# Patient Record
Sex: Female | Born: 1970 | Race: White | Hispanic: No | Marital: Single | Smoking: Former smoker
Health system: Southern US, Community
[De-identification: ages and names within clinical notes are randomized; demographics above are authoritative.]

## PROBLEM LIST (undated history)

## (undated) DIAGNOSIS — D649 Anemia, unspecified: Secondary | ICD-10-CM

## (undated) DIAGNOSIS — F329 Major depressive disorder, single episode, unspecified: Secondary | ICD-10-CM

## (undated) DIAGNOSIS — M1990 Unspecified osteoarthritis, unspecified site: Secondary | ICD-10-CM

## (undated) DIAGNOSIS — M549 Dorsalgia, unspecified: Secondary | ICD-10-CM

## (undated) DIAGNOSIS — R51 Headache: Secondary | ICD-10-CM

## (undated) DIAGNOSIS — K5090 Crohn's disease, unspecified, without complications: Secondary | ICD-10-CM

## (undated) DIAGNOSIS — G40909 Epilepsy, unspecified, not intractable, without status epilepticus: Secondary | ICD-10-CM

## (undated) DIAGNOSIS — J189 Pneumonia, unspecified organism: Secondary | ICD-10-CM

## (undated) DIAGNOSIS — F419 Anxiety disorder, unspecified: Secondary | ICD-10-CM

## (undated) DIAGNOSIS — K219 Gastro-esophageal reflux disease without esophagitis: Secondary | ICD-10-CM

## (undated) DIAGNOSIS — IMO0002 Reserved for concepts with insufficient information to code with codable children: Secondary | ICD-10-CM

## (undated) DIAGNOSIS — G8929 Other chronic pain: Secondary | ICD-10-CM

## (undated) DIAGNOSIS — E1165 Type 2 diabetes mellitus with hyperglycemia: Secondary | ICD-10-CM

## (undated) DIAGNOSIS — G43909 Migraine, unspecified, not intractable, without status migrainosus: Secondary | ICD-10-CM

## (undated) DIAGNOSIS — F32A Depression, unspecified: Secondary | ICD-10-CM

## (undated) DIAGNOSIS — G35 Multiple sclerosis: Secondary | ICD-10-CM

## (undated) DIAGNOSIS — R519 Headache, unspecified: Secondary | ICD-10-CM

## (undated) DIAGNOSIS — G35D Multiple sclerosis, unspecified: Secondary | ICD-10-CM

---

## 1991-03-03 HISTORY — PX: TUBAL LIGATION: SHX77

## 1992-03-02 HISTORY — PX: CHOLECYSTECTOMY: SHX55

## 1998-09-18 ENCOUNTER — Emergency Department (HOSPITAL_COMMUNITY): Admission: EM | Admit: 1998-09-18 | Discharge: 1998-09-18 | Payer: Self-pay | Admitting: *Deleted

## 1998-09-18 ENCOUNTER — Encounter: Payer: Self-pay | Admitting: Emergency Medicine

## 1999-09-24 ENCOUNTER — Encounter: Payer: Self-pay | Admitting: *Deleted

## 1999-09-24 ENCOUNTER — Inpatient Hospital Stay (HOSPITAL_COMMUNITY): Admission: EM | Admit: 1999-09-24 | Discharge: 1999-09-27 | Payer: Self-pay | Admitting: Emergency Medicine

## 1999-10-01 ENCOUNTER — Inpatient Hospital Stay (HOSPITAL_COMMUNITY): Admission: EM | Admit: 1999-10-01 | Discharge: 1999-10-03 | Payer: Self-pay | Admitting: Emergency Medicine

## 1999-10-02 ENCOUNTER — Encounter: Payer: Self-pay | Admitting: *Deleted

## 2001-06-10 ENCOUNTER — Inpatient Hospital Stay (HOSPITAL_COMMUNITY): Admission: AD | Admit: 2001-06-10 | Discharge: 2001-06-10 | Payer: Self-pay | Admitting: Obstetrics and Gynecology

## 2004-02-07 ENCOUNTER — Emergency Department (HOSPITAL_COMMUNITY): Admission: EM | Admit: 2004-02-07 | Discharge: 2004-02-07 | Payer: Self-pay | Admitting: *Deleted

## 2012-09-21 ENCOUNTER — Encounter (HOSPITAL_COMMUNITY): Payer: Self-pay | Admitting: Emergency Medicine

## 2012-09-21 ENCOUNTER — Emergency Department (HOSPITAL_COMMUNITY): Payer: Medicaid Other

## 2012-09-21 ENCOUNTER — Observation Stay (HOSPITAL_COMMUNITY)
Admission: EM | Admit: 2012-09-21 | Discharge: 2012-09-26 | Disposition: A | Discharge status: Home / Self Care | Payer: Medicaid Other | Attending: Internal Medicine | Admitting: Internal Medicine

## 2012-09-21 DIAGNOSIS — Z6832 Body mass index (BMI) 32.0-32.9, adult: Secondary | ICD-10-CM | POA: Insufficient documentation

## 2012-09-21 DIAGNOSIS — R0602 Shortness of breath: Secondary | ICD-10-CM | POA: Insufficient documentation

## 2012-09-21 DIAGNOSIS — Z91199 Patient's noncompliance with other medical treatment and regimen due to unspecified reason: Secondary | ICD-10-CM

## 2012-09-21 DIAGNOSIS — F17200 Nicotine dependence, unspecified, uncomplicated: Secondary | ICD-10-CM | POA: Insufficient documentation

## 2012-09-21 DIAGNOSIS — Z720 Tobacco use: Secondary | ICD-10-CM

## 2012-09-21 DIAGNOSIS — G8929 Other chronic pain: Secondary | ICD-10-CM | POA: Insufficient documentation

## 2012-09-21 DIAGNOSIS — M4800 Spinal stenosis, site unspecified: Secondary | ICD-10-CM | POA: Insufficient documentation

## 2012-09-21 DIAGNOSIS — G35 Multiple sclerosis: Secondary | ICD-10-CM | POA: Diagnosis present

## 2012-09-21 DIAGNOSIS — R0789 Other chest pain: Principal | ICD-10-CM | POA: Diagnosis present

## 2012-09-21 DIAGNOSIS — Z79899 Other long term (current) drug therapy: Secondary | ICD-10-CM | POA: Insufficient documentation

## 2012-09-21 DIAGNOSIS — E43 Unspecified severe protein-calorie malnutrition: Secondary | ICD-10-CM | POA: Insufficient documentation

## 2012-09-21 DIAGNOSIS — Z9119 Patient's noncompliance with other medical treatment and regimen: Secondary | ICD-10-CM

## 2012-09-21 DIAGNOSIS — E119 Type 2 diabetes mellitus without complications: Secondary | ICD-10-CM

## 2012-09-21 HISTORY — DX: Type 2 diabetes mellitus with hyperglycemia: E11.65

## 2012-09-21 HISTORY — DX: Multiple sclerosis: G35

## 2012-09-21 HISTORY — DX: Unspecified osteoarthritis, unspecified site: M19.90

## 2012-09-21 HISTORY — DX: Reserved for concepts with insufficient information to code with codable children: IMO0002

## 2012-09-21 HISTORY — DX: Multiple sclerosis, unspecified: G35.D

## 2012-09-21 LAB — COMPREHENSIVE METABOLIC PANEL
Albumin: 2.9 g/dL — ABNORMAL LOW (ref 3.5–5.2)
Alkaline Phosphatase: 89 U/L (ref 39–117)
BUN: 5 mg/dL — ABNORMAL LOW (ref 6–23)
CO2: 22 mEq/L (ref 19–32)
Chloride: 97 mEq/L (ref 96–112)
Creatinine, Ser: 0.39 mg/dL — ABNORMAL LOW (ref 0.50–1.10)
GFR calc Af Amer: >90 mL/min (ref >90)
GFR calc non Af Amer: >90 mL/min (ref >90)
Glucose, Bld: 322 mg/dL — ABNORMAL HIGH (ref 70–99)
Potassium: 3.5 mEq/L (ref 3.5–5.1)
Total Bilirubin: 0.3 mg/dL (ref 0.3–1.2)

## 2012-09-21 LAB — CBC
HCT: 35.1 % — ABNORMAL LOW (ref 36.0–46.0)
Hemoglobin: 11.4 g/dL — ABNORMAL LOW (ref 12.0–15.0)
MCV: 71.5 fL — ABNORMAL LOW (ref 78.0–100.0)
RDW: 16.2 % — ABNORMAL HIGH (ref 11.5–15.5)
WBC: 8.3 10*3/uL (ref 4.0–10.5)

## 2012-09-21 LAB — PRO B NATRIURETIC PEPTIDE: Pro B Natriuretic peptide (BNP): 11.2 pg/mL (ref 0–125)

## 2012-09-21 LAB — POCT I-STAT 3, ART BLOOD GAS (G3+)
Acid-Base Excess: 2 mmol/L (ref 0.0–2.0)
Bicarbonate: 26 mEq/L — ABNORMAL HIGH (ref 20.0–24.0)
Patient temperature: 37.2
TCO2: 27 mmol/L (ref 0–100)
pO2, Arterial: 113 mmHg — ABNORMAL HIGH (ref 80.0–100.0)

## 2012-09-21 LAB — D-DIMER, QUANTITATIVE: D-Dimer, Quant: 1.76 ug/mL-FEU — ABNORMAL HIGH (ref 0.00–0.48)

## 2012-09-21 LAB — POCT I-STAT TROPONIN I

## 2012-09-21 MED ORDER — ONDANSETRON HCL 4 MG/2ML IJ SOLN
4.0000 mg | Freq: Once | INTRAMUSCULAR | Status: AC
  Administered 2012-09-21: 4 mg via INTRAVENOUS
  Filled 2012-09-21: qty 2

## 2012-09-21 MED ORDER — MORPHINE SULFATE 4 MG/ML IJ SOLN
4.0000 mg | Freq: Once | INTRAMUSCULAR | Status: AC
  Administered 2012-09-21: 4 mg via INTRAVENOUS
  Filled 2012-09-21: qty 1

## 2012-09-21 MED ORDER — LORAZEPAM 2 MG/ML IJ SOLN
1.0000 mg | Freq: Once | INTRAMUSCULAR | Status: AC
  Administered 2012-09-21: 1 mg via INTRAVENOUS
  Filled 2012-09-21: qty 1

## 2012-09-21 NOTE — ED Notes (Addendum)
Per EMS pt started experiencing chest pain today around 1300. Pt reports pain that feels like tightness specifically on the left side of her chest. Pt reports numbness and tingling down her right and left arm. Pt reports feeling very SOB.  Pt reports dizziness. EMS administered 0.8 mg of nitroglycerin in total and 324 mg of aspirin. Pt states she has pain in the back of her neck and in her back left shoulder. Pt states she has a hx of seizures and that she had one last night. Pt reports having a cold the past week and that she has been coughing up green sputum. Pt reports she is not on any medications for seizures. Pt reports she has been out of Metformin for three weeks. Pt reports being newly diagnosed with MS but is not currently taking any medications for MS. Pt states she is suppose to have a spinal tap for this upcoming Monday. Pt is very weak upon assessment. Pt states she has bulging disc in her neck. Pt reports needing assistant with ADLs at home, which is new for her but has been getting worse this past month.

## 2012-09-21 NOTE — ED Provider Notes (Signed)
History    CSN: [id number] Arrival date & time 09/21/12  2015  First MD Initiated Contact with Patient 09/21/12 2059     Chief Complaint  Patient presents with  . Chest Pain  . Shortness of Breath   (Consider location/radiation/quality/duration/timing/severity/associated sxs/prior Treatment) HPI Comments: 42 y.o. Female with PMHx of recently diagnosed MS, uncontrolled seizures (pt does not take her prescribed Keppra), uncontrolled DM2 (pt does not take her prescribed glipizide), presents today complaining of left-sided chest pain and shortness of breath. This is a new problem for the patient. Acute onset today at 1pm. Pt describes pain as pressure with shooting pains at times. Pain is reproducible. 10/10. Constant. Not better or worse. Radiating down bilateral arms with associated numbness. No aggravating or alleviating factors. Pt has taken no interventions. Pt has a 10 pack year hx. Admits severe shortness of breath, productive cough, nausea, blurred vision, headache. Pain in the back of her neck also underlies a chronic condition. Denies vomiting, hx clots, hx of cancer, no estrogen use. Pt states she is getting weaker with the MS and is not as mobile as she once was. She used to ambulate well with a cane, but is finding it more difficult and often asks her fiance (at bedside) for help ambulating.   Pt called EMS who did give her 0.8 mg of nitro and 324 ASA with little relief.   Pt states she had a seizure last night, but sought no medical attention. Pt also states that she is supposed to have an MRI on Monday.   Patient is a 42 y.o. female presenting with chest pain.  Chest Pain Associated symptoms: cough, nausea, numbness, shortness of breath and weakness   Associated symptoms: no abdominal pain, no diaphoresis, no dizziness, no fever, no headache, no palpitations and not vomiting    Past Medical History  Diagnosis Date  . MS (multiple sclerosis)   . Diabetes type 2, uncontrolled    . Seizures   . Arthritis    Past Surgical History  Procedure Laterality Date  . Cholecystectomy    . Tubal ligation     No family history on file. History  Substance Use Topics  . Smoking status: Current Every Day Smoker -- 0.50 packs/day    Types: Cigarettes  . Smokeless tobacco: Not on file  . Alcohol Use: Not on file   OB History   Grav Para Term Preterm Abortions TAB SAB Ect Mult Living                 Review of Systems  Constitutional: Negative for fever and diaphoresis.  HENT: Positive for neck pain. Negative for congestion, rhinorrhea and neck stiffness.        Chronic cervical spine pain  Eyes: Positive for visual disturbance.       Blurred vision  Respiratory: Positive for cough and shortness of breath. Negative for apnea, chest tightness, wheezing and stridor.        Productive cough with green sputum for the last week  Cardiovascular: Positive for chest pain. Negative for palpitations and leg swelling.       Left sided, reproducible   Gastrointestinal: Positive for nausea. Negative for vomiting, abdominal pain, diarrhea and constipation.  Genitourinary: Negative for dysuria and pelvic pain.  Musculoskeletal: Positive for myalgias, arthralgias and gait problem.       Recently diagnosed with MS  Skin: Negative for rash.  Neurological: Positive for seizures, weakness and numbness. Negative for dizziness, syncope, light-headedness and headaches.  Seizure last night, increasing generalized weakness, numbness down bilateral arms  Psychiatric/Behavioral: The patient is nervous/anxious.     Allergies  Codeine; Peanut-containing drug products; and Penicillins  Home Medications   Current Outpatient Rx  Name  Route  Sig  Dispense  Refill  . acetaminophen (TYLENOL) 500 MG tablet   Oral   Take 1,000 mg by mouth every 6 (six) hours.         . Aspirin-Acetaminophen-Caffeine (GOODY HEADACHE PO)   Oral   Take 1-2 packets by mouth every 6 (six) hours.           Marland Kitchen glipiZIDE (GLUCOTROL) 5 MG tablet   Oral   Take 5 mg by mouth 2 (two) times daily before a meal.          BP 126/83  Temp(Src) 99 F (37.2 C) (Oral)  Resp 20  SpO2 98%  LMP 09/21/2012 Physical Exam  Nursing note and vitals reviewed. Constitutional: She is oriented to person, place, and time. No distress.  Ill appearing  HENT:  Head: Normocephalic and atraumatic.  Eyes: Conjunctivae and EOM are normal.  Neck: Normal range of motion. Neck supple.  No meningeal signs  Cardiovascular: Normal rate, regular rhythm and normal heart sounds.  Exam reveals no gallop and no friction rub.   No murmur heard. Pulmonary/Chest: Effort normal and breath sounds normal. No respiratory distress. She has no wheezes. She has no rales. She exhibits no tenderness.  Abdominal: Soft. Bowel sounds are normal. She exhibits no distension. There is no tenderness. There is no rebound and no guarding.  Musculoskeletal: Normal range of motion. She exhibits no edema and no tenderness.  FROM to upper and lower extremities No step-offs noted on C-spine No tenderness to palpation of the spinous processes of the C-spine, T-spine or L-spine Full range of motion of C-spine, T-spine or L-spine Mild tenderness to palpation of the C-spine paraspinous muscles   Neurological: She is alert and oriented to person, place, and time. No cranial nerve deficit.  Speech is clear and goal oriented, follows commands Sensation normal to light touch and two point discrimination Increasingly unsteady on her feet, difficulty ambulating Decreasing strength in upper and lower extremities bilaterally including dorsiflexion and plantar flexion, decreased grip strength and tremoring   Skin: Skin is warm and dry. She is not diaphoretic. No erythema.  Psychiatric:  anxious    ED Course  Procedures (including critical care time) Date: 09/21/2012  Rate: 84  Rhythm: normal sinus rhythm  QRS Axis: left  Intervals: normal  QRS:  Poor R wave progression in lateral precordial lead.  ST/T Wave abnormalities: nonspecific T wave changes  Conduction Disutrbances:none  Narrative Interpretation: Abnormal EKG  Old EKG Reviewed: unchanged  Labs Reviewed  CBC - Abnormal; Notable for the following:    Hemoglobin 11.4 (*)    HCT 35.1 (*)    MCV 71.5 (*)    MCH 23.2 (*)    RDW 16.2 (*)    All other components within normal limits  COMPREHENSIVE METABOLIC PANEL - Abnormal; Notable for the following:    Sodium 132 (*)    Glucose, Bld 322 (*)    BUN 5 (*)    Creatinine, Ser 0.39 (*)    Albumin 2.9 (*)    All other components within normal limits  D-DIMER, QUANTITATIVE - Abnormal; Notable for the following:    D-Dimer, Quant 1.76 (*)    All other components within normal limits  POCT I-STAT 3, BLOOD GAS (G3+) - Abnormal;  Notable for the following:    pO2, Arterial 113.0 (*)    Bicarbonate 26.0 (*)    All other components within normal limits  PRO B NATRIURETIC PEPTIDE  BLOOD GAS, ARTERIAL  URINALYSIS, ROUTINE W REFLEX MICROSCOPIC  POCT I-STAT TROPONIN I   Dg Chest 2 View  09/21/2012   *RADIOLOGY REPORT*  Clinical Data: Left-sided chest pain and shortness of breath.  CHEST - 2 VIEW  Comparison: Two-view chest 08/24/2012.  Findings: The heart size is normal.  The lungs are clear.  The visualized soft tissues and bony thorax are unremarkable.  IMPRESSION: Negative two-view chest.   Original Report Authenticated By: San Morelle, M.D.   Ct Angio Chest Pe W/cm &/or Wo Cm  09/22/2012   *RADIOLOGY REPORT*  Clinical Data: Chest pain.  Shortness of breath.  Elevated D-dimer. Numbness and tingling in the arms.  CT ANGIOGRAPHY CHEST  Technique:  Multidetector CT imaging of the chest using the standard protocol during bolus administration of intravenous contrast. Multiplanar reconstructed images including MIPs were obtained and reviewed to evaluate the vascular anatomy.  Contrast: 153m OMNIPAQUE IOHEXOL 350 MG/ML SOLN   Comparison: 07/11/2003  Findings: Technically adequate study with good opacification of the central and segmental pulmonary arteries.  No focal filling defects are demonstrated.  No evidence of significant pulmonary embolus.  Normal heart size.  Normal caliber thoracic aorta.  No aortic dissection.  No significant lymphadenopathy in the chest. Esophagus is decompressed.  Thyroid gland appears homogeneous.  No pleural effusion.  Visualized portions of the upper abdominal organs are grossly unremarkable.  No focal airspace disease or consolidation in the lungs.  Minimal dependent atelectasis.  No significant interstitial changes.  No pneumothorax.  Airways appear patent.  Mild degenerative changes in the thoracic spine.  No destructive bone lesions are appreciated. Vertebral hemangioma at T8.  IMPRESSION: No evidence of significant pulmonary embolus.  No evidence of active pulmonary disease.   Original Report Authenticated By: WLucienne Capers M.D.   No diagnosis found.  MDM  Suspicion for PE vs. ACS. Lower suspicion for pneumonia based on pt presentation. Lungs CTA. Discussed pt case with and seen by Dr. DMonia Pouchas well. Will order ABG, D dimer, troponin, CBC, CMP. Will manage pain and provide O2 therapy. Will dose with Keppra for seizure prophylaxsis.   Labs resulted for glucose of 336 (will dose glipizide), H/H 11.4/35.1, d dimer of 1.76, PO2 ART 113, Bicarb 26. Discussed results with Dr. DMonia Pouchwho agrees to CT angio.   Chest pain not resolving. SOB resolving with O2 therapy. CT angio negative for PE. Discussed with Dr. DMonia Pouchwho agrees to admit for cardiac rule out.   Dr. LTruman Haywardsaw the pt and will admit the pt.     DX: chest pain, dyspnea, hyperglycemia.  BCoralee North PA-C 09/22/12 [place] PA-C 09/22/12 [phone]

## 2012-09-21 NOTE — ED Notes (Signed)
CBC and BMP discontinued as they are duplicate orders.

## 2012-09-21 NOTE — ED Notes (Signed)
Phlebotomy at bedside.

## 2012-09-21 NOTE — ED Notes (Signed)
Pt on bedpan attempting to urinate.

## 2012-09-21 NOTE — ED Notes (Signed)
Phlebotomy contacted inquiring about blood that needs to be drawn for a d-dimer. Phlebotomy en route.

## 2012-09-21 NOTE — ED Provider Notes (Signed)
8:40 PM  Date: 09/21/2012  Rate: 84  Rhythm: normal sinus rhythm  QRS Axis: left  Intervals: normal QRS:  Poor R wave progression in lateral precordial lead.  ST/T Wave abnormalities: nonspecific T wave changes  Conduction Disutrbances:none  Narrative Interpretation: Abnormal EKG  Old EKG Reviewed: unchanged    Mylinda Latina III, MD 09/21/12 2043

## 2012-09-21 NOTE — ED Notes (Signed)
Pt unable to void at this time. 

## 2012-09-21 NOTE — ED Notes (Signed)
Pt reports she is very nauseous

## 2012-09-21 NOTE — ED Notes (Signed)
PA at bedside.

## 2012-09-21 NOTE — ED Notes (Signed)
Respiratory consulted, aware of blood gas to be drawn.

## 2012-09-22 ENCOUNTER — Emergency Department (HOSPITAL_COMMUNITY): Payer: Medicaid Other

## 2012-09-22 ENCOUNTER — Encounter (HOSPITAL_COMMUNITY): Payer: Self-pay | Admitting: Radiology

## 2012-09-22 DIAGNOSIS — E43 Unspecified severe protein-calorie malnutrition: Secondary | ICD-10-CM | POA: Insufficient documentation

## 2012-09-22 DIAGNOSIS — E119 Type 2 diabetes mellitus without complications: Secondary | ICD-10-CM

## 2012-09-22 DIAGNOSIS — G35 Multiple sclerosis: Secondary | ICD-10-CM | POA: Diagnosis present

## 2012-09-22 DIAGNOSIS — F17200 Nicotine dependence, unspecified, uncomplicated: Secondary | ICD-10-CM

## 2012-09-22 DIAGNOSIS — G35D Multiple sclerosis, unspecified: Secondary | ICD-10-CM | POA: Diagnosis present

## 2012-09-22 DIAGNOSIS — R0789 Other chest pain: Secondary | ICD-10-CM | POA: Diagnosis present

## 2012-09-22 DIAGNOSIS — Z9119 Patient's noncompliance with other medical treatment and regimen: Secondary | ICD-10-CM

## 2012-09-22 DIAGNOSIS — Z720 Tobacco use: Secondary | ICD-10-CM | POA: Diagnosis present

## 2012-09-22 LAB — GLUCOSE, CAPILLARY: Glucose-Capillary: 202 mg/dL — ABNORMAL HIGH (ref 70–99)

## 2012-09-22 LAB — URINALYSIS, ROUTINE W REFLEX MICROSCOPIC
Bilirubin Urine: NEGATIVE
Hgb urine dipstick: NEGATIVE
Ketones, ur: NEGATIVE mg/dL
Nitrite: NEGATIVE
Protein, ur: NEGATIVE mg/dL
Urobilinogen, UA: 0.2 mg/dL (ref 0.0–1.0)

## 2012-09-22 LAB — PROTIME-INR
INR: 1.03 (ref 0.00–1.49)
Prothrombin Time: 13.3 seconds (ref 11.6–15.2)

## 2012-09-22 LAB — IRON AND TIBC
Saturation Ratios: 8 % — ABNORMAL LOW (ref 20–55)
TIBC: 300 ug/dL (ref 250–470)
UIBC: 275 ug/dL (ref 125–400)

## 2012-09-22 LAB — ACETAMINOPHEN LEVEL: Acetaminophen (Tylenol), Serum: <15 ug/mL (ref 10–30)

## 2012-09-22 LAB — FOLATE: Folate: 13.2 ng/mL

## 2012-09-22 LAB — PREGNANCY, URINE: Preg Test, Ur: NEGATIVE

## 2012-09-22 LAB — RETICULOCYTES
RBC.: 4.88 MIL/uL (ref 3.87–5.11)
Retic Ct Pct: 1.1 % (ref 0.4–3.1)

## 2012-09-22 LAB — TROPONIN I
Troponin I: <0.3 ng/mL (ref <0.30)
Troponin I: <0.3 ng/mL (ref <0.30)
Troponin I: <0.3 ng/mL (ref <0.30)

## 2012-09-22 MED ORDER — IOHEXOL 350 MG/ML SOLN
100.0000 mL | Freq: Once | INTRAVENOUS | Status: AC | PRN
  Administered 2012-09-22: 100 mL via INTRAVENOUS

## 2012-09-22 MED ORDER — DOCUSATE SODIUM 100 MG PO CAPS
100.0000 mg | ORAL_CAPSULE | Freq: Two times a day (BID) | ORAL | Status: DC
  Administered 2012-09-25: 100 mg via ORAL
  Filled 2012-09-22 (×9): qty 1

## 2012-09-22 MED ORDER — LEVETIRACETAM 500 MG PO TABS
500.0000 mg | ORAL_TABLET | Freq: Once | ORAL | Status: AC
  Administered 2012-09-22: 500 mg via ORAL
  Filled 2012-09-22: qty 1

## 2012-09-22 MED ORDER — SODIUM CHLORIDE 0.9 % IV BOLUS (SEPSIS)
1000.0000 mL | Freq: Once | INTRAVENOUS | Status: AC
  Administered 2012-09-22: 1000 mL via INTRAVENOUS

## 2012-09-22 MED ORDER — ASPIRIN EC 325 MG PO TBEC
325.0000 mg | DELAYED_RELEASE_TABLET | Freq: Every day | ORAL | Status: DC
  Administered 2012-09-22: 325 mg via ORAL
  Filled 2012-09-22 (×2): qty 1

## 2012-09-22 MED ORDER — GLIPIZIDE 5 MG PO TABS
5.0000 mg | ORAL_TABLET | ORAL | Status: AC
  Administered 2012-09-22: 5 mg via ORAL
  Filled 2012-09-22: qty 1

## 2012-09-22 MED ORDER — INSULIN ASPART 100 UNIT/ML ~~LOC~~ SOLN
0.0000 [IU] | Freq: Three times a day (TID) | SUBCUTANEOUS | Status: DC
  Administered 2012-09-22: 3 [IU] via SUBCUTANEOUS
  Administered 2012-09-22: 5 [IU] via SUBCUTANEOUS
  Administered 2012-09-22 – 2012-09-23 (×3): 3 [IU] via SUBCUTANEOUS
  Administered 2012-09-23: 2 [IU] via SUBCUTANEOUS
  Administered 2012-09-24 (×2): 3 [IU] via SUBCUTANEOUS
  Administered 2012-09-24 – 2012-09-26 (×3): 2 [IU] via SUBCUTANEOUS

## 2012-09-22 MED ORDER — PROMETHAZINE HCL 25 MG/ML IJ SOLN
25.0000 mg | Freq: Once | INTRAMUSCULAR | Status: AC
  Administered 2012-09-22: 25 mg via INTRAVENOUS
  Filled 2012-09-22: qty 1

## 2012-09-22 MED ORDER — GLIPIZIDE 5 MG PO TABS
5.0000 mg | ORAL_TABLET | Freq: Two times a day (BID) | ORAL | Status: DC
  Administered 2012-09-22 – 2012-09-26 (×9): 5 mg via ORAL
  Filled 2012-09-22 (×12): qty 1

## 2012-09-22 MED ORDER — ENOXAPARIN SODIUM 40 MG/0.4ML ~~LOC~~ SOLN
40.0000 mg | SUBCUTANEOUS | Status: DC
  Administered 2012-09-22: 40 mg via SUBCUTANEOUS
  Filled 2012-09-22: qty 0.4

## 2012-09-22 MED ORDER — GLUCERNA SHAKE PO LIQD
237.0000 mL | Freq: Two times a day (BID) | ORAL | Status: DC
  Administered 2012-09-22 – 2012-09-23 (×2): 237 mL via ORAL

## 2012-09-22 MED ORDER — KETOROLAC TROMETHAMINE 30 MG/ML IJ SOLN
15.0000 mg | Freq: Four times a day (QID) | INTRAMUSCULAR | Status: DC | PRN
  Administered 2012-09-22 – 2012-09-24 (×5): 15 mg via INTRAVENOUS
  Filled 2012-09-22 (×7): qty 1

## 2012-09-22 MED ORDER — LEVETIRACETAM 500 MG PO TABS: 500.0000 mg | ORAL_TABLET | Freq: Every day | ORAL | Status: DC

## 2012-09-22 MED ORDER — ONDANSETRON HCL 4 MG/2ML IJ SOLN
4.0000 mg | Freq: Four times a day (QID) | INTRAMUSCULAR | Status: DC | PRN
  Administered 2012-09-22: 4 mg via INTRAVENOUS

## 2012-09-22 MED ORDER — PROMETHAZINE HCL 25 MG/ML IJ SOLN
12.5000 mg | INTRAMUSCULAR | Status: DC | PRN
  Administered 2012-09-22 – 2012-09-24 (×6): 12.5 mg via INTRAVENOUS
  Filled 2012-09-22 (×5): qty 1

## 2012-09-22 MED ORDER — ONDANSETRON HCL 4 MG PO TABS: 4.0000 mg | ORAL_TABLET | Freq: Four times a day (QID) | ORAL | Status: DC | PRN

## 2012-09-22 MED ORDER — LEVETIRACETAM 500 MG PO TABS
500.0000 mg | ORAL_TABLET | Freq: Every day | ORAL | Status: DC
  Administered 2012-09-23 – 2012-09-26 (×4): 500 mg via ORAL
  Filled 2012-09-22 (×4): qty 1

## 2012-09-22 MED ORDER — INSULIN ASPART 100 UNIT/ML ~~LOC~~ SOLN: 0.0000 [IU] | Freq: Every day | SUBCUTANEOUS | Status: DC

## 2012-09-22 MED ORDER — MORPHINE SULFATE 2 MG/ML IJ SOLN
2.0000 mg | INTRAMUSCULAR | Status: DC | PRN
  Administered 2012-09-22 – 2012-09-23 (×7): 2 mg via INTRAVENOUS
  Filled 2012-09-22 (×7): qty 1

## 2012-09-22 MED ORDER — MORPHINE SULFATE 4 MG/ML IJ SOLN
4.0000 mg | Freq: Once | INTRAMUSCULAR | Status: AC
  Administered 2012-09-22: 4 mg via INTRAVENOUS
  Filled 2012-09-22: qty 1

## 2012-09-22 MED ORDER — SODIUM CHLORIDE 0.9 % IJ SOLN
3.0000 mL | Freq: Two times a day (BID) | INTRAMUSCULAR | Status: DC
  Administered 2012-09-22 – 2012-09-25 (×3): 3 mL via INTRAVENOUS

## 2012-09-22 NOTE — Progress Notes (Signed)
[facility] PROGRESS NOTE  Yvonda Fouty OMV:[id number] DOB: [birth year] DOA: 09/21/2012 PCP: Imagene Riches, NP  Assessment/Plan: 1. Atypical chest pain: tenderness on palpation over the sternum, shoulders and legs.  - cardiac enzymes negative,  - ekg does not show any st t wave changes.   2. Multiple sclerosis: as per the patient, she was recently diagnosed with MS based on MRI brain. LP in am for diagnostic purposes.   3. DM: on SSI.   4. dvt prophylaxis.   Code Status: full code Family Communication: none at bedside.  Disposition Plan: pending    Consultants:  none  Procedures:  none  Antibiotics:  none  HPI/Subjective: Reports generalized pain all over.   Objective: Filed Vitals:   09/22/12 0215 09/22/12 0348 09/22/12 0349 09/22/12 1449  BP: 137/76  140/77 140/79  Pulse: 73  70 106  Temp:   98.5 F (36.9 C) 98.2 F (36.8 C)  TempSrc:   Oral Oral  Resp: 20  20 20   Height:   5' 8"  (1.727 m)   Weight:   97 kg (213 lb 13.5 oz)   SpO2: 96% 99% 99% 95%    Intake/Output Summary (Last 24 hours) at 09/22/12 1938 Last data filed at 09/22/12 1300  Gross per 24 hour  Intake    480 ml  Output      1 ml  Net    479 ml   Filed Weights   09/22/12 0349  Weight: 97 kg (213 lb 13.5 oz)    Exam:   General:  Alert afebrile comfortable  Cardiovascular: s1s2   Respiratory: ctab  Abdomen: soft NT ND BS+  Musculoskeletal: no pedal edema.   Data Reviewed: Basic Metabolic Panel:  Recent Labs Lab 09/21/12 2041  NA 132*  K 3.5  CL 97  CO2 22  GLUCOSE 322*  BUN 5*  CREATININE 0.39*  CALCIUM 9.1   Liver Function Tests:  Recent Labs Lab 09/21/12 2041  AST 21  ALT 10  ALKPHOS 89  BILITOT 0.3  PROT 8.0  ALBUMIN 2.9*   No results found for this basename: LIPASE, AMYLASE,  in the last 168 hours No results found for this basename: AMMONIA,  in the last 168 hours CBC:  Recent Labs Lab 09/21/12 2041  WBC 8.3  HGB 11.4*  HCT 35.1*  MCV  71.5*  PLT 399   Cardiac Enzymes:  Recent Labs Lab 09/22/12 0220 09/22/12 1005 09/22/12 1519  TROPONINI <0.30 <0.30 <0.30   BNP (last 3 results)  Recent Labs  09/21/12 2042  PROBNP 11.2   CBG:  Recent Labs Lab 09/22/12 0605 09/22/12 1121 09/22/12 1632  GLUCAP 173* 161* 202*    No results found for this or any previous visit (from the past 240 hour(s)).   Studies: Dg Chest 2 View  09/21/2012   *RADIOLOGY REPORT*  Clinical Data: Left-sided chest pain and shortness of breath.  CHEST - 2 VIEW  Comparison: Two-view chest 08/24/2012.  Findings: The heart size is normal.  The lungs are clear.  The visualized soft tissues and bony thorax are unremarkable.  IMPRESSION: Negative two-view chest.   Original Report Authenticated By: San Morelle, M.D.   Ct Angio Chest Pe W/cm &/or Wo Cm  09/22/2012   *RADIOLOGY REPORT*  Clinical Data: Chest pain.  Shortness of breath.  Elevated D-dimer. Numbness and tingling in the arms.  CT ANGIOGRAPHY CHEST  Technique:  Multidetector CT imaging of the chest using the standard protocol during bolus administration of intravenous contrast. Multiplanar  reconstructed images including MIPs were obtained and reviewed to evaluate the vascular anatomy.  Contrast: 159m OMNIPAQUE IOHEXOL 350 MG/ML SOLN  Comparison: 07/11/2003  Findings: Technically adequate study with good opacification of the central and segmental pulmonary arteries.  No focal filling defects are demonstrated.  No evidence of significant pulmonary embolus.  Normal heart size.  Normal caliber thoracic aorta.  No aortic dissection.  No significant lymphadenopathy in the chest. Esophagus is decompressed.  Thyroid gland appears homogeneous.  No pleural effusion.  Visualized portions of the upper abdominal organs are grossly unremarkable.  No focal airspace disease or consolidation in the lungs.  Minimal dependent atelectasis.  No significant interstitial changes.  No pneumothorax.  Airways appear  patent.  Mild degenerative changes in the thoracic spine.  No destructive bone lesions are appreciated. Vertebral hemangioma at T8.  IMPRESSION: No evidence of significant pulmonary embolus.  No evidence of active pulmonary disease.   Original Report Authenticated By: WLucienne Capers M.D.    Scheduled Meds: . aspirin EC  325 mg Oral Daily  . docusate sodium  100 mg Oral BID  . feeding supplement  237 mL Oral BID BM  . glipiZIDE  5 mg Oral BID AC  . insulin aspart  0-15 Units Subcutaneous TID WC  . insulin aspart  0-5 Units Subcutaneous QHS  . [START ON 09/23/2012] levETIRAcetam  500 mg Oral Daily  . sodium chloride  3 mL Intravenous Q12H   Continuous Infusions:   Principal Problem:   Atypical chest pain Active Problems:   Multiple sclerosis   Noncompliance   Tobacco abuse   DM2 (diabetes mellitus, type 2)   Protein-calorie malnutrition, severe    Time spent: 20 min    Jakeim Sedore  [facility] Pager [phone] If 7PM-7AM, please contact night-coverage at [email or web address], password [email or web address]7/24/2014, 7:38 PM  LOS: 1 day

## 2012-09-22 NOTE — H&P (Signed)
[facility] History and Physical  Andrea Wade YOV:[id number] DOB: November 03, 1970    PCP:   Imagene Riches, NP   Chief Complaint: chest pain.  HPI: Andrea Wade is an 42 y.o. female with hx of DM, poor compliance with meds due to lack of insurance, hx of seizure not on ACD due to cost contraint, recently Dx with MS though she still need LP to confirm, not on any meds for her presumped MS, presents to the ER with an episode of substernal CP.  She has no SOB, fever or chills, nausea or vomiting.  Evaluation in the ER included an EKG which shows no ischemic nor injurious ST segments, clear CXR, and negative troponin.  She is having slight chest discomfort which was palpable in the ER.  She also had an elevated D-dimer, with subsequent negative CTPA.  Hospitalist was asked to admit her for atypical chest pain.   Rewiew of Systems:  Constitutional: Negative for malaise, fever and chills. No significant weight loss or weight gain Eyes: Negative for eye pain, redness and discharge, diplopia, visual changes, or flashes of light. ENMT: Negative for ear pain, hoarseness, nasal congestion, sinus pressure and sore throat. No headaches; tinnitus, drooling, or problem swallowing. Cardiovascular: Negative for palpitations, diaphoresis, dyspnea and peripheral edema. ; No orthopnea, PND Respiratory: Negative for cough, hemoptysis, wheezing and stridor. No pleuritic chestpain. Gastrointestinal: Negative for nausea, vomiting, diarrhea, constipation, abdominal pain, melena, blood in stool, hematemesis, jaundice and rectal bleeding.    Genitourinary: Negative for frequency, dysuria, incontinence,flank pain and hematuria; Musculoskeletal: Negative for back pain and neck pain. Negative for swelling and trauma.;  Skin: . Negative for pruritus, rash, abrasions, bruising and skin lesion.; ulcerations Neuro: Negative for headache, lightheadedness and neck stiffness. Negative for weakness, altered level of  consciousness , altered mental status,  burning feet, involuntary movement, seizure and syncope.  Psych: negative for anxiety, depression, insomnia, tearfulness, panic attacks, hallucinations, paranoia, suicidal or homicidal ideation    Past Medical History  Diagnosis Date  . MS (multiple sclerosis)   . Diabetes type 2, uncontrolled   . Seizures   . Arthritis     Past Surgical History  Procedure Laterality Date  . Cholecystectomy    . Tubal ligation      Medications:  HOME MEDS: Prior to Admission medications   Medication Sig Start Date End Date Taking? Authorizing Provider  acetaminophen (TYLENOL) 500 MG tablet Take 1,000 mg by mouth every 6 (six) hours.   Yes Historical Provider, MD  Aspirin-Acetaminophen-Caffeine (GOODY HEADACHE PO) Take 1-2 packets by mouth every 6 (six) hours.    Yes Historical Provider, MD  glipiZIDE (GLUCOTROL) 5 MG tablet Take 5 mg by mouth 2 (two) times daily before a meal.   Yes Historical Provider, MD     Allergies:  Allergies  Allergen Reactions  . Codeine Hives, Itching and Palpitations  . Peanut-Containing Drug Products Anaphylaxis  . Penicillins Hives, Itching and Nausea And Vomiting    Social History:   reports that she has been smoking Cigarettes.  She has been smoking about 0.50 packs per day. She does not have any smokeless tobacco history on file. Her alcohol and drug histories are not on file.  Family History: No family history on file.   Physical Exam: Filed Vitals:   09/22/12 0015 09/22/12 0104 09/22/12 0145 09/22/12 0215  BP: 142/84 137/79 140/84 137/76  Pulse: 75 77 80 73  Temp:      TempSrc:      Resp: 16 20 18  20  SpO2: 99% 98% 99% 96%   Blood pressure 137/76, pulse 73, temperature 99 F (37.2 C), temperature source Oral, resp. rate 20, last menstrual period 09/21/2012, SpO2 96.00%.  GEN:  Pleasant  patient lying in the stretcher in no acute distress; cooperative with exam. PSYCH:  alert and oriented x4; does not  appear anxious or depressed; affect is appropriate. HEENT: Mucous membranes pink and anicteric; PERRLA; EOM intact; no cervical lymphadenopathy nor thyromegaly or carotid bruit; no JVD; There were no stridor. Neck is very supple. Breasts:: Not examined CHEST WALL: She has bilateral chest wall tenderness at the costal sternal Jx bilaterally. CHEST: Normal respiration, clear to auscultation bilaterally.  HEART: Regular rate and rhythm.  There are no murmur, rub, or gallops.   BACK: No kyphosis or scoliosis; no CVA tenderness ABDOMEN: soft and non-tender; no masses, no organomegaly, normal abdominal bowel sounds; no pannus; no intertriginous candida. There is no rebound and no distention. Rectal Exam: Not done EXTREMITIES: No bone or joint deformity; age-appropriate arthropathy of the hands and knees; no edema; no ulcerations.  There is no calf tenderness. Genitalia: not examined PULSES: 2+ and symmetric SKIN: Normal hydration no rash or ulceration CNS: Cranial nerves 2-12 grossly intact no focal lateralizing neurologic deficit.  Speech is fluent; uvula elevated with phonation, facial symmetry and tongue midline. DTR are normal bilaterally, cerebella exam is intact, barbinski is negative and severe lower extremity paresis.  No sensory loss.   Labs on Admission:  Basic Metabolic Panel:  Recent Labs Lab 09/21/12 2041  NA 132*  K 3.5  CL 97  CO2 22  GLUCOSE 322*  BUN 5*  CREATININE 0.39*  CALCIUM 9.1   Liver Function Tests:  Recent Labs Lab 09/21/12 2041  AST 21  ALT 10  ALKPHOS 89  BILITOT 0.3  PROT 8.0  ALBUMIN 2.9*   No results found for this basename: LIPASE, AMYLASE,  in the last 168 hours No results found for this basename: AMMONIA,  in the last 168 hours CBC:  Recent Labs Lab 09/21/12 2041  WBC 8.3  HGB 11.4*  HCT 35.1*  MCV 71.5*  PLT 399   Cardiac Enzymes: No results found for this basename: CKTOTAL, CKMB, CKMBINDEX, TROPONINI,  in the last 168  hours  CBG: No results found for this basename: GLUCAP,  in the last 168 hours   Radiological Exams on Admission: Dg Chest 2 View  09/21/2012   *RADIOLOGY REPORT*  Clinical Data: Left-sided chest pain and shortness of breath.  CHEST - 2 VIEW  Comparison: Two-view chest 08/24/2012.  Findings: The heart size is normal.  The lungs are clear.  The visualized soft tissues and bony thorax are unremarkable.  IMPRESSION: Negative two-view chest.   Original Report Authenticated By: San Morelle, M.D.   Ct Angio Chest Pe W/cm &/or Wo Cm  09/22/2012   *RADIOLOGY REPORT*  Clinical Data: Chest pain.  Shortness of breath.  Elevated D-dimer. Numbness and tingling in the arms.  CT ANGIOGRAPHY CHEST  Technique:  Multidetector CT imaging of the chest using the standard protocol during bolus administration of intravenous contrast. Multiplanar reconstructed images including MIPs were obtained and reviewed to evaluate the vascular anatomy.  Contrast: 148m OMNIPAQUE IOHEXOL 350 MG/ML SOLN  Comparison: 07/11/2003  Findings: Technically adequate study with good opacification of the central and segmental pulmonary arteries.  No focal filling defects are demonstrated.  No evidence of significant pulmonary embolus.  Normal heart size.  Normal caliber thoracic aorta.  No aortic dissection.  No significant lymphadenopathy in the chest. Esophagus is decompressed.  Thyroid gland appears homogeneous.  No pleural effusion.  Visualized portions of the upper abdominal organs are grossly unremarkable.  No focal airspace disease or consolidation in the lungs.  Minimal dependent atelectasis.  No significant interstitial changes.  No pneumothorax.  Airways appear patent.  Mild degenerative changes in the thoracic spine.  No destructive bone lesions are appreciated. Vertebral hemangioma at T8.  IMPRESSION: No evidence of significant pulmonary embolus.  No evidence of active pulmonary disease.   Original Report Authenticated By: Lucienne Capers, M.D.    EKG: Independently reviewed. Axis is about -40 degree, SR, no acute ST-T changes.   Assessment/Plan Present on Admission:  . Atypical chest pain . Multiple sclerosis . Tobacco abuse  PLAN:  Atypical chest pain, admitted for r/out.  I suspect she has chest wall pain.  Will cycle her troponin and obtain cardiac echo.   I will start her on ASA.  It was reported that has been taking over 4 grams of tylenol per day, so will get tylenol level, but her LFTs were all normal, so I suspect she doesn't have liver toxicity.   For her MS, consider getting an LP to finish her MS work up so she can start medication.  Will leave this up to the day team.  She is stable, full code, and will be admitted to [facility] service.   Other plans as per orders.  Code Status: FULL Haskel Khan, MD. [facility] Pager [phone] 7pm to 7am.  09/22/2012, 3:44 AM

## 2012-09-22 NOTE — Progress Notes (Signed)
Pt given 67m IV Zofran for complaints of nausea at this time; will cont. To monitor.

## 2012-09-22 NOTE — Progress Notes (Signed)
INITIAL NUTRITION ASSESSMENT  DOCUMENTATION CODES Per approved criteria  -Severe malnutrition in the context of acute illness or injury   INTERVENTION:  Glucerna Shake PO BID, each supplement provides 220 kcal and 10 grams of protein.  NUTRITION DIAGNOSIS: Malnutrition related to altered GI function with inadequate intake as evidenced by 3% weight loss in the past week and intake </= 50% of estimated energy requirement for >/= 5 days.   Goal: Intake to meet >90% of estimated nutrition needs.  Monitor:  PO intake, diet tolerance, labs, weight trend.  Reason for Assessment: MST=5  42 y.o. female  Admitting Dx: Atypical chest pain  ASSESSMENT: Patient admitted to the hospital for substernal chest pain. Recently diagnosed with MS, needs LP to confirm diagnosis. Patient c/o abdominal pain. Unable to eat more than a few bites at breakfast today. Patient reports that she has Crohn's Disease and has trouble tolerating a lot of foods. She also states that her DM pill causes her Crohn's to "act up." She c/o poor appetite and minimal intake for the past week. Has lost 3% of usual weight in the past week. Agreeable to try Glucerna Shake to help improve intake.   Nutrition Focused Physical Exam:  Subcutaneous Fat:  Orbital Region: WNL Upper Arm Region: NA Thoracic and Lumbar Region: NA  Muscle:  Temple Region: mild-moderate depletion Clavicle Bone Region: mild-moderate depletion Clavicle and Acromion Bone Region: WNL Scapular Bone Region: NA Dorsal Hand: WNL Patellar Region: WNL Anterior Thigh Region: WNL Posterior Calf Region: WNL  Edema: none  Pt meets criteria for severe MALNUTRITION in the context of acute illness as evidenced by 3% weight loss in the past week and intake </= 50% of estimated energy requirement for >/= 5 days.   Height: Ht Readings from Last 1 Encounters:  09/22/12 5' 8"  (1.727 m)    Weight: Wt Readings from Last 1 Encounters:  09/22/12 213 lb 13.5 oz  (97 kg)    Ideal Body Weight: 63.6 kg  % Ideal Body Weight: 153%  Wt Readings from Last 10 Encounters:  09/22/12 213 lb 13.5 oz (97 kg)    Usual Body Weight: 219 lb (1 week ago)  % Usual Body Weight: 97%  BMI:  Body mass index is 32.52 kg/(m^2). class 1 obesity  Estimated Nutritional Needs: Kcal: 1800-1950 Protein: 95-110 gm Fluid: 1.8-2 L  Skin: no problems  Diet Order: Carb Control  EDUCATION NEEDS: -Education not appropriate at this time   Intake/Output Summary (Last 24 hours) at 09/22/12 0950 Last data filed at 09/22/12 0900  Gross per 24 hour  Intake    240 ml  Output      0 ml  Net    240 ml    Last BM: 7/23   Labs:   Recent Labs Lab 09/21/12 2041  NA 132*  K 3.5  CL 97  CO2 22  BUN 5*  CREATININE 0.39*  CALCIUM 9.1  GLUCOSE 322*    CBG (last 3)   Recent Labs  09/22/12 0605  GLUCAP 173*    Scheduled Meds: . aspirin EC  325 mg Oral Daily  . docusate sodium  100 mg Oral BID  . enoxaparin (LOVENOX) injection  40 mg Subcutaneous Q24H  . glipiZIDE  5 mg Oral BID AC  . insulin aspart  0-15 Units Subcutaneous TID WC  . insulin aspart  0-5 Units Subcutaneous QHS  . sodium chloride  3 mL Intravenous Q12H    Continuous Infusions:   Past Medical History  Diagnosis  Date  . MS (multiple sclerosis)   . Diabetes type 2, uncontrolled   . Seizures   . Arthritis     Past Surgical History  Procedure Laterality Date  . Cholecystectomy    . Tubal ligation      Molli Barrows, RD, LDN, [place] Pager [phone] After Hours Pager [phone]

## 2012-09-22 NOTE — Progress Notes (Signed)
Inpatient Diabetes Program Recommendations  AACE/ADA: New Consensus Statement on Inpatient Glycemic Control (2013)  Target Ranges:  Prepandial:   less than 140 mg/dL      Peak postprandial:   less than 180 mg/dL (1-2 hours)      Critically ill patients:  140 - 180 mg/dL   Reason for Visit: Results for Andrea Wade, Andrea Wade (MRN [id number]) as of 09/22/2012 14:12  Ref. Range 09/22/2012 06:05 09/22/2012 11:21  Glucose-Capillary Latest Range: 70-99 mg/dL 173 (H) 161 (H)   Note history of diabetes.  It appears that patient was taking Glucotrol 5 mg bid prior to admission for diabetes (which is generic and should be 4$).  Consider checking A1C to determine pre-hospitalization glycemic control.

## 2012-09-22 NOTE — ED Provider Notes (Signed)
Medical screening examination/treatment/procedure(s) were conducted as a shared visit with non-physician practitioner(s) and myself.  I personally evaluated the patient during the encounter 43 yo woman with chest pain and shortness of breath.  She has diabetes and seizure disorder, not on any medicine for this.  She is in the process of workup for multiple sclerosis in [place]; an MRI showed changes suggestive of multiple sclerosis.  Exam shows her in moderate distress complaining of chest pain.  Heart sounds normal, lungs clear, no calf tenderness, has a resting tremor in left arm.  EKG, chest x-ray, CTA of chest negative.  Recommend obs admission for chest pain observation.  Mylinda Latina III, MD 09/22/12 [phone]

## 2012-09-23 DIAGNOSIS — I517 Cardiomegaly: Secondary | ICD-10-CM

## 2012-09-23 LAB — CK: Total CK: 44 U/L (ref 7–177)

## 2012-09-23 LAB — CSF CELL COUNT WITH DIFFERENTIAL
RBC Count, CSF: 10 /mm3 — ABNORMAL HIGH
Tube #: 1
WBC, CSF: 1 /mm3 (ref 0–5)

## 2012-09-23 LAB — GRAM STAIN

## 2012-09-23 LAB — GLUCOSE, CAPILLARY
Glucose-Capillary: 141 mg/dL — ABNORMAL HIGH (ref 70–99)
Glucose-Capillary: 135 mg/dL — ABNORMAL HIGH (ref 70–99)

## 2012-09-23 LAB — PROTEIN AND GLUCOSE, CSF: Glucose, CSF: 99 mg/dL — ABNORMAL HIGH (ref 43–76)

## 2012-09-23 LAB — HEMOGLOBIN A1C: Hgb A1c MFr Bld: 8.8 % — ABNORMAL HIGH (ref <5.7)

## 2012-09-23 MED ORDER — PROMETHAZINE HCL 25 MG PO TABS
12.5000 mg | ORAL_TABLET | Freq: Four times a day (QID) | ORAL | Status: DC | PRN
  Administered 2012-09-25 – 2012-09-26 (×3): 12.5 mg via ORAL
  Filled 2012-09-23 (×3): qty 1

## 2012-09-23 MED ORDER — HYDROMORPHONE HCL PF 1 MG/ML IJ SOLN
0.5000 mg | INTRAMUSCULAR | Status: DC | PRN
  Administered 2012-09-23: 0.5 mg via INTRAVENOUS
  Filled 2012-09-23: qty 1

## 2012-09-23 MED ORDER — ALPRAZOLAM 0.25 MG PO TABS
0.2500 mg | ORAL_TABLET | Freq: Every day | ORAL | Status: DC | PRN
  Administered 2012-09-23 – 2012-09-25 (×3): 0.25 mg via ORAL
  Filled 2012-09-23 (×3): qty 1

## 2012-09-23 NOTE — Progress Notes (Signed)
Rana from Xray called unit 2000 to inform the nurse that the doctor who placed the order for the Lumbar Puncture must attempt first before they would perform the procedure. Also, she informed the nurse that a MRI or the head and CT of the head must be done prior to the LP attempt. Fara Boros

## 2012-09-23 NOTE — Progress Notes (Signed)
09/23/12 Nursing note  LP kit, 7.5 size steril gloves, 4x4s, and betatdine at bedside as directed by Dr Nicole Kindred. Shayn Madole, Bettina Gavia rN

## 2012-09-23 NOTE — Care Management Note (Signed)
    Page 1 of 2   09/27/2012     4:30:08 PM   CARE MANAGEMENT NOTE 09/27/2012  Patient:  Andrea Wade, Andrea Wade   Account Number:  [id number]  Date Initiated:  09/23/2012  Documentation initiated by:  Montrell Cessna  Subjective/Objective Assessment:   PT ADM ON 09/23/12 WITH CHEST PAIN, MULTIPLE SCLEROSIS. PTA, PT INDEPENDENT, LIVES WITH FIANCE.     Action/Plan:   CM REFERRAL FOR MEDICATION ASSISTANCE.  PT ELIGIBLE FOR MATCH PROGRAM.   P.T. EVALUATION PENDING FOR RECOMMENDATIONS FOR HOME NEEDS.   Anticipated DC Date:  09/24/2012   Anticipated DC Plan:  [place]  CM consult  Medication Assistance  [place] Program      [facility] Choice  HOME HEALTH   Choice offered to / List presented to:  C-1 Patient   DME arranged  [place]      DME agency  [place] arranged  HH-1 RN  HH-6 SOCIAL WORKER      Status of service:  Completed, signed off Medicare Important Message given?   (If response is "NO", the following Medicare IM given date fields will be blank) Date Medicare IM given:   Date Additional Medicare IM given:    Discharge Disposition:  [place]  Per UR Regulation:  Reviewed for med. necessity/level of care/duration of stay  If discussed at [place] of Stay Meetings, dates discussed:    Comments:  09/26/12 Antoine Primas [phone] PT [place].  PT GIVEN MATCH LETTER WITH EXPLANATION OF PROGRAM BENEFITS.  REFERRAL TO [facility] FOR HH FOLLOW UP.  START OF CARE 24-48H POST DC DATE.  REFERRAL TO [facility] FOR DME NEEDS.  PT TO DC HOME WITH FIANCE AS CAREGIVER. CHECKED WITH [facility]:  THEY DO REQUIRE $200 UP FRONT PAYMENT FOR APPTS, BUT THEY WILL SET UP PAYMENT PLANS.  PT INTERESTED IN APPT.  LEFT PT INFO WITH DEBBIE FROM [facility].  SHE WILL CALL TO SET UP APPT.  09/23/12 Damondre Pfeifle,RN,BSN [phone] MET WITH PT TO DISCUSS ISSUES WITH OBTAINING  MEDICATIONS. PT HAS NO INSURANCE. SHE APPLIED FOR MEDICAID ABOUT A MONTH AGO, AND IS AWAITING A MEDICAID HEARING.  HER FIANCE WORKS, AND HELPS HER WITH SOME MEDS.  SHE HAS TROUBLE AFFORDING INSULIN AND KEPPRA.  WILL PROVIDE PT WITH MATCH LETTER, AND RESEARCH POSSIBLE PT ASSISTANCE PROGRAMS FOR HOME MEDICATIONS.  SHE HAS A PRIMARY HEALTHCARE PROVIDER, Heide Scales, NP.

## 2012-09-23 NOTE — Procedures (Signed)
Lumbar Puncture  Clinical note: 42 year old lady is being evaluated to rule out multiple sclerosis; MRI study abnormal and indicative of demyelinating disorder.  Routine lumbar puncture was performed via the L3-4 interspace following sterile preparation and instillation of 1% lidocaine for local anesthesia. Tap was atraumatic. Fluid was clear in colorless. Opening pressure was 150 mm of CSF. A total of 10 cc of fluid were collected and submitted for routine laboratory studies as well as oligoclonal bands. Patient tolerated procedure well. There were no complications.  Rush Farmer M.D. [facility] [phone]

## 2012-09-23 NOTE — Progress Notes (Signed)
Pt's pain unrelieved by prn dose of toradol given at 18:30. Called Md and received orders for prn IV dilaudid. Dose administered per orders.

## 2012-09-23 NOTE — Progress Notes (Signed)
[facility] PROGRESS NOTE  Andrea Wade ENI:[id number] DOB: 10/25/1970 DOA: 09/21/2012 PCP: Imagene Riches, NP  Assessment/Plan: 1. Atypical chest pain: tenderness on palpation over the sternum, shoulders and legs.  - cardiac enzymes negative,  - ekg does not show any st t wave changes.   2. Multiple sclerosis: as per the patient, she was recently diagnosed with MS based on MRI brain. LP in am for diagnostic purposes.   3. DM: on SSI.   4. dvt prophylaxis.   Code Status: full code Family Communication: none at bedside.  Disposition Plan: pending    Consultants:  none  Procedures:  none  Antibiotics:  none  HPI/Subjective: Reports generalized pain all over.   Objective: Filed Vitals:   09/23/12 1723 09/23/12 1726 09/23/12 1730 09/23/12 1733  BP: 110/71 120/64 119/68 113/60  Pulse: 99 97 90 90  Temp:      TempSrc:      Resp:      Height:      Weight:      SpO2:       No intake or output data in the 24 hours ending 09/23/12 1743 Filed Weights   09/22/12 0349  Weight: 97 kg (213 lb 13.5 oz)    Exam:   General:  Alert afebrile comfortable  Cardiovascular: s1s2   Respiratory: ctab  Abdomen: soft NT ND BS+  Musculoskeletal: no pedal edema.   Data Reviewed: Basic Metabolic Panel:  Recent Labs Lab 09/21/12 2041  NA 132*  K 3.5  CL 97  CO2 22  GLUCOSE 322*  BUN 5*  CREATININE 0.39*  CALCIUM 9.1   Liver Function Tests:  Recent Labs Lab 09/21/12 2041  AST 21  ALT 10  ALKPHOS 89  BILITOT 0.3  PROT 8.0  ALBUMIN 2.9*   No results found for this basename: LIPASE, AMYLASE,  in the last 168 hours No results found for this basename: AMMONIA,  in the last 168 hours CBC:  Recent Labs Lab 09/21/12 2041  WBC 8.3  HGB 11.4*  HCT 35.1*  MCV 71.5*  PLT 399   Cardiac Enzymes:  Recent Labs Lab 09/22/12 0220 09/22/12 1005 09/22/12 1519 09/23/12 1049  CKTOTAL  --   --   --  44  TROPONINI <0.30 <0.30 <0.30  --    BNP (last 3  results)  Recent Labs  09/21/12 2042  PROBNP 11.2   CBG:  Recent Labs Lab 09/22/12 1632 09/22/12 2119 09/23/12 0602 09/23/12 1054 09/23/12 1614  GLUCAP 202* 190* 196* 197* 141*    No results found for this or any previous visit (from the past 240 hour(s)).   Studies: Dg Chest 2 View  09/21/2012   *RADIOLOGY REPORT*  Clinical Data: Left-sided chest pain and shortness of breath.  CHEST - 2 VIEW  Comparison: Two-view chest 08/24/2012.  Findings: The heart size is normal.  The lungs are clear.  The visualized soft tissues and bony thorax are unremarkable.  IMPRESSION: Negative two-view chest.   Original Report Authenticated By: San Morelle, M.D.   Ct Angio Chest Pe W/cm &/or Wo Cm  09/22/2012   *RADIOLOGY REPORT*  Clinical Data: Chest pain.  Shortness of breath.  Elevated D-dimer. Numbness and tingling in the arms.  CT ANGIOGRAPHY CHEST  Technique:  Multidetector CT imaging of the chest using the standard protocol during bolus administration of intravenous contrast. Multiplanar reconstructed images including MIPs were obtained and reviewed to evaluate the vascular anatomy.  Contrast: 168m OMNIPAQUE IOHEXOL 350 MG/ML SOLN  Comparison: 07/11/2003  Findings: Technically adequate study with good opacification of the central and segmental pulmonary arteries.  No focal filling defects are demonstrated.  No evidence of significant pulmonary embolus.  Normal heart size.  Normal caliber thoracic aorta.  No aortic dissection.  No significant lymphadenopathy in the chest. Esophagus is decompressed.  Thyroid gland appears homogeneous.  No pleural effusion.  Visualized portions of the upper abdominal organs are grossly unremarkable.  No focal airspace disease or consolidation in the lungs.  Minimal dependent atelectasis.  No significant interstitial changes.  No pneumothorax.  Airways appear patent.  Mild degenerative changes in the thoracic spine.  No destructive bone lesions are appreciated.  Vertebral hemangioma at T8.  IMPRESSION: No evidence of significant pulmonary embolus.  No evidence of active pulmonary disease.   Original Report Authenticated By: Lucienne Capers, M.D.    Scheduled Meds: . docusate sodium  100 mg Oral BID  . feeding supplement  237 mL Oral BID BM  . glipiZIDE  5 mg Oral BID AC  . insulin aspart  0-15 Units Subcutaneous TID WC  . insulin aspart  0-5 Units Subcutaneous QHS  . levETIRAcetam  500 mg Oral Daily  . sodium chloride  3 mL Intravenous Q12H   Continuous Infusions:   Principal Problem:   Atypical chest pain Active Problems:   Multiple sclerosis   Noncompliance   Tobacco abuse   DM2 (diabetes mellitus, type 2)   Protein-calorie malnutrition, severe    Time spent: 20 min    Charmine Bockrath  [facility] Pager [phone]. If 7PM-7AM, please contact night-coverage at [email or web address], password [facility] 09/23/2012, 5:43 PM  LOS: 2 days

## 2012-09-23 NOTE — Progress Notes (Signed)
Pt given 12.39m IV Phenergan at this time for nausea; will cont. To monitor.

## 2012-09-23 NOTE — Progress Notes (Signed)
Dr. Karleen Hampshire paged at this time; per radiology LP must be attempted at bedside first prior to pt going to radiology; if it cannot be done at bedside pt with need either CT or MRI brain prior to radiology doing LP; will await callback.

## 2012-09-23 NOTE — Progress Notes (Signed)
Consent obtained for LP to be done at bedside by Dr. Nicole Kindred.

## 2012-09-23 NOTE — Progress Notes (Signed)
Dr. Nicole Kindred at bedside for LP

## 2012-09-23 NOTE — Progress Notes (Signed)
  Echocardiogram 2D Echocardiogram has been performed.  Andrea Wade 09/23/2012, 8:13 AM

## 2012-09-23 NOTE — Progress Notes (Signed)
Pt reports relief of nausea; pt reports having a small panic attack at this time; VSS; will cont. To monitor.

## 2012-09-23 NOTE — Progress Notes (Signed)
Pt given PO Xanax per pt request at this time; will cont. To monitor.

## 2012-09-24 LAB — GLUCOSE, CAPILLARY
Glucose-Capillary: 133 mg/dL — ABNORMAL HIGH (ref 70–99)
Glucose-Capillary: 141 mg/dL — ABNORMAL HIGH (ref 70–99)
Glucose-Capillary: 169 mg/dL — ABNORMAL HIGH (ref 70–99)

## 2012-09-24 MED ORDER — KETOROLAC TROMETHAMINE 15 MG/ML IJ SOLN
15.0000 mg | Freq: Four times a day (QID) | INTRAMUSCULAR | Status: DC | PRN
  Administered 2012-09-24 – 2012-09-26 (×5): 15 mg via INTRAVENOUS
  Filled 2012-09-24 (×5): qty 1

## 2012-09-24 NOTE — Progress Notes (Signed)
[facility] PROGRESS NOTE  Andrea Wade ZOX:[id number] DOB: 05-Feb-1971 DOA: 09/21/2012 PCP: Imagene Riches, NP  Assessment/Plan: 1. Atypical chest pain: tenderness on palpation over the sternum, shoulders and legs.  - cardiac enzymes negative,  - ekg does not show any st t wave changes.  - echocardiogram  Shows LVEF of 555 with grade 1 diastolic dysfunction. No valvular abnormalities.   2. Multiple sclerosis: as per the patient, she was recently diagnosed with MS based on MRI brain. LP was done yesterday by Dr Nicole Kindred and labs sent for analysis. Continue with keppra for seizures.   3. DM: on SSI. Her hgba1c is 8.8, she is currently on glipizide and SSI.    4. dvt prophylaxis.   Code Status: full code Family Communication: none at bedside.  Disposition Plan: Possibly home in am.    Consultants:  neurology  Procedures:  LP on 7/25  Antibiotics:  none  HPI/Subjective: Reports generalized pain all over but better than yesterday.  Objective: Filed Vitals:   09/23/12 1733 09/23/12 2154 09/24/12 0437 09/24/12 1345  BP: 113/60 123/66 126/72 124/66  Pulse: 90 104 126 94  Temp:  98.6 F (37 C) 99.8 F (37.7 C) 97.9 F (36.6 C)  TempSrc:  Oral Oral Oral  Resp:  18 18 16   Height:      Weight:      SpO2:  99% 95% 97%    Intake/Output Summary (Last 24 hours) at 09/24/12 1638 Last data filed at 09/24/12 1300  Gross per 24 hour  Intake      0 ml  Output    300 ml  Net   -300 ml   Filed Weights   09/22/12 0349  Weight: 97 kg (213 lb 13.5 oz)    Exam:   General:  Alert afebrile comfortable  Cardiovascular: s1s2   Respiratory: ctab  Abdomen: soft NT ND BS+  Musculoskeletal: no pedal edema.   Data Reviewed: Basic Metabolic Panel:  Recent Labs Lab 09/21/12 2041  NA 132*  K 3.5  CL 97  CO2 22  GLUCOSE 322*  BUN 5*  CREATININE 0.39*  CALCIUM 9.1   Liver Function Tests:  Recent Labs Lab 09/21/12 2041  AST 21  ALT 10  ALKPHOS 89   BILITOT 0.3  PROT 8.0  ALBUMIN 2.9*   No results found for this basename: LIPASE, AMYLASE,  in the last 168 hours No results found for this basename: AMMONIA,  in the last 168 hours CBC:  Recent Labs Lab 09/21/12 2041  WBC 8.3  HGB 11.4*  HCT 35.1*  MCV 71.5*  PLT 399   Cardiac Enzymes:  Recent Labs Lab 09/22/12 0220 09/22/12 1005 09/22/12 1519 09/23/12 1049  CKTOTAL  --   --   --  21  TROPONINI <0.30 <0.30 <0.30  --    BNP (last 3 results)  Recent Labs  09/21/12 2042  PROBNP 11.2   CBG:  Recent Labs Lab 09/23/12 1614 09/23/12 2150 09/24/12 0616 09/24/12 0758 09/24/12 1114  GLUCAP 141* 135* 133* 141* 192*    Recent Results (from the past 240 hour(s))  GRAM STAIN     Status: None   Collection Time    09/23/12  5:59 PM      Result Value Range Status   Specimen Description CSF   Final   Special Requests NONE   Final   Gram Stain     Final   Value: WBC PRESENT, PREDOMINANTLY MONONUCLEAR     NO ORGANISMS SEEN  CYTO SPIN SLIDE   Report Status 09/23/2012 FINAL   Final  CSF CULTURE     Status: None   Collection Time    09/23/12  5:59 PM      Result Value Range Status   Specimen Description CSF   Final   Special Requests NONE   Final   Gram Stain     Final   Value: WBC PRESENT, PREDOMINANTLY MONONUCLEAR     NO ORGANISMS SEEN     CYTOSPIN Performed at [facility]   Culture PENDING   Incomplete   Report Status PENDING   Incomplete     Studies: No results found.  Scheduled Meds: . docusate sodium  100 mg Oral BID  . feeding supplement  237 mL Oral BID BM  . glipiZIDE  5 mg Oral BID AC  . insulin aspart  0-15 Units Subcutaneous TID WC  . insulin aspart  0-5 Units Subcutaneous QHS  . levETIRAcetam  500 mg Oral Daily  . sodium chloride  3 mL Intravenous Q12H   Continuous Infusions:   Principal Problem:   Atypical chest pain Active Problems:   Multiple sclerosis   Noncompliance   Tobacco abuse   DM2 (diabetes mellitus, type  2)   Protein-calorie malnutrition, severe    Time spent: 20 min    Andrea Wade  [facility] Pager [phone]. If 7PM-7AM, please contact night-coverage at [email or web address], password [facility] 09/24/2012, 4:38 PM  LOS: 3 days

## 2012-09-25 DIAGNOSIS — E43 Unspecified severe protein-calorie malnutrition: Secondary | ICD-10-CM

## 2012-09-25 LAB — CBC
HCT: 32.3 % — ABNORMAL LOW (ref 36.0–46.0)
Hemoglobin: 10.6 g/dL — ABNORMAL LOW (ref 12.0–15.0)
MCH: 23.8 pg — ABNORMAL LOW (ref 26.0–34.0)
MCHC: 32.8 g/dL (ref 30.0–36.0)
MCV: 72.6 fL — ABNORMAL LOW (ref 78.0–100.0)
Platelets: 380 10*3/uL (ref 150–400)
RDW: 16.7 % — ABNORMAL HIGH (ref 11.5–15.5)

## 2012-09-25 LAB — GLUCOSE, CAPILLARY
Glucose-Capillary: 183 mg/dL — ABNORMAL HIGH (ref 70–99)
Glucose-Capillary: 120 mg/dL — ABNORMAL HIGH (ref 70–99)

## 2012-09-25 LAB — BASIC METABOLIC PANEL
CO2: 26 mEq/L (ref 19–32)
Calcium: 9 mg/dL (ref 8.4–10.5)
Chloride: 100 mEq/L (ref 96–112)
Sodium: 135 mEq/L (ref 135–145)

## 2012-09-25 NOTE — Progress Notes (Addendum)
[facility] PROGRESS NOTE  Andrea Wade EPP:[id number] DOB: 09/11/1970 DOA: 09/21/2012 PCP: Imagene Riches, NP  Assessment/Plan: 1. Atypical chest pain: tenderness on palpation over the sternum, shoulders and legs.  - cardiac enzymes negative,  - ekg ON 7/25 show t wave inversions on lead d III, avf and v2 to v4.  Repeat EKG today.  - echocardiogram  Shows LVEF of 55% with grade 1 diastolic dysfunction. No valvular abnormalities.   2. Multiple sclerosis: as per the patient, she was recently diagnosed with MS based on MRI brain. LP was done yesterday by Dr Nicole Kindred and labs sent for analysis. Continue with keppra for seizures. LP for oligoclonal bands still pending.   3. DM: on SSI. Her hgba1c is 8.8, she is currently on glipizide and SSI.    4. dvt prophylaxis.   Code Status: full code Family Communication: none at bedside.  Disposition Plan: Possibly home in am . PT EVAL .    Consultants:  neurology  Procedures:  LP on 7/25  Antibiotics:  none  HPI/Subjective: Still reports chest wall pain.   Objective: Filed Vitals:   09/24/12 1345 09/24/12 2014 09/25/12 0418 09/25/12 1500  BP: 124/66 121/77 127/75 119/77  Pulse: 94 87 82 83  Temp: 97.9 F (36.6 C) 98.7 F (37.1 C) 99.1 F (37.3 C) 98.5 F (36.9 C)  TempSrc: Oral Oral Oral Oral  Resp: 16 18 18 16   Height:      Weight:      SpO2: 97% 96% 94% 97%    Intake/Output Summary (Last 24 hours) at 09/25/12 1616 Last data filed at 09/25/12 1100  Gross per 24 hour  Intake    360 ml  Output      0 ml  Net    360 ml   Filed Weights   09/22/12 0349  Weight: 97 kg (213 lb 13.5 oz)    Exam:   General:  Alert afebrile comfortable  Cardiovascular: s1s2   Respiratory: ctab  Abdomen: soft NT ND BS+  Musculoskeletal: no pedal edema.   Data Reviewed: Basic Metabolic Panel:  Recent Labs Lab 09/21/12 2041 09/25/12 0512  NA 132* 135  K 3.5 3.5  CL 97 100  CO2 22 26  GLUCOSE 322* 143*  BUN 5* 9   CREATININE 0.39* 0.48*  CALCIUM 9.1 9.0   Liver Function Tests:  Recent Labs Lab 09/21/12 2041  AST 21  ALT 10  ALKPHOS 89  BILITOT 0.3  PROT 8.0  ALBUMIN 2.9*   No results found for this basename: LIPASE, AMYLASE,  in the last 168 hours No results found for this basename: AMMONIA,  in the last 168 hours CBC:  Recent Labs Lab 09/21/12 2041 09/25/12 0512  WBC 8.3 7.6  HGB 11.4* 10.6*  HCT 35.1* 32.3*  MCV 71.5* 72.6*  PLT 399 380   Cardiac Enzymes:  Recent Labs Lab 09/22/12 0220 09/22/12 1005 09/22/12 1519 09/23/12 1049  CKTOTAL  --   --   --  82  TROPONINI <0.30 <0.30 <0.30  --    BNP (last 3 results)  Recent Labs  09/21/12 2042  PROBNP 11.2   CBG:  Recent Labs Lab 09/24/12 1114 09/24/12 1626 09/24/12 2119 09/25/12 0620 09/25/12 1138  GLUCAP 192* 169* 109* 132* 183*    Recent Results (from the past 240 hour(s))  GRAM STAIN     Status: None   Collection Time    09/23/12  5:59 PM      Result Value Range Status  Specimen Description CSF   Final   Special Requests NONE   Final   Gram Stain     Final   Value: WBC PRESENT, PREDOMINANTLY MONONUCLEAR     NO ORGANISMS SEEN     CYTO SPIN SLIDE   Report Status 09/23/2012 FINAL   Final  CSF CULTURE     Status: None   Collection Time    09/23/12  5:59 PM      Result Value Range Status   Specimen Description CSF   Final   Special Requests NONE   Final   Gram Stain     Final   Value: WBC PRESENT, PREDOMINANTLY MONONUCLEAR     NO ORGANISMS SEEN     CYTOSPIN Performed at [facility]   Culture NO GROWTH 1 DAY   Final   Report Status PENDING   Incomplete     Studies: No results found.  Scheduled Meds: . docusate sodium  100 mg Oral BID  . feeding supplement  237 mL Oral BID BM  . glipiZIDE  5 mg Oral BID AC  . insulin aspart  0-15 Units Subcutaneous TID WC  . insulin aspart  0-5 Units Subcutaneous QHS  . levETIRAcetam  500 mg Oral Daily  . sodium chloride  3 mL Intravenous Q12H    Continuous Infusions:   Principal Problem:   Atypical chest pain Active Problems:   Multiple sclerosis   Noncompliance   Tobacco abuse   DM2 (diabetes mellitus, type 2)   Protein-calorie malnutrition, severe    Time spent: 20 min    Franklin Baumbach  [facility] Pager [phone]. If 7PM-7AM, please contact night-coverage at [email or web address], password [facility] 09/25/2012, 4:16 PM  LOS: 4 days

## 2012-09-26 DIAGNOSIS — R079 Chest pain, unspecified: Secondary | ICD-10-CM

## 2012-09-26 MED ORDER — ALPRAZOLAM 0.25 MG PO TABS: 0.2500 mg | ORAL_TABLET | Freq: Every day | ORAL | Status: DC | PRN

## 2012-09-26 MED ORDER — OXYCODONE-ACETAMINOPHEN 5-325 MG PO TABS
2.0000 | ORAL_TABLET | ORAL | Status: DC | PRN
  Administered 2012-09-26: 2 via ORAL
  Filled 2012-09-26: qty 2

## 2012-09-26 MED ORDER — GLIPIZIDE 5 MG PO TABS: 5.0000 mg | ORAL_TABLET | Freq: Two times a day (BID) | ORAL | Status: DC

## 2012-09-26 MED ORDER — OXYCODONE-ACETAMINOPHEN 5-325 MG PO TABS: 2.0000 | ORAL_TABLET | ORAL | Status: DC | PRN

## 2012-09-26 MED ORDER — METFORMIN HCL 500 MG PO TABS: 500.0000 mg | ORAL_TABLET | Freq: Two times a day (BID) | ORAL | Status: DC

## 2012-09-26 MED ORDER — DSS 100 MG PO CAPS: 100.0000 mg | ORAL_CAPSULE | Freq: Two times a day (BID) | ORAL | Status: DC

## 2012-09-26 MED ORDER — GLUCERNA SHAKE PO LIQD: 237.0000 mL | Freq: Two times a day (BID) | ORAL | Status: DC

## 2012-09-26 MED ORDER — LEVETIRACETAM 500 MG PO TABS: 500.0000 mg | ORAL_TABLET | Freq: Every day | ORAL | Status: DC

## 2012-09-26 MED ORDER — ONETOUCH ULTRASOFT LANCETS MISC: Status: DC

## 2012-09-26 MED ORDER — PROMETHAZINE HCL 12.5 MG PO TABS: 12.5000 mg | ORAL_TABLET | Freq: Four times a day (QID) | ORAL | Status: DC | PRN

## 2012-09-26 MED ORDER — ASPIRIN EC 81 MG PO TBEC: 81.0000 mg | DELAYED_RELEASE_TABLET | Freq: Every day | ORAL | Status: DC

## 2012-09-26 NOTE — Discharge Summary (Signed)
Physician Discharge Summary  Andrea Wade [id number]:[id number] DOB: [birth year] DOA: 09/21/2012  PCP: Imagene Riches, NP  Admit date: 09/21/2012 Discharge date: 09/26/2012  Time spent: 40 minutes  Recommendations for Outpatient Follow-up:  1. Follow up with PCP in one week 2. Follow up with [facility] in 1 to 2 weeks. She is newly diagnosed with MS. Awaiting LP results.  3. Follow up with cardiology as recommended.   Discharge Diagnoses:  Principal Problem:   Atypical chest pain Active Problems:   Multiple sclerosis   Noncompliance   Tobacco abuse   DM2 (diabetes mellitus, type 2)   Protein-calorie malnutrition, severe   Discharge Condition: improved  Diet recommendation: carb modified diet  Filed Weights   09/22/12 0349 09/26/12 0526  Weight: 97 kg (213 lb 13.5 oz) 97.1 kg (214 lb 1.1 oz)    History of present illness:   Andrea Wade is an 42 y.o. female with hx of DM, poor compliance with meds due to lack of insurance, hx of seizure not on ACD due to cost contraint, recently Dx with MS though she still need LP to confirm, not on any meds for her presumped MS, presents to the ER with an episode of substernal CP. She has no SOB, fever or chills, nausea or vomiting. Evaluation in the ER included an EKG which shows no ischemic nor injurious ST segments, clear CXR, and negative troponin. She is having slight chest discomfort which was palpable in the ER. She also had an elevated D-dimer, with subsequent negative CTPA. Hospitalist was asked to admit her for atypical chest pain. Her cardiac enzymes have been negative. Her echo showed grade 1 diastolic dysfunction. EKG shows  She has a family h/o heart disease and she has risk factors of diabetes mellitus. Offered inpatient cardiology work up with stress test. She reports its too much and declined at this time and wanted outpatient work up. Spoke to [employer] and will make new patient appointment for her for possible outpatient  stress. She underwent an LP on 7/25 and the results will be back on 7/31 or 8/1 as per the conversation with the lab. Recommend following up with [facility] as outpatient in 1 to 2 weeks.   Hospital Course:   1. Atypical chest pain: tenderness on palpation over the sternum, shoulders and legs. Improved with pain medications.  - cardiac enzymes negative,  - ekg ON 7/25 show t wave inversions on lead d III, avf and v2 to v4. Repeat EKG showed slight improvement but persistent changes.  - echocardiogram Shows LVEF of 55% with grade 1 diastolic dysfunction. No valvular abnormalities.  Recommended and offered inpatient cardiology consultation, she deferred for outpatient work up. Spoke to [employer] with [facility], they will set up outpatient appointment.   2. Multiple sclerosis: as per the patient, she was recently diagnosed with MS based on MRI brain. LP was done on 7/25 by Dr Nicole Kindred and labs sent for analysis. Continue with keppra for seizures. LP for oligoclonal bands still pending. And will be back by thrusday 7/31 or Friday 8/1. Recommend outpatient follow up with neurology.   3. DM: on SSI. Her hgba1c is 8.8, she is currently on glipizide and SSI.   4. Chronic pain and spinal stenosis; pain control.   Procedures:  LP on 7/25  Consultations:  Neurology  Offered inpatient cardiology work up like stress test for abnormal EKG, and echo. She deferred it for outpatient work up. She currently doesn't have insurance and waiting for medicaid and would  like further work up after she gets the [employer].   Discharge Exam: Filed Vitals:   09/25/12 0418 09/25/12 1500 09/25/12 1956 09/26/12 0526  BP: 127/75 119/77 112/74 113/69  Pulse: 82 83 82 66  Temp: 99.1 F (37.3 C) 98.5 F (36.9 C) 98.6 F (37 C) 98.2 F (36.8 C)  TempSrc: Oral Oral Oral Oral  Resp: 18 16 18 18   Height:      Weight:    97.1 kg (214 lb 1.1 oz)  SpO2: 94% 97% 99% 95%    General: Alert afebrile comfortable   Cardiovascular: s1s2 RRR, no MRG.  Respiratory: ctab chest wall tenderness present.  Abdomen: soft NT ND BS+  Musculoskeletal: no pedal edema.    Discharge Instructions      Discharge Orders   Future Orders Complete By Expires     Discharge instructions  As directed     Comments:      Follow up with PCP, [facility] and [facility] in 1 to 2 weeks.        Medication List    STOP taking these medications       acetaminophen 500 MG tablet  Commonly known as:  TYLENOL     GOODY HEADACHE PO      TAKE these medications       ALPRAZolam 0.25 MG tablet  Commonly known as:  XANAX  Take 1 tablet (0.25 mg total) by mouth daily as needed for anxiety.     aspirin EC 81 MG tablet  Take 1 tablet (81 mg total) by mouth daily.     DSS 100 MG Caps  Take 100 mg by mouth 2 (two) times daily.     feeding supplement Liqd  Take 237 mLs by mouth 2 (two) times daily between meals.     glipiZIDE 5 MG tablet  Commonly known as:  GLUCOTROL  Take 1 tablet (5 mg total) by mouth 2 (two) times daily before a meal.     levETIRAcetam 500 MG tablet  Commonly known as:  KEPPRA  Take 1 tablet (500 mg total) by mouth daily.     metFORMIN 500 MG tablet  Commonly known as:  GLUCOPHAGE  Take 1 tablet (500 mg total) by mouth 2 (two) times daily with a meal.     oxyCODONE-acetaminophen 5-325 MG per tablet  Commonly known as:  PERCOCET/ROXICET  Take 2 tablets by mouth every 4 (four) hours as needed.     promethazine 12.5 MG tablet  Commonly known as:  PHENERGAN  Take 1 tablet (12.5 mg total) by mouth every 6 (six) hours as needed.       Allergies  Allergen Reactions  . Codeine Hives, Itching and Palpitations  . Peanut-Containing Drug Products Anaphylaxis  . Penicillins Hives, Itching and Nausea And Vomiting   Follow-up Information   Follow up with Imagene Riches, NP. Schedule an appointment as soon as possible for a visit in 1 week.   Contact information:    [place] [address] [phone]       Follow up with [facility]. Schedule an appointment as soon as possible for a visit in 2 weeks.   Contact information:   [address] [place] [address] [phone]       The results of significant diagnostics from this hospitalization (including imaging, microbiology, ancillary and laboratory) are listed below for reference.    Significant Diagnostic Studies: Dg Chest 2 View  09/21/2012   *RADIOLOGY  REPORT*  Clinical Data: Left-sided chest pain and shortness of breath.  CHEST - 2 VIEW  Comparison: Two-view chest 08/24/2012.  Findings: The heart size is normal.  The lungs are clear.  The visualized soft tissues and bony thorax are unremarkable.  IMPRESSION: Negative two-view chest.   Original Report Authenticated By: [place] Morelle, M.D.   Ct Angio Chest Pe W/cm &/or Wo Cm  09/22/2012   *RADIOLOGY REPORT*  Clinical Data: Chest pain.  Shortness of breath.  Elevated D-dimer. Numbness and tingling in the arms.  CT ANGIOGRAPHY CHEST  Technique:  Multidetector CT imaging of the chest using the standard protocol during bolus administration of intravenous contrast. Multiplanar reconstructed images including MIPs were obtained and reviewed to evaluate the vascular anatomy.  Contrast: 122m OMNIPAQUE IOHEXOL 350 MG/ML SOLN  Comparison: 07/11/2003  Findings: Technically adequate study with good opacification of the central and segmental pulmonary arteries.  No focal filling defects are demonstrated.  No evidence of significant pulmonary embolus.  Normal heart size.  Normal caliber thoracic aorta.  No aortic dissection.  No significant lymphadenopathy in the chest. Esophagus is decompressed.  Thyroid gland appears homogeneous.  No pleural effusion.  Visualized portions of the upper abdominal organs are grossly unremarkable.  No focal airspace disease or consolidation in the lungs.  Minimal dependent  atelectasis.  No significant interstitial changes.  No pneumothorax.  Airways appear patent.  Mild degenerative changes in the thoracic spine.  No destructive bone lesions are appreciated. Vertebral hemangioma at T8.  IMPRESSION: No evidence of significant pulmonary embolus.  No evidence of active pulmonary disease.   Original Report Authenticated By: WLucienne Capers M.D.    Microbiology: Recent Results (from the past 240 hour(s))  GRAM STAIN     Status: None   Collection Time    09/23/12  5:59 PM      Result Value Range Status   Specimen Description CSF   Final   Special Requests NONE   Final   Gram Stain     Final   Value: WBC PRESENT, PREDOMINANTLY MONONUCLEAR     NO ORGANISMS SEEN     CYTO SPIN SLIDE   Report Status 09/23/2012 FINAL   Final  CSF CULTURE     Status: None   Collection Time    09/23/12  5:59 PM      Result Value Range Status   Specimen Description CSF   Final   Special Requests NONE   Final   Gram Stain     Final   Value: WBC PRESENT, PREDOMINANTLY MONONUCLEAR     NO ORGANISMS SEEN     [facility]  Culture NO GROWTH 2 DAYS   Final   Report Status PENDING   Incomplete     Labs: Basic Metabolic Panel:  Recent Labs Lab 09/21/12 2041 09/25/12 0512  NA 132* 135  K 3.5 3.5  CL 97 100  CO2 22 26  GLUCOSE 322* 143*  BUN 5* 9  CREATININE 0.39* 0.48*  CALCIUM 9.1 9.0   Liver Function Tests:  Recent Labs Lab 09/21/12 2041  AST 21  ALT 10  ALKPHOS 89  BILITOT 0.3  PROT 8.0  ALBUMIN 2.9*   No results found for this basename: LIPASE, AMYLASE,  in the last 168 hours No results found for this basename: AMMONIA,  in the last 168 hours CBC:  Recent Labs Lab 09/21/12 2041 09/25/12 0512  WBC 8.3 7.6  HGB 11.4* 10.6*  HCT 35.1* 32.3*  MCV 71.5* 72.6*  PLT 399 380   Cardiac Enzymes:  Recent Labs Lab 09/22/12 0220 09/22/12 1005 09/22/12 1519 09/23/12 1049  CKTOTAL  --   --   --  72  TROPONINI <0.30 <0.30 <0.30   --    BNP: BNP (last 3 results)  Recent Labs  09/21/12 2042  PROBNP 11.2   CBG:  Recent Labs Lab 09/25/12 1138 09/25/12 1634 09/25/12 2125 09/26/12 0616 09/26/12 1139  GLUCAP 183* 136* 120* 144* 104*       Signed:  Kyleah Pensabene  [facility] 09/26/2012, 2:09 PM

## 2012-09-26 NOTE — Progress Notes (Addendum)
Physical Therapy Evaluation Note - late entry for 09/24/12 at 1535.  Past Medical History  Diagnosis Date  . MS (multiple sclerosis)   . Diabetes type 2, uncontrolled   . Seizures   . Arthritis    Past Surgical History  Procedure Laterality Date  . Cholecystectomy    . Tubal ligation     Clinical impression: Pt greatly limited by pain requiring assist for all ADLs, transfers, and mobility. Pt to greatly benefit from ST-SNF placement to achieve mod I function for safe transition home. Fiance very reluctant to send pt to SNF reports "I"m getting the house ready." If patient d/c's home she will need a w/c as primary mode of mobility due to inability to walk at this time and report of falls 2x/wk. Pt will also need a ramp for safe home entry as she is unable to complete stair negotiation safely. Fiance works during the day and family can not provide 24/7 assist/supervision. Pt unsafe for d/c home and strongly recommend ST-SNF placement however family adamant taking pt home.   09/24/12 1535  PT Visit Information  Last PT Received On 09/24/12  Assistance Needed +2  History of Present Illness 42 y.o. female with hx of DM, poor compliance with meds due to lack of insurance, hx of seizure not on ACD due to cost contraint, recently Dx with MS though she still need LP to confirm, not on any meds for her presumped MS, presents to the ER with an episode of substernal CP.  Precautions  Precautions Sternal  Restrictions  Weight Bearing Restrictions No  Home Living  Family/patient expects to be discharged to: Private residence  Living Arrangements Spouse/significant other  Available Help at Discharge Available PRN/intermittently;Family  Type of [place] to enter  Entrance Stairs-Number of Steps 3-7  Entrance Stairs-Rails Left  Home Layout One level  [place] - quad  Prior Function  Level of [place] Needs assistance  Gait / Transfers Assistance Needed used  cane, minimal ambulation due to pain, reports falls 2x/week  ADL's / Homemaking Assistance Needed spouse assist with bathing, dressing, cooking, cleaning driving  Communication / Swallowing Assistance Needed no  Comments limited R UE use due to pain   Communication  Communication No difficulties  Cognition  Arousal/Alertness Awake/alert  Behavior During Therapy WFL for tasks assessed/performed  Overall Cognitive Status Within Functional Limits for tasks assessed  Upper Extremity Assessment  Upper Extremity Assessment Generalized weakness;RUE deficits/detail  RUE Deficits / Details unable to grip due to pain, R shld flx 45 deg actively due to pain  Lower Extremity Assessment  Lower Extremity Assessment Generalized weakness  Cervical / Trunk Assessment  Cervical / Trunk Assessment Normal  Bed Mobility  Bed Mobility Rolling Left;Left Sidelying to Sit  Rolling Left 4: Min assist  Left Sidelying to Sit 4: Min assist  Details for Bed Mobility Assistance limited R UE functional use, assist for trunk elevation  Transfers  Transfers Sit to Stand;Stand to Sit  Sit to Stand 3: Mod assist;With upper extremity assist;From bed  Stand to Sit 4: Min assist;With upper extremity assist;To chair/3-in-1  Details for Transfer Assistance increased time,v/c's for hand placement, increased pain  Ambulation/Gait  Ambulation/Gait Assistance 3: Mod assist  Ambulation Distance (Feet) 2 Feet  Assistive device Rolling walker  Ambulation/Gait Assistance Details pt unable to use R UE. pt c/o "i'm so winded." Pt unable to clear bilat LEs, using shuffled gait pattern. Pt unsafe to ambulate without assist due to incresaed  falls risk. Pt with increased trunk flexion t/o amb due to pain and fatigue. pt also c/o bilat ankle pain with ambulation.  Gait Pattern Step-to pattern;Shuffle  Gait velocity extremely slow  Stairs No  Balance  Balance Assessed Yes  Static Sitting Balance  Static Sitting - Balance Support No  upper extremity supported  Static Sitting - Level of Assistance 6: Modified independent (Device/Increase time)  Static Sitting - Comment/# of Minutes 5 min- able to don R sock but not left  PT - End of Session  Equipment Utilized During Treatment Gait belt  Activity Tolerance Patient limited by pain;Patient limited by fatigue  Patient left in chair;with call bell/phone within reach;with family/visitor present  Nurse Communication Mobility status (need for DME)  PT Assessment  PT Recommendation/Assessment Patient needs continued PT services  PT Problem List Decreased activity tolerance;Decreased strength;Decreased range of motion;Decreased balance;Decreased mobility  Barriers to Discharge Decreased caregiver support (home alone during the day)  PT Therapy Diagnosis  Difficulty walking;Acute pain  PT Plan  PT Frequency Min 3X/week  PT Treatment/Interventions DME instruction;Gait training;Stair training;Functional mobility training;Therapeutic exercise;Therapeutic activities  PT Recommendation  Follow Up Recommendations Home health PT;Supervision/Assistance - 24 hour  PT equipment Wheelchair (measurements PT);3in1 (PT) (tub bench/shower chair)  Individuals Consulted  Consulted and Agree with Results and Recommendations Patient  Acute Rehab PT Goals  Patient Stated Goal home  PT Goal Formulation With patient/family  Time For Goal Achievement 10/01/12  Potential to Achieve Goals Good  PT Time Calculation  PT Start Time 1535  PT Stop Time 1607  PT Time Calculation (min) 32 min  PT G-Codes **NOT FOR INPATIENT CLASS**  Functional Assessment Tool Used clinical judgment  Functional Limitation Mobility: Walking and moving around  Mobility: Walking and Moving Around Current Status (Q0086) CL  Mobility: Walking and Moving Around Goal Status (P6195) CI  PT General Charges  $$ ACUTE PT VISIT 1 Procedure  PT Evaluation  $Initial PT Evaluation Tier I 1 Procedure  PT Treatments  $Therapeutic  Activity 8-22 mins   Pain: 10/10  Kittie Plater, PT, DPT Pager #: [phone] Office #: [phone]

## 2012-09-26 NOTE — Progress Notes (Signed)
Physical Therapy Treatment Patient Details Name: Andrea Wade MRN: [id number] DOB: [birth year] Today's Date: 09/26/2012 Time: 3149-7026 PT Time Calculation (min): 24 min  PT Assessment / Plan / Recommendation  History of Present Illness 42 y.o. female with hx of DM, poor compliance with meds due to lack of insurance, hx of seizure not on ACD due to cost contraint, recently Dx with MS though she still need LP to confirm, not on any meds for her presumped MS, presents to the ER with an episode of substernal CP.   Clinical Impression Worked with patient for education on use of home devices, home exercises program, management of reported MS (per pt) and mobility. Patient with some improvements in function today as indicated. Still requires increased assist for some activities. Pt demonstrates ability to transfer to and from wheel chair with minimal assist. Discussed mobility tips and instructions for home assistance as needed. Pt very appreciative. Will continue to see as indicated.      Follow Up Recommendations  Home health PT;Supervision/Assistance - 24 hour           Equipment Recommendations  Wheelchair (measurements PT);3in1 (PT) (tub bench/shower chair)       Frequency Min 3X/week   Progress towards PT Goals  Progressing towards goals  Plan Current plan remains appropriate    Precautions / Restrictions Precautions Precautions: Sternal Restrictions Weight Bearing Restrictions: No   Pertinent Vitals/Pain 4/10 pain    Mobility  Bed Mobility Bed Mobility: Rolling Left;Left Sidelying to Sit Rolling Left: 4: Min assist Left Sidelying to Sit: 4: Min assist Details for Bed Mobility Assistance: limited R UE functional use, assist for trunk elevation Transfers Transfers: Sit to Stand;Stand to Sit;Stand Pivot Transfers Sit to Stand: 4: Min assist Stand to Sit: 4: Min assist;With upper extremity assist;To chair/3-in-1 Stand Pivot Transfers: 4: Min guard Details for Transfer  Assistance: VCs for rocker technique and hands on knees for support and push off Ambulation/Gait Ambulation/Gait Assistance: 4: Min assist Ambulation Distance (Feet): 4 Feet Gait Pattern: Step-to pattern;Shuffle Gait velocity: extremely slow Stairs: No      PT Goals (current goals can now be found in the care plan section) Acute Rehab PT Goals Patient Stated Goal: home PT Goal Formulation: With patient/family Time For Goal Achievement: 10/01/12 Potential to Achieve Goals: Good  Visit Information  Last PT Received On: 09/26/12 Assistance Needed: +2 History of Present Illness: 42 y.o. female with hx of DM, poor compliance with meds due to lack of insurance, hx of seizure not on ACD due to cost contraint, recently Dx with MS though she still need LP to confirm, not on any meds for her presumped MS, presents to the ER with an episode of substernal CP.    Subjective Data  Subjective: i am ready to go home Patient Stated Goal: home   Cognition  Cognition Arousal/Alertness: Awake/alert Behavior During Therapy: WFL for tasks assessed/performed Overall Cognitive Status: Within Functional Limits for tasks assessed    Balance  Balance Balance Assessed: Yes Static Sitting Balance Static Sitting - Balance Support: No upper extremity supported Static Sitting - Level of Assistance: 6: Modified independent (Device/Increase time)  End of Session PT - End of Session Equipment Utilized During Treatment: Gait belt Activity Tolerance: Patient limited by pain;Patient limited by fatigue Patient left: in bed;with call bell/phone within reach Nurse Communication: Mobility status (need for DME)   GP     Duncan Dull 09/26/2012, 4:31 PM Alben Deeds, [place] DPT  [phone]

## 2012-09-27 LAB — CSF CULTURE W GRAM STAIN: Culture: NO GROWTH

## 2012-11-08 ENCOUNTER — Encounter: Payer: Self-pay | Admitting: Diagnostic Neuroimaging

## 2012-11-08 ENCOUNTER — Ambulatory Visit (INDEPENDENT_AMBULATORY_CARE_PROVIDER_SITE_OTHER): Payer: Medicaid Other | Admitting: Diagnostic Neuroimaging

## 2012-11-08 VITALS — BP 129/83 | HR 91 | Temp 98.0°F | Ht 68.25 in | Wt 210.0 lb

## 2012-11-08 DIAGNOSIS — R569 Unspecified convulsions: Secondary | ICD-10-CM

## 2012-11-08 DIAGNOSIS — R55 Syncope and collapse: Secondary | ICD-10-CM

## 2012-11-08 DIAGNOSIS — R531 Weakness: Secondary | ICD-10-CM

## 2012-11-08 DIAGNOSIS — R5381 Other malaise: Secondary | ICD-10-CM

## 2012-11-08 DIAGNOSIS — M79609 Pain in unspecified limb: Secondary | ICD-10-CM

## 2012-11-08 NOTE — Progress Notes (Signed)
[facility]  PATIENT: Andrea Wade DOB: 1970-09-18  REFERRING CLINICIAN: Karleen Hampshire HISTORY FROM: patient, fiance, daughter REASON FOR VISIT: new consult   HISTORICAL  CHIEF COMPLAINT:  Chief Complaint  Patient presents with  . MS, atypical chest pain    HISTORY OF PRESENT ILLNESS:   42 year old female with diabetes, migraine, depression, anxiety, Crohn's disease, lumbar spine mass, or for evaluation of multiple sclerosis.  In June 2014 patient passed out at home patient went to [facility] and was diagnosed with possible seizure. Also patient was diagnosed with possible multiple sclerosis based on MRI. However she did not followup neurology.  July patient was admitted to the hospital for chest pain the event. During this time she had lumbar puncture to further evaluate for multiple sclerosis. Patient presents to this office visit for further evaluation and followup of test results.  Also since July 2014 patient has been having constellation of other symptoms including diffuse joint pain, joint swelling, incontinence, weakness, memory loss, malaise.  REVIEW OF SYSTEMS: Full 14 system review of systems performed and notable only for fevers chills weight loss fatigue ringing in ear spinning sensation itching diarrhea cough eye pain feeling hot uncal joint pain joint swelling tried taking muscle running nose consisted of a racing thoughts this interest in activities change in appetite decreased energy not asleep anxiety depression tremor passing out seizure dizziness numbness weakness headache memory loss insomnia sleepiness.  ALLERGIES: Allergies  Allergen Reactions  . Codeine Hives, Itching and Palpitations  . Peanut-Containing Drug Products Anaphylaxis  . Penicillins Hives, Itching and Nausea And Vomiting    HOME MEDICATIONS: Prior to Admission medications   Medication Sig Start Date End Date Taking? Authorizing Provider  ALPRAZolam (XANAX) 0.25 MG  tablet Take 1 tablet (0.25 mg total) by mouth daily as needed for anxiety. 09/26/12  Yes Hosie Poisson, MD  aspirin EC 81 MG tablet Take 1 tablet (81 mg total) by mouth daily. 09/26/12  Yes Hosie Poisson, MD  glipiZIDE (GLUCOTROL) 5 MG tablet Take 1 tablet (5 mg total) by mouth 2 (two) times daily before a meal. 09/26/12  Yes Hosie Poisson, MD  Lancets (ONETOUCH ULTRASOFT) lancets Use as instructed 09/26/12  Yes Hosie Poisson, MD  levETIRAcetam (KEPPRA) 500 MG tablet Take 1 tablet (500 mg total) by mouth daily. 09/26/12  Yes Hosie Poisson, MD  metFORMIN (GLUCOPHAGE) 500 MG tablet Take 1 tablet (500 mg total) by mouth 2 (two) times daily with a meal. 09/26/12  Yes Hosie Poisson, MD  oxyCODONE-acetaminophen (PERCOCET/ROXICET) 5-325 MG per tablet Take 2 tablets by mouth every 4 (four) hours as needed. 09/26/12  Yes Hosie Poisson, MD  promethazine (PHENERGAN) 12.5 MG tablet Take 1 tablet (12.5 mg total) by mouth every 6 (six) hours as needed. 09/26/12  Yes Hosie Poisson, MD   Outpatient Prescriptions Prior to Visit  Medication Sig Dispense Refill  . ALPRAZolam (XANAX) 0.25 MG tablet Take 1 tablet (0.25 mg total) by mouth daily as needed for anxiety.  15 tablet  0  . aspirin EC 81 MG tablet Take 1 tablet (81 mg total) by mouth daily.  30 tablet  1  . glipiZIDE (GLUCOTROL) 5 MG tablet Take 1 tablet (5 mg total) by mouth 2 (two) times daily before a meal.  60 tablet  0  . Lancets (ONETOUCH ULTRASOFT) lancets Use as instructed  100 each  1  . levETIRAcetam (KEPPRA) 500 MG tablet Take 1 tablet (500 mg total) by mouth daily.  30 tablet  1  . metFORMIN (  GLUCOPHAGE) 500 MG tablet Take 1 tablet (500 mg total) by mouth 2 (two) times daily with a meal.  60 tablet  1  . oxyCODONE-acetaminophen (PERCOCET/ROXICET) 5-325 MG per tablet Take 2 tablets by mouth every 4 (four) hours as needed.  30 tablet  0  . promethazine (PHENERGAN) 12.5 MG tablet Take 1 tablet (12.5 mg total) by mouth every 6 (six) hours as needed.  15 tablet  0  .  feeding supplement (GLUCERNA SHAKE) LIQD Take 237 mLs by mouth 2 (two) times daily between meals.    0  . docusate sodium 100 MG CAPS Take 100 mg by mouth 2 (two) times daily.  10 capsule  0   No facility-administered medications prior to visit.    PAST MEDICAL HISTORY: Past Medical History  Diagnosis Date  . Diabetes type 2, uncontrolled   . Seizures   . Arthritis     PAST SURGICAL HISTORY: Past Surgical History  Procedure Laterality Date  . Cholecystectomy    . Tubal ligation      FAMILY HISTORY: No family history on file.  SOCIAL HISTORY:  History   Social History  . Marital Status: Single    Spouse Name: N/A    Number of Children: 2  . Years of Education: 11   Occupational History  . N/A    Social History Main Topics  . Smoking status: Current Every Day Smoker -- 0.50 packs/day    Types: Cigarettes  . Smokeless tobacco: Not on file  . Alcohol Use: No  . Drug Use: No  . Sexual Activity: Yes    Partners: Male   Other Topics Concern  . Not on file   Social History Narrative  . No narrative on file     PHYSICAL EXAM  Filed Vitals:   11/08/12 1115  BP: 129/83  Pulse: 91  Temp: 98 F (36.7 C)  TempSrc: Oral  Height: 5' 8.25" (1.734 m)  Weight: 210 lb (95.255 kg)    Not recorded    Body mass index is 31.68 kg/(m^2).  GENERAL EXAM: IN WHEELCHAIR. SLOW RESPONSES.  CARDIOVASCULAR: Regular rate and rhythm, no murmurs, no carotid bruits  NEUROLOGIC: MENTAL STATUS: awake, alert, language fluent, comprehension intact, naming intact CRANIAL NERVE: no papilledema on fundoscopic exam, pupils equal and reactive to light, visual fields full to confrontation, extraocular muscles intact, no nystagmus, facial sensation and strength symmetric, uvula midline, shoulder shrug symmetric, tongue midline. MOTOR: DIFFUSE WEAKNESS (4/5). LIMITED BY PAIN. SENSORY: ABSENT VIB AT TOES. COORDINATION: finger-nose-finger, fine finger movements normal REFLEXES: BUE 2,  KNEES 1, ANKLES 0. GAIT/STATION: IN WHEELCHAIR.   DIAGNOSTIC DATA (LABS, IMAGING, TESTING) - I reviewed patient records, labs, notes, testing and imaging myself where available.  Lab Results  Component Value Date   WBC 7.6 09/25/2012   HGB 10.6* 09/25/2012   HCT 32.3* 09/25/2012   MCV 72.6* 09/25/2012   PLT 380 09/25/2012      Component Value Date/Time   NA 135 09/25/2012 0512   K 3.5 09/25/2012 0512   CL 100 09/25/2012 0512   CO2 26 09/25/2012 0512   GLUCOSE 143* 09/25/2012 0512   BUN 9 09/25/2012 0512   CREATININE 0.48* 09/25/2012 0512   CALCIUM 9.0 09/25/2012 0512   PROT 8.0 09/21/2012 2041   ALBUMIN 2.9* 09/21/2012 2041   AST 21 09/21/2012 2041   ALT 10 09/21/2012 2041   ALKPHOS 89 09/21/2012 2041   BILITOT 0.3 09/21/2012 2041   GFRNONAA >90 09/25/2012 0512   GFRAA >90  09/25/2012 0512   No results found for this basename: CHOL,  HDL,  LDLCALC,  LDLDIRECT,  TRIG,  CHOLHDL   Lab Results  Component Value Date   HGBA1C 8.8* 09/22/2012   Lab Results  Component Value Date   VITAMINB12 418 09/22/2012   Lab Results  Component Value Date   TSH 1.406 09/22/2012    CSF (WBC 1, RBC 10, PROTEIN 28, GLUCOSE 99, OCB NEGATIVE)   ASSESSMENT AND PLAN  42 y.o. year old female here with diffuse pain, seizure, syncope. Not likely multiple sclerosis based on LP results. Would like to review MRI results and outside records.  PLAN: - check EEG - check EMG/NCS   Orders Placed This Encounter  Procedures  . NCV with EMG(electromyography)  . EEG adult     Penni Bombard, MD 0/15/6153, 7:94 PM Certified in Neurology, Neurophysiology and [facility] [address], [place], [place] [address] [phone]

## 2012-11-11 ENCOUNTER — Telehealth: Payer: Self-pay | Admitting: Diagnostic Neuroimaging

## 2012-11-11 ENCOUNTER — Encounter: Payer: Self-pay | Admitting: Diagnostic Neuroimaging

## 2012-11-14 NOTE — Telephone Encounter (Signed)
Patient requesting to know if Dr. Leta Baptist received EEG results from [facility]. Would like to setup NCV/EMG also. Having pain and stiffness in neck. Requesting med to help with sx(s).

## 2013-01-02 ENCOUNTER — Emergency Department (HOSPITAL_COMMUNITY): Payer: Medicaid Other

## 2013-01-02 ENCOUNTER — Encounter (HOSPITAL_COMMUNITY): Payer: Self-pay | Admitting: Emergency Medicine

## 2013-01-02 ENCOUNTER — Emergency Department (HOSPITAL_COMMUNITY)
Admission: EM | Admit: 2013-01-02 | Discharge: 2013-01-02 | Disposition: A | Discharge status: Home / Self Care | Payer: Medicaid Other | Attending: Emergency Medicine | Admitting: Emergency Medicine

## 2013-01-02 DIAGNOSIS — R000 Tachycardia, unspecified: Secondary | ICD-10-CM | POA: Insufficient documentation

## 2013-01-02 DIAGNOSIS — Z885 Allergy status to narcotic agent status: Secondary | ICD-10-CM | POA: Insufficient documentation

## 2013-01-02 DIAGNOSIS — Z880 Allergy status to penicillin: Secondary | ICD-10-CM | POA: Insufficient documentation

## 2013-01-02 DIAGNOSIS — M129 Arthropathy, unspecified: Secondary | ICD-10-CM | POA: Insufficient documentation

## 2013-01-02 DIAGNOSIS — R569 Unspecified convulsions: Secondary | ICD-10-CM | POA: Insufficient documentation

## 2013-01-02 DIAGNOSIS — Z7982 Long term (current) use of aspirin: Secondary | ICD-10-CM | POA: Insufficient documentation

## 2013-01-02 DIAGNOSIS — R0602 Shortness of breath: Secondary | ICD-10-CM | POA: Insufficient documentation

## 2013-01-02 DIAGNOSIS — K5090 Crohn's disease, unspecified, without complications: Secondary | ICD-10-CM | POA: Insufficient documentation

## 2013-01-02 DIAGNOSIS — F17200 Nicotine dependence, unspecified, uncomplicated: Secondary | ICD-10-CM | POA: Insufficient documentation

## 2013-01-02 DIAGNOSIS — E860 Dehydration: Secondary | ICD-10-CM | POA: Insufficient documentation

## 2013-01-02 DIAGNOSIS — E119 Type 2 diabetes mellitus without complications: Secondary | ICD-10-CM | POA: Insufficient documentation

## 2013-01-02 DIAGNOSIS — R112 Nausea with vomiting, unspecified: Secondary | ICD-10-CM | POA: Insufficient documentation

## 2013-01-02 DIAGNOSIS — R072 Precordial pain: Secondary | ICD-10-CM | POA: Insufficient documentation

## 2013-01-02 DIAGNOSIS — R0789 Other chest pain: Secondary | ICD-10-CM

## 2013-01-02 DIAGNOSIS — Z79899 Other long term (current) drug therapy: Secondary | ICD-10-CM | POA: Insufficient documentation

## 2013-01-02 DIAGNOSIS — R42 Dizziness and giddiness: Secondary | ICD-10-CM | POA: Insufficient documentation

## 2013-01-02 HISTORY — DX: Crohn's disease, unspecified, without complications: K50.90

## 2013-01-02 LAB — CBC
HCT: 39.4 % (ref 36.0–46.0)
Hemoglobin: 13.4 g/dL (ref 12.0–15.0)
MCH: 24.4 pg — ABNORMAL LOW (ref 26.0–34.0)
MCV: 71.6 fL — ABNORMAL LOW (ref 78.0–100.0)
RBC: 5.5 MIL/uL — ABNORMAL HIGH (ref 3.87–5.11)

## 2013-01-02 LAB — POCT I-STAT TROPONIN I: Troponin i, poc: 0 ng/mL (ref 0.00–0.08)

## 2013-01-02 LAB — GLUCOSE, CAPILLARY: Glucose-Capillary: 262 mg/dL — ABNORMAL HIGH (ref 70–99)

## 2013-01-02 LAB — HEPATIC FUNCTION PANEL
Albumin: 3.5 g/dL (ref 3.5–5.2)
Alkaline Phosphatase: 101 U/L (ref 39–117)
Total Bilirubin: 0.5 mg/dL (ref 0.3–1.2)

## 2013-01-02 LAB — BASIC METABOLIC PANEL
BUN: 8 mg/dL (ref 6–23)
CO2: 19 mEq/L (ref 19–32)
Calcium: 9.5 mg/dL (ref 8.4–10.5)
Chloride: 95 mEq/L — ABNORMAL LOW (ref 96–112)
Creatinine, Ser: 0.39 mg/dL — ABNORMAL LOW (ref 0.50–1.10)
GFR calc Af Amer: >90 mL/min (ref >90)
Glucose, Bld: 252 mg/dL — ABNORMAL HIGH (ref 70–99)
Sodium: 130 mEq/L — ABNORMAL LOW (ref 135–145)

## 2013-01-02 MED ORDER — SODIUM CHLORIDE 0.9 % IV BOLUS (SEPSIS)
1000.0000 mL | Freq: Once | INTRAVENOUS | Status: AC
  Administered 2013-01-02: 1000 mL via INTRAVENOUS

## 2013-01-02 MED ORDER — OXYCODONE-ACETAMINOPHEN 5-325 MG PO TABS: 2.0000 | ORAL_TABLET | Freq: Four times a day (QID) | ORAL | Status: DC | PRN

## 2013-01-02 MED ORDER — HYDROMORPHONE HCL PF 1 MG/ML IJ SOLN
1.0000 mg | Freq: Once | INTRAMUSCULAR | Status: AC
  Administered 2013-01-02: 1 mg via INTRAVENOUS
  Filled 2013-01-02: qty 1

## 2013-01-02 MED ORDER — ONDANSETRON HCL 4 MG/2ML IJ SOLN
4.0000 mg | Freq: Once | INTRAMUSCULAR | Status: AC
  Administered 2013-01-02: 4 mg via INTRAVENOUS
  Filled 2013-01-02: qty 2

## 2013-01-02 MED ORDER — GI COCKTAIL ~~LOC~~
30.0000 mL | Freq: Once | ORAL | Status: AC
  Administered 2013-01-02: 30 mL via ORAL
  Filled 2013-01-02: qty 30

## 2013-01-02 MED ORDER — PROMETHAZINE HCL 25 MG PO TABS: 25.0000 mg | ORAL_TABLET | Freq: Four times a day (QID) | ORAL | Status: DC | PRN

## 2013-01-02 MED ORDER — DIPHENHYDRAMINE HCL 50 MG/ML IJ SOLN
25.0000 mg | Freq: Once | INTRAMUSCULAR | Status: AC
  Administered 2013-01-02: 25 mg via INTRAVENOUS
  Filled 2013-01-02: qty 1

## 2013-01-02 NOTE — ED Notes (Signed)
Pt in room shaking and nonverbal but making eye contact with this RN.  Pt currently tachypnic on the monitor.

## 2013-01-02 NOTE — ED Notes (Signed)
Patient with chest pain that started this afternoon.  Patient states that it is a burning sensation in the center of her chest.  The pain does radiate to her back.  Patient states she feels like she is going to pass out.  She has shortness of breath, lightheadedness and nausea associated with the pain.

## 2013-01-02 NOTE — ED Provider Notes (Signed)
CSN: [id number]     Arrival date & time 01/02/13  1930 History   First MD Initiated Contact with Patient 01/02/13 1939     Chief Complaint  Patient presents with  . Chest Pain  . Shortness of Breath   (Consider location/radiation/quality/duration/timing/severity/associated sxs/prior Treatment) The history is provided by the patient.  Andrea Wade is a 42 y.o. female history of diabetes, seizure, Crohn's disease, possible MS (even though recent LP showed no oligoclonal bands), chronic back pain, presenting with chest pain. Substernal chest pain is burning sensation since this afternoon. Sometimes it radiates to her back. She felt nauseous and vomited twice today. Denies any fevers or chills. She feels short of breath and feels lightheaded as if she is going to pass out. She was seen and admitted in the ER 3 months ago for atypical chest pain that similar to today. She had echo that showed grade 1 diastolic dysfunction and had elevated dimer but CT angio chest showed no PE.    Past Medical History  Diagnosis Date  . Diabetes type 2, uncontrolled   . Seizures   . Arthritis   . Crohn disease    Past Surgical History  Procedure Laterality Date  . Cholecystectomy    . Tubal ligation     Family History  Problem Relation Age of Onset  . Diabetes Mother   . Heart disease Father    History  Substance Use Topics  . Smoking status: Current Every Day Smoker -- 0.50 packs/day    Types: Cigarettes  . Smokeless tobacco: Not on file  . Alcohol Use: No   OB History   Grav Para Term Preterm Abortions TAB SAB Ect Mult Living                 Review of Systems  Respiratory: Positive for shortness of breath.   Cardiovascular: Positive for chest pain.  All other systems reviewed and are negative.    Allergies  Codeine; Peanut-containing drug products; and Penicillins  Home Medications   Current Outpatient Rx  Name  Route  Sig  Dispense  Refill  . ALPRAZolam (XANAX) 0.25 MG tablet   Oral   Take 0.25 mg by mouth at bedtime as needed for sleep or anxiety.         Marland Kitchen aspirin EC 81 MG tablet   Oral   Take 81 mg by mouth daily.         Marland Kitchen glipiZIDE (GLUCOTROL) 5 MG tablet   Oral   Take 5 mg by mouth 2 (two) times daily before a meal.         . levETIRAcetam (KEPPRA) 500 MG tablet   Oral   Take 500 mg by mouth every 12 (twelve) hours.         . metFORMIN (GLUCOPHAGE) 500 MG tablet   Oral   Take 500 mg by mouth daily with breakfast.          BP 149/90  Pulse 96  Temp(Src) 98 F (36.7 C) (Oral)  Resp 16  Ht 5' 8"  (1.727 m)  Wt 209 lb 12.8 oz (95.165 kg)  BMI 31.91 kg/m2  SpO2 99%  LMP 12/09/2012 Physical Exam  Nursing note and vitals reviewed. Constitutional: She is oriented to person, place, and time.  Tired, chronically ill, uncomfortable   HENT:  Head: Normocephalic.  MM dry   Eyes: Conjunctivae are normal. Pupils are equal, round, and reactive to light.  Neck: Normal range of motion. Neck supple.  Cardiovascular: Normal  rate, regular rhythm and normal heart sounds.   Pulmonary/Chest: Effort normal and breath sounds normal. No respiratory distress. She has no wheezes. She has no rales.  Pain not reproducible   Abdominal: Soft. Bowel sounds are normal. She exhibits no distension. There is no tenderness. There is no rebound and no guarding.  Musculoskeletal: Normal range of motion. She exhibits no edema and no tenderness.  Neurological: She is alert and oriented to person, place, and time.  Skin: Skin is warm and dry.  Psychiatric: She has a normal mood and affect. Her behavior is normal. Judgment and thought content normal.    ED Course  Procedures (including critical care time) Labs Review Labs Reviewed  CBC - Abnormal; Notable for the following:    WBC 13.4 (*)    RBC 5.50 (*)    MCV 71.6 (*)    MCH 24.4 (*)    RDW 16.1 (*)    Platelets 455 (*)    All other components within normal limits  BASIC METABOLIC PANEL - Abnormal;  Notable for the following:    Sodium 130 (*)    Chloride 95 (*)    Glucose, Bld 252 (*)    Creatinine, Ser 0.39 (*)    All other components within normal limits  HEPATIC FUNCTION PANEL - Abnormal; Notable for the following:    Total Protein 9.2 (*)    All other components within normal limits  GLUCOSE, CAPILLARY - Abnormal; Notable for the following:    Glucose-Capillary 262 (*)    All other components within normal limits  GLUCOSE, CAPILLARY - Abnormal; Notable for the following:    Glucose-Capillary 145 (*)    All other components within normal limits  PRO B NATRIURETIC PEPTIDE  LIPASE, BLOOD  POCT I-STAT TROPONIN I  POCT I-STAT TROPONIN I   Imaging Review Dg Chest 2 View  01/02/2013   CLINICAL DATA:  42 year old female with chest pain shortness of breath dizziness weakness. Initial encounter.  EXAM: CHEST  2 VIEW  COMPARISON:  Chest CTA 09/22/2012 and earlier.  FINDINGS: Seated AP and lateral views of the chest. Lung volumes within normal limits. Normal cardiac size and mediastinal contours. Visualized tracheal air column is within normal limits. The lungs remain clear. No pneumothorax or effusion. No acute osseous abnormality identified.  IMPRESSION: Negative, no acute cardiopulmonary abnormality.   Electronically Signed   By: Lars Pinks M.D.   On: 01/02/2013 20:25    EKG Interpretation     Ventricular Rate:  119 PR Interval:  150 QRS Duration: 78 QT Interval:  316 QTC Calculation: 444 R Axis:   -87 Text Interpretation:  Sinus tachycardia Left axis deviation Pulmonary disease pattern Nonspecific ST abnormality Abnormal ECG Rate faster since previous tracing             MDM  No diagnosis found. Andrea Wade is a 42 y.o. female here with dehydration, chest pain. Chest pain atypical for ACS. Likely reflux vs GERD vs DKA. I don't think she has PE and she had elevated d-dimer previously so will not use d-dimer for screening. Will check labs, CBG, trop x 2. Will hydrate  patient and reassess.    11PM Patient's pain improved. Trop neg x 2. Labs at baseline. CBG dec from 252 to 145 with IVF. I think she has worsening chronic pain and dehydration. Will give short course of percocet and phenergan. Stable for d/c.    Wandra Arthurs, MD 01/02/13 [phone]

## 2013-01-06 ENCOUNTER — Telehealth: Payer: Self-pay | Admitting: Diagnostic Neuroimaging

## 2013-01-06 NOTE — Telephone Encounter (Signed)
Tests ordered. I don't have [place] [facility]. -VRP

## 2013-02-24 ENCOUNTER — Emergency Department (HOSPITAL_COMMUNITY): Payer: Medicaid Other

## 2013-02-24 ENCOUNTER — Encounter (HOSPITAL_COMMUNITY): Payer: Self-pay | Admitting: Emergency Medicine

## 2013-02-24 ENCOUNTER — Emergency Department (HOSPITAL_COMMUNITY)
Admission: EM | Admit: 2013-02-24 | Discharge: 2013-02-25 | Disposition: A | Discharge status: Home / Self Care | Payer: Medicaid Other | Attending: Emergency Medicine | Admitting: Emergency Medicine

## 2013-02-24 DIAGNOSIS — F29 Unspecified psychosis not due to a substance or known physiological condition: Secondary | ICD-10-CM | POA: Insufficient documentation

## 2013-02-24 DIAGNOSIS — Z7982 Long term (current) use of aspirin: Secondary | ICD-10-CM | POA: Insufficient documentation

## 2013-02-24 DIAGNOSIS — IMO0001 Reserved for inherently not codable concepts without codable children: Secondary | ICD-10-CM | POA: Insufficient documentation

## 2013-02-24 DIAGNOSIS — M129 Arthropathy, unspecified: Secondary | ICD-10-CM | POA: Insufficient documentation

## 2013-02-24 DIAGNOSIS — F17200 Nicotine dependence, unspecified, uncomplicated: Secondary | ICD-10-CM | POA: Insufficient documentation

## 2013-02-24 DIAGNOSIS — Z3202 Encounter for pregnancy test, result negative: Secondary | ICD-10-CM | POA: Insufficient documentation

## 2013-02-24 DIAGNOSIS — G40909 Epilepsy, unspecified, not intractable, without status epilepticus: Secondary | ICD-10-CM | POA: Insufficient documentation

## 2013-02-24 DIAGNOSIS — Z880 Allergy status to penicillin: Secondary | ICD-10-CM | POA: Insufficient documentation

## 2013-02-24 DIAGNOSIS — R5381 Other malaise: Secondary | ICD-10-CM | POA: Insufficient documentation

## 2013-02-24 DIAGNOSIS — R0789 Other chest pain: Secondary | ICD-10-CM

## 2013-02-24 DIAGNOSIS — Z8719 Personal history of other diseases of the digestive system: Secondary | ICD-10-CM | POA: Insufficient documentation

## 2013-02-24 DIAGNOSIS — R569 Unspecified convulsions: Secondary | ICD-10-CM | POA: Diagnosis present

## 2013-02-24 DIAGNOSIS — Z79899 Other long term (current) drug therapy: Secondary | ICD-10-CM | POA: Insufficient documentation

## 2013-02-24 LAB — URINALYSIS, ROUTINE W REFLEX MICROSCOPIC
Bilirubin Urine: NEGATIVE
Hgb urine dipstick: NEGATIVE
Ketones, ur: NEGATIVE mg/dL
Nitrite: NEGATIVE
Specific Gravity, Urine: 1.011 (ref 1.005–1.030)
pH: 7.5 (ref 5.0–8.0)

## 2013-02-24 LAB — CBC
HCT: 33.4 % — ABNORMAL LOW (ref 36.0–46.0)
Hemoglobin: 11 g/dL — ABNORMAL LOW (ref 12.0–15.0)
MCH: 24.2 pg — ABNORMAL LOW (ref 26.0–34.0)
MCHC: 32.9 g/dL (ref 30.0–36.0)
MCV: 73.4 fL — ABNORMAL LOW (ref 78.0–100.0)
RDW: 15.7 % — ABNORMAL HIGH (ref 11.5–15.5)
WBC: 8.7 10*3/uL (ref 4.0–10.5)

## 2013-02-24 LAB — POCT I-STAT TROPONIN I: Troponin i, poc: 0 ng/mL (ref 0.00–0.08)

## 2013-02-24 LAB — PREGNANCY, URINE: Preg Test, Ur: NEGATIVE

## 2013-02-24 LAB — BASIC METABOLIC PANEL
BUN: 5 mg/dL — ABNORMAL LOW (ref 6–23)
CO2: 27 mEq/L (ref 19–32)
Creatinine, Ser: 0.4 mg/dL — ABNORMAL LOW (ref 0.50–1.10)
GFR calc Af Amer: >90 mL/min (ref >90)
GFR calc non Af Amer: >90 mL/min (ref >90)
Glucose, Bld: 216 mg/dL — ABNORMAL HIGH (ref 70–99)
Sodium: 133 mEq/L — ABNORMAL LOW (ref 135–145)

## 2013-02-24 LAB — POCT PREGNANCY, URINE: Preg Test, Ur: NEGATIVE

## 2013-02-24 MED ORDER — OXYCODONE-ACETAMINOPHEN 5-325 MG PO TABS: 1.0000 | ORAL_TABLET | Freq: Four times a day (QID) | ORAL | Status: DC | PRN

## 2013-02-24 MED ORDER — OXYCODONE-ACETAMINOPHEN 5-325 MG PO TABS
1.0000 | ORAL_TABLET | Freq: Once | ORAL | Status: AC
  Administered 2013-02-24: 1 via ORAL
  Filled 2013-02-24: qty 1

## 2013-02-24 MED ORDER — DIPHENHYDRAMINE HCL 50 MG/ML IJ SOLN: 12.5000 mg | Freq: Once | INTRAMUSCULAR | Status: DC

## 2013-02-24 MED ORDER — LEVETIRACETAM 500 MG PO TABS: 500.0000 mg | ORAL_TABLET | Freq: Two times a day (BID) | ORAL | Status: DC

## 2013-02-24 MED ORDER — SODIUM CHLORIDE 0.9 % IV BOLUS (SEPSIS): 1000.0000 mL | INTRAVENOUS | Status: DC

## 2013-02-24 MED ORDER — METOCLOPRAMIDE HCL 5 MG/ML IJ SOLN: 5.0000 mg | Freq: Once | INTRAMUSCULAR | Status: DC

## 2013-02-24 MED ORDER — LEVETIRACETAM 500 MG PO TABS
500.0000 mg | ORAL_TABLET | Freq: Once | ORAL | Status: AC
  Administered 2013-02-24: 500 mg via ORAL
  Filled 2013-02-24: qty 1

## 2013-02-24 NOTE — ED Notes (Signed)
Per EMS, pt had a seizure yesterday. Husband called EMS because he was concerned she would have another seizure. EMS states that pt had a seizure 5 minutes from hospital while en route, which was relieved by an ammonia capsule. Pt states she has not taken keppra in several months, due to her neurologist not prescribing it for her. Pt also complains of chest pain that radiates to left arm. Pt also has hx of MS and DM.

## 2013-02-24 NOTE — ED Notes (Signed)
Bed: [facility] Expected date:  Expected time:  Means of arrival:  Comments: EMS

## 2013-02-24 NOTE — ED Notes (Signed)
Blood sugar 198

## 2013-02-24 NOTE — ED Provider Notes (Signed)
CSN: [id number]     Arrival date & time 02/24/13  1753 History   First MD Initiated Contact with Patient 02/24/13 2045     Chief Complaint  Patient presents with  . Seizures   (Consider location/radiation/quality/duration/timing/severity/associated sxs/prior Treatment) Patient is a 42 y.o. female presenting with seizures. The history is provided by the patient and the spouse.  Seizures Seizure type: tonic/clonic. Initial focality:  None Episode characteristics: generalized shaking   Postictal symptoms: confusion   Return to baseline: no   Severity:  Mild Duration: seizure last night and tonight both lasted less than 3 minutes. Timing:  Once Number of seizures this episode:  1 Progression:  Resolved   Past Medical History  Diagnosis Date  . Diabetes type 2, uncontrolled   . Seizures   . Arthritis   . Crohn disease    Past Surgical History  Procedure Laterality Date  . Cholecystectomy    . Tubal ligation     Family History  Problem Relation Age of Onset  . Diabetes Mother   . Heart disease Father    History  Substance Use Topics  . Smoking status: Current Every Day Smoker -- 0.50 packs/day    Types: Cigarettes  . Smokeless tobacco: Not on file  . Alcohol Use: No   OB History   Grav Para Term Preterm Abortions TAB SAB Ect Mult Living                 Review of Systems  Constitutional: Negative for fever and fatigue.  HENT: Negative for congestion and drooling.   Eyes: Negative for pain.  Respiratory: Negative for cough and shortness of breath.   Cardiovascular: Positive for chest pain (left sided).  Gastrointestinal: Negative for nausea, vomiting, abdominal pain and diarrhea.  Genitourinary: Negative for dysuria and hematuria.  Musculoskeletal: Negative for back pain, gait problem and neck pain.  Skin: Negative for color change.  Neurological: Positive for seizures and weakness (generalized). Negative for dizziness and headaches.  Hematological: Negative for  adenopathy.  Psychiatric/Behavioral: Negative for behavioral problems.  All other systems reviewed and are negative.    Allergies  Codeine; Peanut-containing drug products; and Penicillins  Home Medications   Current Outpatient Rx  Name  Route  Sig  Dispense  Refill  . aspirin EC 81 MG tablet   Oral   Take 81 mg by mouth daily.         Marland Kitchen glipiZIDE (GLUCOTROL) 5 MG tablet   Oral   Take 5 mg by mouth 2 (two) times daily before a meal.         . ibuprofen (ADVIL,MOTRIN) 200 MG tablet   Oral   Take 400 mg by mouth at bedtime as needed for moderate pain.         . metFORMIN (GLUCOPHAGE) 500 MG tablet   Oral   Take 500 mg by mouth 2 (two) times daily with a meal.          . naproxen sodium (ANAPROX) 220 MG tablet   Oral   Take 220-440 mg by mouth 2 (two) times daily. Two in the morning and 1 at night         . ALPRAZolam (XANAX) 0.25 MG tablet   Oral   Take 0.25 mg by mouth at bedtime as needed for sleep or anxiety.         . levETIRAcetam (KEPPRA) 500 MG tablet   Oral   Take 500 mg by mouth every 12 (twelve) hours.  LMP 01/30/2013 Physical Exam  Nursing note and vitals reviewed. Constitutional: She is oriented to person, place, and time. She appears well-developed and well-nourished.  HENT:  Head: Normocephalic.  Mouth/Throat: Oropharynx is clear and moist. No oropharyngeal exudate.  Eyes: Conjunctivae and EOM are normal. Pupils are equal, round, and reactive to light.  Neck: Normal range of motion. Neck supple.  Cardiovascular: Normal rate, regular rhythm, normal heart sounds and intact distal pulses.  Exam reveals no gallop and no friction rub.   No murmur heard. Pulmonary/Chest: Effort normal and breath sounds normal. No respiratory distress. She has no wheezes.  Abdominal: Soft. Bowel sounds are normal. There is no tenderness. There is no rebound and no guarding.  Musculoskeletal: Normal range of motion. She exhibits no edema and no  tenderness.  Neurological: She is alert and oriented to person, place, and time. She has normal strength. No cranial nerve deficit or sensory deficit. Coordination normal.  Patient to weak to ambulate. No focal motor deficits noted on exam.  Normal understanding and speech.  Normal finger to nose bilaterally.  Skin: Skin is warm and dry.  Psychiatric: She has a normal mood and affect. Her behavior is normal.    ED Course  Procedures (including critical care time) Labs Review Labs Reviewed  GLUCOSE, CAPILLARY - Abnormal; Notable for the following:    Glucose-Capillary 198 (*)    All other components within normal limits  BASIC METABOLIC PANEL - Abnormal; Notable for the following:    Sodium 133 (*)    Potassium 3.4 (*)    Glucose, Bld 216 (*)    BUN 5 (*)    Creatinine, Ser 0.40 (*)    All other components within normal limits  CBC - Abnormal; Notable for the following:    Hemoglobin 11.0 (*)    HCT 33.4 (*)    MCV 73.4 (*)    MCH 24.2 (*)    RDW 15.7 (*)    Platelets 457 (*)    All other components within normal limits  URINALYSIS, ROUTINE W REFLEX MICROSCOPIC - Abnormal; Notable for the following:    Glucose, UA 500 (*)    All other components within normal limits  PREGNANCY, URINE  POCT PREGNANCY, URINE  POCT I-STAT TROPONIN I   Imaging Review Dg Chest 2 View  02/24/2013   CLINICAL DATA:  Seizure; shortness of breath, cough and weakness. History of smoking.  EXAM: CHEST  2 VIEW  COMPARISON:  Chest radiograph performed 01/02/2013  FINDINGS: The lungs are well-aerated and clear. There is no evidence of focal opacification, pleural effusion or pneumothorax.  The heart is normal in size; the mediastinal contour is within normal limits. No acute osseous abnormalities are seen.  IMPRESSION: No acute cardiopulmonary process seen.   Electronically Signed   By: Garald Balding M.D.   On: 02/24/2013 21:10    EKG Interpretation    Date/Time:  Friday February 24 2013 21:56:35  EST Ventricular Rate:  83 PR Interval:  170 QRS Duration: 96 QT Interval:  396 QTC Calculation: 465 R Axis:   -11 Text Interpretation:  Normal sinus rhythm Prolonged QT Abnormal ECG Confirmed by DOCHERTY  MD, MEGAN ([id number]) on 02/24/2013 10:02:03 PM            MDM   1. Seizure   2. Atypical chest pain    9:53 PM 42 y.o. female with a history of seizures who presents with a seizure episode yesterday evening and this evening as well. These episodes were consistent  with her typical seizures and she usually has a few seizures per week. Her husband notes that she had some confusion after the episode this evening. She has had generalized weakness since then as well. She is afebrile and vital signs are unremarkable here. She is alert and oriented x3. She is unable to ambulate for me on exam. Her husband states that sometimes when she is postictal she is unable to ambulate for long periods of time. He states that she's been on Keppra in the past but never followed up with her neurologist after initial eval in Sept 2014. She has been evaluated for MS w/ MRI and LP in the past. The neuro notes at that time doubt MS based on LP results. Will get screening labs, imaging, and CT of head.  The patient also notes that she began having some mild left-sided chest pain in route to the hospital. She notes this is consistent with chest pain she has had many times in the past. Low risk for MACE per HEART score.   11:49 PM: Labs/imaging non-contrib. Husband states it takes pt days sometimes before she can walk again after a seizure. Will provide pain medicine for atypical chest pain/neck pain which is likely msk in nature. Will refill keppra as she has been on this in the past.  I have discussed the diagnosis/risks/treatment options with the family and believe the pt to be eligible for discharge home to follow-up with a new neurologist (at families request). I provided referral. Husband feels comfortable taking pt  home as her sx are c/w sx she has had in the past. We also discussed returning to the ED immediately if new or worsening sx occur. We discussed the sx which are most concerning (e.g., fever, worsening seizures) that necessitate immediate return. Any new prescriptions provided to the patient are listed below.  New Prescriptions   LEVETIRACETAM (KEPPRA) 500 MG TABLET    Take 1 tablet (500 mg total) by mouth 2 (two) times daily.   OXYCODONE-ACETAMINOPHEN (PERCOCET) 5-325 MG PER TABLET    Take 1 tablet by mouth every 6 (six) hours as needed for moderate pain.     Blanchard Kelch, MD 02/25/13 [phone]

## 2013-04-17 ENCOUNTER — Emergency Department (HOSPITAL_COMMUNITY): Payer: Medicaid Other

## 2013-04-17 ENCOUNTER — Observation Stay (HOSPITAL_COMMUNITY)
Admission: EM | Admit: 2013-04-17 | Discharge: 2013-04-20 | Disposition: A | Discharge status: Home / Self Care | Payer: Medicaid Other | Attending: Internal Medicine | Admitting: Internal Medicine

## 2013-04-17 ENCOUNTER — Encounter (HOSPITAL_COMMUNITY): Payer: Self-pay | Admitting: Emergency Medicine

## 2013-04-17 DIAGNOSIS — E1165 Type 2 diabetes mellitus with hyperglycemia: Secondary | ICD-10-CM

## 2013-04-17 DIAGNOSIS — E119 Type 2 diabetes mellitus without complications: Secondary | ICD-10-CM | POA: Diagnosis present

## 2013-04-17 DIAGNOSIS — Z79899 Other long term (current) drug therapy: Secondary | ICD-10-CM | POA: Insufficient documentation

## 2013-04-17 DIAGNOSIS — Z885 Allergy status to narcotic agent status: Secondary | ICD-10-CM | POA: Insufficient documentation

## 2013-04-17 DIAGNOSIS — R1084 Generalized abdominal pain: Secondary | ICD-10-CM | POA: Insufficient documentation

## 2013-04-17 DIAGNOSIS — Z9089 Acquired absence of other organs: Secondary | ICD-10-CM | POA: Insufficient documentation

## 2013-04-17 DIAGNOSIS — J3489 Other specified disorders of nose and nasal sinuses: Secondary | ICD-10-CM | POA: Insufficient documentation

## 2013-04-17 DIAGNOSIS — IMO0001 Reserved for inherently not codable concepts without codable children: Secondary | ICD-10-CM | POA: Insufficient documentation

## 2013-04-17 DIAGNOSIS — R197 Diarrhea, unspecified: Secondary | ICD-10-CM | POA: Insufficient documentation

## 2013-04-17 DIAGNOSIS — R569 Unspecified convulsions: Secondary | ICD-10-CM | POA: Diagnosis present

## 2013-04-17 DIAGNOSIS — F17200 Nicotine dependence, unspecified, uncomplicated: Secondary | ICD-10-CM | POA: Insufficient documentation

## 2013-04-17 DIAGNOSIS — R0789 Other chest pain: Principal | ICD-10-CM | POA: Diagnosis present

## 2013-04-17 DIAGNOSIS — Z880 Allergy status to penicillin: Secondary | ICD-10-CM | POA: Insufficient documentation

## 2013-04-17 DIAGNOSIS — F1110 Opioid abuse, uncomplicated: Secondary | ICD-10-CM | POA: Insufficient documentation

## 2013-04-17 DIAGNOSIS — R05 Cough: Secondary | ICD-10-CM | POA: Insufficient documentation

## 2013-04-17 DIAGNOSIS — Z888 Allergy status to other drugs, medicaments and biological substances status: Secondary | ICD-10-CM | POA: Insufficient documentation

## 2013-04-17 DIAGNOSIS — Z720 Tobacco use: Secondary | ICD-10-CM | POA: Diagnosis present

## 2013-04-17 DIAGNOSIS — R1310 Dysphagia, unspecified: Secondary | ICD-10-CM | POA: Insufficient documentation

## 2013-04-17 DIAGNOSIS — R079 Chest pain, unspecified: Secondary | ICD-10-CM | POA: Diagnosis present

## 2013-04-17 DIAGNOSIS — R112 Nausea with vomiting, unspecified: Secondary | ICD-10-CM | POA: Insufficient documentation

## 2013-04-17 DIAGNOSIS — G35D Multiple sclerosis, unspecified: Secondary | ICD-10-CM | POA: Diagnosis present

## 2013-04-17 DIAGNOSIS — Z9119 Patient's noncompliance with other medical treatment and regimen: Secondary | ICD-10-CM

## 2013-04-17 DIAGNOSIS — R059 Cough, unspecified: Secondary | ICD-10-CM | POA: Insufficient documentation

## 2013-04-17 DIAGNOSIS — G35 Multiple sclerosis: Secondary | ICD-10-CM | POA: Insufficient documentation

## 2013-04-17 DIAGNOSIS — Z7982 Long term (current) use of aspirin: Secondary | ICD-10-CM | POA: Insufficient documentation

## 2013-04-17 DIAGNOSIS — Z8719 Personal history of other diseases of the digestive system: Secondary | ICD-10-CM | POA: Insufficient documentation

## 2013-04-17 DIAGNOSIS — Z91199 Patient's noncompliance with other medical treatment and regimen due to unspecified reason: Secondary | ICD-10-CM

## 2013-04-17 DIAGNOSIS — R0602 Shortness of breath: Secondary | ICD-10-CM | POA: Insufficient documentation

## 2013-04-17 DIAGNOSIS — R42 Dizziness and giddiness: Secondary | ICD-10-CM | POA: Insufficient documentation

## 2013-04-17 DIAGNOSIS — M129 Arthropathy, unspecified: Secondary | ICD-10-CM | POA: Insufficient documentation

## 2013-04-17 DIAGNOSIS — R9431 Abnormal electrocardiogram [ECG] [EKG]: Secondary | ICD-10-CM | POA: Insufficient documentation

## 2013-04-17 DIAGNOSIS — Z9851 Tubal ligation status: Secondary | ICD-10-CM | POA: Insufficient documentation

## 2013-04-17 DIAGNOSIS — G40909 Epilepsy, unspecified, not intractable, without status epilepticus: Secondary | ICD-10-CM | POA: Insufficient documentation

## 2013-04-17 HISTORY — DX: Anemia, unspecified: D64.9

## 2013-04-17 HISTORY — DX: Dorsalgia, unspecified: M54.9

## 2013-04-17 HISTORY — DX: Depression, unspecified: F32.A

## 2013-04-17 HISTORY — DX: Other chronic pain: G89.29

## 2013-04-17 HISTORY — DX: Headache, unspecified: R51.9

## 2013-04-17 HISTORY — DX: Pneumonia, unspecified organism: J18.9

## 2013-04-17 HISTORY — DX: Gastro-esophageal reflux disease without esophagitis: K21.9

## 2013-04-17 HISTORY — DX: Migraine, unspecified, not intractable, without status migrainosus: G43.909

## 2013-04-17 HISTORY — DX: Major depressive disorder, single episode, unspecified: F32.9

## 2013-04-17 HISTORY — DX: Anxiety disorder, unspecified: F41.9

## 2013-04-17 HISTORY — DX: Headache: R51

## 2013-04-17 HISTORY — DX: Epilepsy, unspecified, not intractable, without status epilepticus: G40.909

## 2013-04-17 LAB — POCT I-STAT TROPONIN I: Troponin i, poc: 0.01 ng/mL (ref 0.00–0.08)

## 2013-04-17 LAB — URINALYSIS, ROUTINE W REFLEX MICROSCOPIC
Bilirubin Urine: NEGATIVE
Glucose, UA: 250 mg/dL — AB
KETONES UR: NEGATIVE mg/dL
LEUKOCYTES UA: NEGATIVE
Nitrite: NEGATIVE
Protein, ur: NEGATIVE mg/dL
Specific Gravity, Urine: 1.014 (ref 1.005–1.030)
UROBILINOGEN UA: 0.2 mg/dL (ref 0.0–1.0)
pH: 6.5 (ref 5.0–8.0)

## 2013-04-17 LAB — COMPREHENSIVE METABOLIC PANEL
ALBUMIN: 2.9 g/dL — AB (ref 3.5–5.2)
ALT: 12 U/L (ref 0–35)
AST: 15 U/L (ref 0–37)
Alkaline Phosphatase: 95 U/L (ref 39–117)
BUN: 5 mg/dL — ABNORMAL LOW (ref 6–23)
CO2: 23 mEq/L (ref 19–32)
Calcium: 9.2 mg/dL (ref 8.4–10.5)
Chloride: 96 mEq/L (ref 96–112)
Creatinine, Ser: 0.43 mg/dL — ABNORMAL LOW (ref 0.50–1.10)
GFR calc Af Amer: >90 mL/min (ref >90)
GFR calc non Af Amer: >90 mL/min (ref >90)
Glucose, Bld: 316 mg/dL — ABNORMAL HIGH (ref 70–99)
Potassium: 3.7 mEq/L (ref 3.7–5.3)
SODIUM: 132 meq/L — AB (ref 137–147)
TOTAL PROTEIN: 8.4 g/dL — AB (ref 6.0–8.3)
Total Bilirubin: <0.2 mg/dL — ABNORMAL LOW (ref 0.3–1.2)

## 2013-04-17 LAB — CBC WITH DIFFERENTIAL/PLATELET
BASOS ABS: 0 10*3/uL (ref 0.0–0.1)
BASOS PCT: 0 % (ref 0–1)
EOS ABS: 0.2 10*3/uL (ref 0.0–0.7)
Eosinophils Relative: 2 % (ref 0–5)
HCT: 34.5 % — ABNORMAL LOW (ref 36.0–46.0)
Hemoglobin: 11.4 g/dL — ABNORMAL LOW (ref 12.0–15.0)
Lymphocytes Relative: 28 % (ref 12–46)
Lymphs Abs: 2.6 10*3/uL (ref 0.7–4.0)
MCH: 24.1 pg — AB (ref 26.0–34.0)
MCHC: 33 g/dL (ref 30.0–36.0)
MCV: 72.9 fL — ABNORMAL LOW (ref 78.0–100.0)
Monocytes Absolute: 0.6 10*3/uL (ref 0.1–1.0)
Monocytes Relative: 6 % (ref 3–12)
NEUTROS PCT: 64 % (ref 43–77)
Neutro Abs: 6.1 10*3/uL (ref 1.7–7.7)
PLATELETS: 482 10*3/uL — AB (ref 150–400)
RBC: 4.73 MIL/uL (ref 3.87–5.11)
RDW: 15.6 % — AB (ref 11.5–15.5)
WBC: 9.5 10*3/uL (ref 4.0–10.5)

## 2013-04-17 LAB — RAPID URINE DRUG SCREEN, HOSP PERFORMED
Amphetamines: NOT DETECTED
BENZODIAZEPINES: NOT DETECTED
Barbiturates: NOT DETECTED
Cocaine: NOT DETECTED
Opiates: POSITIVE — AB
TETRAHYDROCANNABINOL: NOT DETECTED

## 2013-04-17 LAB — TROPONIN I
Troponin I: <0.3 ng/mL (ref <0.30)
Troponin I: <0.3 ng/mL (ref <0.30)

## 2013-04-17 LAB — HEMOGLOBIN A1C
Hgb A1c MFr Bld: 8.6 % — ABNORMAL HIGH (ref <5.7)
MEAN PLASMA GLUCOSE: 200 mg/dL — AB (ref <117)

## 2013-04-17 LAB — GLUCOSE, CAPILLARY
Glucose-Capillary: 197 mg/dL — ABNORMAL HIGH (ref 70–99)
Glucose-Capillary: 173 mg/dL — ABNORMAL HIGH (ref 70–99)

## 2013-04-17 LAB — LACTIC ACID, PLASMA: Lactic Acid, Venous: 1.2 mmol/L (ref 0.5–2.2)

## 2013-04-17 LAB — URINE MICROSCOPIC-ADD ON

## 2013-04-17 LAB — MAGNESIUM: Magnesium: 1.8 mg/dL (ref 1.5–2.5)

## 2013-04-17 LAB — LIPASE, BLOOD: Lipase: 44 U/L (ref 11–59)

## 2013-04-17 LAB — D-DIMER, QUANTITATIVE (NOT AT ARMC): D DIMER QUANT: 5.48 ug{FEU}/mL — AB (ref 0.00–0.48)

## 2013-04-17 LAB — PHOSPHORUS: Phosphorus: 3.8 mg/dL (ref 2.3–4.6)

## 2013-04-17 MED ORDER — ACETAMINOPHEN 325 MG PO TABS
650.0000 mg | ORAL_TABLET | Freq: Four times a day (QID) | ORAL | Status: DC | PRN
  Administered 2013-04-17 – 2013-04-20 (×6): 650 mg via ORAL
  Filled 2013-04-17 (×6): qty 2

## 2013-04-17 MED ORDER — NITROGLYCERIN 0.4 MG SL SUBL: 0.4000 mg | SUBLINGUAL_TABLET | SUBLINGUAL | Status: DC | PRN

## 2013-04-17 MED ORDER — SODIUM CHLORIDE 0.9 % IJ SOLN: 3.0000 mL | INTRAMUSCULAR | Status: DC | PRN

## 2013-04-17 MED ORDER — ONDANSETRON HCL 4 MG/2ML IJ SOLN
4.0000 mg | Freq: Once | INTRAMUSCULAR | Status: AC
  Administered 2013-04-17: 4 mg via INTRAVENOUS
  Filled 2013-04-17: qty 2

## 2013-04-17 MED ORDER — MORPHINE SULFATE 4 MG/ML IJ SOLN
4.0000 mg | Freq: Once | INTRAMUSCULAR | Status: AC
  Administered 2013-04-17: 4 mg via INTRAVENOUS
  Filled 2013-04-17: qty 1

## 2013-04-17 MED ORDER — PANTOPRAZOLE SODIUM 40 MG IV SOLR
40.0000 mg | Freq: Once | INTRAVENOUS | Status: AC
  Administered 2013-04-17: 40 mg via INTRAVENOUS
  Filled 2013-04-17: qty 40

## 2013-04-17 MED ORDER — LEVETIRACETAM 500 MG PO TABS
500.0000 mg | ORAL_TABLET | Freq: Two times a day (BID) | ORAL | Status: DC
  Administered 2013-04-17 – 2013-04-20 (×6): 500 mg via ORAL
  Filled 2013-04-17 (×7): qty 1

## 2013-04-17 MED ORDER — ENOXAPARIN SODIUM 40 MG/0.4ML ~~LOC~~ SOLN
40.0000 mg | SUBCUTANEOUS | Status: DC
  Administered 2013-04-17 – 2013-04-20 (×4): 40 mg via SUBCUTANEOUS
  Filled 2013-04-17 (×4): qty 0.4

## 2013-04-17 MED ORDER — INSULIN ASPART 100 UNIT/ML ~~LOC~~ SOLN
0.0000 [IU] | Freq: Three times a day (TID) | SUBCUTANEOUS | Status: DC
  Administered 2013-04-17 – 2013-04-20 (×10): 3 [IU] via SUBCUTANEOUS

## 2013-04-17 MED ORDER — HYDROMORPHONE HCL PF 1 MG/ML IJ SOLN
1.0000 mg | INTRAMUSCULAR | Status: AC
Start: 2013-04-17 — End: 2013-04-17
  Administered 2013-04-17: 1 mg via INTRAVENOUS
  Filled 2013-04-17: qty 1

## 2013-04-17 MED ORDER — ASPIRIN EC 81 MG PO TBEC
81.0000 mg | DELAYED_RELEASE_TABLET | Freq: Every day | ORAL | Status: DC
  Administered 2013-04-18 – 2013-04-20 (×3): 81 mg via ORAL
  Filled 2013-04-17 (×3): qty 1

## 2013-04-17 MED ORDER — GI COCKTAIL ~~LOC~~
30.0000 mL | Freq: Once | ORAL | Status: AC
  Administered 2013-04-17: 30 mL via ORAL
  Filled 2013-04-17: qty 30

## 2013-04-17 MED ORDER — LORAZEPAM 0.5 MG PO TABS
0.5000 mg | ORAL_TABLET | Freq: Three times a day (TID) | ORAL | Status: DC | PRN
Start: 2013-04-17 — End: 2013-04-18
  Administered 2013-04-17 – 2013-04-18 (×3): 0.5 mg via ORAL
  Filled 2013-04-17 (×3): qty 1

## 2013-04-17 MED ORDER — SODIUM CHLORIDE 0.9 % IV BOLUS (SEPSIS)
1000.0000 mL | Freq: Once | INTRAVENOUS | Status: AC
  Administered 2013-04-17: 1000 mL via INTRAVENOUS

## 2013-04-17 MED ORDER — ALBUTEROL SULFATE (2.5 MG/3ML) 0.083% IN NEBU
5.0000 mg | INHALATION_SOLUTION | Freq: Once | RESPIRATORY_TRACT | Status: AC
  Administered 2013-04-17: 5 mg via RESPIRATORY_TRACT
  Filled 2013-04-17: qty 6

## 2013-04-17 MED ORDER — NICOTINE 14 MG/24HR TD PT24
14.0000 mg | MEDICATED_PATCH | Freq: Every day | TRANSDERMAL | Status: DC
  Filled 2013-04-17 (×4): qty 1

## 2013-04-17 MED ORDER — ADULT MULTIVITAMIN W/MINERALS CH
1.0000 | ORAL_TABLET | Freq: Every day | ORAL | Status: DC
  Filled 2013-04-17 (×4): qty 1

## 2013-04-17 MED ORDER — LORAZEPAM 2 MG/ML IJ SOLN
0.2500 mg | Freq: Once | INTRAMUSCULAR | Status: AC
  Administered 2013-04-17: 0.25 mg via INTRAVENOUS
  Filled 2013-04-17: qty 1

## 2013-04-17 MED ORDER — SODIUM CHLORIDE 0.9 % IV SOLN: 250.0000 mL | INTRAVENOUS | Status: DC | PRN

## 2013-04-17 MED ORDER — OXYCODONE-ACETAMINOPHEN 5-325 MG PO TABS
1.0000 | ORAL_TABLET | Freq: Four times a day (QID) | ORAL | Status: DC | PRN
  Administered 2013-04-17 – 2013-04-18 (×3): 1 via ORAL
  Filled 2013-04-17 (×3): qty 1

## 2013-04-17 MED ORDER — SODIUM CHLORIDE 0.9 % IJ SOLN
3.0000 mL | Freq: Two times a day (BID) | INTRAMUSCULAR | Status: DC
  Administered 2013-04-17 – 2013-04-20 (×5): 3 mL via INTRAVENOUS

## 2013-04-17 MED ORDER — NITROGLYCERIN 0.4 MG SL SUBL
0.4000 mg | SUBLINGUAL_TABLET | SUBLINGUAL | Status: AC | PRN
  Administered 2013-04-17 (×3): 0.4 mg via SUBLINGUAL

## 2013-04-17 MED ORDER — HYDROMORPHONE HCL PF 1 MG/ML IJ SOLN
1.0000 mg | INTRAMUSCULAR | Status: AC
  Administered 2013-04-17: 1 mg via INTRAVENOUS
  Filled 2013-04-17: qty 1

## 2013-04-17 MED ORDER — IOHEXOL 350 MG/ML SOLN
100.0000 mL | Freq: Once | INTRAVENOUS | Status: AC | PRN
  Administered 2013-04-17: 100 mL via INTRAVENOUS

## 2013-04-17 NOTE — ED Notes (Signed)
Phlebotomy paged for Lactic acid draw.

## 2013-04-17 NOTE — ED Notes (Signed)
Call placed to internal medicine.  Advised that pt pain is significantly decreased after receiving Ativan 2/10 pain.

## 2013-04-17 NOTE — H&P (Signed)
Date: 04/17/2013               Patient Name:  Andrea Wade MRN: [id number]  DOB: Jan 25, 1971 Age / Sex: 43 y.o., female   PCP: Imagene Riches, NP           Medical Service: Internal Medicine Teaching Service         Attending Physician: Dr. Thayer Headings, MD    First Contact: Dr. Michail Jewels, MD Pager: [phone] (7AM-5PM Mon-Fri)  Second Contact: Dr. Jessee Avers, MD Pager: [phone]       After Hours (After 5p/  First Contact Pager: [phone]  weekends / holidays): Second Contact Pager: [phone]    Most Recent Discharge Date:  02/25/13  Chief Complaint:  Chief Complaint  Patient presents with  . Chest Pain       History of present illness: Pt is a 43 y.o. female with a PMH of uncontrolled DMII,  ?seizures, anxiety, arthritis, crohn's disease (not on treatment), and ?MS (not on treatment) who presents to the ED with left-sided non-exertional chest pain that woke her up from sleep at [place].  She describes the pain as 10/10 initially, sharp (like someone stuck a knife in her), and radiating to the back.  She endorses associated N/V (x3 episodes which were NBNB), diaphoresis, dizziness (without syncope), SOB, chills.  She states the pain is continuous but has changed in character to a dull pain now.  She states the pain decreased to 1/10 with dilaudid.  She denies any heavy lifting or other strenuous activity.  She has a h/o anxiety for which she used to take xanax but according to her the PCP will not prescribe this for her anymore.  There is a FH of MI in her mother at age 87 and her father in his early 32's.  She smokes 1 pack every 2 days but denies any current alcohol or other recreational drug use.  She denies any GERD symptoms.  However, she does report having chronic pains similar to this which she describes as "chest wall pain" but reports this is worse than previous episodes.  She also has anxiety attacks every night which improve with her sitting up in bed.  She is applying for  disability due to [place] and being unable to walk without a walker.  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  According to her, she had an MRI at [facility] which showed she had MS after her boyfriend found her on the bathroom floor.    ED course: In the ED, pt was given ativan, GI cocktail, zofran, dilaudid 40m x2, morphine 423m protonix, NTG x3, 1L bolus NS, albuterol nebs. I-stat troponin and 1st troponin negative.  CXR showed no active cardiopulmonary disease.  CTA revealed no acute findings and no PE. EKG showed no acute changes from prior.  Cardiology was consulted.    Meds: Current Facility-Administered Medications  Medication Dose Route Frequency Provider Last Rate Last Dose  . 0.9 %  sodium chloride infusion  250 mL Intravenous PRN NeOthella BoyerMD      . acetaminophen (TYLENOL) tablet 650 mg  650 mg Oral Q6H PRN JaMichail JewelsMD   650 mg at 04/17/13 1514  . [START ON 04/18/2013] aspirin EC tablet 81 mg  81 mg Oral Daily Neema K Sharda, MD      . enoxaparin (LOVENOX) injection 40 mg  40 mg Subcutaneous  Q24H Othella Boyer, MD   40 mg at 04/17/13 1722  . insulin aspart (novoLOG) injection 0-15 Units  0-15 Units Subcutaneous TID WC Othella Boyer, MD   3 Units at 04/17/13 1722  . levETIRAcetam (KEPPRA) tablet 500 mg  500 mg Oral BID Othella Boyer, MD      . LORazepam (ATIVAN) tablet 0.5 mg  0.5 mg Oral TID PRN Jessee Avers, MD   0.5 mg at 04/17/13 1621  . multivitamin with minerals tablet 1 tablet  1 tablet Oral Daily Michail Jewels, MD      . nicotine (NICODERM CQ - dosed in mg/24 hours) patch 14 mg  14 mg Transdermal Daily Michail Jewels, MD      . nitroGLYCERIN (NITROSTAT) SL tablet 0.4 mg  0.4 mg Sublingual Q5 Min x 3 PRN Neema K Sharda, MD      . sodium chloride 0.9 % injection 3 mL  3 mL Intravenous Q12H Neema K Sharda, MD      . sodium chloride 0.9 % injection 3 mL  3 mL Intravenous PRN Othella Boyer, MD         Prescriptions prior to admission  Medication Sig Dispense Refill  . Acetaminophen (TYLENOL ARTHRITIS PAIN PO) Take 1 tablet by mouth every 8 (eight) hours as needed (for pain).      Marland Kitchen aspirin EC 81 MG tablet Take 81 mg by mouth daily.      Marland Kitchen glipiZIDE (GLUCOTROL) 5 MG tablet Take 5 mg by mouth 2 (two) times daily before a meal.      . levETIRAcetam (KEPPRA) 500 MG tablet Take 1 tablet (500 mg total) by mouth 2 (two) times daily.  60 tablet  0  . [DISCONTINUED] oxyCODONE-acetaminophen (PERCOCET/ROXICET) 5-325 MG per tablet Take 1 tablet by mouth every 6 (six) hours as needed for moderate pain or severe pain.      . metFORMIN (GLUCOPHAGE) 500 MG tablet Take 500 mg by mouth 2 (two) times daily with a meal.         Allergies: Allergies as of 04/17/2013 - Review Complete 04/17/2013  Allergen Reaction Noted  . Codeine Hives, Itching, and Palpitations 09/21/2012  . Peanut-containing drug products Anaphylaxis 09/21/2012  . Penicillins Hives, Itching, and Nausea And Vomiting 09/21/2012    PMH: Past Medical History  Diagnosis Date  . Diabetes type 2, uncontrolled   . Crohn disease   . Multiple sclerosis 07/2012  . Pneumonia     "about 10 times in my lifetime" (04/17/2013)  . Anemia   . GERD (gastroesophageal reflux disease)   . Daily headache   . Migraine     "q day" (04/17/2013)  . Epileptic     "had 1-2/night; started RX; last one was 01/2014"  . Chronic back pain     "all over" (04/17/2013)  . Anxiety   . Depression   . Arthritis     "joints" (04/17/2013)    PSH: Past Surgical History  Procedure Laterality Date  . Cholecystectomy  1994  . Tubal ligation  1993    FH: Family History  Problem Relation Age of Onset  . Diabetes Mother   . Heart disease Father     SH: History  Substance Use Topics  . Smoking status: Current Every Day Smoker -- 0.50 packs/day for 21 years    Types: Cigarettes  . Smokeless tobacco: Never Used  . Alcohol Use: No    Review of  Systems: Pertinent items are noted in HPI.  Physical  Exam: Blood pressure 110/68, pulse 68, temperature 97.9 F (36.6 C), temperature source Oral, resp. rate 16, height 5' 8"  (1.727 m), weight 209 lb (94.802 kg), last menstrual period 04/04/2013, SpO2 100.00%.  Physical Exam Constitutional: Vital signs reviewed.  Patient is a pale, unkempt, middle-age female who appears older than her stated age in NAD and cooperative with exam.  Head: Normocephalic and atraumatic Eyes: PERRL, EOMI, pale conjunctivae, no scleral icterus Throat: endentulous; moist mucous membranes Neck: Supple, Trachea midline Cardiovascular: RRR, no MRG, pulses symmetric and intact bilaterally Pulmonary/Chest: tenderness to palpation of left upper chest/midchest (location of CP), normal respiratory effort, CTAB, no wheezes, rales, or rhonchi Abdominal: Soft. +BS, diffuse tenderness without rebound, guarding, or organomegaly.  Musculoskeletal: No joint deformities, erythema, or stiffness Neurological: A&O x3, cranial nerve II-XII are grossly intact, no focal motor deficit, pt able to lift LE slightly b/l (likely d/t poor effort), pt grip strength poor (likely d/t poor effort) but sensation is intact b/l.   Skin: Warm, dry and intact. No rash, cyanosis, or clubbing.  Psychiatric: Normal mood and affect.   Lab results:  Basic Metabolic Panel:  Recent Labs  04/17/13 0628 04/17/13 1731  NA 132*  --   K 3.7  --   CL 96  --   CO2 23  --   GLUCOSE 316*  --   BUN 5*  --   CREATININE 0.43*  --   CALCIUM 9.2  --   MG  --  1.8  PHOS  --  3.8   Anion Gap: 13  Liver Function Tests:  Recent Labs  04/17/13 0628  AST 15  ALT 12  ALKPHOS 95  BILITOT <0.2*  PROT 8.4*  ALBUMIN 2.9*    Recent Labs  04/17/13 0647  LIPASE 44   No results found for this basename: AMMONIA,  in the last 72 hours  CBC:    Component Value Date/Time   WBC 9.5 04/17/2013 0628   RBC 4.73 04/17/2013 0628   RBC 4.88 09/22/2012 1005    HGB 11.4* 04/17/2013 0628   HCT 34.5* 04/17/2013 0628   PLT 482* 04/17/2013 0628   MCV 72.9* 04/17/2013 0628   MCH 24.1* 04/17/2013 0628   MCHC 33.0 04/17/2013 0628   RDW 15.6* 04/17/2013 0628   LYMPHSABS 2.6 04/17/2013 0628   MONOABS 0.6 04/17/2013 0628   EOSABS 0.2 04/17/2013 0628   BASOSABS 0.0 04/17/2013 0628    Cardiac Enzymes:  Recent Labs  04/17/13 0634  TROPIPOC 0.01   Lab Results  Component Value Date   CKTOTAL 44 09/23/2012   TROPONINI <0.30 04/17/2013   09/23/12 Transthoracic Echocardiogram:  Study Conclusions - Left ventricle: The cavity size was normal. Wall thickness was normal. The estimated ejection fraction was 55%. Although no diagnostic regional wall motion abnormality was identified, this possibility cannot be completely excluded on the basis of this study. Doppler parameters are consistent with abnormal left ventricular relaxation (grade 1 diastolic dysfunction). - Aortic valve: There was no stenosis. - Mitral valve: No significant regurgitation. - Left atrium: The atrium was mildly dilated. - Right ventricle: The cavity size was normal. Systolic function was normal. - Inferior vena cava: The vessel was normal in size; the respirophasic diameter changes were in the normal range (= 50%); findings are consistent with normal central venous pressure. Impressions: - Technically difficult study with poor acoustic windows. Normal LV size and systolic function, EF 69%. Normal RV size and systolic function. No significant valvular abnormalities.  BNP: No results found for  this basename: PROBNP,  in the last 72 hours  D-Dimer:  Recent Labs  04/17/13 0647  DDIMER 5.48*    CBG:  Recent Labs  04/17/13 1713  GLUCAP 197*    Hemoglobin A1C: No results found for this basename: HGBA1C,  in the last 72 hours  Lipid Panel: No results found for this basename: CHOL, HDL, LDLCALC, TRIG, CHOLHDL, LDLDIRECT,  in the last 72 hours  Thyroid Function Tests: No  results found for this basename: TSH, T4TOTAL, FREET4, T3FREE, THYROIDAB,  in the last 72 hours  Anemia Panel: No results found for this basename: VITAMINB12, FOLATE, FERRITIN, TIBC, IRON, RETICCTPCT,  in the last 72 hours  Coagulation: No results found for this basename: LABPROT, INR,  in the last 72 hours  Urine Drug Screen: Drugs of Abuse:     Component Value Date/Time   LABOPIA POSITIVE* 04/17/2013 0946    Alcohol Level: No results found for this basename: ETH,  in the last 72 hours  Urinalysis:   Recent Labs  04/17/13 0946  COLORURINE YELLOW  LABSPEC 1.014  PHURINE 6.5  GLUCOSEU 250*  HGBUR TRACE*  BILIRUBINUR NEGATIVE  KETONESUR NEGATIVE  PROTEINUR NEGATIVE  UROBILINOGEN 0.2  NITRITE NEGATIVE  LEUKOCYTESUR NEGATIVE    Imaging results:  Dg Chest 2 View  04/17/2013   CLINICAL DATA:  Chest pain.  EXAM: CHEST  2 VIEW  COMPARISON:  02/24/2013  FINDINGS: Normal heart size and mediastinal contours. No acute infiltrate or edema. No effusion or pneumothorax. No acute osseous findings. Cholecystectomy changes.  IMPRESSION: No active cardiopulmonary disease.   Electronically Signed   By: Jorje Guild M.D.   On: 04/17/2013 07:30   Ct Angio Chest Pe W/cm &/or Wo Cm  04/17/2013   CLINICAL DATA:  Chest pain for 3 hr sharp pain central chest  EXAM: CT ANGIOGRAPHY CHEST WITH CONTRAST  TECHNIQUE: Multidetector CT imaging of the chest was performed using the standard protocol during bolus administration of intravenous contrast. Multiplanar CT image reconstructions and MIPs were obtained to evaluate the vascular anatomy.  CONTRAST:  169m OMNIPAQUE IOHEXOL 350 MG/ML SOLN  COMPARISON:  DG CHEST 2 VIEW dated 04/17/2013; DG CHEST 2 VIEW dated 02/24/2013  FINDINGS: Lungs are clear. No filling defects in the pulmonary arterial system. Thoracic aorta is not dilated. There are small nonpathologic mediastinal lymph nodes. No pleural effusions. No pneumothorax. Thoracic inlet is normal.  Posterior  laterally in the left T9 vertebral body there is a 2 cm circumscribed partially lytic lesion with thick trabeculation. The features are not specific but suggests that this may represent a benign hemangioma. Scans through the upper abdomen are negative.  Review of the MIP images confirms the above findings.  IMPRESSION: No acute findings.  No pulmonary embolism.   Electronically Signed   By: RSkipper ClicheM.D.   On: 04/17/2013 09:20    EKG:  Date/Time:    Ventricular Rate:    PR Interval:    QRS Duration:   QT Interval:    QTC Calculation:   R Axis:     Text Interpretation:      Antibiotics: Antibiotics Given (last 72 hours)   None      Anti-infectives   None      SIRS/Sepsis/Septic Shock criteria met: No   Consults: Treatment Team:  Rounding Lbcardiology, MD   Assessment & Plan by Problem: Active Problems:   Chest pain  Pt is a 43y.o. female with a PMH of uncontrolled DMII,  ?seizures, anxiety, arthritis, crohn's  disease (not on treatment), and ?MS (not on treatment) who presents to the ED with left-sided non-exertional chest pain that woke her up from sleep at [place].  #Atypical Chest Pain-  Pt has risk factors for ACS including DMII, family hx, smoking.  ACS should be ruled out.  TIMI score: 0. Other diagnoses considered include: anxiety-most likely (pt reports having anxiety attacks nightly), PE (CTA negative for PE; Wells score: 1.5 , pneumothorax (no pleuritic CP), pneumonia (no  rigors, fever, cough, pleuritic CP, or leukocytosis), aortic dissection (pain not described as acute, tearing/ripping, no widening of mediastinum on CXR), pericarditis (no pleuritic CP, diffuse ST elevation on EKG, or friction rub), GI etiology (denies symptoms of dyspepsia, regurgitation).  Cardiology consulted and will do [facility] tomorrow.  Troponin x 1 negative, i-Stat trop negative.  -admit to observation (telemetry) -troponins X2 pending -O2 PRN -morphine, NTG, acetaminophen PRN -ASA  65m daily -lipid panel, HA1c -EKG prn chest pain -CMP, Mg, Phos -smoking/substance abuse cessation counseling  #Uncontrolled/Controlled Diabetes Mellitus II -   Lab Results  Component Value Date   HGBA1C 8.8* 09/22/2012  -check HA1C -d/c home meds of glucotrol 564mbid, metformin 50080mid -SSI-M -ac and hs cbg -consider consult to diabetes education   #Anxiety - Pt reports previously being on xanax for anxiety; chest pain is most likely related to this.  -ativan 0.5mg61md PRN  -[facility]   #?Multiple sclerosis -  Per pt report; [facility]. [facility] and he did not suspect MS based on LP results but was going to review MRI/outside records. His plan was to get EEG and EMG/NCS. -[facility]  #Seizures - Pt denies any episodes of seizure activity since on keppra.  -continue keppra 500mg72m  #?h/o Crohns -  Per pt report; she reports a h/o Crohns 20 years ago and reports her last colonoscopy was 20 yrs. ago.  She has never been on treatment for this and reports diarrhea at times for which she takes imodium.  -f/u with PCP  #Microcytic anemia -  H/H stable; baseline hgb ~11.Pt has no symptoms/signs of bleeding.  Most likely d/t iron-deficiency anemia as pt reports having heavy periods.  Last LMP was 04/03/13-04/07/13.   -monitor for bleeding -check ferritin  #Protein-calorie malnutrition, severe -  -consult nutrition   #Substance abuse -  Pt has a h/o tobacco use. -MVI -nicotine patch 14mg 79my -CSW consult  #Deconditioning - -PT/OT  #FEN-  NS-None Electrolytes-  Hyponatremia -pt appears euvolemic on exam; corrected sodium: 137; check TSH. Diet-Carb modified; NPO after midnight for Myoview  #VTE prophylaxis- lovenox 40mg S78m   #Dispo- Disposition is deferred pending  Myoview tomorrow.     Emergency Contact: Contact Information   Name Relation Home Work Mobile Livingstonicant other   [phone] Forester   [phone] patient does have a current PCP (ReginaImagene Richesnd does need [facility] after discharge.  I would recommend [facility] with [facility] and they may be able to provide her with more services.   Signed: JacquelMichail JewelsY-1, Internal Medicine  [phone] Mon-Fri) 04/17/2013, 6:47 PM

## 2013-04-17 NOTE — Discharge Planning (Addendum)
Andrea Wade, Community Liaison  Spoke to patient about primary care resources in [employer], resource guide was given. Patient did state that she has medicaid in pending status. My contact information given for any future questions or concerns.

## 2013-04-17 NOTE — ED Notes (Signed)
Respiratory paged for breathing treatment 

## 2013-04-17 NOTE — ED Notes (Signed)
Report called to [facility], not available to call back shortly.

## 2013-04-17 NOTE — ED Notes (Signed)
Pt arrives via EMS from home. Pt c/o chest pain x 3 hours which woke her up from sleep. Pt describes at sharp pain in center of chest raidaiting to her back. 9/10.  States SOB and N/V. Pt took 7 - 63m ASA prior to EMS arrival.  Also took 2 NTG with EMS with no change in pain level. Pt c/o trouble swallowing. BP 158/104 HR 100. 100% RA CBG 363. 18 RAC

## 2013-04-17 NOTE — ED Provider Notes (Signed)
CSN: [id number]     Arrival date & time 04/17/13  0615 History   First MD Initiated Contact with Patient 04/17/13 [phone]     Chief Complaint  Patient presents with  . Chest Pain     (Consider location/radiation/quality/duration/timing/severity/associated sxs/prior Treatment) The history is provided by the patient.   Patient with DM, Multiple sclerosis, p/w acute onset sharp central chest pain that woke her from sleep at 3am.  Associated lightheadedness, SOB, nausea, palpitations (heart racing).  Has had cough productive of green sputum for the past several days.  Also develop N/V/D and epigastric pain around 12am tonight. Emesis was yellow, nonbloody. Vomited three times prior to chest pain starting. Has had headache and nasal congestion as well.  Denies leg swelling.  Pt is in bed a lot due to MS, walks with walker.  She does smoke.  Denies exogenous estrogen.  Has had chronic difficulty swallowing x months that is intermittent, has put herself on a soft diet.  This morning took 4 aspirin at 3am, and then 3 aspirin at 5am per instructions from 911 operator.  Was given nitro by EMS which she states did not help.  Family hx CAD - mother had MI at 49 and brother MI at 33.  No personal or family hx blood clots.   Past Medical History  Diagnosis Date  . Diabetes type 2, uncontrolled   . Seizures   . Arthritis   . Crohn disease    Past Surgical History  Procedure Laterality Date  . Cholecystectomy    . Tubal ligation     Family History  Problem Relation Age of Onset  . Diabetes Mother   . Heart disease Father    History  Substance Use Topics  . Smoking status: Current Every Day Smoker -- 0.50 packs/day    Types: Cigarettes  . Smokeless tobacco: Not on file  . Alcohol Use: No   OB History   Grav Para Term Preterm Abortions TAB SAB Ect Mult Living                 Review of Systems  Constitutional: Negative for fever and chills.  HENT: Positive for congestion and trouble  swallowing. Negative for sore throat.   Respiratory: Positive for cough and shortness of breath.   Cardiovascular: Positive for chest pain.  Gastrointestinal: Positive for nausea, vomiting, abdominal pain and diarrhea.  Genitourinary: Negative for dysuria, urgency, frequency, vaginal bleeding and vaginal discharge.  Neurological: Positive for light-headedness.  All other systems reviewed and are negative.      Allergies  Codeine; Peanut-containing drug products; and Penicillins  Home Medications   Current Outpatient Rx  Name  Route  Sig  Dispense  Refill  . ALPRAZolam (XANAX) 0.25 MG tablet   Oral   Take 0.25 mg by mouth at bedtime as needed for sleep or anxiety.         Marland Kitchen aspirin EC 81 MG tablet   Oral   Take 81 mg by mouth daily.         Marland Kitchen glipiZIDE (GLUCOTROL) 5 MG tablet   Oral   Take 5 mg by mouth 2 (two) times daily before a meal.         . ibuprofen (ADVIL,MOTRIN) 200 MG tablet   Oral   Take 400 mg by mouth at bedtime as needed for moderate pain.         Marland Kitchen levETIRAcetam (KEPPRA) 500 MG tablet   Oral   Take 500 mg by mouth  every 12 (twelve) hours.         . levETIRAcetam (KEPPRA) 500 MG tablet   Oral   Take 1 tablet (500 mg total) by mouth 2 (two) times daily.   60 tablet   0   . metFORMIN (GLUCOPHAGE) 500 MG tablet   Oral   Take 500 mg by mouth 2 (two) times daily with a meal.          . naproxen sodium (ANAPROX) 220 MG tablet   Oral   Take 220-440 mg by mouth 2 (two) times daily. Two in the morning and 1 at night         . oxyCODONE-acetaminophen (PERCOCET) 5-325 MG per tablet   Oral   Take 1 tablet by mouth every 6 (six) hours as needed for moderate pain.   20 tablet   0    BP 131/78  Pulse 94  Temp(Src) 98.1 F (36.7 C) (Oral)  Resp 16  Ht 5' 8"  (1.727 m)  Wt 209 lb (94.802 kg)  BMI 31.79 kg/m2  SpO2 99%  LMP 04/04/2013 Physical Exam  Nursing note and vitals reviewed. Constitutional: She appears well-developed and  well-nourished. No distress.  HENT:  Head: Normocephalic and atraumatic.  Neck: Neck supple.  Cardiovascular: Normal rate and regular rhythm.   Pulmonary/Chest: Effort normal and breath sounds normal. No respiratory distress. She has no wheezes. She has no rales. She exhibits tenderness.  Chest tender, does not reproduce pain  Abdominal: Soft. She exhibits no distension. There is generalized tenderness. There is no rebound and no guarding.  Musculoskeletal: She exhibits no edema.  Neurological: She is alert.  Skin: She is not diaphoretic.    ED Course  Procedures (including critical care time) Labs Review Labs Reviewed  CBC WITH DIFFERENTIAL - Abnormal; Notable for the following:    Hemoglobin 11.4 (*)    HCT 34.5 (*)    MCV 72.9 (*)    MCH 24.1 (*)    RDW 15.6 (*)    Platelets 482 (*)    All other components within normal limits  COMPREHENSIVE METABOLIC PANEL - Abnormal; Notable for the following:    Sodium 132 (*)    Glucose, Bld 316 (*)    BUN 5 (*)    Creatinine, Ser 0.43 (*)    Total Protein 8.4 (*)    Albumin 2.9 (*)    Total Bilirubin <0.2 (*)    All other components within normal limits  URINALYSIS, ROUTINE W REFLEX MICROSCOPIC - Abnormal; Notable for the following:    Glucose, UA 250 (*)    Hgb urine dipstick TRACE (*)    All other components within normal limits  D-DIMER, QUANTITATIVE - Abnormal; Notable for the following:    D-Dimer, Quant 5.48 (*)    All other components within normal limits  URINE MICROSCOPIC-ADD ON - Abnormal; Notable for the following:    Squamous Epithelial / LPF FEW (*)    All other components within normal limits  URINE RAPID DRUG SCREEN (HOSP PERFORMED) - Abnormal; Notable for the following:    Opiates POSITIVE (*)    All other components within normal limits  LIPASE, BLOOD  TROPONIN I  LACTIC ACID, PLASMA  TROPONIN I  TROPONIN I  HEMOGLOBIN A1C  HIV ANTIBODY (ROUTINE TESTING)  MAGNESIUM  PHOSPHORUS  VITAMIN D 25 HYDROXY   POCT I-STAT TROPONIN I   Imaging Review Dg Chest 2 View  04/17/2013   CLINICAL DATA:  Chest pain.  EXAM: CHEST  2 VIEW  COMPARISON:  02/24/2013  FINDINGS: Normal heart size and mediastinal contours. No acute infiltrate or edema. No effusion or pneumothorax. No acute osseous findings. Cholecystectomy changes.  IMPRESSION: No active cardiopulmonary disease.   Electronically Signed   By: Jorje Guild M.D.   On: 04/17/2013 07:30   Ct Angio Chest Pe W/cm &/or Wo Cm  04/17/2013   CLINICAL DATA:  Chest pain for 3 hr sharp pain central chest  EXAM: CT ANGIOGRAPHY CHEST WITH CONTRAST  TECHNIQUE: Multidetector CT imaging of the chest was performed using the standard protocol during bolus administration of intravenous contrast. Multiplanar CT image reconstructions and MIPs were obtained to evaluate the vascular anatomy.  CONTRAST:  139m OMNIPAQUE IOHEXOL 350 MG/ML SOLN  COMPARISON:  DG CHEST 2 VIEW dated 04/17/2013; DG CHEST 2 VIEW dated 02/24/2013  FINDINGS: Lungs are clear. No filling defects in the pulmonary arterial system. Thoracic aorta is not dilated. There are small nonpathologic mediastinal lymph nodes. No pleural effusions. No pneumothorax. Thoracic inlet is normal.  Posterior laterally in the left T9 vertebral body there is a 2 cm circumscribed partially lytic lesion with thick trabeculation. The features are not specific but suggests that this may represent a benign hemangioma. Scans through the upper abdomen are negative.  Review of the MIP images confirms the above findings.  IMPRESSION: No acute findings.  No pulmonary embolism.   Electronically Signed   By: RSkipper ClicheM.D.   On: 04/17/2013 09:20    Date: 04/17/2013  Rate: 94  Rhythm: normal sinus rhythm  QRS Axis: normal  Intervals: normal  ST/T Wave abnormalities: flipped T waves inferiorly, T wave flattening laterally  Conduction Disutrbances:left anterior fascicular block  Narrative Interpretation:   Old EKG Reviewed: changes  noted    7:13 AM Discussed patient with Dr NKathrynn Humble  Pt with new EKG changes (t wave inversion).  Plan is for d-dimer r/o then admission for chest pain rule out for ACS. ]  11:18 AM [facility]   MDM   Final diagnoses:  Chest pain  Abnormal EKG    Pt with hx DM, Multple Sclerosis,p/w sharp chest pain and assoc SOB, lightheadedness, nausea that woke her from sleep.   CT angio chest negative for PE.  Pt has bad family hx CAD.   Has EKG changes.  [facility]      [place] PA-C 04/17/13 [phone]

## 2013-04-17 NOTE — ED Notes (Signed)
Internal medicine MD at bedside

## 2013-04-17 NOTE — ED Notes (Signed)
CT paged, pt available for transport.

## 2013-04-17 NOTE — ED Notes (Signed)
Pt returned from CT °

## 2013-04-17 NOTE — ED Notes (Signed)
Cardiology at bedside.

## 2013-04-17 NOTE — Consult Note (Signed)
CARDIOLOGY CONSULT NOTE     Patient ID: Andrea Wade MRN: [id number] DOB/AGE: 06-May-1970 43 y.o.  Admit date: 04/17/2013 Referring Physician:  Linus Salmons Primary Physician: Imagene Riches, NP Primary Cardiologist:  none Reason for Consultation:  Chest Pain  Active Problems:   Chest pain   HPI:   43 yo with no previous history of CAD.  3:00 am awoke with sharp SSCP.  Pain non pleuritic no positional.  Has history of lap choly with retained stone No nausea, vomiting.  Pain intermit ant and now gone in ER. ECG with non specific ST/T wave changes   POC enxymes negative She has a history of MS relating to muscle spasms  Has not had f/u or Rx yet due to pending medicaid card.  When she went to bed last night she felt fine CRF Diabetes and smoking.  CT done in ER negative for PE with normal aortic root size.  Review of CT shows normal coronary origins and no coronary artery calcium.    ROS All other systems reviewed and negative except as noted above  Past Medical History  Diagnosis Date  . Diabetes type 2, uncontrolled   . Seizures   . Arthritis   . Crohn disease     Family History  Problem Relation Age of Onset  . Diabetes Mother   . Heart disease Father     History   Social History  . Marital Status: Single    Spouse Name: N/A    Number of Children: 2  . Years of Education: 11   Occupational History  . N/A    Social History Main Topics  . Smoking status: Current Every Day Smoker -- 0.50 packs/day    Types: Cigarettes  . Smokeless tobacco: Not on file  . Alcohol Use: No  . Drug Use: No  . Sexual Activity: Yes    Partners: Male   Other Topics Concern  . Not on file   Social History Narrative  . No narrative on file    Past Surgical History  Procedure Laterality Date  . Cholecystectomy    . Tubal ligation          Physical Exam: Blood pressure 108/57, pulse 72, temperature 98 F (36.7 C), temperature source Oral, resp. rate 14, height 5' 8"  (1.727 m),  weight 209 lb (94.802 kg), last menstrual period 04/04/2013, SpO2 96.00%.   Affect appropriate Chronically ill white female  Neck supple with no adenopathy JVP normal no bruits no thyromegaly Lungs clear with no wheezing and good diaphragmatic motion Heart:  S1/S2 no murmur, no rub, gallop or click PMI normal Abdomen: benighn, BS positve, no tenderness, no AAA S/P lap choly hyst. no bruit.  No HSM or HJR Distal pulses intact with no bruits No edema Neuro non-focal Skin warm and dry No muscular weakness   Labs:   Lab Results  Component Value Date   WBC 9.5 04/17/2013   HGB 11.4* 04/17/2013   HCT 34.5* 04/17/2013   MCV 72.9* 04/17/2013   PLT 482* 04/17/2013    Recent Labs Lab 04/17/13 0628  NA 132*  K 3.7  CL 96  CO2 23  BUN 5*  CREATININE 0.43*  CALCIUM 9.2  PROT 8.4*  BILITOT <0.2*  ALKPHOS 95  ALT 12  AST 15  GLUCOSE 316*   Lab Results  Component Value Date   CKTOTAL 44 09/23/2012   TROPONINI <0.30 04/17/2013       Radiology: Dg Chest 2 View  04/17/2013   CLINICAL  DATA:  Chest pain.  EXAM: CHEST  2 VIEW  COMPARISON:  02/24/2013  FINDINGS: Normal heart size and mediastinal contours. No acute infiltrate or edema. No effusion or pneumothorax. No acute osseous findings. Cholecystectomy changes.  IMPRESSION: No active cardiopulmonary disease.   Electronically Signed   By: Jorje Guild M.D.   On: 04/17/2013 07:30   Ct Angio Chest Pe W/cm &/or Wo Cm  04/17/2013   CLINICAL DATA:  Chest pain for 3 hr sharp pain central chest  EXAM: CT ANGIOGRAPHY CHEST WITH CONTRAST  TECHNIQUE: Multidetector CT imaging of the chest was performed using the standard protocol during bolus administration of intravenous contrast. Multiplanar CT image reconstructions and MIPs were obtained to evaluate the vascular anatomy.  CONTRAST:  189m OMNIPAQUE IOHEXOL 350 MG/ML SOLN  COMPARISON:  DG CHEST 2 VIEW dated 04/17/2013; DG CHEST 2 VIEW dated 02/24/2013  FINDINGS: Lungs are clear. No filling  defects in the pulmonary arterial system. Thoracic aorta is not dilated. There are small nonpathologic mediastinal lymph nodes. No pleural effusions. No pneumothorax. Thoracic inlet is normal.  Posterior laterally in the left T9 vertebral body there is a 2 cm circumscribed partially lytic lesion with thick trabeculation. The features are not specific but suggests that this may represent a benign hemangioma. Scans through the upper abdomen are negative.  Review of the MIP images confirms the above findings.  IMPRESSION: No acute findings.  No pulmonary embolism.   Electronically Signed   By: RSkipper ClicheM.D.   On: 04/17/2013 09:20    EKG:  SR rate 96 monospecific ST/T wave changes   ASSESSMENT AND PLAN:  Chest Pain:  Diabetic smoker with atypical pain and nonspecific ST/T wave changes.  Enzymes negative.  CT with no coronary calcium  Being admitted by IM  Will order lexiscan myovue for am Unable to walk due to MS  CT negative for PE and normal aortic root MS:  No MRI in Epic CT poor for white matter evaluation.  Would be nice for neuro to see her in hospital to see if further w/u or initiation of Rx can be done DM:  A1c pending discussed low carb diet   Myovue ordered    Signed: PJenkins Rouge2/16/2015, 1:37 PM

## 2013-04-17 NOTE — ED Notes (Signed)
Pt reports having constant spasms in her arms and legs concurrent with her diagnosis of Multiple Sclerosis.  Pt also reports having upper, left and lower back pains that stays constant.  According to pt she has a mass on her lower spine.

## 2013-04-18 ENCOUNTER — Observation Stay (HOSPITAL_COMMUNITY): Payer: Medicaid Other

## 2013-04-18 DIAGNOSIS — E119 Type 2 diabetes mellitus without complications: Secondary | ICD-10-CM

## 2013-04-18 DIAGNOSIS — G35 Multiple sclerosis: Secondary | ICD-10-CM

## 2013-04-18 DIAGNOSIS — R9431 Abnormal electrocardiogram [ECG] [EKG]: Secondary | ICD-10-CM

## 2013-04-18 DIAGNOSIS — R569 Unspecified convulsions: Secondary | ICD-10-CM

## 2013-04-18 DIAGNOSIS — R0789 Other chest pain: Secondary | ICD-10-CM

## 2013-04-18 DIAGNOSIS — R079 Chest pain, unspecified: Secondary | ICD-10-CM

## 2013-04-18 LAB — LIPID PANEL
Cholesterol: 117 mg/dL (ref 0–200)
HDL: 22 mg/dL — AB (ref >39)
LDL CALC: 56 mg/dL (ref 0–99)
Total CHOL/HDL Ratio: 5.3 RATIO
Triglycerides: 193 mg/dL — ABNORMAL HIGH (ref <150)
VLDL: 39 mg/dL (ref 0–40)

## 2013-04-18 LAB — GLUCOSE, CAPILLARY
GLUCOSE-CAPILLARY: 195 mg/dL — AB (ref 70–99)
GLUCOSE-CAPILLARY: 176 mg/dL — AB (ref 70–99)
Glucose-Capillary: 179 mg/dL — ABNORMAL HIGH (ref 70–99)
Glucose-Capillary: 175 mg/dL — ABNORMAL HIGH (ref 70–99)

## 2013-04-18 LAB — BASIC METABOLIC PANEL
BUN: 4 mg/dL — AB (ref 6–23)
CHLORIDE: 96 meq/L (ref 96–112)
CO2: 25 meq/L (ref 19–32)
Calcium: 9.1 mg/dL (ref 8.4–10.5)
Creatinine, Ser: 0.44 mg/dL — ABNORMAL LOW (ref 0.50–1.10)
GFR calc Af Amer: >90 mL/min (ref >90)
GFR calc non Af Amer: >90 mL/min (ref >90)
Glucose, Bld: 199 mg/dL — ABNORMAL HIGH (ref 70–99)
POTASSIUM: 4.1 meq/L (ref 3.7–5.3)
Sodium: 134 mEq/L — ABNORMAL LOW (ref 137–147)

## 2013-04-18 LAB — TSH: TSH: 1.669 u[IU]/mL (ref 0.350–4.500)

## 2013-04-18 LAB — CBC
HEMATOCRIT: 32.4 % — AB (ref 36.0–46.0)
HEMOGLOBIN: 10.6 g/dL — AB (ref 12.0–15.0)
MCH: 23.9 pg — ABNORMAL LOW (ref 26.0–34.0)
MCHC: 32.7 g/dL (ref 30.0–36.0)
MCV: 73.1 fL — AB (ref 78.0–100.0)
Platelets: 397 10*3/uL (ref 150–400)
RBC: 4.43 MIL/uL (ref 3.87–5.11)
RDW: 15.9 % — AB (ref 11.5–15.5)
WBC: 9.5 10*3/uL (ref 4.0–10.5)

## 2013-04-18 LAB — HIV ANTIBODY (ROUTINE TESTING W REFLEX): HIV: REACTIVE — AB

## 2013-04-18 LAB — VITAMIN D 25 HYDROXY (VIT D DEFICIENCY, FRACTURES): Vit D, 25-Hydroxy: 25 ng/mL — ABNORMAL LOW (ref 30–89)

## 2013-04-18 LAB — FERRITIN: Ferritin: 16 ng/mL (ref 10–291)

## 2013-04-18 MED ORDER — REGADENOSON 0.4 MG/5ML IV SOLN
0.4000 mg | Freq: Once | INTRAVENOUS | Status: AC
  Administered 2013-04-18: 0.4 mg via INTRAVENOUS

## 2013-04-18 MED ORDER — TECHNETIUM TC 99M SESTAMIBI GENERIC - CARDIOLITE
30.0000 | Freq: Once | INTRAVENOUS | Status: AC | PRN
  Administered 2013-04-18: 30 via INTRAVENOUS

## 2013-04-18 MED ORDER — LORAZEPAM 0.5 MG PO TABS
0.5000 mg | ORAL_TABLET | Freq: Every evening | ORAL | Status: DC | PRN
  Administered 2013-04-18 – 2013-04-19 (×2): 0.5 mg via ORAL
  Filled 2013-04-18 (×2): qty 1

## 2013-04-18 MED ORDER — VENLAFAXINE HCL ER 37.5 MG PO CP24
37.5000 mg | ORAL_CAPSULE | Freq: Every day | ORAL | Status: DC
  Administered 2013-04-19 – 2013-04-20 (×2): 37.5 mg via ORAL
  Filled 2013-04-18 (×3): qty 1

## 2013-04-18 MED ORDER — ADULT MULTIVITAMIN W/MINERALS CH: 1.0000 | ORAL_TABLET | Freq: Every day | ORAL | Status: DC

## 2013-04-18 MED ORDER — LORAZEPAM 0.5 MG PO TABS: 0.5000 mg | ORAL_TABLET | Freq: Every day | ORAL | Status: DC | PRN

## 2013-04-18 MED ORDER — REGADENOSON 0.4 MG/5ML IV SOLN
INTRAVENOUS | Status: AC
  Administered 2013-04-18: 0.4 mg via INTRAVENOUS
  Filled 2013-04-18: qty 5

## 2013-04-18 MED ORDER — TECHNETIUM TC 99M SESTAMIBI GENERIC - CARDIOLITE
10.0000 | Freq: Once | INTRAVENOUS | Status: AC | PRN
  Administered 2013-04-18: 10 via INTRAVENOUS

## 2013-04-18 MED ORDER — VENLAFAXINE HCL 37.5 MG PO TABS: 37.5000 mg | ORAL_TABLET | Freq: Every day | ORAL | Status: DC

## 2013-04-18 MED ORDER — VENLAFAXINE HCL ER 37.5 MG PO CP24: 37.5000 mg | ORAL_CAPSULE | Freq: Every day | ORAL | Status: DC

## 2013-04-18 NOTE — Progress Notes (Signed)
Inpatient Diabetes Program Recommendations  AACE/ADA: New Consensus Statement on Inpatient Glycemic Control (2013)  Target Ranges:  Prepandial:   less than 140 mg/dL      Peak postprandial:   less than 180 mg/dL (1-2 hours)      Critically ill patients:  140 - 180 mg/dL     Results for Andrea Wade, Andrea Wade (MRN [id number]) as of 04/18/2013 14:43  Ref. Range 04/18/2013 07:14 04/18/2013 12:14  Glucose-Capillary Latest Range: 70-99 mg/dL 179 (H) 175 (H)    **Patient admitted with CP.  Has +history of DM2, MS, and currently uses tobacco at home.  **Per records, patient is supposed to be taking Glipizide 5 mg bid + Metformin 500 mg daily at home.   **Spoke with patient today regarding her DM regimen at home.  Patient told me she hasn't seen her PCP Heide Scales) in over a year.  Patient told me she called her PCP back in September to try to get refills for her Glipizide, Metformin, Xanax, and pain medication, however, her PCP told her she needed to be seen before any refills would be given to her.  As a result, patient ran out of Metformin in September.  Has been taking Glipizide 5 mg tid instead (is only supposed to be taking Glipizide 5 mg bid).  A1c of 8.6% (04/17/13) reflective of sub-optimal control at home.  Patient also told me she has an appeal scheduled for 02/26 at the [place] office to appeal her denial for Medicaid.  Has been on Medicaid in the past but was recently denied renewal coverage of her Medicaid.  **Explained to patient that it is dangerous to titrate her oral DM medications by herself at home.  Explained how both Metformin and Glipizide work and that she could have low blood sugars if she takes too much Glipizide at home.  Stressed the need to consistently see a PCP for medical management and also stressed the importance of good CBG control to prevent both acute and chronic complications.   **MD- Patient will need Rxs for Metformin and Glipizide at d/c.  Since she has  not been taking her Metformin since September, this is likely the reason her A1c is up to 8.6%.   Will follow. Wyn Quaker RN, MSN, CDE Diabetes Coordinator Inpatient Diabetes Program Team Pager: [phone] (8a-10p)

## 2013-04-18 NOTE — ED Provider Notes (Signed)
Medical screening examination/treatment/procedure(s) were conducted as a shared visit with non-physician practitioner(s) and myself.  I personally evaluated the patient during the encounter.   Pt comes in w/ atypical chest pain. Hx of DM, family hx of CAD. Will admit for ACS rule out. HEART score is 2 Pt is chest pain free during my quick eval, with no gross deficits.  Varney Biles, MD 04/18/13 [phone]

## 2013-04-18 NOTE — Progress Notes (Signed)
Lexiscan myoview completed without complications.  Cecilie Kicks, FNP-C [facility] [place]  [phone] or after 5pm and on weekends call [phone] 04/18/2013.now

## 2013-04-18 NOTE — Progress Notes (Signed)
PT Cancellation Note  Patient Details Name: Lexany Belknap MRN: [id number] DOB: [birth year]   Cancelled Treatment:    Reason Eval/Treat Not Completed: Patient at procedure or test/unavailable.  Pt not in room.  PT to check back later.   Thanks,  Barbarann Ehlers. Rochelle Nephew, [place], DPT [phone]   04/18/2013, 10:59 AM

## 2013-04-18 NOTE — Progress Notes (Signed)
Nutrition Brief Note  Patient identified on the Malnutrition Screening Tool (MST) Report. Pt reported that she was unsure if she has lost weight PTA. Per weight hx, weights have been relatively stable with no significant wt loss. Currently with good appetite.  Wt Readings from Last 15 Encounters:  04/18/13 208 lb 12.4 oz (94.7 kg)  01/02/13 209 lb 12.8 oz (95.165 kg)  11/08/12 210 lb (95.255 kg)  09/26/12 214 lb 1.1 oz (97.1 kg)    Body mass index is 31.75 kg/(m^2). Patient meets criteria for Obese ClassI based on current BMI.   Current diet order is Carbohydrate Modified Medium, patient is consuming approximately 85% of meals at this time. Labs and medications reviewed.   No nutrition interventions warranted at this time. If nutrition issues arise, please consult RD.   Inda Coke MS, RD, LDN Inpatient Registered Dietitian Pager: [phone] After-hours pager: [phone]

## 2013-04-18 NOTE — Evaluation (Signed)
Physical Therapy Evaluation Patient Details Name: Andrea Wade MRN: [id number] DOB: April 22, 1970 Today's Date: 04/18/2013 Time: 8250-5397 PT Time Calculation (min): 25 min  PT Assessment / Plan / Recommendation History of Present Illness  43 y.o. female admitted to [facility] on 04/17/13 with PMH of uncontrolled DMII,  ?seizures, anxiety, arthritis, crohn's disease (not on treatment), and ?MS (not on treatment) who presents to the ED with left-sided non-exertional chest pain.  Dx with atypical chest pain and uncontrolled DM.    Clinical Impression  Pt with major inconsistencies between MMT preformed supine in bed and functional assessment.  Functional observations and abilities were much better than when I tested her strength.  Pt will follow acutely for deficits present during our exam.  Pt politely declined HHPT as she has had it in the past and "there was not much they did for me".   PT to follow acutely for deficits listed below.       PT Assessment  Patient needs continued PT services    Follow Up Recommendations  Home health PT;Supervision for mobility/OOB (pt is politely declining. )    Does the patient have the potential to tolerate intense rehabilitation     NA  Barriers to Discharge   None      Equipment Recommendations  None recommended by PT    Recommendations for Other Services   None  Frequency Min 3X/week    Precautions / Restrictions Precautions Precautions: Fall Precaution Comments: due to generalized weakness   Pertinent Vitals/Pain See vitals flow sheet.       Mobility  Bed Mobility Overal bed mobility: Needs Assistance Bed Mobility: Supine to Sit Supine to sit: Min assist General bed mobility comments: Min assist to support trunk to get to sitting.  Assist also needed to help progress bil legs over the side of the bed.  Pt not opulling with her arms due to pain, but was able with good trunk strength do almost a full sit up to get to the side of the bed.    Transfers Overall transfer level: Needs assistance Equipment used: Rolling walker (2 wheeled) Transfers: Sit to/from Stand Sit to Stand: Min assist General transfer comment: min assist to support trunk during transitions.         PT Diagnosis: Difficulty walking;Abnormality of gait;Generalized weakness;Acute pain  PT Problem List: Decreased strength;Decreased activity tolerance;Decreased balance;Decreased mobility;Decreased knowledge of use of DME;Impaired sensation;Pain PT Treatment Interventions: DME instruction;Gait training;Stair training;Functional mobility training;Therapeutic activities;Therapeutic exercise;Balance training;Cognitive remediation;Patient/family education;Modalities     PT Goals(Current goals can be found in the care plan section) Acute Rehab PT Goals Patient Stated Goal: to go home with her boyfriend PT Goal Formulation: With patient Time For Goal Achievement: 05/02/13 Potential to Achieve Goals: Good  Visit Information  Last PT Received On: 04/18/13 Assistance Needed: +1 History of Present Illness: 43 y.o. female admitted to [facility] on 04/17/13 with PMH of uncontrolled DMII,  ?seizures, anxiety, arthritis, crohn's disease (not on treatment), and ?MS (not on treatment) who presents to the ED with left-sided non-exertional chest pain.  Dx with atypical chest pain and uncontrolled DM.         Prior [place] expects to be discharged to:: Private residence Living Arrangements: Spouse/significant other (boyfriend) Available Help at Discharge: Family;Available PRN/intermittently (boyfriend drives trucks for a living) Type of Home: House Home Access: Ramped entrance Home Layout: One level Home Equipment: [place] - 2 wheels;Wheelchair - manual;Shower seat;Bedside commode Prior Function Level of Independence: Needs assistance  Gait / Transfers Assistance Needed: assist needed for bed mobility, transfers, gait ADL's /  Homemaking Assistance Needed: needs help getting dressed, getting bathed Communication / Swallowing Assistance Needed: NA Comments: Pt has toilet chair next to the bed to use while boyfriend is away at work.   Communication Communication: No difficulties Dominant Hand: Right    Cognition  Cognition Arousal/Alertness: Awake/alert Behavior During Therapy: WFL for tasks assessed/performed Overall Cognitive Status: Within Functional Limits for tasks assessed    Extremity/Trunk Assessment Upper Extremity Assessment Upper Extremity Assessment: Defer to OT evaluation Lower Extremity Assessment Lower Extremity Assessment: RLE deficits/detail;LLE deficits/detail RLE Deficits / Details: both right and left leg 2 to 2+/5 strength throughout per MMT supine in the bed, however, her functional ability was much better than her demonstrated strength testing,  When asked to lift her legs during strength testing pt did so with a significant amount of effort and pain, but when putting her socks on to get to EOB, pt was able with less effort and no signs of pain to lift both legs.  Pt should have had signficiant difficulty in standing with either bil legs buckling or knees hyperextended and relying on bed for support.  Instead pt was able to stand on knees with the support of the walker with no signs of buckling or hyperextension, instead she presented like her legs hurt.   LLE Deficits / Details: both right and left leg 2 to 2+/5 strength throughout per MMT supine in the bed, however, her functional ability was much better than her demonstrated strength testing,  When asked to lift her legs during strength testing pt did so with a significant amount of effort and pain, but when putting her socks on to get to EOB, pt was able with less effort and no signs of pain to lift both legs.  Pt should have had signficiant difficulty in standing with either bil legs buckling or knees hyperextended and relying on bed for  support.  Instead pt was able to stand on knees with the support of the walker with no signs of buckling or hyperextension, instead she presented like her legs hurt.   Cervical / Trunk Assessment Cervical / Trunk Assessment: Other exceptions Cervical / Trunk Exceptions: pt reports that she has a bulging disc in her neck.     Balance Balance Overall balance assessment: Needs assistance Sitting-balance support: Feet supported;No upper extremity supported Sitting balance-Leahy Scale: Good Standing balance support: Bilateral upper extremity supported Standing balance-Leahy Scale: Poor  End of Session PT - End of Session Equipment Utilized During Treatment: Gait belt Activity Tolerance: Patient limited by fatigue;Patient limited by pain Patient left: in bed;with call bell/phone within reach  GP Functional Assessment Tool Used: assist level Functional Limitation: Mobility: Walking and moving around Mobility: Walking and Moving Around Current Status ([id number]): At least 20 percent but less than 40 percent impaired, limited or restricted Mobility: Walking and Moving Around Goal Status [phone]): At least 1 percent but less than 20 percent impaired, limited or restricted   Nevia Henkin B. [facility] [place], [place], DPT [phone]   04/18/2013, 4:37 PM

## 2013-04-18 NOTE — H&P (Signed)
I agree with resident findings on the history and physical and plan as described.

## 2013-04-18 NOTE — Progress Notes (Signed)
Subjective: Neck pain with radiation to arms.  Dull ache in chest.  Objective: Vital signs in last 24 hours: Temp:  [97.7 F (36.5 C)-99.1 F (37.3 C)] 99.1 F (37.3 C) (02/17 0512) Pulse Rate:  [61-88] 85 (02/17 0512) Resp:  [13-22] 16 (02/17 0512) BP: (103-123)/(57-78) 116/68 mmHg (02/17 0512) SpO2:  [93 %-100 %] 94 % (02/17 0512) Weight:  [208 lb 12.4 oz (94.7 kg)] 208 lb 12.4 oz (94.7 kg) (02/17 0512) Last BM Date: 04/17/13  Intake/Output from previous day: 02/16 0701 - 02/17 0700 In: 263 [P.O.:260; I.V.:3] Out: 1700 [Urine:1700] Intake/Output this shift:    Medications Current Facility-Administered Medications  Medication Dose Route Frequency Provider Last Rate Last Dose  . 0.9 %  sodium chloride infusion  250 mL Intravenous PRN Othella Boyer, MD      . acetaminophen (TYLENOL) tablet 650 mg  650 mg Oral Q6H PRN Michail Jewels, MD   650 mg at 04/17/13 1514  . aspirin EC tablet 81 mg  81 mg Oral Daily Neema K Sharda, MD      . enoxaparin (LOVENOX) injection 40 mg  40 mg Subcutaneous Q24H Neema Bobbie Stack, MD   40 mg at 04/17/13 1722  . insulin aspart (novoLOG) injection 0-15 Units  0-15 Units Subcutaneous TID WC Othella Boyer, MD   3 Units at 04/18/13 0727  . levETIRAcetam (KEPPRA) tablet 500 mg  500 mg Oral BID Othella Boyer, MD   500 mg at 04/17/13 2111  . LORazepam (ATIVAN) tablet 0.5 mg  0.5 mg Oral TID PRN Jessee Avers, MD   0.5 mg at 04/18/13 0704  . multivitamin with minerals tablet 1 tablet  1 tablet Oral Daily Michail Jewels, MD      . nicotine (NICODERM CQ - dosed in mg/24 hours) patch 14 mg  14 mg Transdermal Daily Michail Jewels, MD      . nitroGLYCERIN (NITROSTAT) SL tablet 0.4 mg  0.4 mg Sublingual Q5 Min x 3 PRN Neema Bobbie Stack, MD      . oxyCODONE-acetaminophen (PERCOCET/ROXICET) 5-325 MG per tablet 1 tablet  1 tablet Oral Q6H PRN Juluis Mire, MD   1 tablet at 04/18/13 0524  . sodium chloride 0.9 % injection 3 mL  3 mL Intravenous Q12H Neema Bobbie Stack, MD   3 mL at 04/17/13 2200  . sodium chloride 0.9 % injection 3 mL  3 mL Intravenous PRN Othella Boyer, MD        PE: General appearance: alert, cooperative and appears uncomfortable. Lungs: clear to auscultation bilaterally Heart: regular rate and rhythm, S1, S2 normal, no murmur, click, rub or gallop Extremities: No LEE Pulses: 2+ and symmetric Skin: Warm and dry Neurologic: Grossly normal  Lab Results:   Recent Labs  04/17/13 0628 04/18/13 0750  WBC 9.5 9.5  HGB 11.4* 10.6*  HCT 34.5* 32.4*  PLT 482* 397   BMET  Recent Labs  04/17/13 0628 04/18/13 0750  NA 132* 134*  K 3.7 4.1  CL 96 96  CO2 23 25  GLUCOSE 316* 199*  BUN 5* 4*  CREATININE 0.43* 0.44*  CALCIUM 9.2 9.1   PT/INR No results found for this basename: LABPROT, INR,  in the last 72 hours Cholesterol  Recent Labs  04/18/13 0750  CHOL 117   Lipid Panel     Component Value Date/Time   CHOL 117 04/18/2013 0750   TRIG 193* 04/18/2013 0750   HDL 22* 04/18/2013 0750   CHOLHDL 5.3 04/18/2013  0750   VLDL 39 04/18/2013 0750   LDLCALC 56 04/18/2013 0750    Cardiac Panel (last 3 results)  Recent Labs  04/17/13 0647 04/17/13 1731 04/17/13 2157  TROPONINI <0.30 <0.30 <0.30     Assessment/Plan  Principal Problem:   Chest pain Active Problems:   Atypical chest pain   Multiple sclerosis   Tobacco abuse   DM2 (diabetes mellitus, type 2)   Seizure   Dyslipidemia  Plan: 43 yo with no previous history of CAD. CRF, Diabetes and smoking.  3:00 am awoke with sharp SSCP. Pain non pleuritic no positional. Has history of lap choly with retained stone   Ruled out for MI.  For lexiscan today.  ASA   Bp and HR controlled.    Uncontrolled DM.  A1C 8.6.  insilin   LOS: 1 day    HAGER, BRYAN 04/18/2013 8:49 AM  I have seen and examined the patient along with Tarri Fuller, PA.  I have reviewed the chart, notes and new data.  I agree with PA's note.  Chest discomfort is atypical and  probably related to her cervical/thoracic spine problems  PLAN: Low risk nuclear study with a small fixed defect that is likely artifact. The left coronary system is visualized reasonably well on the (non-dedicated) chest CT angio and appears widely patent and free of calcium. Nof further coronary evaluation is planned at this time.  Sanda Klein, MD, [place] [phone] 04/18/2013, 3:21 PM

## 2013-04-18 NOTE — Discharge Instructions (Signed)
Please keep your follow-up appointments.     Your current medical regimen is effective;  continue present plan and all medications. Please be sure to bring all of your medications with you to every follow-up appointment.  If you believe that you are suffering from a life threatening condition or one that may result in the loss of limb or function, then you should call 911 or proceed to the nearest Emergency Department.

## 2013-04-18 NOTE — Discharge Summary (Signed)
Name: Andrea Wade MRN: [id number] DOB: September 06, 1970 43 y.o. PCP: Imagene Riches, NP . Date of Admission: 04/17/2013  6:15 AM Date of Discharge: 04/23/2013 Attending Physician: Dr. Scharlene Gloss, MD  Discharge Diagnosis: Principal Problem:   Chest pain Active Problems:   Atypical chest pain   Multiple sclerosis   Tobacco abuse   DM2 (diabetes mellitus, type 2)   Seizure  Discharge Medications:   Medication List    STOP taking these medications       oxyCODONE-acetaminophen 5-325 MG per tablet  Commonly known as:  PERCOCET/ROXICET      TAKE these medications       aspirin EC 81 MG tablet  Take 81 mg by mouth daily.     glipiZIDE 5 MG tablet  Commonly known as:  GLUCOTROL  Take 5 mg by mouth 2 (two) times daily before a meal.     levETIRAcetam 500 MG tablet  Commonly known as:  KEPPRA  Take 1 tablet (500 mg total) by mouth 2 (two) times daily.     loratadine 10 MG tablet  Commonly known as:  CLARITIN  Take 1 tablet (10 mg total) by mouth daily.     metFORMIN 500 MG tablet  Commonly known as:  GLUCOPHAGE  Take 500 mg by mouth 2 (two) times daily with a meal.     multivitamin with minerals Tabs tablet  Take 1 tablet by mouth daily.     TYLENOL ARTHRITIS PAIN PO  Take 1 tablet by mouth every 8 (eight) hours as needed (for pain).     venlafaxine XR 37.5 MG 24 hr capsule  Commonly known as:  EFFEXOR-XR  Take 1 capsule (37.5 mg total) by mouth daily with breakfast.        Disposition and follow-up:   AndreaAndrea Wade was discharged from [facility] in Stable condition.  Please address the following problems:  1. Patient had HIV Ab reactive, but confirmatory test is pending which needs to be addressed;  we did not alert pt to her HIV reactive test but obviously will need to be told if her confirmatory test comes back positive; viral load undetectable, CD4 count normal.  2. Diabetes control and compliance with medications 3. Patient is  without insurance and will need medication assistance 4. ?Multiple sclerosis; ?fibromyalgia? Pt has significant disability and reportedly lies in bed all day.  Address further work-up on chronic pain/disability.  5. Chronic pain/disability 6. Anxiety/depression compliance with meds   Labs / imaging needed at time of follow-up: BMP   Pending labs/ test needing follow-up: HIV confirmatory test, HLA B*[id number]  Follow-up Appointments: Follow-up Information   Follow up with [facility] On 05/02/2013. (@ 2:30 pm- for PCP f/u and pt please call Asencion Partridge at [phone] for financial counseling for orange card assistance (medication assistance))    Contact information:   [place] [id number] [phone]      Discharge Instructions: Discharge Orders   Future Appointments Provider Department Dept Phone   05/02/2013 2:30 PM [facility] Covering Provider [place] [phone]   Future Orders Complete By Expires   Call MD for:  persistant dizziness or light-headedness  As directed    Call MD for:  temperature >100.4  As directed    Diet - low sodium heart healthy  As directed    Discharge instructions  As directed    Comments:     -Please establish yourself with  the [facility], you would benefit from further work up regarding your hand pain/weakness.  To note, we believe the chest pain that you were having was not related to your heart.   Increase activity slowly  As directed       Consultations:  None  Procedures Performed:  Dg Chest 2 View  04/17/2013   CLINICAL DATA:  Chest pain.  EXAM: CHEST  2 VIEW  COMPARISON:  02/24/2013  FINDINGS: Normal heart size and mediastinal contours. No acute infiltrate or edema. No effusion or pneumothorax. No acute osseous findings. Cholecystectomy changes.  IMPRESSION: No active cardiopulmonary disease.   Electronically Signed   By: Jorje Guild M.D.   On:  04/17/2013 07:30   Ct Angio Chest Pe W/cm &/or Wo Cm  04/17/2013   CLINICAL DATA:  Chest pain for 3 hr sharp pain central chest  EXAM: CT ANGIOGRAPHY CHEST WITH CONTRAST  TECHNIQUE: Multidetector CT imaging of the chest was performed using the standard protocol during bolus administration of intravenous contrast. Multiplanar CT image reconstructions and MIPs were obtained to evaluate the vascular anatomy.  CONTRAST:  112m OMNIPAQUE IOHEXOL 350 MG/ML SOLN  COMPARISON:  DG CHEST 2 VIEW dated 04/17/2013; DG CHEST 2 VIEW dated 02/24/2013  FINDINGS: Lungs are clear. No filling defects in the pulmonary arterial system. Thoracic aorta is not dilated. There are small nonpathologic mediastinal lymph nodes. No pleural effusions. No pneumothorax. Thoracic inlet is normal.  Posterior laterally in the left T9 vertebral body there is a 2 cm circumscribed partially lytic lesion with thick trabeculation. The features are not specific but suggests that this may represent a benign hemangioma. Scans through the upper abdomen are negative.  Review of the MIP images confirms the above findings.  IMPRESSION: No acute findings.  No pulmonary embolism.   Electronically Signed   By: RSkipper ClicheM.D.   On: 04/17/2013 09:20    2D Echo: N/A  Cardiac Cath: N/A  Admission HPI: Pt is a 43y.o. female with a PMH of uncontrolled DMII, ?seizures, anxiety, arthritis, crohn's disease (not on treatment), and ?MS (not on treatment) who presents to the ED with left-sided non-exertional chest pain that [place] She describes the pain as 10/10 initially, sharp (like someone stuck a knife in her), and radiating to the back. She endorses associated N/V (x3 episodes which were NBNB), diaphoresis, dizziness (without syncope), SOB, chills. She states the pain is continuous but has changed in character to a dull pain now. She states the pain decreased to 1/10 with dilaudid. She denies any heavy lifting or other strenuous  activity. She has a h/o anxiety for which she used to take xanax but according to her the PCP will not prescribe this for her anymore. There is a FH of MI in her mother at age 5758and her father in his early 553's She smokes 1 pack every 2 days but denies any current alcohol or other recreational drug use. She denies any GERD symptoms. However, she does report having chronic pains similar to this which she describes as "chest wall pain" but reports this is worse than previous episodes. She also has anxiety attacks every night which improve with her sitting up in bed. She [place] for [place] without a walker. 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 According to her, she had  an MRI at [facility] which showed she had MS after her boyfriend found her on the bathroom floor.  ED course: In the ED, pt was given ativan, GI cocktail, zofran, dilaudid 44m x2, morphine 414m protonix, NTG x3, 1L bolus NS, albuterol nebs. I-stat troponin and 1st troponin negative. CXR showed no active cardiopulmonary disease. CTA revealed no acute findings and no PE. EKG showed no acute changes from prior. Cardiology was consulted.   Hospital Course by problem list: Principal Problem:   Chest pain Active Problems:   Atypical chest pain   Multiple sclerosis   Tobacco abuse   DM2 (diabetes mellitus, type 2)   Seizure   #Atypical Chest Pain- Pt has risk factors for ACS including DMII, family hx, smoking. ACS should be ruled out. TIMI score: 0. Other diagnoses considered include: anxiety-most likely (pt reports having anxiety attacks nightly), PE (CTA negative for PE; Wells score: 1.5 , pneumothorax (no pleuritic CP), pneumonia (no rigors, fever, cough, pleuritic CP, or leukocytosis), aortic dissection (pain not described as acute, tearing/ripping, no widening of mediastinum on CXR), pericarditis (no pleuritic CP, diffuse ST  elevation on EKG, or friction rub), GI etiology (denies symptoms of dyspepsia, regurgitation). [facility]iology consulted and [facility]. Troponin x 3 negative, i-Stat trop negative.  Myoview was negative and pt cleared by cardiology.  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     #Uncontrolled/Controlled Diabetes Mellitus II -  Lab Results   Component  Value  Date    HGBA1C  8.8*  09/22/2012    -d/c home meds of glucotrol 33m31mid, metformin 500m4md  -ac and hs cbg  -diabetes educator states pt had been out of metformin/glucotrol since September due to cost   #Anxiety -  Pt reports previously being on xanax for anxiety; chest pain is most likely related to this.  She was given ativan 0.33mg 81mbedtime PRN. [facility].  She was discharged on effexor-XR 37.33mg d18my for chronic pain and anxiety.   #?Multiple sclerosis -  Per pt report; [facility]. [facility] and he did not suspect MS based on LP results but was going [facility] MRI[facility] and EM[facility]/NCS.   #Seizures - Pt denies any episodes of seizure activity since on keppra.  -continued keppra 500mg b56m #?h/o Crohns -  Per pt report; she reports a h/o Crohns 20 years ago and reports her last colonoscopy was 20 yrs. ago. She has never been on treatment for this and reports diarrhea at times for which she takes imodium.  -f/u with PCP   #Microcytic anemia -  H/H stable; baseline hgb ~11.Pt has no symptoms/signs of bleeding. Most likely d/t iron-deficiency anemia as pt reports having heavy periods. Last LMP was 04/03/13-04/07/13. Ferritin 16.  -monitored for bleeding   #Protein-calorie malnutrition, severe - Nutrition consulted and has no further recommendations.   #Substance abuse -  Pt has a h/o tobacco use.    -MVI  -nicotine patch 14mg da46m -CSW consult   #Deconditioning - PT/OT consulted and recommended inpatient rehab but rehab physician felt patient should be further worked up for further etiology of her chronic pain and disability.    #VTE prophylaxis- lovenox 40mg SQ 69mDischarge Vitals:   BP 138/81  Pulse 78  Temp(Src) 97.2  F (36.2 C) (Oral)  Resp 18  Ht 5' 8"  (1.727 m)  Wt 201 lb 7.6 oz (91.389 kg)  BMI 30.64 kg/m2  SpO2 96%  LMP 04/04/2013  Discharge Labs:  No results found for this or any previous visit (from the past 24 hour(s)).  Signed: Michail Jewels, MD [phone] 04/23/2013, 9:59 PM   Time Spent on Discharge: 49mnutes Services Ordered on Discharge: None Equipment Ordered on Discharge: None

## 2013-04-18 NOTE — Care Management Note (Addendum)
    Page 1 of 2   04/20/2013     4:44:38 PM   CARE MANAGEMENT NOTE 04/20/2013  Patient:  Andrea Wade, Andrea Wade   Account Number:  [id number]  Date Initiated:  04/18/2013  Documentation initiated by:  GRAVES-BIGELOW,Deira Shimer  Subjective/Objective Assessment:   Pt admitted for CP. Post myoview. Has family support.     Action/Plan:   CM did speak to pt in reference to medications. Pt owes her PCP 300.00. Until pt can pay- MD is not willing to see or write Rx's for pt. Medicaid pending- pt is in an appeal process as of now.  Pt is not f/u with any PCP as of now.   Anticipated DC Date:  04/19/2013   Anticipated DC Plan:  [place]  CM consult      Choice offered to / List presented to:             Status of service:  Completed, signed off Medicare Important Message given?   (If response is "NO", the following Medicare IM given date fields will be blank) Date Medicare IM given:   Date Additional Medicare IM given:    Discharge Disposition:  HOME/SELF CARE  Per UR Regulation:  Reviewed for med. necessity/level of care/duration of stay  If discussed at [place] of Stay Meetings, dates discussed:    Comments:  04-20-13 1643 Jacqlyn Krauss, RN,BSN [phone] CM will not be able to set pt up with HHPT/OT services due to pt does not have a qualifying diagnosis for services. Pt is a self pay and [place] agency will treat this pt as a medicaid pt. No services at this time.    04-20-13 [place], RN,BSN [phone] Follow up with [facility] On 05/02/2013. (@ 2:30 pm- for PCP f/u and pt please call Asencion Partridge at [phone] for financial counseling for orange card assistance (medication assistance). CIR to look at pt today to see if pt is a possible candidate for CIR. Will continue to f/u.   04-18-13 1453 Jacqlyn Krauss, RN,BSN [phone] CM did speak to pt in reference to medications and the cost of meds.  Pt can get diabetic meds from [facility] if she has [place] card. [facility] has $4.00 med list.

## 2013-04-18 NOTE — Progress Notes (Addendum)
Subjective:   Pt reports her CP is better this AM.  She c/o bilateral leg pain that she describes as "crampy."    Objective:   Vital signs in last 24 hours: Filed Vitals:   04/18/13 1029 04/18/13 1031 04/18/13 1033 04/18/13 1441  BP: 133/71 125/61 134/66 113/67  Pulse:    91  Temp:    99.1 F (37.3 C)  TempSrc:    Oral  Resp:    18  Height:      Weight:      SpO2:    96%   Weight change: -3.6 oz (-0.102 kg)  Intake/Output Summary (Last 24 hours) at 04/18/13 1505 Last data filed at 04/18/13 1300  Gross per 24 hour  Intake    503 ml  Output   2700 ml  Net  -2197 ml    Physical Exam: Constitutional: Vital signs reviewed. Patient is a pale, unkempt, middle-age female who appears older than her stated age in NAD and cooperative with exam.  Head: Normocephalic and atraumatic  Eyes: PERRL, EOMI, pale conjunctivae, no scleral icterus  Throat: endentulous; moist mucous membranes  Neck: Supple, Trachea midline  Cardiovascular: RRR, no MRG, pulses symmetric and intact bilaterally  Pulmonary/Chest: tenderness to palpation of left upper chest/midchest (location of CP), normal respiratory effort, CTAB, no wheezes, rales, or rhonchi  Abdominal: Soft. +BS, diffuse tenderness without rebound, guarding, or organomegaly.  Musculoskeletal: No joint deformities, erythema, or stiffness Neurological: A&O x3, cranial nerve II-XII are grossly intact, no focal motor deficit, pt able to lift LE slightly b/l (likely d/t poor effort), pt grip strength poor (likely d/t poor effort) but sensation is intact b/l.  Skin: Warm, dry and intact. No rash, cyanosis, or clubbing.  Psychiatric: Normal mood and affect.    Lab Results:  BMP:  Recent Labs Lab 04/17/13 0628 04/17/13 1731 04/18/13 0750  NA 132*  --  134*  K 3.7  --  4.1  CL 96  --  96  CO2 23  --  25  GLUCOSE 316*  --  199*  BUN 5*  --  4*  CREATININE 0.43*  --  0.44*  CALCIUM 9.2  --  9.1  MG  --  1.8  --   PHOS  --  3.8  --       CBC:  Recent Labs Lab 04/17/13 0628 04/18/13 0750  WBC 9.5 9.5  NEUTROABS 6.1  --   HGB 11.4* 10.6*  HCT 34.5* 32.4*  MCV 72.9* 73.1*  PLT 482* 397    Coagulation: No results found for this basename: LABPROT, INR,  in the last 168 hours  CBG:            Recent Labs Lab 04/17/13 1713 04/17/13 2213 04/18/13 0714 04/18/13 1214  GLUCAP 197* 173* 179* 175*           HA1C:       Recent Labs Lab 04/17/13 1731  HGBA1C 8.6*    Lipid Panel:  Recent Labs Lab 04/18/13 0750  CHOL 117  HDL 22*  LDLCALC 56  TRIG 193*  CHOLHDL 5.3    LFTs:  Recent Labs Lab 04/17/13 0628  AST 15  ALT 12  ALKPHOS 95  BILITOT <0.2*  PROT 8.4*  ALBUMIN 2.9*    Pancreatic Enzymes:  Recent Labs Lab 04/17/13 0647  LIPASE 44    Ammonia: No results found for this basename: AMMONIA,  in the last 168 hours  Cardiac Enzymes:  Recent Labs Lab 04/17/13  4818 04/17/13 1731 04/17/13 2157  TROPONINI <0.30 <0.30 <0.30   Lab Results  Component Value Date   CKTOTAL 44 09/23/2012   TROPONINI <0.30 04/17/2013    EKG:  Date/Time:    Ventricular Rate:    PR Interval:    QRS Duration:   QT Interval:    QTC Calculation:   R Axis:     Text Interpretation:    BNP: No results found for this basename: PROBNP,  in the last 168 hours  D-Dimer:  Recent Labs Lab 04/17/13 0647  DDIMER 5.48*    Urinalysis:  Recent Labs Lab 04/17/13 0946  COLORURINE YELLOW  LABSPEC 1.014  PHURINE 6.5  GLUCOSEU 250*  HGBUR TRACE*  BILIRUBINUR NEGATIVE  KETONESUR NEGATIVE  PROTEINUR NEGATIVE  UROBILINOGEN 0.2  NITRITE NEGATIVE  LEUKOCYTESUR NEGATIVE    Micro Results: No results found for this or any previous visit (from the past 240 hour(s)).  Blood Culture:    Component Value Date/Time   SDES CSF 09/23/2012 1759   SDES CSF 09/23/2012 1759   SPECREQUEST NONE 09/23/2012 1759   SPECREQUEST NONE 09/23/2012 1759   CULT NO GROWTH 3 DAYS 09/23/2012 1759   REPTSTATUS  09/23/2012 FINAL 09/23/2012 1759   REPTSTATUS 09/27/2012 FINAL 09/23/2012 1759    Studies/Results: Dg Chest 2 View  04/17/2013   CLINICAL DATA:  Chest pain.  EXAM: CHEST  2 VIEW  COMPARISON:  02/24/2013  FINDINGS: Normal heart size and mediastinal contours. No acute infiltrate or edema. No effusion or pneumothorax. No acute osseous findings. Cholecystectomy changes.  IMPRESSION: No active cardiopulmonary disease.   Electronically Signed   By: Jorje Guild M.D.   On: 04/17/2013 07:30   Ct Angio Chest Pe W/cm &/or Wo Cm  04/17/2013   CLINICAL DATA:  Chest pain for 3 hr sharp pain central chest  EXAM: CT ANGIOGRAPHY CHEST WITH CONTRAST  TECHNIQUE: Multidetector CT imaging of the chest was performed using the standard protocol during bolus administration of intravenous contrast. Multiplanar CT image reconstructions and MIPs were obtained to evaluate the vascular anatomy.  CONTRAST:  12m OMNIPAQUE IOHEXOL 350 MG/ML SOLN  COMPARISON:  DG CHEST 2 VIEW dated 04/17/2013; DG CHEST 2 VIEW dated 02/24/2013  FINDINGS: Lungs are clear. No filling defects in the pulmonary arterial system. Thoracic aorta is not dilated. There are small nonpathologic mediastinal lymph nodes. No pleural effusions. No pneumothorax. Thoracic inlet is normal.  Posterior laterally in the left T9 vertebral body there is a 2 cm circumscribed partially lytic lesion with thick trabeculation. The features are not specific but suggests that this may represent a benign hemangioma. Scans through the upper abdomen are negative.  Review of the MIP images confirms the above findings.  IMPRESSION: No acute findings.  No pulmonary embolism.   Electronically Signed   By: RSkipper ClicheM.D.   On: 04/17/2013 09:20   Nm Myocar Multi W/spect W/wall Motion / Ef  04/18/2013   CLINICAL DATA:  Chest pain  EXAM: Lexiscan Myovue  TECHNIQUE: The patient received IV Lexiscan .463mover 15 seconds. 33.0 mCi of Technetium 9948mstamibi injected at 30 seconds.  Quantitative SPECT images were obtained in the vertical, horizontal and short axis planes after a 45 minute delay. Rest images were obtained with similar planes and delay using 10.2 mCi of Technetium 76m39mtamibi.  FINDINGS: ECG:  NSR, no ischemic changes with Lexiscan infusion.  Symptoms:  No reported  Quantitiative Gated SPECT EF:  60%, normal wall motion.  Perfusion Images: There was a small  to medium-sized, moderate perfusion defect involving the apical anterior and apical septal wall segments that was noted both at rest and with Lexiscan stress.  IMPRESSION: 1.  Normal EF and wall motion.  2. Small to medium-sized fixed apical septal and apical anterior perfusion defect. Cannot rule out infarction, but given normal wall motion, suspect this represents soft tissue attenuation. No ischemia. Low risk study.  Dalton Mclean   Electronically Signed   By: Loralie Champagne M.D.   On: 04/18/2013 14:27    Medications:  Scheduled Meds: . aspirin EC  81 mg Oral Daily  . enoxaparin (LOVENOX) injection  40 mg Subcutaneous Q24H  . insulin aspart  0-15 Units Subcutaneous TID WC  . levETIRAcetam  500 mg Oral BID  . multivitamin with minerals  1 tablet Oral Daily  . nicotine  14 mg Transdermal Daily  . sodium chloride  3 mL Intravenous Q12H   Continuous Infusions:  PRN Meds:.sodium chloride, acetaminophen, LORazepam, nitroGLYCERIN, sodium chloride  Antibiotics: Anti-infectives   None     Antibiotics Given (last 72 hours)   None      Day of Hospitalization:  1 Consults: Treatment Team:  Rounding Lbcardiology, MD  Assessment/Plan:   Principal Problem:   Chest pain Active Problems:   Atypical chest pain   Multiple sclerosis   Tobacco abuse   DM2 (diabetes mellitus, type 2)   Seizure  #Atypical Chest Pain- Troponins X3 negative.  Most likely d/t anxiety as pt reports anxiety attacks which wake her up form sleep every night.  Cardiology consulted and Myoview today.  HA1C 04/18/13: 8.6. Lipid  panel triglycerides elevated at 193 and HDL low at 22 (most likely d/t smoking/sedentary lifestyle).  -morphine, NTG, acetaminophen PRN  -ASA 78m daily  -smoking/substance abuse cessation counseling   #Uncontrolled/Controlled Diabetes Mellitus II - According to pt, she has been out of her metformin since September, which may account for her elevated HA1C.  Lab Results  Component Value Date   HGBA1C 8.6* 04/17/2013  -SSI-M  -ac and hs cbg   #?Multiple sclerosis -  Per pt report; [facility]. [facility] and he did not suspect MS based on LP results but was going to review MRI/outside records. His plan was to get EEG and EMG/NCS.  -tylenol PRN for pain (pt allergic to codeine) -begin venlafaxine XR 37.529mdaily   #Seizures - Pt denies any episodes of seizure activity since on keppra.  -continue keppra 50039mid   #Anxiety - Pt reports anxiety attacks that wake her up from sleep at night.  Pt really needs to be on a long-term SSRI. -begin venlafaxine XR 37.5mg74mily -ativan 0.5mg 62m  #?h/o Crohns -  Per pt report; she reports a h/o Crohns 20 years ago and reports her last colonoscopy was 20 yrs. ago. She has never been on treatment for this and reports diarrhea at times for which she takes imodium.  -f/u with PCP   #Microcytic anemia -  H/H stable; baseline hgb ~11.Pt has no symptoms/signs of bleeding. Most likely d/t iron-deficiency anemia as pt reports having heavy periods. Last LMP was 04/03/13-04/07/13. Ferritin 16.  -monitor for bleeding  -follow-up with PCP for further workup   #Protein-calorie malnutrition, severe - Nutrition has no further recommendations.   #Substance abuse -  Pt has a h/o tobacco use.  -MVI  -nicotine patch 14mg 30my  -CSW consult   #Deconditioning -  -PT/OT   #  FEN-  NS-None  Electrolytes- Hyponatremia -Resolved; pt appears euvolemic on exam; corrected sodium:  137; TSH 1.669.   Diet-Carb modified after Myoview  #VTE prophylaxis- lovenox 59m SQ qd   #Dispo- Disposition is deferred at this time, awaiting improvement of current medical problems.  Anticipated discharge in approximately 1-2 day(s).    LOS: 1 day   JMichail Jewels MD PGY-1, Internal Medicine  [phone](7AM-5PM Mon-Fri) 04/18/2013, 3:05 PM

## 2013-04-18 NOTE — Progress Notes (Signed)
    [facility]   Patient's HIV EIA POSITIVE  I WILL ORDER HIV ULTRA REFLEX TO GENOTYPE IN AM ALONG WITH CD4, Hep panel,   [facility] CAN TAKE A WEEK TO COME BACK  PT IS ON DR. Henreitta Leber TEAM AND WE WOULD BE HAPPY TO TAKE CARE OF HER IF SHE HAS HIV INFECTION

## 2013-04-19 DIAGNOSIS — D509 Iron deficiency anemia, unspecified: Secondary | ICD-10-CM

## 2013-04-19 DIAGNOSIS — E1165 Type 2 diabetes mellitus with hyperglycemia: Secondary | ICD-10-CM

## 2013-04-19 DIAGNOSIS — F411 Generalized anxiety disorder: Secondary | ICD-10-CM

## 2013-04-19 DIAGNOSIS — F17200 Nicotine dependence, unspecified, uncomplicated: Secondary | ICD-10-CM

## 2013-04-19 DIAGNOSIS — Z21 Asymptomatic human immunodeficiency virus [HIV] infection status: Secondary | ICD-10-CM

## 2013-04-19 DIAGNOSIS — E43 Unspecified severe protein-calorie malnutrition: Secondary | ICD-10-CM

## 2013-04-19 DIAGNOSIS — IMO0001 Reserved for inherently not codable concepts without codable children: Secondary | ICD-10-CM

## 2013-04-19 LAB — HEPATITIS PANEL, ACUTE
HCV Ab: NEGATIVE
Hep A IgM: NONREACTIVE
Hep B C IgM: NONREACTIVE
Hepatitis B Surface Ag: NEGATIVE

## 2013-04-19 LAB — T-HELPER CELLS (CD4) COUNT (NOT AT ARMC)
CD4 % Helper T Cell: 64 % — ABNORMAL HIGH (ref 33–55)
CD4 T CELL ABS: 1840 /uL (ref 400–2700)

## 2013-04-19 LAB — GLUCOSE, CAPILLARY
GLUCOSE-CAPILLARY: 174 mg/dL — AB (ref 70–99)
Glucose-Capillary: 198 mg/dL — ABNORMAL HIGH (ref 70–99)
Glucose-Capillary: 161 mg/dL — ABNORMAL HIGH (ref 70–99)
Glucose-Capillary: 166 mg/dL — ABNORMAL HIGH (ref 70–99)

## 2013-04-19 MED ORDER — IBUPROFEN 200 MG PO TABS
200.0000 mg | ORAL_TABLET | Freq: Four times a day (QID) | ORAL | Status: DC | PRN
  Administered 2013-04-19 – 2013-04-20 (×3): 200 mg via ORAL
  Filled 2013-04-19 (×3): qty 1

## 2013-04-19 NOTE — Progress Notes (Signed)
Mother Vevelyn Francois) called RN this am very concerned about pt.  States her daughter is in a lot of pain and is crying on the phone and does not know what is going on.  Significant other Alvino Chapel) at bedside for about 2 hours, from 2-4pm.  He is also very concerned and would like to know what the "plan of attack" is and what is being done for pt.  Pt states that the only thing the MD's have communicated to her is that her heart is normal and Cardiology is signing off for now.  MD made aware.

## 2013-04-19 NOTE — Progress Notes (Signed)
Occupational Therapy Evaluation Patient Details Name: Andrea Wade MRN: [id number] DOB: Jul 07, 1970 Today's Date: 04/19/2013 Time: 1324-4010 OT Time Calculation (min): 28 min  OT Assessment / Plan / Recommendation History of present illness 43 y.o. female admitted to [facility] on 04/17/13 with PMH of uncontrolled DMII,  ?seizures, anxiety, arthritis, crohn's disease (not on treatment), and ?MS (not on treatment) who presents to the ED with left-sided non-exertional chest pain.  Dx with atypical chest pain and uncontrolled DM.     Clinical Impression   PTA, pt states that ADL and mobility have become more difficult. States she falls 3-4 times /day and has less function with B hands. Apparent B hand intrinsic wasting. Pt states she was diagnosed with MS but has not had any therapy. Feel pt is excellent CIR candidate to max function and strength for ADL and mobility to reduce falls, decrease burden of care and reduce likelihood of hospital readmission. Pt will benefit from skilled OT services to facilitate D/C to next venue due to below deficits.    OT Assessment  Patient needs continued OT Services    Follow Up Recommendations  CIR;Supervision - Intermittent    Barriers to Discharge Decreased caregiver support boyfriend works during the day  Equipment Recommendations  Other (comment);Tub/shower bench (power w/c)    Recommendations for Other Services Rehab consult  Frequency  Min 3X/week    Precautions / Restrictions Precautions Precautions: Fall Precaution Comments: due to generalized weakness Restrictions Weight Bearing Restrictions: No   Pertinent Vitals/Pain C/o chest pain    ADL  Eating/Feeding: Minimal assistance;Other (comment) (unable to cut up food. given built up tubing) Where Assessed - Eating/Feeding: Chair Grooming: Moderate assistance Where Assessed - Grooming: Unsupported sitting Upper Body Bathing: Moderate assistance Where Assessed - Upper Body Bathing: Unsupported  sitting Lower Body Bathing: Maximal assistance Where Assessed - Lower Body Bathing: Supported sit to stand Upper Body Dressing: Maximal assistance Where Assessed - Upper Body Dressing: Unsupported sitting Lower Body Dressing: Maximal assistance Where Assessed - Lower Body Dressing: Supported sit to Lobbyist: Moderate assistance Toilet Transfer Method: Sit to stand;Stand pivot Where Assessed - Toileting Clothing Manipulation and Hygiene: Sit to stand from 3-in-1 or toilet Equipment Used: Gait belt Transfers/Ambulation Related to ADLs: mod A ADL Comments: difficulty due to B hand weakness/decreased funciton    OT Diagnosis: Generalized weakness;Acute pain;Paresis  OT Problem List: Decreased strength;Decreased range of motion;Decreased activity tolerance;Impaired balance (sitting and/or standing);Decreased coordination;Decreased knowledge of use of DME or AE;Cardiopulmonary status limiting activity;Impaired sensation;Impaired tone;Impaired UE functional use;Pain OT Treatment Interventions: Self-care/ADL training;Therapeutic exercise;Neuromuscular education;Energy conservation;DME and/or AE instruction;Therapeutic activities;Splinting;Patient/family education;Balance training   OT Goals(Current goals can be found in the care plan section) Acute Rehab OT Goals Patient Stated Goal: to use my hands better OT Goal Formulation: With patient Time For Goal Achievement: 05/03/13 Potential to Achieve Goals: Good  Visit Information  Last OT Received On: 04/19/13 Assistance Needed: +1 History of Present Illness: 43 y.o. female admitted to [facility] on 04/17/13 with PMH of uncontrolled DMII,  ?seizures, anxiety, arthritis, crohn's disease (not on treatment), and ?MS (not on treatment) who presents to the ED with left-sided non-exertional chest pain.  Dx with atypical chest pain and uncontrolled DM.         Prior [place] expects to be discharged to::  Private residence Living Arrangements: Spouse/significant other (boyfriend) Available Help at Discharge: Family;Available PRN/intermittently (boyfriend drives trucks for a living) Type of Home: House Home Access: [place]  Layout: One level Home Equipment: Latina Craver - 2 wheels;Wheelchair - manual;Shower seat;Bedside commode Prior Function Level of Independence: Needs assistance Gait / Transfers Assistance Needed: assist needed for bed mobility, transfers, gait ADL's / Homemaking Assistance Needed: needs help getting dressed, getting bathed Communication / Swallowing Assistance Needed: NA Comments: Pt has toilet chair next to the bed to use while boyfriend is away at work.   (states she falls daily) Communication Communication: No difficulties Dominant Hand: Right         Vision/Perception Vision - History Baseline Vision: Wears glasses only for reading Vision - Assessment Eye Alignment: Within Functional Limits   Cognition  Cognition Arousal/Alertness: Awake/alert Behavior During Therapy: WFL for tasks assessed/performed Overall Cognitive Status: Within Functional Limits for tasks assessed    Extremity/Trunk Assessment Upper Extremity Assessment Upper Extremity Assessment: Generalized weakness Lower Extremity Assessment RLE Deficits / Details: both right and left leg 2 to 2+/5 strength throughout per MMT supine in the bed, however, her functional ability was much better than her demonstrated strength testing,  When asked to lift her legs during strength testing pt did so with a significant amount of effort and pain, but when putting her socks on to get to EOB, pt was able with less effort and no signs of pain to lift both legs.  Pt should have had signficiant difficulty in standing with either bil legs buckling or knees hyperextended and relying on bed for support.  Instead pt was able to stand on knees with the support of the walker with no signs of buckling  or hyperextension, instead she presented like her legs hurt.   LLE Deficits / Details: both right and left leg 2 to 2+/5 strength throughout per MMT supine in the bed, however, her functional ability was much better than her demonstrated strength testing,  When asked to lift her legs during strength testing pt did so with a significant amount of effort and pain, but when putting her socks on to get to EOB, pt was able with less effort and no signs of pain to lift both legs.  Pt should have had signficiant difficulty in standing with either bil legs buckling or knees hyperextended and relying on bed for support.  Instead pt was able to stand on knees with the support of the walker with no signs of buckling or hyperextension, instead she presented like her legs hurt.   Cervical / Trunk Assessment Cervical / Trunk Assessment: Other exceptions Cervical / Trunk Exceptions: pt reports that she has a bulging disc in her neck.       Mobility Bed Mobility Overal bed mobility: Needs Assistance Bed Mobility: Supine to Sit Supine to sit: Min assist General bed mobility comments: Min assist to support trunk to get to sitting.  Assist also needed to help progress bil legs over the side of the bed.  Pt not opulling with her arms due to pain, but was able with good trunk strength do almost a full sit up to get to the side of the bed.   Transfers Overall transfer level: Needs assistance Equipment used: Rolling walker (2 wheeled) Transfers: Sit to/from Stand Sit to Stand: Mod assist General transfer comment: more difficulty from lower surfaces     Exercise     Balance Balance Sitting balance-Leahy Scale: Good Standing balance-Leahy Scale: Poor General Comments General comments (skin integrity, edema, etc.): B intrinsic hand wasting   End of Session OT - End of Session Equipment Utilized During Treatment: Gait belt Activity Tolerance:  Patient tolerated treatment well Patient left: in chair;with call  bell/phone within reach Nurse Communication: Mobility status  GO Functional Assessment Tool Used: clinical judgement Functional Limitation: Self care Self Care Current Status (S1423): At least 60 percent but less than 80 percent impaired, limited or restricted Self Care Goal Status (T5320): At least 1 percent but less than 20 percent impaired, limited or restricted   [facility] 04/19/2013, 5:55 PM [facility], OTR/L  [phone] 04/19/2013

## 2013-04-19 NOTE — Progress Notes (Signed)
UR completed 

## 2013-04-19 NOTE — Progress Notes (Signed)
Physical Therapy Treatment Patient Details Name: Andrea Wade MRN: [id number] DOB: 1970-07-30 Today's Date: 04/19/2013 Time: 7619-5093 PT Time Calculation (min): 15 min  PT Assessment / Plan / Recommendation  History of Present Illness 43 y.o. female admitted to [facility] on 04/17/13 with PMH of uncontrolled DMII,  ?seizures, anxiety, arthritis, crohn's disease (not on treatment), and ?MS (not on treatment) who presents to the ED with left-sided non-exertional chest pain.  Dx with atypical chest pain and uncontrolled DM.     PT Comments   Pt continues to be limited by pain in UEs and LEs, able to gait short distance today with min A.  Follow Up Recommendations  Home health PT;Supervision for mobility/OOB     Does the patient have the potential to tolerate intense rehabilitation     Barriers to Discharge        Equipment Recommendations  None recommended by PT    Recommendations for Other Services    Frequency Min 3X/week   Progress towards PT Goals Progress towards PT goals: Progressing toward goals  Plan Current plan remains appropriate    Precautions / Restrictions Precautions Precautions: Fall Restrictions Weight Bearing Restrictions: No   Pertinent Vitals/Pain Pt c/o constant UE/LE pain, RN aware    Mobility  Bed Mobility Overal bed mobility: Modified Independent General bed mobility comments: took increased time, cues for technique to get hips to edge of bed, limited use of UEs due to pain Transfers Equipment used: Rolling walker (2 wheeled) Sit to Stand: Min guard General transfer comment: support at trunk for standing, cues for UE placement Ambulation/Gait Ambulation/Gait assistance: Min guard Ambulation Distance (Feet): 15 Feet Assistive device: Rolling walker (2 wheeled) Gait velocity interpretation: Below normal speed for age/gender General Gait Details: pt unable to grip RW but able to push with open palms, narrow BOS, decreased step and stride length     Exercises     PT Diagnosis:    PT Problem List:   PT Treatment Interventions:     PT Goals (current goals can now be found in the care plan section)    Visit Information  Last PT Received On: 04/19/13 Assistance Needed: +1 History of Present Illness: 43 y.o. female admitted to [facility] on 04/17/13 with PMH of uncontrolled DMII,  ?seizures, anxiety, arthritis, crohn's disease (not on treatment), and ?MS (not on treatment) who presents to the ED with left-sided non-exertional chest pain.  Dx with atypical chest pain and uncontrolled DM.      Subjective Data      Cognition  Cognition Arousal/Alertness: Awake/alert Behavior During Therapy: WFL for tasks assessed/performed Overall Cognitive Status: Within Functional Limits for tasks assessed    Balance     End of Session PT - End of Session Equipment Utilized During Treatment: Gait belt Activity Tolerance: Patient limited by fatigue;Patient limited by pain Patient left: in bed;with call bell/phone within reach;with family/visitor present   GP Functional Assessment Tool Used: assist level Functional Limitation: Mobility: Walking and moving around Mobility: Walking and Moving Around Current Status ([id number]): At least 20 percent but less than 40 percent impaired, limited or restricted Mobility: Walking and Moving Around Goal Status [phone]): At least 1 percent but less than 20 percent impaired, limited or restricted   [facility] 04/19/2013, 2:26 PM

## 2013-04-19 NOTE — Progress Notes (Signed)
Subjective:   Pt reports her CP is better this AM.  She c/o back pain with  bilateral UE and LE pain that she describes as "crampy."   She is lying in bed with a heating pad on.   Objective:   Vital signs in last 24 hours: Filed Vitals:   04/18/13 1441 04/18/13 2041 04/19/13 0611 04/19/13 0937  BP: 113/67 113/69 100/70 122/72  Pulse: 91 93  91  Temp: 99.1 F (37.3 C) 97.9 F (36.6 C) 98.1 F (36.7 C) 98.7 F (37.1 C)  TempSrc: Oral Oral Oral Oral  Resp: 18 18 18 14   Height:      Weight:   201 lb 14.4 oz (91.581 kg)   SpO2: 96% 95% 95% 96%   Weight change: -6 lb 14 oz (-3.119 kg)  Intake/Output Summary (Last 24 hours) at 04/19/13 1612 Last data filed at 04/19/13 0814  Gross per 24 hour  Intake    243 ml  Output   1150 ml  Net   -907 ml    Physical Exam: Constitutional: Vital signs reviewed. Patient is a pale, unkempt, middle-age female who appears older than her stated age in NAD and cooperative with exam.  Head: Normocephalic and atraumatic  Eyes: PERRL, EOMI, pale conjunctivae, no scleral icterus  Throat: endentulous; moist mucous membranes  Neck: Supple, Trachea midline  Cardiovascular: RRR, no MRG, pulses symmetric and intact bilaterally  Pulmonary/Chest: tenderness to palpation of left upper chest/midchest (location of CP), normal respiratory effort, CTAB, no wheezes, rales, or rhonchi  Abdominal: Soft. +BS, diffuse tenderness without rebound, guarding, or organomegaly.  Musculoskeletal: No joint deformities, erythema, or stiffness Neurological: A&O x3, cranial nerve II-XII are grossly intact, no focal motor deficit, pt able to lift LE slightly b/l (likely d/t poor effort), pt grip strength poor (likely d/t poor effort) but sensation is intact b/l.  Skin: Warm, dry and intact. No rash, cyanosis, or clubbing.  Psychiatric: Normal mood and affect.    Lab Results:  BMP:  Recent Labs Lab 04/17/13 0628 04/17/13 1731 04/18/13 0750  NA 132*  --  134*  K 3.7   --  4.1  CL 96  --  96  CO2 23  --  25  GLUCOSE 316*  --  199*  BUN 5*  --  4*  CREATININE 0.43*  --  0.44*  CALCIUM 9.2  --  9.1  MG  --  1.8  --   PHOS  --  3.8  --     CBC:  Recent Labs Lab 04/17/13 0628 04/18/13 0750  WBC 9.5 9.5  NEUTROABS 6.1  --   HGB 11.4* 10.6*  HCT 34.5* 32.4*  MCV 72.9* 73.1*  PLT 482* 397    Coagulation: No results found for this basename: LABPROT, INR,  in the last 168 hours  CBG:            Recent Labs Lab 04/18/13 0714 04/18/13 1214 04/18/13 1655 04/18/13 2044 04/19/13 0733 04/19/13 1126  GLUCAP 179* 175* 195* 176* 174* 198*           HA1C:       Recent Labs Lab 04/17/13 1731  HGBA1C 8.6*    Lipid Panel:  Recent Labs Lab 04/18/13 0750  CHOL 117  HDL 22*  LDLCALC 56  TRIG 193*  CHOLHDL 5.3    LFTs:  Recent Labs Lab 04/17/13 0628  AST 15  ALT 12  ALKPHOS 95  BILITOT <0.2*  PROT 8.4*  ALBUMIN 2.9*  Pancreatic Enzymes:  Recent Labs Lab 04/17/13 0647  LIPASE 44    Ammonia: No results found for this basename: AMMONIA,  in the last 168 hours  Cardiac Enzymes:  Recent Labs Lab 04/17/13 0647 04/17/13 1731 04/17/13 2157  TROPONINI <0.30 <0.30 <0.30   Lab Results  Component Value Date   CKTOTAL 44 09/23/2012   TROPONINI <0.30 04/17/2013    EKG:  Date/Time:  Monday April 17 2013 06:21:47 EST Ventricular Rate:  94 PR Interval:  175 QRS Duration: 99 QT Interval:  353 QTC Calculation: 441 R Axis:   -50 Text Interpretation:  Sinus rhythm Left anterior fascicular block Abnormal R-wave progression, late transition Nonspecific T abnormalities, anterior leads ED PHYSICIAN INTERPRETATION AVAILABLE IN [facility] Confirmed by TEST, RECORD ([id number]) on 04/19/2013 7:14:28 AM  BNP: No results found for this basename: PROBNP,  in the last 168 hours  D-Dimer:  Recent Labs Lab 04/17/13 0647  DDIMER 5.48*    Urinalysis:  Recent Labs Lab 04/17/13 0946  COLORURINE YELLOW  LABSPEC  1.014  PHURINE 6.5  GLUCOSEU 250*  HGBUR TRACE*  BILIRUBINUR NEGATIVE  KETONESUR NEGATIVE  PROTEINUR NEGATIVE  UROBILINOGEN 0.2  NITRITE NEGATIVE  LEUKOCYTESUR NEGATIVE    Micro Results: No results found for this or any previous visit (from the past 240 hour(s)).  Blood Culture:    Component Value Date/Time   SDES CSF 09/23/2012 1759   SDES CSF 09/23/2012 1759   SPECREQUEST NONE 09/23/2012 1759   SPECREQUEST NONE 09/23/2012 1759   CULT NO GROWTH 3 DAYS 09/23/2012 1759   REPTSTATUS 09/23/2012 FINAL 09/23/2012 1759   REPTSTATUS 09/27/2012 FINAL 09/23/2012 1759    Studies/Results: Nm Myocar Multi W/spect Tamela Oddi Motion / Ef  04/18/2013   CLINICAL DATA:  Chest pain  EXAM: Lexiscan Myovue  TECHNIQUE: The patient received IV Lexiscan .35m over 15 seconds. 33.0 mCi of Technetium 937mestamibi injected at 30 seconds. Quantitative SPECT images were obtained in the vertical, horizontal and short axis planes after a 45 minute delay. Rest images were obtained with similar planes and delay using 10.2 mCi of Technetium 9957mstamibi.  FINDINGS: ECG:  NSR, no ischemic changes with Lexiscan infusion.  Symptoms:  No reported  Quantitiative Gated SPECT EF:  60%, normal wall motion.  Perfusion Images: There was a small to medium-sized, moderate perfusion defect involving the apical anterior and apical septal wall segments that was noted both at rest and with Lexiscan stress.  IMPRESSION: 1.  Normal EF and wall motion.  2. Small to medium-sized fixed apical septal and apical anterior perfusion defect. Cannot rule out infarction, but given normal wall motion, suspect this represents soft tissue attenuation. No ischemia. Low risk study.  Dalton Mclean   Electronically Signed   By: DalLoralie ChampagneD.   On: 04/18/2013 14:27    Medications:  Scheduled Meds: . aspirin EC  81 mg Oral Daily  . enoxaparin (LOVENOX) injection  40 mg Subcutaneous Q24H  . insulin aspart  0-15 Units Subcutaneous TID WC  . levETIRAcetam   500 mg Oral BID  . multivitamin with minerals  1 tablet Oral Daily  . nicotine  14 mg Transdermal Daily  . sodium chloride  3 mL Intravenous Q12H  . venlafaxine XR  37.5 mg Oral Q breakfast   Continuous Infusions:  PRN Meds:.sodium chloride, acetaminophen, LORazepam, nitroGLYCERIN, sodium chloride  Antibiotics: Anti-infectives   None     Antibiotics Given (last 72 hours)   None      Day of Hospitalization:  2  Consults: Treatment Team:  Rounding Lbcardiology, MD  Assessment/Plan:   Principal Problem:   Chest pain Active Problems:   Atypical chest pain   Multiple sclerosis   Tobacco abuse   DM2 (diabetes mellitus, type 2)   Seizure  #Atypical Chest Pain- Improved.  Troponins X3 negative.  Most likely d/t anxiety as pt reports anxiety attacks which wake her up form sleep every night.  Cardiology consulted and Myoview was negative for obstruction.  HA1C 04/18/13: 8.6. Lipid panel triglycerides elevated at 193 and HDL low at 22 (most likely d/t smoking/sedentary lifestyle).  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  Viral load pending (phlebotomy did not get an adequate sample and must be repeated today).  CD4 count: 1840.  -d/c pending viral load  #Uncontrolled/Controlled Diabetes Mellitus II - Stable. According to pt, she has been out of her metformin since September, which may account for her elevated HA1C.  Lab Results  Component Value Date   HGBA1C 8.6* 04/17/2013   CBG (last 3)   Recent Labs  04/18/13 2044 04/19/13 0733 04/19/13 1126  GLUCAP 176* 174* 198*   -SSI-M  -ac and hs cbg   #?Multiple sclerosis -  Per pt report; [facility]. [facility] and he did not suspect MS based on LP results but was going to review MRI/outside records. His  plan was to get EEG and EMG/NCS.  -tylenol PRN for pain (pt allergic to codeine) -continue venlafaxine XR 37.545mdaily   #Seizures - Stable. Pt denies any episodes of seizure activity since on keppra.  -continue keppra 50039mid   #Anxiety - Pt reports anxiety attacks that wake her up from sleep at night.  Pt really needs to be on a long-term SSRI. -begin venlafaxine XR 37.5mg87mily -ativan 0.5mg 3m  #?h/o Crohns -  Per pt report; she reports a h/o Crohns 20 years ago and reports her last colonoscopy was 20 yrs. ago. She has never been on treatment for this and reports diarrhea at times for which she takes imodium.  [place] [facility] -  H/H stable; baseline hgb ~11.Pt has no symptoms/signs of bleeding. Most likely d/t iron-deficiency anemia as pt reports having heavy periods. Last LMP was 04/03/13-04/07/13. Ferritin 16.  -monitor for bleeding  [place]   #Protein-calorie malnutrition, severe - Nutrition has no further recommendations.   #Substance abuse -  Pt has a h/o tobacco use.  -MVI  -nicotine patch 14mg 33my  -CSW consult   #Deconditioning -  -PT/OT   #FEN-  NS-None  Electrolytes- Hyponatremia -Resolved; pt appears euvolemic on exam; corrected sodium: 137; TSH 1.669.   Diet-Carb modified after Myoview  #VTE prophylaxis- lovenox 40mg S98m   #Dispo- Disposition is deferred at this time, awaiting improvement of current medical problems.  Anticipated discharge in approximately 1-2 day(s).    LOS: 2 days   JacquelMichail JewelsY-1, Internal Medicine  [phone] Mon-Fri) 04/19/2013, 4:12 PM

## 2013-04-19 NOTE — Progress Notes (Signed)
Rehab Admissions Coordinator Note:  Patient was screened by Cleatrice Burke for appropriateness for an Inpatient Acute Rehab Consult per O.T. Recommendation today.  At this time, we are recommending Inpatient Rehab consult. Please place an order if you feel appropriate.  Cleatrice Burke 04/19/2013, 6:49 PM  I can be reached at [phone].

## 2013-04-19 NOTE — Progress Notes (Signed)
PT recommending HHPT, but pt will NOT be able to receive these services due to: 1-pt is self pay and 2-no appropriate diagnosis (per Case Management).  MD made aware.

## 2013-04-19 NOTE — Progress Notes (Signed)
Subjective: Her neck seems to be the main issue now.   Objective: Vital signs in last 24 hours: Temp:  [97.9 F (36.6 C)-99.1 F (37.3 C)] 98.1 F (36.7 C) (02/18 0611) Pulse Rate:  [91-93] 93 (02/17 2041) Resp:  [18] 18 (02/18 0611) BP: (100-134)/(61-78) 100/70 mmHg (02/18 0611) SpO2:  [95 %-96 %] 95 % (02/18 0611) Weight:  [201 lb 14.4 oz (91.581 kg)] 201 lb 14.4 oz (91.581 kg) (02/18 0611) Last BM Date: 04/17/13  Intake/Output from previous day: 02/17 0701 - 02/18 0700 In: 243 [P.O.:240; I.V.:3] Out: 1750 [Urine:1750] Intake/Output this shift:    Medications Current Facility-Administered Medications  Medication Dose Route Frequency Provider Last Rate Last Dose  . 0.9 %  sodium chloride infusion  250 mL Intravenous PRN Othella Boyer, MD      . acetaminophen (TYLENOL) tablet 650 mg  650 mg Oral Q6H PRN Michail Jewels, MD   650 mg at 04/18/13 1646  . aspirin EC tablet 81 mg  81 mg Oral Daily Othella Boyer, MD   81 mg at 04/18/13 1211  . enoxaparin (LOVENOX) injection 40 mg  40 mg Subcutaneous Q24H Neema Bobbie Stack, MD   40 mg at 04/18/13 1721  . insulin aspart (novoLOG) injection 0-15 Units  0-15 Units Subcutaneous TID WC Othella Boyer, MD   3 Units at 04/19/13 0818  . levETIRAcetam (KEPPRA) tablet 500 mg  500 mg Oral BID Othella Boyer, MD   500 mg at 04/18/13 2137  . LORazepam (ATIVAN) tablet 0.5 mg  0.5 mg Oral QHS PRN Michail Jewels, MD   0.5 mg at 04/18/13 2137  . multivitamin with minerals tablet 1 tablet  1 tablet Oral Daily Michail Jewels, MD      . nicotine (NICODERM CQ - dosed in mg/24 hours) patch 14 mg  14 mg Transdermal Daily Michail Jewels, MD      . nitroGLYCERIN (NITROSTAT) SL tablet 0.4 mg  0.4 mg Sublingual Q5 Min x 3 PRN Neema Bobbie Stack, MD      . sodium chloride 0.9 % injection 3 mL  3 mL Intravenous Q12H Othella Boyer, MD   3 mL at 04/18/13 2137  . sodium chloride 0.9 % injection 3 mL  3 mL Intravenous PRN Othella Boyer, MD      . venlafaxine XR  (EFFEXOR-XR) 24 hr capsule 37.5 mg  37.5 mg Oral Q breakfast Michail Jewels, MD   37.5 mg at 04/19/13 0818    PE: General appearance: alert, cooperative and appears uncomfortable.  Lungs: clear to auscultation bilaterally  Heart: regular rate and rhythm, S1, S2 normal, no murmur, click, rub or gallop  Extremities: No LEE  Pulses: 2+ and symmetric  Skin: Warm and dry  Neurologic: Grossly normal   Lab Results:   Recent Labs  04/17/13 0628 04/18/13 0750  WBC 9.5 9.5  HGB 11.4* 10.6*  HCT 34.5* 32.4*  PLT 482* 397   BMET  Recent Labs  04/17/13 0628 04/18/13 0750  NA 132* 134*  K 3.7 4.1  CL 96 96  CO2 23 25  GLUCOSE 316* 199*  BUN 5* 4*  CREATININE 0.43* 0.44*  CALCIUM 9.2 9.1   PT/INR No results found for this basename: LABPROT, INR,  in the last 72 hours Cholesterol  Recent Labs  04/18/13 0750  CHOL 117   Lipid Panel     Component Value Date/Time   CHOL 117 04/18/2013 0750   TRIG 193* 04/18/2013 0750  HDL 22* 04/18/2013 0750   CHOLHDL 5.3 04/18/2013 0750   VLDL 39 04/18/2013 0750   LDLCALC 56 04/18/2013 0750    Assessment/Plan  Principal Problem:   Chest pain Active Problems:   Atypical chest pain   Multiple sclerosis   Tobacco abuse   DM2 (diabetes mellitus, type 2)   Seizure  Plan:   43 yo with no previous history of CAD. CRF, Diabetes and smoking. 3:00 am awoke with sharp SSCP. Pain non pleuritic no positional. Has history of lap choly with retained stone   SP lexiscan myovue revealing normal EF and wall motion.  No ischemia. Low risk study.  Ruled out for MI. ASA   Bp and HR controlled.   Uncontrolled DM. A1C 8.6. insilin  Nicotine patch currently.   Will sign off.   LOS: 2 days    HAGER, BRYAN 04/19/2013 8:32 AM  I have seen and examined the patient along with Tarri Fuller, PA.  I have reviewed the chart, notes and new data.  I agree with PA's note.  PLAN: No further cardiac workup indicated - focus on risk factor modification,  especially smoking cessation and better glycemic control.  Sanda Klein, MD, [place] [phone] 04/19/2013, 9:56 AM

## 2013-04-20 DIAGNOSIS — E1142 Type 2 diabetes mellitus with diabetic polyneuropathy: Secondary | ICD-10-CM

## 2013-04-20 DIAGNOSIS — E1149 Type 2 diabetes mellitus with other diabetic neurological complication: Secondary | ICD-10-CM

## 2013-04-20 DIAGNOSIS — G35D Multiple sclerosis, unspecified: Secondary | ICD-10-CM

## 2013-04-20 DIAGNOSIS — G35 Multiple sclerosis: Secondary | ICD-10-CM

## 2013-04-20 LAB — GLUCOSE, CAPILLARY
GLUCOSE-CAPILLARY: 169 mg/dL — AB (ref 70–99)
GLUCOSE-CAPILLARY: 156 mg/dL — AB (ref 70–99)
Glucose-Capillary: 162 mg/dL — ABNORMAL HIGH (ref 70–99)

## 2013-04-20 LAB — HIV-1 RNA ULTRAQUANT REFLEX TO GENTYP+
HIV 1 RNA Quant: <20 copies/mL (ref <20)
HIV-1 RNA Quant, Log: <1.3 {Log} (ref <1.30)

## 2013-04-20 LAB — HIV 1/2 CONFIRMATION
HIV-1 antibody: NEGATIVE
HIV-2 Ab: NEGATIVE

## 2013-04-20 MED ORDER — LORATADINE 10 MG PO TABS: 10.0000 mg | ORAL_TABLET | Freq: Every day | ORAL | Status: DC

## 2013-04-20 MED ORDER — LORATADINE 10 MG PO TABS
10.0000 mg | ORAL_TABLET | Freq: Every day | ORAL | Status: DC
  Administered 2013-04-20: 10 mg via ORAL
  Filled 2013-04-20: qty 1

## 2013-04-20 NOTE — Progress Notes (Signed)
I discussed with Dr. Naaman Plummer his recommendations for rehab options. Patient is not a candidate for an inpatient rehabilitation admission at this time. Please call me for any questions. [phone]

## 2013-04-20 NOTE — Progress Notes (Signed)
HIV ultraquant noted and she is negative for HIV.

## 2013-04-20 NOTE — Progress Notes (Signed)
  Date: 04/20/2013  Patient name: Andrea Wade  Medical record number: [id number]  Date of birth: [birth year]   This patient has been seen and the plan of care was discussed with the house staff. Please see their note for complete details. I concur with their findings with the following additions/corrections:  Waiting for HIV RNA still, hopefully back today.  Also noted rehab concerns and will look into short term rehab.    Thayer Headings, MD 04/20/2013, 9:23 AM

## 2013-04-20 NOTE — Progress Notes (Signed)
Subjective: Doing well, reports a head cold with rhinorrhea this yesterday  Objective: Vital signs in last 24 hours: Filed Vitals:   04/19/13 0937 04/19/13 1415 04/19/13 2010 04/20/13 0521  BP: 122/72 129/78 131/85 111/65  Pulse: 91 86 88 79  Temp: 98.7 F (37.1 C) 97.3 F (36.3 C) 97.8 F (36.6 C) 98 F (36.7 C)  TempSrc: Oral Oral Oral Oral  Resp: 14 18 20 20   Height:      Weight:    201 lb 7.6 oz (91.389 kg)  SpO2: 96% 98% 99% 95%   Weight change: -6.8 oz (-0.192 kg)  Intake/Output Summary (Last 24 hours) at 04/20/13 1118 Last data filed at 04/20/13 0900  Gross per 24 hour  Intake    483 ml  Output      0 ml  Net    483 ml   General: resting in bed Cardiac: RRR, no rubs, murmurs or gallops Pulm: clear to auscultation bilaterally, moving normal volumes of air Abd: soft, nontender, nondistended, BS normoactive  Ext: warm and well perfused, no pedal edema Neuro: alert and oriented X3, cranial nerves II-XII grossly intact, continues to have a weak hand grip, limited by pain  Lab Results: Basic Metabolic Panel:  Recent Labs Lab 04/17/13 0628 04/17/13 1731 04/18/13 0750  NA 132*  --  134*  K 3.7  --  4.1  CL 96  --  96  CO2 23  --  25  GLUCOSE 316*  --  199*  BUN 5*  --  4*  CREATININE 0.43*  --  0.44*  CALCIUM 9.2  --  9.1  MG  --  1.8  --   PHOS  --  3.8  --    CBC:  Recent Labs Lab 04/17/13 0628 04/18/13 0750  WBC 9.5 9.5  NEUTROABS 6.1  --   HGB 11.4* 10.6*  HCT 34.5* 32.4*  MCV 72.9* 73.1*  PLT 482* 397   CBG:  Recent Labs Lab 04/18/13 2044 04/19/13 0733 04/19/13 1126 04/19/13 1642 04/19/13 2007 04/20/13 0756  GLUCAP 176* 174* 198* 161* 166* 162*    Studies/Results: Nm Myocar Multi W/spect W/wall Motion / Ef  04/18/2013   CLINICAL DATA:  Chest pain  EXAM: Lexiscan Myovue  TECHNIQUE: The patient received IV Lexiscan .11m over 15 seconds. 33.0 mCi of Technetium 965mestamibi injected at 30 seconds. Quantitative SPECT images were  obtained in the vertical, horizontal and short axis planes after a 45 minute delay. Rest images were obtained with similar planes and delay using 10.2 mCi of Technetium 9967mstamibi.  FINDINGS: ECG:  NSR, no ischemic changes with Lexiscan infusion.  Symptoms:  No reported  Quantitiative Gated SPECT EF:  60%, normal wall motion.  Perfusion Images: There was a small to medium-sized, moderate perfusion defect involving the apical anterior and apical septal wall segments that was noted both at rest and with Lexiscan stress.  IMPRESSION: 1.  Normal EF and wall motion.  2. Small to medium-sized fixed apical septal and apical anterior perfusion defect. Cannot rule out infarction, but given normal wall motion, suspect this represents soft tissue attenuation. No ischemia. Low risk study.  Dalton Mclean   Electronically Signed   By: DalLoralie ChampagneD.   On: 04/18/2013 14:27   Medications: I have reviewed the patient's current medications. Scheduled Meds: . aspirin EC  81 mg Oral Daily  . enoxaparin (LOVENOX) injection  40 mg Subcutaneous Q24H  . insulin aspart  0-15 Units Subcutaneous TID WC  .  levETIRAcetam  500 mg Oral BID  . multivitamin with minerals  1 tablet Oral Daily  . nicotine  14 mg Transdermal Daily  . sodium chloride  3 mL Intravenous Q12H  . venlafaxine XR  37.5 mg Oral Q breakfast   Continuous Infusions:  PRN Meds:.sodium chloride, acetaminophen, ibuprofen, LORazepam, nitroGLYCERIN, sodium chloride  Assessment/Plan: Ms. Fuerst is a 43 yo F with h/o DM and ?MS/seizures admitted on 04/17/13 with c/o chest pain.  #Atypical CP: Improved. Myoview negative. Not cardiac etiology.  Likely related to anxiety -Will need outpatient follow up -Cont venlafaxine daily (started for anxiety attacks on 04/18/13) -ativan 0.63m PO prn qHS, only during hospitalization  #HIV Positive AB: Viral load in process, concern that patient will fall out of the system. CD4 1840.  #Uncontrolled DM: A1c 8.6, CBGs  improved during hospitalization -restart metformin at d/c  #Seizures: Not active. -cont keppra bid  #Deconditioning: PT/OT have worked with patient. CIR also seeing patient.  TSH wnl -Rec checking RF/ANA to further investigate rheumatological etiology outpatient   #Tobacco abuse: nicotine patch  Dispo: Disposition is deferred at this time, awaiting improvement of current medical problems.  Hopeful to d/c today --> awaiting inpt rehab decision Follow up with [facility]. (@ 2:30 pm- for PCP f/u and pt please call C[facility]encion Partridgeat [phone] financial counseling for orange card assistance (medication assistance). CIR to look at pt today to see if pt is a possible candidate for CIR. Will continue to f/u.  The patient does have a current PCP (Imagene Riches NP) and [facility].  The patient does not have transportation limitations that hinder transportation to clinic appointments.  .Services Needed at time of discharge: Y = Yes, Blank = No PT:   OT:   RN:   Equipment:   Other:     LOS: 3 days   NOthella Boyer MD 04/20/2013, 11:18 AM

## 2013-04-20 NOTE — Consult Note (Signed)
Physical Medicine and Rehabilitation Consult Reason for Consult: Questionable multiple sclerosis Referring Physician: Dr. Linus Salmons   HPI: Andrea Wade is a 43 y.o. right-handed female with history of uncontrolled diabetes mellitus, questionable seizure, Crohn's disease for which Andrea Wade is not on treatment and questionable multiple sclerosis seen by Dr. Lonni Fix in the past September 2014 with workup at that time negative. By report patient used a walker prior to admission. Presented to the emergency department with left-sided nonexertional chest pain that woke her up from sleep. CT angiogram of the chest negative for pulmonary emboli. Cranial CT scan negative for acute abnormalities. Known underlying white matter changes not well characterized on CT. Cardiology services consulted for chest pain with cardiac enzymes negative.[employer] study unremarkable with full workup felt to be atypical chest pain. Infectious disease followup noted HIV antibody reactive and sent for confirmatory testing. Viral load pending/CD4 count 1840. Subcutaneous Lovenox added for DVT prophylaxis. Physical and occupational therapy evaluations completed. Occupational therapy has recommended physical medicine rehabilitation consult to consider inpatient rehabilitation services.   Review of Systems  Cardiovascular: Positive for chest pain.  Gastrointestinal: Positive for constipation.       GERD  Musculoskeletal: Positive for joint pain and myalgias.       Chronic back pain  Neurological: Positive for headaches.  Psychiatric/Behavioral: Positive for depression.       Anxiety  All other systems reviewed and are negative.   Past Medical History  Diagnosis Date  . Diabetes type 2, uncontrolled   . Crohn disease   . Multiple sclerosis 07/2012  . Pneumonia     "about 10 times in my lifetime" (04/17/2013)  . Anemia   . GERD (gastroesophageal reflux disease)   . Daily headache   . Migraine     "q day"  (04/17/2013)  . Epileptic     "had 1-2/night; started RX; last one was 01/2014"  . Chronic back pain     "all over" (04/17/2013)  . Anxiety   . Depression   . Arthritis     "joints" (04/17/2013)   Past Surgical History  Procedure Laterality Date  . Cholecystectomy  1994  . Tubal ligation  1993   Family History  Problem Relation Age of Onset  . Diabetes Mother   . Heart disease Father    Social History:  reports that Andrea Wade has been smoking Cigarettes.  Andrea Wade has a 10.5 pack-year smoking history. Andrea Wade has never used smokeless tobacco. Andrea Wade reports that Andrea Wade does not drink alcohol or use illicit drugs. Allergies:  Allergies  Allergen Reactions  . Codeine Hives, Itching and Palpitations  . Peanut-Containing Drug Products Anaphylaxis  . Penicillins Hives, Itching and Nausea And Vomiting   Medications Prior to Admission  Medication Sig Dispense Refill  . Acetaminophen (TYLENOL ARTHRITIS PAIN PO) Take 1 tablet by mouth every 8 (eight) hours as needed (for pain).      Marland Kitchen aspirin EC 81 MG tablet Take 81 mg by mouth daily.      Marland Kitchen glipiZIDE (GLUCOTROL) 5 MG tablet Take 5 mg by mouth 2 (two) times daily before a meal.      . levETIRAcetam (KEPPRA) 500 MG tablet Take 1 tablet (500 mg total) by mouth 2 (two) times daily.  60 tablet  0  . [DISCONTINUED] oxyCODONE-acetaminophen (PERCOCET/ROXICET) 5-325 MG per tablet Take 1 tablet by mouth every 6 (six) hours as needed for moderate pain or severe pain.      . metFORMIN (GLUCOPHAGE) 500 MG tablet Take  500 mg by mouth 2 (two) times daily with a meal.         Home: Home Living Family/patient expects to be discharged to:: Private residence Living Arrangements: Spouse/significant other (boyfriend) Available Help at Discharge: Family;Available PRN/intermittently (boyfriend drives trucks for a living) Type of Home: House Home Access: Ramped entrance Home Layout: One level Home Equipment: [place] - 2 wheels;Wheelchair - manual;Shower  seat;Bedside commode  Functional History: Prior Function Comments: Pt has toilet chair next to the bed to use while boyfriend is away at work.   (states Andrea Wade falls daily) Functional Status:  Mobility:     Ambulation/Gait Ambulation Distance (Feet): 15 Feet General Gait Details: pt unable to grip RW but able to push with open palms, narrow BOS, decreased step and stride length    ADL: ADL Eating/Feeding: Minimal assistance;Other (comment) (unable to cut up food. given built up tubing) Where Assessed - Eating/Feeding: Chair Grooming: Moderate assistance Where Assessed - Grooming: Unsupported sitting Upper Body Bathing: Moderate assistance Where Assessed - Upper Body Bathing: Unsupported sitting Lower Body Bathing: Maximal assistance Where Assessed - Lower Body Bathing: Supported sit to stand Upper Body Dressing: Maximal assistance Where Assessed - Upper Body Dressing: Unsupported sitting Lower Body Dressing: Maximal assistance Where Assessed - Lower Body Dressing: Supported sit to Lobbyist: Moderate assistance Toilet Transfer Method: Sit to stand;Stand pivot Equipment Used: Gait belt Transfers/Ambulation Related to ADLs: mod A ADL Comments: difficulty due to B hand weakness/decreased funciton  Cognition: Cognition Overall Cognitive Status: Within Functional Limits for tasks assessed Cognition Arousal/Alertness: Awake/alert Behavior During Therapy: WFL for tasks assessed/performed Overall Cognitive Status: Within Functional Limits for tasks assessed  Blood pressure 111/65, pulse 79, temperature 98 F (36.7 C), temperature source Oral, resp. rate 20, height 5' 8"  (1.727 m), weight 91.389 kg (201 lb 7.6 oz), last menstrual period 04/04/2013, SpO2 95.00%. Physical Exam  Vitals reviewed. Constitutional: Andrea Wade appears well-developed.  HENT:  Head: Normocephalic.  Eyes: EOM are normal.  No nystagmus  Neck: Normal range of motion. Neck supple. No thyromegaly present.   Cardiovascular: Normal rate and regular rhythm.   Respiratory: Effort normal and breath sounds normal. No respiratory distress.  GI: Soft. Bowel sounds are normal. Andrea Wade exhibits no distension.  Musculoskeletal:  Fingers with limited ROM. Had difficulty passively closing to make fist. Diffuse tenderness with Palpation of all limbs/girdles, etc. Crepitus with bending of either knee. Heel cords tight.  Neurological: Andrea Wade is alert.  Patient was very tearful and somewhat anxious. Mood was very flat. Andrea Wade was appropriate for conversation and followed basic commands. Limited ROM in all 4 limbs due to pain. Has antigravity strength in UE's. Cannot make a fist on either side. LE's 2/5 prox to 3- to 3/5 distally. Stocking glove sensory loss in wrist/hands and fee to mid calf. dtr's appear decreased. No resting tone  Skin: Skin is warm and dry.  Psychiatric:  anxious    Results for orders placed during the hospital encounter of 04/17/13 (from the past 24 hour(s))  HEPATITIS PANEL, ACUTE     Status: None   Collection Time    04/19/13  5:55 AM      Result Value Ref Range   Hepatitis B Surface Ag NEGATIVE  NEGATIVE   HCV Ab NEGATIVE  NEGATIVE   Hep A IgM NON REACTIVE  NON REACTIVE   Hep B C IgM NON REACTIVE  NON REACTIVE  GLUCOSE, CAPILLARY     Status: Abnormal   Collection Time    04/19/13  7:33 AM      Result Value Ref Range   Glucose-Capillary 174 (*) 70 - 99 mg/dL  GLUCOSE, CAPILLARY     Status: Abnormal   Collection Time    04/19/13 11:26 AM      Result Value Ref Range   Glucose-Capillary 198 (*) 70 - 99 mg/dL  GLUCOSE, CAPILLARY     Status: Abnormal   Collection Time    04/19/13  4:42 PM      Result Value Ref Range   Glucose-Capillary 161 (*) 70 - 99 mg/dL  GLUCOSE, CAPILLARY     Status: Abnormal   Collection Time    04/19/13  8:07 PM      Result Value Ref Range   Glucose-Capillary 166 (*) 70 - 99 mg/dL   Nm Myocar Multi W/spect W/wall Motion / Ef  04/18/2013   CLINICAL DATA:  Chest  pain  EXAM: Lexiscan Myovue  TECHNIQUE: The patient received IV Lexiscan .69m over 15 seconds. 33.0 mCi of Technetium 950mestamibi injected at 30 seconds. Quantitative SPECT images were obtained in the vertical, horizontal and short axis planes after a 45 minute delay. Rest images were obtained with similar planes and delay using 10.2 mCi of Technetium 9973mstamibi.  FINDINGS: ECG:  NSR, no ischemic changes with Lexiscan infusion.  Symptoms:  No reported  Quantitiative Gated SPECT EF:  60%, normal wall motion.  Perfusion Images: There was a small to medium-sized, moderate perfusion defect involving the apical anterior and apical septal wall segments that was noted both at rest and with Lexiscan stress.  IMPRESSION: 1.  Normal EF and wall motion.  2. Small to medium-sized fixed apical septal and apical anterior perfusion defect. Cannot rule out infarction, but given normal wall motion, suspect this represents soft tissue attenuation. No ischemia. Low risk study.  Dalton Mclean   Electronically Signed   By: DalLoralie ChampagneD.   On: 04/18/2013 14:27    Assessment/Plan: Diagnosis: gait disorder, diffuse pain, peripheral polyneuropathy 1. Does the need for close, 24 hr/day medical supervision in concert with the patient's rehab needs make it unreasonable for this patient to be served in a less intensive setting? Potentially 2. Co-Morbidities requiring supervision/potential complications: dm, sz, chest pain. ?ms 3. Due to bladder management, bowel management, safety, skin/wound care, disease management, medication administration, pain management and patient education, does the patient require 24 hr/day rehab nursing? Potentially 4. Does the patient require coordinated care of a physician, rehab nurse, PT (1-2 hrs/day, 5 days/week) and OT (1-2 hrs/day, 5 days/week) to address physical and functional deficits in the context of the above medical diagnosis(es)? Potentially Addressing deficits in the following  areas: balance, endurance, locomotion, strength, transferring, bowel/bladder control, bathing, dressing, feeding, grooming and toileting 5. Can the patient actively participate in an intensive therapy program of at least 3 hrs of therapy per day at least 5 days per week? Potentially 6. The potential for patient to make measurable gains while on inpatient rehab is good and fair 7. Anticipated functional outcomes upon discharge from inpatient rehab are ?mod I to supervision with PT, Mod I to min assist with OT, n/a with SLP. 8. Estimated rehab length of stay to reach the above functional goals is: TBD 9. Does the patient have adequate social supports to accommodate these discharge functional goals? Potentially 10. Anticipated D/C setting: Home 11. Anticipated post D/C treatments: HH [facility] 12. Overall Rehab/Functional Prognosis: TBD  RECOMMENDATIONS: This patient's condition is appropriate for continued rehabilitative care in the following setting:  TBD Patient has agreed to participate in recommended program. Potentially Note that insurance prior authorization may be required for reimbursement for recommended care.  Comment: Presentation not really consistent with MS. Has had some work up apparently in the past ([facility]?) and saw [facility] last summer. 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 Diffuse joint pain, loss of ROM is suspicious for rheumatological process. (Pt reports rheum work up in the past also. ?negative)--- Anxiety also an issue. ?Fibromyalgia.  Recommend further diagnostic work up for weakness, joint pain.  Will follow along.  Meredith Staggers, MD, [facility]     04/20/2013

## 2013-04-21 LAB — HIV-1 RNA, QUALITATIVE, TMA: HIV-1 RNA, Qualitative, TMA: NOT DETECTED

## 2013-04-24 NOTE — Discharge Summary (Signed)
Agree with plan as outlined in summary.

## 2013-04-26 LAB — HLA B*5701: HLA B 5701: NEGATIVE

## 2013-05-02 ENCOUNTER — Ambulatory Visit: Payer: Medicaid Other | Attending: Internal Medicine | Admitting: Internal Medicine

## 2013-05-02 VITALS — BP 134/88 | HR 103 | Temp 99.2°F | Resp 14 | Ht 68.0 in | Wt 201.0 lb

## 2013-05-02 DIAGNOSIS — K219 Gastro-esophageal reflux disease without esophagitis: Secondary | ICD-10-CM | POA: Insufficient documentation

## 2013-05-02 DIAGNOSIS — Z0000 Encounter for general adult medical examination without abnormal findings: Secondary | ICD-10-CM

## 2013-05-02 DIAGNOSIS — F411 Generalized anxiety disorder: Secondary | ICD-10-CM | POA: Insufficient documentation

## 2013-05-02 DIAGNOSIS — F329 Major depressive disorder, single episode, unspecified: Secondary | ICD-10-CM | POA: Insufficient documentation

## 2013-05-02 DIAGNOSIS — F17200 Nicotine dependence, unspecified, uncomplicated: Secondary | ICD-10-CM | POA: Insufficient documentation

## 2013-05-02 DIAGNOSIS — F3289 Other specified depressive episodes: Secondary | ICD-10-CM | POA: Insufficient documentation

## 2013-05-02 DIAGNOSIS — G40909 Epilepsy, unspecified, not intractable, without status epilepticus: Secondary | ICD-10-CM | POA: Insufficient documentation

## 2013-05-02 DIAGNOSIS — E119 Type 2 diabetes mellitus without complications: Secondary | ICD-10-CM | POA: Insufficient documentation

## 2013-05-02 DIAGNOSIS — K5090 Crohn's disease, unspecified, without complications: Secondary | ICD-10-CM | POA: Insufficient documentation

## 2013-05-02 DIAGNOSIS — G43909 Migraine, unspecified, not intractable, without status migrainosus: Secondary | ICD-10-CM | POA: Insufficient documentation

## 2013-05-02 LAB — POCT URINE PREGNANCY: Preg Test, Ur: NEGATIVE

## 2013-05-02 MED ORDER — GLIPIZIDE 5 MG PO TABS: 5.0000 mg | ORAL_TABLET | Freq: Two times a day (BID) | ORAL | Status: DC

## 2013-05-02 MED ORDER — PANTOPRAZOLE SODIUM 40 MG PO TBEC: 40.0000 mg | DELAYED_RELEASE_TABLET | Freq: Every day | ORAL | Status: DC

## 2013-05-02 MED ORDER — LORATADINE 10 MG PO TABS: 10.0000 mg | ORAL_TABLET | Freq: Every day | ORAL | Status: DC

## 2013-05-02 MED ORDER — METFORMIN HCL 500 MG PO TABS: 500.0000 mg | ORAL_TABLET | Freq: Two times a day (BID) | ORAL | Status: DC

## 2013-05-02 MED ORDER — LEVETIRACETAM 500 MG PO TABS: 500.0000 mg | ORAL_TABLET | Freq: Two times a day (BID) | ORAL | Status: DC

## 2013-05-02 MED ORDER — ASPIRIN EC 81 MG PO TBEC: 81.0000 mg | DELAYED_RELEASE_TABLET | Freq: Every day | ORAL | Status: DC

## 2013-05-02 MED ORDER — VENLAFAXINE HCL ER 37.5 MG PO CP24: 37.5000 mg | ORAL_CAPSULE | Freq: Every day | ORAL | Status: DC

## 2013-05-02 NOTE — Progress Notes (Signed)
Patient ID: Andrea Wade, female   DOB: Dec 22, 1970, 43 y.o.   MRN: [id number]   CC:  HPI: 43 year old female with diabetes, migraine, depression, anxiety, Crohn's disease, lumbar spine mass, comes in to establish care.. she stated she was hospitalized at [facility] with possible seizure. Was diagnosed with possible multiple sclerosis based on an MRI.  In July 2014 patient was admitted to the hospital for chest pain the event. During this time she had lumbar puncture to further evaluate for multiple sclerosis Also since July 2014 patient has been having constellation of other symptoms including diffuse joint pain, joint swelling, incontinence, weakness, memory loss, malaise. According to neurology the patient was suspected not to have multiple sclerosis, however they're yet to review outside records. [place] neurology did recommend to check,check EEG ,- check EMG/NCS. Today the patient complains of diffuse muscle spasms and holding all.   patient had a reactive HIV antibody test butconfirmatory tests were negative.  She states the pain decreased to 1/10 with dilaudid. She denies any heavy lifting or other strenuous activity. She has a h/o anxiety for which she used to take xanax but according to her the previous PCP will not prescribe this for her anymore. She is in the process of applying for disability and getting Medicaid approval.  She also claims that she has a history of Crohn's disease and complains of diarrhea almost every hour. She has not lost any weight though.  She was hospitalized between Date of Admission: 04/17/2013 , Date of Discharge: 04/23/2013 with atypical chest pain. The patient was seen by cardiology also had a Myoview, and serial troponins that were negative. Hemoglobin A1c was 8.8. She'll history of seizure disorder and is currently on Keppra. Patient has a history of tobacco abuse was provided and nicotine patch. She was found to be deconditioned and currently not  undergoing any physical therapy because of concerns about stability of her C-spine.  Social/family history There is a FH of MI in her mother at age 72 and her father in his early 6's. She smokes 1 pack every 2 days but denies any current alcohol or other recreational drug use    Allergies  Allergen Reactions  . Codeine Hives, Itching and Palpitations  . Peanut-Containing Drug Products Anaphylaxis  . Penicillins Hives, Itching and Nausea And Vomiting   Past Medical History  Diagnosis Date  . Diabetes type 2, uncontrolled   . Crohn disease   . Multiple sclerosis 07/2012  . Pneumonia     "about 10 times in my lifetime" (04/17/2013)  . Anemia   . GERD (gastroesophageal reflux disease)   . Daily headache   . Migraine     "q day" (04/17/2013)  . Epileptic     "had 1-2/night; started RX; last one was 01/2014"  . Chronic back pain     "all over" (04/17/2013)  . Anxiety   . Depression   . Arthritis     "joints" (04/17/2013)   Current Outpatient Prescriptions on File Prior to Visit  Medication Sig Dispense Refill  . Acetaminophen (TYLENOL ARTHRITIS PAIN PO) Take 1 tablet by mouth every 8 (eight) hours as needed (for pain).      . Multiple Vitamin (MULTIVITAMIN WITH MINERALS) TABS tablet Take 1 tablet by mouth daily.       No current facility-administered medications on file prior to visit.   Family History  Problem Relation Age of Onset  . Diabetes Mother   . Heart disease Father    History  Social History  . Marital Status: Single    Spouse Name: N/A    Number of Children: 2  . Years of Education: 11   Occupational History  . N/A    Social History Main Topics  . Smoking status: Current Every Day Smoker -- 0.50 packs/day for 21 years    Types: Cigarettes  . Smokeless tobacco: Never Used  . Alcohol Use: No  . Drug Use: No  . Sexual Activity: Not Currently    Partners: Male   Other Topics Concern  . Not on file   Social History Narrative  . No narrative on file     Review of Systems  Constitutional: Negative for fever, chills, diaphoresis, activity change, appetite change and fatigue.  HENT: Negative for ear pain, nosebleeds, congestion, facial swelling, rhinorrhea, neck pain, neck stiffness and ear discharge.   Eyes: Negative for pain, discharge, redness, itching and visual disturbance.  Respiratory: Negative for cough, choking, chest tightness, shortness of breath, wheezing and stridor.   Cardiovascular: Negative for chest pain, palpitations and leg swelling.  Gastrointestinal: Negative for abdominal distention.  Genitourinary: Negative for dysuria, urgency, frequency, hematuria, flank pain, decreased urine volume, difficulty urinating and dyspareunia.  Musculoskeletal: Negative for back pain, joint swelling, arthralgias and gait problem.  Neurological: Negative for dizziness, tremors, seizures, syncope, facial asymmetry, speech difficulty, weakness, light-headedness, numbness and headaches.  Hematological: Negative for adenopathy. Does not bruise/bleed easily.  Psychiatric/Behavioral: Negative for hallucinations, behavioral problems, confusion, dysphoric mood, decreased concentration and agitation.    Objective:   Filed Vitals:   05/02/13 1432  BP: 134/88  Pulse: 103  Temp: 99.2 F (37.3 C)  Resp: 14    Physical Exam  Constitutional: Appears well-developed and well-nourished. No distress.  HENT: Normocephalic. External right and left ear normal. Oropharynx is clear and moist.  Eyes: Conjunctivae and EOM are normal. PERRLA, no scleral icterus.  Neck: Normal ROM. Neck supple. No JVD. No tracheal deviation. No thyromegaly.  CVS: RRR, S1/S2 +, no murmurs, no gallops, no carotid bruit.  Pulmonary: Effort and breath sounds normal, no stridor, rhonchi, wheezes, rales.  Abdominal: Soft. BS +,  no distension, tenderness, rebound or guarding.  Musculoskeletal: Normal range of motion. No edema and no tenderness.  Lymphadenopathy: No  lymphadenopathy noted, cervical, inguinal. Neuro: Alert. Normal reflexes, muscle tone coordination. No cranial nerve deficit. Skin: Skin is warm and dry. No rash noted. Not diaphoretic. No erythema. No pallor.  Psychiatric: Normal mood and affect. Behavior, judgment, thought content normal.   Lab Results  Component Value Date   WBC 9.5 04/18/2013   HGB 10.6* 04/18/2013   HCT 32.4* 04/18/2013   MCV 73.1* 04/18/2013   PLT 397 04/18/2013   Lab Results  Component Value Date   CREATININE 0.44* 04/18/2013   BUN 4* 04/18/2013   NA 134* 04/18/2013   K 4.1 04/18/2013   CL 96 04/18/2013   CO2 25 04/18/2013    Lab Results  Component Value Date   HGBA1C 8.6* 04/17/2013   Lipid Panel     Component Value Date/Time   CHOL 117 04/18/2013 0750   TRIG 193* 04/18/2013 0750   HDL 22* 04/18/2013 0750   CHOLHDL 5.3 04/18/2013 0750   VLDL 39 04/18/2013 0750   LDLCALC 56 04/18/2013 0750       Assessment and plan:   Patient Active Problem List   Diagnosis Date Noted  . Chest pain 04/17/2013  . Seizure 02/24/2013  . Atypical chest pain 09/22/2012  . Multiple sclerosis 09/22/2012  .  Noncompliance 09/22/2012  . Tobacco abuse 09/22/2012  . DM2 (diabetes mellitus, type 2) 09/22/2012  . Protein-calorie malnutrition, severe 09/22/2012   Establish care GI referral for possibility of Crohn's disease-we'll check ESR  Gynecology referral for a Pap smear Schedule the patient for a mammogram  Refusing flu vaccination and tetanus vaccination   Suspected multiple sclerosis neurology referral provided  Seizures - Pt denies any episodes of seizure activity since on keppra.  -continued keppra 537m bid    vitamin D deficiency We'll recheck levels today   Patient to followup in 2 months   The patient was given clear instructions to go to ER or return to medical center if symptoms don't improve, worsen or new problems develop. The patient verbalized understanding. The patient was told to call to get any  lab results if not heard anything in the next week.

## 2013-05-03 ENCOUNTER — Telehealth: Payer: Self-pay | Admitting: Diagnostic Neuroimaging

## 2013-05-03 LAB — SEDIMENTATION RATE: Sed Rate: 49 mm/hr — ABNORMAL HIGH (ref 0–22)

## 2013-05-03 LAB — VITAMIN D 25 HYDROXY (VIT D DEFICIENCY, FRACTURES): Vit D, 25-Hydroxy: 26 ng/mL — ABNORMAL LOW (ref 30–89)

## 2013-05-04 ENCOUNTER — Telehealth: Payer: Self-pay | Admitting: *Deleted

## 2013-05-04 NOTE — Telephone Encounter (Signed)
Contacted patient to notify her of lab results. Instructed patient to pick up Vitamin D at any pharmacy and take a tablet BID. Patient understood and agreed. Call completed as followed.

## 2013-05-04 NOTE — Telephone Encounter (Signed)
Message copied by Deshayla Empson, [place] R on Thu May 04, 2013  3:28 PM ------      Message from: Allyson Sabal MD, [facility]      Created: Wed May 03, 2013 10:47 AM       Notify patient of the patient's vitamin D level was mildly low, patient advised to start taking vitamin D 2000 international units over-the-counter twice a day ------

## 2013-05-10 ENCOUNTER — Telehealth: Payer: Self-pay | Admitting: Diagnostic Neuroimaging

## 2013-05-10 NOTE — Telephone Encounter (Signed)
done

## 2013-05-10 NOTE — Telephone Encounter (Signed)
Called and spoke with patient,sched and confirmed appointment

## 2013-05-15 ENCOUNTER — Other Ambulatory Visit: Payer: Self-pay | Admitting: Internal Medicine

## 2013-05-15 DIAGNOSIS — Z1231 Encounter for screening mammogram for malignant neoplasm of breast: Secondary | ICD-10-CM

## 2013-05-19 ENCOUNTER — Ambulatory Visit: Payer: Self-pay

## 2013-06-01 ENCOUNTER — Ambulatory Visit: Payer: Self-pay | Admitting: Nurse Practitioner

## 2013-06-02 ENCOUNTER — Ambulatory Visit: Payer: Self-pay

## 2013-06-12 ENCOUNTER — Encounter: Payer: Self-pay | Admitting: Internal Medicine

## 2013-06-13 ENCOUNTER — Telehealth: Payer: Self-pay | Admitting: Nurse Practitioner

## 2013-06-13 NOTE — Telephone Encounter (Signed)
Dr p patient

## 2013-06-13 NOTE — Telephone Encounter (Signed)
Offer follow up appt with me or lynn in next few days. -VRP

## 2013-06-13 NOTE — Telephone Encounter (Signed)
Pt states the pain is shooting up into her head and ears she also feels like her jaw is being crushed together. I R/s her appt  Not that her Medicaid has come through,for the first avail in which was may but she wants a call back because she states the pain is unbearable.

## 2013-06-13 NOTE — Telephone Encounter (Signed)
Patient states the pain is shooting up in her head and ears, feels like pressure,  like her eyeballs are going to pop out for the past 3-4 days, not getting any better, not sleeping.  Patient also states her jaw bones are also pushing up and when she eats feels like they are slipping which also started in the last 3-4 days.  Please advise.

## 2013-06-13 NOTE — Telephone Encounter (Signed)
Message sent to Dr. Leta Baptist, please advise.

## 2013-06-14 NOTE — Telephone Encounter (Signed)
Pt called to see if any medications was called in I advised her of Dr. Gladstone Lighter note and scheduled her for tomorrow where there was a cancellation. Thanks

## 2013-06-15 ENCOUNTER — Ambulatory Visit (INDEPENDENT_AMBULATORY_CARE_PROVIDER_SITE_OTHER): Payer: Medicaid Other | Admitting: Diagnostic Neuroimaging

## 2013-06-15 ENCOUNTER — Encounter: Payer: Self-pay | Admitting: Diagnostic Neuroimaging

## 2013-06-15 ENCOUNTER — Telehealth: Payer: Self-pay | Admitting: Internal Medicine

## 2013-06-15 VITALS — BP 141/91 | HR 88 | Ht 68.0 in | Wt 204.0 lb

## 2013-06-15 DIAGNOSIS — R531 Weakness: Secondary | ICD-10-CM

## 2013-06-15 DIAGNOSIS — M79609 Pain in unspecified limb: Secondary | ICD-10-CM

## 2013-06-15 DIAGNOSIS — R569 Unspecified convulsions: Secondary | ICD-10-CM

## 2013-06-15 DIAGNOSIS — R5383 Other fatigue: Secondary | ICD-10-CM

## 2013-06-15 DIAGNOSIS — R930 Abnormal findings on diagnostic imaging of skull and head, not elsewhere classified: Secondary | ICD-10-CM

## 2013-06-15 DIAGNOSIS — R9089 Other abnormal findings on diagnostic imaging of central nervous system: Secondary | ICD-10-CM

## 2013-06-15 DIAGNOSIS — R5381 Other malaise: Secondary | ICD-10-CM

## 2013-06-15 DIAGNOSIS — M62838 Other muscle spasm: Secondary | ICD-10-CM

## 2013-06-15 NOTE — Telephone Encounter (Signed)
Pt calling and would like to speak to nurse. Please f/u with pt.

## 2013-06-15 NOTE — Progress Notes (Signed)
[facility]  PATIENT: Andrea Wade DOB: [birth year]  REFERRING CLINICIAN:  HISTORY FROM: patient, fiance, daughter, grandson REASON FOR VISIT: follow up   HISTORICAL  CHIEF COMPLAINT:  Chief Complaint  Patient presents with  . Follow-up    HISTORY OF PRESENT ILLNESS:   UPDATE 06/15/13: Since last visit patient continues to have constellation of symptoms including diffuse pain, neck pain, arm, leg, joint pain, muscle spasms in legs. Patient also had recent hospitalization for chest pain and malaise evaluation. Patient is taking Tylenol as needed for pain control. She states that her depression anxiety or not under good control. She is awaiting referral to psychiatry. She tells me that her Medicaid has just been approved. No seizure since last visit.  PRIOR HPI (11/08/12): 43 year old female with diabetes, migraine, depression, anxiety, Crohn's disease, lumbar spine mass, or for evaluation of multiple sclerosis.   In June 2014 patient passed out at home patient went to [facility] and was diagnosed with possible seizure. Also patient was diagnosed with possible multiple sclerosis based on MRI. However she did not followup neurology.  July patient was admitted to the hospital for chest pain the event. During this time she had lumbar puncture to further evaluate for multiple sclerosis. Patient presents to this office visit for further evaluation and followup of test results.  Also since July 2014 patient has been having constellation of other symptoms including diffuse joint pain, joint swelling, incontinence, weakness, memory loss, malaise.   REVIEW OF SYSTEMS: Full 14 system review of systems performed and notable only as per history of present illness otherwise negative.  ALLERGIES: Allergies  Allergen Reactions  . Codeine Hives, Itching and Palpitations  . Peanut-Containing Drug Products Anaphylaxis  . Penicillins Hives, Itching and Nausea And Vomiting     HOME MEDICATIONS: Outpatient Prescriptions Prior to Visit  Medication Sig Dispense Refill  . Acetaminophen (TYLENOL ARTHRITIS PAIN PO) Take 1 tablet by mouth every 8 (eight) hours as needed (for pain).      Marland Kitchen aspirin EC 81 MG tablet Take 1 tablet (81 mg total) by mouth daily.  60 tablet  2  . glipiZIDE (GLUCOTROL) 5 MG tablet Take 1 tablet (5 mg total) by mouth 2 (two) times daily before a meal.  60 tablet  3  . levETIRAcetam (KEPPRA) 500 MG tablet Take 1 tablet (500 mg total) by mouth 2 (two) times daily.  60 tablet  5  . metFORMIN (GLUCOPHAGE) 500 MG tablet Take 1 tablet (500 mg total) by mouth 2 (two) times daily with a meal.  60 tablet  11  . pantoprazole (PROTONIX) 40 MG tablet Take 1 tablet (40 mg total) by mouth daily.  30 tablet  3  . loratadine (CLARITIN) 10 MG tablet Take 1 tablet (10 mg total) by mouth daily.  30 tablet  6  . Multiple Vitamin (MULTIVITAMIN WITH MINERALS) TABS tablet Take 1 tablet by mouth daily.      Marland Kitchen venlafaxine XR (EFFEXOR-XR) 37.5 MG 24 hr capsule Take 1 capsule (37.5 mg total) by mouth daily with breakfast.  30 capsule  2   No facility-administered medications prior to visit.    PAST MEDICAL HISTORY: Past Medical History  Diagnosis Date  . Diabetes type 2, uncontrolled   . Crohn disease   . Multiple sclerosis 07/2012  . Pneumonia     "about 10 times in my lifetime" (04/17/2013)  . Anemia   . GERD (gastroesophageal reflux disease)   . Daily headache   . Migraine     "  q day" (04/17/2013)  . Epileptic     "had 1-2/night; started RX; last one was 01/2014"  . Chronic back pain     "all over" (04/17/2013)  . Anxiety   . Depression   . Arthritis     "joints" (04/17/2013)    PAST SURGICAL HISTORY: Past Surgical History  Procedure Laterality Date  . Cholecystectomy  1994  . Tubal ligation  1993    FAMILY HISTORY: Family History  Problem Relation Age of Onset  . Diabetes Mother   . Heart disease Father     SOCIAL HISTORY:  History    Social History  . Marital Status: Single    Spouse Name: N/A    Number of Children: 2  . Years of Education: 11   Occupational History  . N/A    Social History Main Topics  . Smoking status: Current Every Day Smoker -- 0.50 packs/day for 21 years    Types: Cigarettes  . Smokeless tobacco: Never Used  . Alcohol Use: No  . Drug Use: No  . Sexual Activity: Not Currently    Partners: Male   Other Topics Concern  . Not on file   Social History Narrative   Patient lives at home with boyfriend.   Caffeine Use: 1/2 gallon tea daily     PHYSICAL EXAM  Filed Vitals:   06/15/13 1349  BP: 141/91  Pulse: 88  Height: 5' 8"  (1.727 m)  Weight: 204 lb (92.534 kg)    Not recorded    Body mass index is 31.03 kg/(m^2).  GENERAL EXAM:  IN WHEELCHAIR. SLOW RESPONSES. ILL APPEARING. FLAT AFFECT. EDENTULOUS.  CARDIOVASCULAR:  Regular rate and rhythm, no murmurs, no carotid bruits   NEUROLOGIC:  MENTAL STATUS: awake, alert, language fluent, comprehension intact, naming intact  CRANIAL NERVE: no papilledema on fundoscopic exam, pupils equal and reactive to light, visual fields full to confrontation, extraocular muscles intact, no nystagmus, facial sensation and strength symmetric, uvula midline, shoulder shrug symmetric, tongue midline.  MOTOR: DIFFUSE WEAKNESS (3/5). LIMITED BY PAIN IN HANDS, FINGERS, LEGS.  SENSORY: ABSENT VIB AT TOES.  COORDINATION: finger-nose-finger, fine finger movements normal  REFLEXES: BUE 2, KNEES 1, ANKLES 0.  GAIT/STATION: IN WHEELCHAIR. CANNOT STAND UNASSISTED.     DIAGNOSTIC DATA (LABS, IMAGING, TESTING) - I reviewed patient records, labs, notes, testing and imaging myself where available.  Lab Results  Component Value Date   WBC 9.5 04/18/2013   HGB 10.6* 04/18/2013   HCT 32.4* 04/18/2013   MCV 73.1* 04/18/2013   PLT 397 04/18/2013      Component Value Date/Time   NA 134* 04/18/2013 0750   K 4.1 04/18/2013 0750   CL 96 04/18/2013 0750   CO2  25 04/18/2013 0750   GLUCOSE 199* 04/18/2013 0750   BUN 4* 04/18/2013 0750   CREATININE 0.44* 04/18/2013 0750   CALCIUM 9.1 04/18/2013 0750   PROT 8.4* 04/17/2013 0628   ALBUMIN 2.9* 04/17/2013 0628   AST 15 04/17/2013 0628   ALT 12 04/17/2013 0628   ALKPHOS 95 04/17/2013 0628   BILITOT <0.2* 04/17/2013 0628   GFRNONAA >90 04/18/2013 0750   GFRAA >90 04/18/2013 0750   Lab Results  Component Value Date   CHOL 117 04/18/2013   HDL 22* 04/18/2013   LDLCALC 56 04/18/2013   TRIG 193* 04/18/2013   CHOLHDL 5.3 04/18/2013   Lab Results  Component Value Date   HGBA1C 8.6* 04/17/2013   Lab Results  Component Value Date   UYQIHKVQ25 418 09/22/2012  Lab Results  Component Value Date   TSH 1.669 04/17/2013    I reviewed images myself. The brain findings are mild, and not clearly typical for demyelinating disease. Patient's significant tobacco and diabetes history would favor chronic small vessel ischemic disease as the better explanation for the white matter lesions. -VRP  08/24/12 MRI brain  No evidence of acute infarction. Foci of abnormal FLAIR and T2 signal within the cerebral hemispheric white matter. With this pattern and appearance, and consideration of the patient's age, the favored diagnosis is demyelinating disease/multiple sclerosis. Differential diagnosis does include small vessel disease.  08/24/12 MRI cervical 1. Mild multifactorial cervical spinal stenosis at C6-C7 with a broad-based component of disc eccentric to the left and may effect the exiting left C7 nerve. No definite spinal cord mass effect and no spinal cord signal abnormality. 2. Mild cervical disc degeneration elsewhere. There is multifactorial mild to moderate bilateral C5 foraminal stenosis.  3. Moderate left T2 foraminal stenosis related to uncovertebral and facet hypertrophy.  09/23/12 CSF - WBC 1, RBC 10, PROTEIN 28, GLUCOSE 99, OCB NEGATIVE   04/17/13 - 04/19/13 Labs: HIV ab reactive, HIV-1/2 ab negative, HIV RNA qual TMA  negative, HIV RNA ultraquant negative    ASSESSMENT AND PLAN  43 y.o. year old female here with diffuse constellation of symptoms including pain, joint swelling, muscle spasm, headaches, history of seizures, depression, anxiety, insomnia. No clear unifying diagnosis. Patient continues to have poor glycemic control and continues to smoke cigarettes. She appears significantly deconditioned. At this point I do not think patient has multiple sclerosis based on minimal MRI brain findings and negative lumbar puncture.  However given her severe symptoms, I will check further imaging of her neuraxis, with EEG and EMG studies to see if there is any underlying primary neurologic process.  PLAN: - additional studies (eval for vascular, autoimmune, infx, neoplastic processes) - defer to PCP re: rheumatology or pain mgmt eval  Orders Placed This Encounter  Procedures  . MR Brain W Wo Contrast  . MR Cervical Spine W Wo Contrast  . MR Thoracic Spine W Wo Contrast  . MR Lumbar Spine W Wo Contrast  . EEG adult  . NCV with EMG(electromyography)   Return in about 3 months (around 09/14/2013).    Penni Bombard, MD 1/50/4136, 4:38 PM Certified in Neurology, Neurophysiology and Neuroimaging  [facility] [address], [place], [place] [address] [phone]

## 2013-06-16 ENCOUNTER — Telehealth: Payer: Self-pay | Admitting: Emergency Medicine

## 2013-06-16 NOTE — Telephone Encounter (Signed)
Pt called in requesting pain management after Neurology referral. States she was told to call PCP for pain meds. Spoke with Dr. Allyson Sabal, pt informed to wait until imaging complete in which we will address pain needs

## 2013-06-17 ENCOUNTER — Emergency Department (HOSPITAL_COMMUNITY): Payer: Medicaid Other

## 2013-06-17 ENCOUNTER — Emergency Department (HOSPITAL_COMMUNITY)
Admission: EM | Admit: 2013-06-17 | Discharge: 2013-06-17 | Disposition: A | Discharge status: Home / Self Care | Payer: Medicaid Other | Attending: Emergency Medicine | Admitting: Emergency Medicine

## 2013-06-17 ENCOUNTER — Encounter (HOSPITAL_COMMUNITY): Payer: Self-pay | Admitting: Emergency Medicine

## 2013-06-17 DIAGNOSIS — K219 Gastro-esophageal reflux disease without esophagitis: Secondary | ICD-10-CM | POA: Insufficient documentation

## 2013-06-17 DIAGNOSIS — M62838 Other muscle spasm: Secondary | ICD-10-CM | POA: Insufficient documentation

## 2013-06-17 DIAGNOSIS — Z79899 Other long term (current) drug therapy: Secondary | ICD-10-CM | POA: Insufficient documentation

## 2013-06-17 DIAGNOSIS — Z862 Personal history of diseases of the blood and blood-forming organs and certain disorders involving the immune mechanism: Secondary | ICD-10-CM | POA: Insufficient documentation

## 2013-06-17 DIAGNOSIS — F17200 Nicotine dependence, unspecified, uncomplicated: Secondary | ICD-10-CM | POA: Insufficient documentation

## 2013-06-17 DIAGNOSIS — IMO0001 Reserved for inherently not codable concepts without codable children: Secondary | ICD-10-CM | POA: Insufficient documentation

## 2013-06-17 DIAGNOSIS — Z8701 Personal history of pneumonia (recurrent): Secondary | ICD-10-CM | POA: Insufficient documentation

## 2013-06-17 DIAGNOSIS — R112 Nausea with vomiting, unspecified: Secondary | ICD-10-CM | POA: Insufficient documentation

## 2013-06-17 DIAGNOSIS — E1165 Type 2 diabetes mellitus with hyperglycemia: Secondary | ICD-10-CM

## 2013-06-17 DIAGNOSIS — G8929 Other chronic pain: Secondary | ICD-10-CM | POA: Insufficient documentation

## 2013-06-17 DIAGNOSIS — M129 Arthropathy, unspecified: Secondary | ICD-10-CM | POA: Insufficient documentation

## 2013-06-17 DIAGNOSIS — Z3202 Encounter for pregnancy test, result negative: Secondary | ICD-10-CM | POA: Insufficient documentation

## 2013-06-17 DIAGNOSIS — G40909 Epilepsy, unspecified, not intractable, without status epilepticus: Secondary | ICD-10-CM | POA: Insufficient documentation

## 2013-06-17 DIAGNOSIS — Z880 Allergy status to penicillin: Secondary | ICD-10-CM | POA: Insufficient documentation

## 2013-06-17 DIAGNOSIS — Z8659 Personal history of other mental and behavioral disorders: Secondary | ICD-10-CM | POA: Insufficient documentation

## 2013-06-17 LAB — CBC
HEMATOCRIT: 34.3 % — AB (ref 36.0–46.0)
Hemoglobin: 11.1 g/dL — ABNORMAL LOW (ref 12.0–15.0)
MCH: 24 pg — ABNORMAL LOW (ref 26.0–34.0)
MCHC: 32.4 g/dL (ref 30.0–36.0)
MCV: 74.2 fL — AB (ref 78.0–100.0)
Platelets: 383 10*3/uL (ref 150–400)
RBC: 4.62 MIL/uL (ref 3.87–5.11)
RDW: 16.9 % — AB (ref 11.5–15.5)
WBC: 9.1 10*3/uL (ref 4.0–10.5)

## 2013-06-17 LAB — URINALYSIS, ROUTINE W REFLEX MICROSCOPIC
Bilirubin Urine: NEGATIVE
GLUCOSE, UA: NEGATIVE mg/dL
HGB URINE DIPSTICK: NEGATIVE
KETONES UR: NEGATIVE mg/dL
Nitrite: NEGATIVE
Protein, ur: NEGATIVE mg/dL
Specific Gravity, Urine: 1.009 (ref 1.005–1.030)
Urobilinogen, UA: 0.2 mg/dL (ref 0.0–1.0)
pH: 6 (ref 5.0–8.0)

## 2013-06-17 LAB — COMPREHENSIVE METABOLIC PANEL
ALBUMIN: 3.1 g/dL — AB (ref 3.5–5.2)
ALK PHOS: 66 U/L (ref 39–117)
ALT: 7 U/L (ref 0–35)
AST: 13 U/L (ref 0–37)
BUN: 4 mg/dL — ABNORMAL LOW (ref 6–23)
CHLORIDE: 103 meq/L (ref 96–112)
CO2: 21 meq/L (ref 19–32)
Calcium: 8.9 mg/dL (ref 8.4–10.5)
Creatinine, Ser: 0.42 mg/dL — ABNORMAL LOW (ref 0.50–1.10)
GFR calc Af Amer: >90 mL/min (ref >90)
Glucose, Bld: 187 mg/dL — ABNORMAL HIGH (ref 70–99)
Potassium: 4 mEq/L (ref 3.7–5.3)
SODIUM: 139 meq/L (ref 137–147)
Total Bilirubin: 0.2 mg/dL — ABNORMAL LOW (ref 0.3–1.2)
Total Protein: 7.9 g/dL (ref 6.0–8.3)

## 2013-06-17 LAB — I-STAT TROPONIN, ED: Troponin i, poc: 0 ng/mL (ref 0.00–0.08)

## 2013-06-17 LAB — URINE MICROSCOPIC-ADD ON

## 2013-06-17 LAB — POC URINE PREG, ED: PREG TEST UR: NEGATIVE

## 2013-06-17 LAB — LIPASE, BLOOD: Lipase: 33 U/L (ref 11–59)

## 2013-06-17 MED ORDER — DIAZEPAM 5 MG/ML IJ SOLN
5.0000 mg | Freq: Once | INTRAMUSCULAR | Status: AC
  Administered 2013-06-17: 5 mg via INTRAVENOUS
  Filled 2013-06-17: qty 2

## 2013-06-17 MED ORDER — DIAZEPAM 5 MG PO TABS: 5.0000 mg | ORAL_TABLET | Freq: Four times a day (QID) | ORAL | Status: DC | PRN

## 2013-06-17 NOTE — ED Notes (Signed)
MD at bedside. 

## 2013-06-17 NOTE — ED Notes (Signed)
Pt taken to xray 

## 2013-06-17 NOTE — Discharge Instructions (Signed)
°  Muscle Cramps and Spasms Muscle cramps and spasms occur when a muscle or muscles tighten and you have no control over this tightening (involuntary muscle contraction). They are a common problem and can develop in any muscle. The most common place is in the calf muscles of the leg. Both muscle cramps and muscle spasms are involuntary muscle contractions, but they also have differences:   Muscle cramps are sporadic and painful. They may last a few seconds to a quarter of an hour. Muscle cramps are often more forceful and last longer than muscle spasms.  Muscle spasms may or may not be painful. They may also last just a few seconds or much longer. CAUSES  It is uncommon for cramps or spasms to be due to a serious underlying problem. In many cases, the cause of cramps or spasms is unknown. Some common causes are:   Overexertion.   Overuse from repetitive motions (doing the same thing over and over).   Remaining in a certain position for a long period of time.   Improper preparation, form, or technique while performing a sport or activity.   Dehydration.   Injury.   Side effects of some medicines.   Abnormally low levels of the salts and ions in your blood (electrolytes), especially potassium and calcium. This could happen if you are taking water pills (diuretics) or you are pregnant.  Some underlying medical problems can make it more likely to develop cramps or spasms. These include, but are not limited to:   Diabetes.   Parkinson disease.   Hormone disorders, such as thyroid problems.   Alcohol abuse.   Diseases specific to muscles, joints, and bones.   Blood vessel disease where not enough blood is getting to the muscles.  HOME CARE INSTRUCTIONS   Stay well hydrated. Drink enough water and fluids to keep your urine clear or pale yellow.  It may be helpful to massage, stretch, and relax the affected muscle.  For tight or tense muscles, use a warm towel, heating  pad, or hot shower water directed to the affected area.  If you are sore or have pain after a cramp or spasm, applying ice to the affected area may relieve discomfort.  Put ice in a plastic bag.  Place a towel between your skin and the bag.  Leave the ice on for 15-20 minutes, 03-04 times a day.  Medicines used to treat a known cause of cramps or spasms may help reduce their frequency or severity. Only take over-the-counter or prescription medicines as directed by your caregiver. SEEK MEDICAL CARE IF:  Your cramps or spasms get more severe, more frequent, or do not improve over time.  MAKE SURE YOU:   Understand these instructions.  Will watch your condition.  Will get help right away if you are not doing well or get worse. Document Released: 08/08/2001 Document Revised: 06/13/2012 Document Reviewed: 02/03/2012 [facility] Patient Information 2014 [place], [place].

## 2013-06-17 NOTE — ED Provider Notes (Signed)
CSN: [id number]     Arrival date & time 06/17/13  0146 History   First MD Initiated Contact with Patient 06/17/13 0408     Chief Complaint  Patient presents with  . Emesis  . Chest Pain     (Consider location/radiation/quality/duration/timing/severity/associated sxs/prior Treatment) HPI Patient is an unfortunate type II diabetic 43 yo woman with Crohn's disease and multiple sclerosis. She presents after developing RUQ abdominal pain which radiates to the midline. She has been nauseated and vomited NBNB emesis x 2 PTA.   Patient says she felt pain in the right neck while vomiting and has had intense pain in the right lateral neck since then. She has had associated worsening of chronic weakness of the RUE. The patient says she has chronic weakness of all 4 extremities as a result of MS and ambulates with a walker.   Interestingly, she is followed by [facility] but is not on any medication for MS. She says her abdominal pain has resolved. She is s/p cholecystectomy, remotely. NO fever. The patient has had 3 episodes of soft stools today and says that her stools are of usual caliber. No fever. No GU sx.   Past Medical History  Diagnosis Date  . Diabetes type 2, uncontrolled   . Crohn disease   . Multiple sclerosis 07/2012  . Pneumonia     "about 10 times in my lifetime" (04/17/2013)  . Anemia   . GERD (gastroesophageal reflux disease)   . Daily headache   . Migraine     "q day" (04/17/2013)  . Epileptic     "had 1-2/night; started RX; last one was 01/2014"  . Chronic back pain     "all over" (04/17/2013)  . Anxiety   . Depression   . Arthritis     "joints" (04/17/2013)   Past Surgical History  Procedure Laterality Date  . Cholecystectomy  1994  . Tubal ligation  1993   Family History  Problem Relation Age of Onset  . Diabetes Mother   . Heart disease Father    History  Substance Use Topics  . Smoking status: Current Every Day Smoker -- 0.50 packs/day for 21 years    Types:  Cigarettes  . Smokeless tobacco: Never Used  . Alcohol Use: No   OB History   Grav Para Term Preterm Abortions TAB SAB Ect Mult Living                 Review of Systems Ten point review of symptoms performed and is negative with the exception of symptoms noted and chronic abdominal pain.      Allergies  Codeine; Peanut-containing drug products; and Penicillins  Home Medications   Prior to Admission medications   Medication Sig Start Date End Date Taking? Authorizing Provider  Acetaminophen (TYLENOL ARTHRITIS PAIN PO) Take 1 tablet by mouth every 8 (eight) hours as needed (for pain).   Yes Historical Provider, MD  aspirin EC 81 MG tablet Take 1 tablet (81 mg total) by mouth daily. 05/02/13  Yes Reyne Dumas, MD  glipiZIDE (GLUCOTROL) 5 MG tablet Take 1 tablet (5 mg total) by mouth 2 (two) times daily before a meal. 05/02/13  Yes Reyne Dumas, MD  levETIRAcetam (KEPPRA) 500 MG tablet Take 1 tablet (500 mg total) by mouth 2 (two) times daily. 05/02/13  Yes Reyne Dumas, MD  loratadine (CLARITIN) 10 MG tablet Take 1 tablet (10 mg total) by mouth daily. 05/02/13  Yes Reyne Dumas, MD  metFORMIN (GLUCOPHAGE) 500 MG tablet Take  1 tablet (500 mg total) by mouth 2 (two) times daily with a meal. 05/02/13  Yes Reyne Dumas, MD  pantoprazole (PROTONIX) 40 MG tablet Take 1 tablet (40 mg total) by mouth daily. 05/02/13  Yes Reyne Dumas, MD   BP 126/74  Pulse 80  Temp(Src) 98.4 F (36.9 C) (Oral)  Resp 13  Ht 5' 8"  (1.727 m)  Wt 203 lb (92.08 kg)  BMI 30.87 kg/m2  SpO2 98%  LMP 05/28/2013 Physical Exam  Gen: well developed and well nourished appearing Head: NCAT Eyes: PERL, EOMI Nose: no epistaixis or rhinorrhea Mouth/throat: mucosa is moist and pink Neck: supple, no stridor, no midline ttp, ttp with palpable muscle tension/spasm over the body of the right trapezius m. With reproduction of pain.  Lungs: CTA B, no wheezing, rhonchi or rales CV: RRR, no murmur, extremities appear well perfused.   Abd: soft, notender, nondistended Back: no ttp, no cva ttp Skin: warm and dry Ext: normal to inspection, no dependent edema Neuro: CN ii-xii grossly intact, good muscle tone all 4 ext. Able to grip with both hands, patient resists attempts to range right arm because she says it makes her neck pain worse.  Psyche; flat affect,  calm and cooperative.   ED Course  Procedures (including critical care time) Labs Review  Results for orders placed during the hospital encounter of 06/17/13 (from the past 24 hour(s))  CBC     Status: Abnormal   Collection Time    06/17/13  2:40 AM      Result Value Ref Range   WBC 9.1  4.0 - 10.5 K/uL   RBC 4.62  3.87 - 5.11 MIL/uL   Hemoglobin 11.1 (*) 12.0 - 15.0 g/dL   HCT 34.3 (*) 36.0 - 46.0 %   MCV 74.2 (*) 78.0 - 100.0 fL   MCH 24.0 (*) 26.0 - 34.0 pg   MCHC 32.4  30.0 - 36.0 g/dL   RDW 16.9 (*) 11.5 - 15.5 %   Platelets 383  150 - 400 K/uL  LIPASE, BLOOD     Status: None   Collection Time    06/17/13  2:40 AM      Result Value Ref Range   Lipase 33  11 - 59 U/L  COMPREHENSIVE METABOLIC PANEL     Status: Abnormal   Collection Time    06/17/13  2:40 AM      Result Value Ref Range   Sodium 139  137 - 147 mEq/L   Potassium 4.0  3.7 - 5.3 mEq/L   Chloride 103  96 - 112 mEq/L   CO2 21  19 - 32 mEq/L   Glucose, Bld 187 (*) 70 - 99 mg/dL   BUN 4 (*) 6 - 23 mg/dL   Creatinine, Ser 0.42 (*) 0.50 - 1.10 mg/dL   Calcium 8.9  8.4 - 10.5 mg/dL   Total Protein 7.9  6.0 - 8.3 g/dL   Albumin 3.1 (*) 3.5 - 5.2 g/dL   AST 13  0 - 37 U/L   ALT 7  0 - 35 U/L   Alkaline Phosphatase 66  39 - 117 U/L   Total Bilirubin 0.2 (*) 0.3 - 1.2 mg/dL   GFR calc non Af Amer >90  >90 mL/min   GFR calc Af Amer >90  >90 mL/min  I-STAT TROPOININ, ED     Status: None   Collection Time    06/17/13  2:44 AM      Result Value Ref Range  Troponin i, poc 0.00  0.00 - 0.08 ng/mL   Comment 3             Imaging Review Dg Chest 2 View  06/17/2013   CLINICAL DATA:   Nausea and vomiting since 6 p.m. tonight. Diarrhea. History of Crohn's disease. Recent diagnosis of MS.  EXAM: CHEST  2 VIEW  COMPARISON:  DG CHEST 2 VIEW dated 04/17/2013; CT ANGIO CHEST W/CM &/OR WO/CM dated 04/17/2013  FINDINGS: The heart size and mediastinal contours are within normal limits. Both lungs are clear. The visualized skeletal structures are unremarkable.  IMPRESSION: No active cardiopulmonary disease.   Electronically Signed   By: Lucienne Capers M.D.   On: 06/17/2013 02:59    MDM    Patient feeling better after tx of Valium for right trapezius m. Spasm. She is now able to move her right arm and says she is feeling better. She is stable for discharge with plan for outpatient follow up.    Elyn Peers, MD 06/17/13 [phone]

## 2013-06-17 NOTE — ED Notes (Signed)
Per EMS: pt from home with c/o nausea and vomiting since 6pm tonight.  Pt reports about 3-4 incidences of vomiting.  Pt also sts she has had some diarrhea; pt with hx of Crohns and sts her diarrhea is constant.  Pt was recently diagnosed with MS in the last year. Pt with chronic neck pain. Pt unable to move right arm and sts she can barely move her left arm now.  Pt given 4 mg of Zofran with EMS.

## 2013-06-26 ENCOUNTER — Telehealth: Payer: Self-pay | Admitting: Diagnostic Neuroimaging

## 2013-06-26 ENCOUNTER — Telehealth: Payer: Self-pay | Admitting: Emergency Medicine

## 2013-06-26 NOTE — Telephone Encounter (Signed)
Pt called is needing something called in for her to take for anxiety before pt has procedures tomorrow in our office. Please call pt concerning this matter and also pt states she feel and not for sure what she did, pt is in pain landed on right hip and thinks she may have shocked what where she feel last weekend. Pt went to ER last weekend and they gave he rdiazepam (VALIUM) 5 MG tablet states it works and they advised her to let her Dr. know so that he can call this in for pt for her pain. Thanks

## 2013-06-26 NOTE — Telephone Encounter (Signed)
Pt requesting medication refill Valium 5 mg tab and sedative for MRI Informed pt we cant refill medication until seen by provider. Pt is scheduled for f/u appt 07/06/13 with Dr. Annitta Needs Pt told to call clinic after speaking with neurology for sedative order

## 2013-06-27 ENCOUNTER — Encounter (INDEPENDENT_AMBULATORY_CARE_PROVIDER_SITE_OTHER): Payer: Medicaid Other

## 2013-06-27 ENCOUNTER — Ambulatory Visit (INDEPENDENT_AMBULATORY_CARE_PROVIDER_SITE_OTHER): Payer: Medicaid Other | Admitting: Radiology

## 2013-06-27 ENCOUNTER — Ambulatory Visit (INDEPENDENT_AMBULATORY_CARE_PROVIDER_SITE_OTHER): Payer: Medicaid Other | Admitting: Diagnostic Neuroimaging

## 2013-06-27 ENCOUNTER — Telehealth: Payer: Self-pay

## 2013-06-27 DIAGNOSIS — R569 Unspecified convulsions: Secondary | ICD-10-CM

## 2013-06-27 DIAGNOSIS — Z0289 Encounter for other administrative examinations: Secondary | ICD-10-CM

## 2013-06-27 DIAGNOSIS — R5383 Other fatigue: Secondary | ICD-10-CM

## 2013-06-27 DIAGNOSIS — R531 Weakness: Secondary | ICD-10-CM

## 2013-06-27 DIAGNOSIS — M79609 Pain in unspecified limb: Secondary | ICD-10-CM

## 2013-06-27 DIAGNOSIS — R5381 Other malaise: Secondary | ICD-10-CM

## 2013-06-27 DIAGNOSIS — M62838 Other muscle spasm: Secondary | ICD-10-CM

## 2013-06-27 DIAGNOSIS — R9089 Other abnormal findings on diagnostic imaging of central nervous system: Secondary | ICD-10-CM

## 2013-06-27 NOTE — Telephone Encounter (Signed)
Done. Liane Comber gave xanax packet from office. -VRP

## 2013-06-28 NOTE — Procedures (Signed)
   [facility]  NCS (NERVE CONDUCTION STUDY) WITH EMG (ELECTROMYOGRAPHY) REPORT   STUDY DATE: 06/28/13 PATIENT NAME: Andrea Wade DOB: Jul 26, 1970 MRN: [id number]  ORDERING CLINICIAN: Andrey Spearman, MD   TECHNOLOGIST: Laretta Alstrom ELECTROMYOGRAPHER: Earlean Polka. Raylon Lamson, MD  CLINICAL INFORMATION: 43 year old female with upper and lower extremity pain and numbness. Has history of diabetes.  FINDINGS: NERVE CONDUCTION STUDY: Bilateral median and bilateral ulnar motor responses and F-wave latencies are normal. Bilateral peroneal responses with recording over the extensor digitorum brevis muscles could not be obtained. Bilateral peroneal motor responses with recording over the tibialis anterior muscles shows decreased amplitude and normal conduction velocity. Bilateral tibial motor responses and F-wave latencies are normal. Bilateral H reflex responses could not be obtained. Right median and bilateral ulnar sensory responses are normal. Bilateral peroneal sensory responses are normal. Left median sensory response is normal amplitude and slow conduction velocity (46 m/s).  NEEDLE ELECTROMYOGRAPHY: Left lower extremity, vastus medialis, tibialis anterior, gastrocnemius, shows no abnormal spontaneous activity at rest and normal motor unit recruitment on exertion. Lumbar paraspinal muscle evaluation was deferred as patient was in significant pain and could not be examined on exam table.  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 Concomitant lumbar polyradiculopathy (L5, S1) not excluded.   INTERPRETING PHYSICIAN:  Penni Bombard, MD Certified in Neurology, Neurophysiology and Neuroimaging  [facility] [address], [place], [place] [address] [phone]

## 2013-06-29 ENCOUNTER — Inpatient Hospital Stay: Admission: RE | Admit: 2013-06-29 | Payer: Self-pay | Source: Ambulatory Visit

## 2013-06-29 ENCOUNTER — Other Ambulatory Visit: Payer: Self-pay

## 2013-06-30 ENCOUNTER — Telehealth: Payer: Self-pay | Admitting: *Deleted

## 2013-06-30 NOTE — Telephone Encounter (Signed)
Received a call from patient wanting a referral to pain management. Informed patient the provider will have to write the referral. Informed patient she does have an appointment with  Dr. Annitta Needs on [id number] and this will be a good time to discuss with him. Patient verbalized understanding. Alverda Skeans, RN

## 2013-07-03 ENCOUNTER — Ambulatory Visit
Admission: RE | Admit: 2013-07-03 | Discharge: 2013-07-03 | Disposition: A | Discharge status: Home / Self Care | Payer: Medicaid Other | Source: Ambulatory Visit | Attending: Diagnostic Neuroimaging | Admitting: Diagnostic Neuroimaging

## 2013-07-03 DIAGNOSIS — R9089 Other abnormal findings on diagnostic imaging of central nervous system: Secondary | ICD-10-CM

## 2013-07-03 DIAGNOSIS — M79609 Pain in unspecified limb: Secondary | ICD-10-CM

## 2013-07-03 DIAGNOSIS — R531 Weakness: Secondary | ICD-10-CM

## 2013-07-03 DIAGNOSIS — R5383 Other fatigue: Secondary | ICD-10-CM

## 2013-07-03 DIAGNOSIS — R569 Unspecified convulsions: Secondary | ICD-10-CM

## 2013-07-03 DIAGNOSIS — M62838 Other muscle spasm: Secondary | ICD-10-CM

## 2013-07-03 DIAGNOSIS — R5381 Other malaise: Secondary | ICD-10-CM

## 2013-07-03 MED ORDER — GADOBENATE DIMEGLUMINE 529 MG/ML IV SOLN
19.0000 mL | Freq: Once | INTRAVENOUS | Status: AC | PRN
  Administered 2013-07-03: 19 mL via INTRAVENOUS

## 2013-07-06 ENCOUNTER — Ambulatory Visit: Payer: Medicaid Other | Attending: Internal Medicine | Admitting: Internal Medicine

## 2013-07-06 ENCOUNTER — Encounter: Payer: Self-pay | Admitting: Internal Medicine

## 2013-07-06 VITALS — BP 130/70 | HR 78 | Temp 99.0°F | Resp 16

## 2013-07-06 DIAGNOSIS — K219 Gastro-esophageal reflux disease without esophagitis: Secondary | ICD-10-CM | POA: Insufficient documentation

## 2013-07-06 DIAGNOSIS — Z7982 Long term (current) use of aspirin: Secondary | ICD-10-CM | POA: Insufficient documentation

## 2013-07-06 DIAGNOSIS — Z720 Tobacco use: Secondary | ICD-10-CM

## 2013-07-06 DIAGNOSIS — I10 Essential (primary) hypertension: Secondary | ICD-10-CM

## 2013-07-06 DIAGNOSIS — M129 Arthropathy, unspecified: Secondary | ICD-10-CM | POA: Insufficient documentation

## 2013-07-06 DIAGNOSIS — G43909 Migraine, unspecified, not intractable, without status migrainosus: Secondary | ICD-10-CM | POA: Insufficient documentation

## 2013-07-06 DIAGNOSIS — M62838 Other muscle spasm: Secondary | ICD-10-CM

## 2013-07-06 DIAGNOSIS — F411 Generalized anxiety disorder: Secondary | ICD-10-CM | POA: Insufficient documentation

## 2013-07-06 DIAGNOSIS — M2560 Stiffness of unspecified joint, not elsewhere classified: Secondary | ICD-10-CM | POA: Insufficient documentation

## 2013-07-06 DIAGNOSIS — R569 Unspecified convulsions: Secondary | ICD-10-CM

## 2013-07-06 DIAGNOSIS — F17200 Nicotine dependence, unspecified, uncomplicated: Secondary | ICD-10-CM | POA: Insufficient documentation

## 2013-07-06 DIAGNOSIS — G40909 Epilepsy, unspecified, not intractable, without status epilepticus: Secondary | ICD-10-CM | POA: Insufficient documentation

## 2013-07-06 DIAGNOSIS — G8929 Other chronic pain: Secondary | ICD-10-CM | POA: Insufficient documentation

## 2013-07-06 DIAGNOSIS — G35 Multiple sclerosis: Secondary | ICD-10-CM | POA: Insufficient documentation

## 2013-07-06 DIAGNOSIS — Z8701 Personal history of pneumonia (recurrent): Secondary | ICD-10-CM | POA: Insufficient documentation

## 2013-07-06 DIAGNOSIS — Z79899 Other long term (current) drug therapy: Secondary | ICD-10-CM | POA: Insufficient documentation

## 2013-07-06 DIAGNOSIS — F3289 Other specified depressive episodes: Secondary | ICD-10-CM | POA: Insufficient documentation

## 2013-07-06 DIAGNOSIS — Z139 Encounter for screening, unspecified: Secondary | ICD-10-CM

## 2013-07-06 DIAGNOSIS — M542 Cervicalgia: Secondary | ICD-10-CM

## 2013-07-06 DIAGNOSIS — F329 Major depressive disorder, single episode, unspecified: Secondary | ICD-10-CM | POA: Insufficient documentation

## 2013-07-06 DIAGNOSIS — D649 Anemia, unspecified: Secondary | ICD-10-CM | POA: Insufficient documentation

## 2013-07-06 DIAGNOSIS — E119 Type 2 diabetes mellitus without complications: Secondary | ICD-10-CM | POA: Insufficient documentation

## 2013-07-06 DIAGNOSIS — M549 Dorsalgia, unspecified: Secondary | ICD-10-CM | POA: Insufficient documentation

## 2013-07-06 DIAGNOSIS — K5090 Crohn's disease, unspecified, without complications: Secondary | ICD-10-CM | POA: Insufficient documentation

## 2013-07-06 LAB — GLUCOSE, POCT (MANUAL RESULT ENTRY): POC GLUCOSE: 186 mg/dL — AB (ref 70–99)

## 2013-07-06 LAB — POCT GLYCOSYLATED HEMOGLOBIN (HGB A1C): Hemoglobin A1C: 7.2

## 2013-07-06 MED ORDER — METHOCARBAMOL 500 MG PO TABS: 500.0000 mg | ORAL_TABLET | Freq: Every day | ORAL | Status: DC

## 2013-07-06 NOTE — Patient Instructions (Signed)

## 2013-07-06 NOTE — Progress Notes (Signed)
Patient here for follow up Has multiple issues -DM, migraines, anxiety crones disease

## 2013-07-06 NOTE — Progress Notes (Signed)
MRN: [id number] Name: Andrea Wade  Sex: female Age: 43 y.o. DOB: [birth year]  Allergies: Codeine; Peanut-containing drug products; and Penicillins  Chief Complaint  Patient presents with  . Follow-up    HPI: Patient is 43 y.o. female who was seen by Dr. Allyson Sabal  on the last visit history of diabetes and is seizure disorder, currently following up with the neurologist and is on [place], she denies any recent seizures, her hemoglobin A1c is improving she states metformin and Glucotrol, denies any hypoglycemic symptoms, she reported to have chronic pain in her neck back and joints, reported to have some morning stiffness in the hands as per patient in the past she was checked for lupus and rheumatoid but she reports she has family history of ? Rheumatoid arthritis. Patient is requesting referral to see a pain management.  Past Medical History  Diagnosis Date  . Diabetes type 2, uncontrolled   . Crohn disease   . Multiple sclerosis 07/2012  . Pneumonia     "about 10 times in my lifetime" (04/17/2013)  . Anemia   . GERD (gastroesophageal reflux disease)   . Daily headache   . Migraine     "q day" (04/17/2013)  . Epileptic     "had 1-2/night; started RX; last one was 01/2014"  . Chronic back pain     "all over" (04/17/2013)  . Anxiety   . Depression   . Arthritis     "joints" (04/17/2013)    Past Surgical History  Procedure Laterality Date  . Cholecystectomy  1994  . Tubal ligation  1993      Medication List       This list is accurate as of: 07/06/13 12:21 PM.  Always use your most recent med list.               aspirin EC 81 MG tablet  Take 1 tablet (81 mg total) by mouth daily.     diazepam 5 MG tablet  Commonly known as:  VALIUM  Take 1 tablet (5 mg total) by mouth every 6 (six) hours as needed for muscle spasms.     glipiZIDE 5 MG tablet  Commonly known as:  GLUCOTROL  Take 1 tablet (5 mg total) by mouth 2 (two) times daily before a meal.     levETIRAcetam  500 MG tablet  Commonly known as:  KEPPRA  Take 1 tablet (500 mg total) by mouth 2 (two) times daily.     loratadine 10 MG tablet  Commonly known as:  CLARITIN  Take 1 tablet (10 mg total) by mouth daily.     metFORMIN 500 MG tablet  Commonly known as:  GLUCOPHAGE  Take 1 tablet (500 mg total) by mouth 2 (two) times daily with a meal.     methocarbamol 500 MG tablet  Commonly known as:  ROBAXIN  Take 1 tablet (500 mg total) by mouth at bedtime.     pantoprazole 40 MG tablet  Commonly known as:  PROTONIX  Take 1 tablet (40 mg total) by mouth daily.     TYLENOL ARTHRITIS PAIN PO  Take 1 tablet by mouth every 8 (eight) hours as needed (for pain).        Meds ordered this encounter  Medications  . methocarbamol (ROBAXIN) 500 MG tablet    Sig: Take 1 tablet (500 mg total) by mouth at bedtime.    Dispense:  30 tablet    Refill:  3     There is no  immunization history on file for this patient.  Family History  Problem Relation Age of Onset  . Diabetes Mother   . Heart disease Father     History  Substance Use Topics  . Smoking status: Current Every Day Smoker -- 0.50 packs/day for 21 years    Types: Cigarettes  . Smokeless tobacco: Never Used  . Alcohol Use: No    Review of Systems   As noted in HPI  Filed Vitals:   07/06/13 1216  BP: 130/70  Pulse:   Temp:   Resp:     Physical Exam  Physical Exam  Constitutional: No distress.  Eyes: EOM are normal. Pupils are equal, round, and reactive to light.  Cardiovascular: Normal rate and regular rhythm.   Pulmonary/Chest: Breath sounds normal. No respiratory distress. She has no wheezes. She has no rales.  Musculoskeletal: She exhibits no edema.  Both hands some tenderness on MCP joints, can not make a tight fist   Diffuse paraspinal tenderness on the upper and lower back     CBC    Component Value Date/Time   WBC 9.1 06/17/2013 0240   RBC 4.62 06/17/2013 0240   RBC 4.88 09/22/2012 1005   HGB 11.1*  06/17/2013 0240   HCT 34.3* 06/17/2013 0240   PLT 383 06/17/2013 0240   MCV 74.2* 06/17/2013 0240   LYMPHSABS 2.6 04/17/2013 0628   MONOABS 0.6 04/17/2013 0628   EOSABS 0.2 04/17/2013 0628   BASOSABS 0.0 04/17/2013 0628    CMP     Component Value Date/Time   NA 139 06/17/2013 0240   K 4.0 06/17/2013 0240   CL 103 06/17/2013 0240   CO2 21 06/17/2013 0240   GLUCOSE 187* 06/17/2013 0240   BUN 4* 06/17/2013 0240   CREATININE 0.42* 06/17/2013 0240   CALCIUM 8.9 06/17/2013 0240   PROT 7.9 06/17/2013 0240   ALBUMIN 3.1* 06/17/2013 0240   AST 13 06/17/2013 0240   ALT 7 06/17/2013 0240   ALKPHOS 66 06/17/2013 0240   BILITOT 0.2* 06/17/2013 0240   GFRNONAA >90 06/17/2013 0240   GFRAA >90 06/17/2013 0240    Lab Results  Component Value Date/Time   CHOL 117 04/18/2013  7:50 AM    No components found with this basename: hga1c    Lab Results  Component Value Date/Time   AST 13 06/17/2013  2:40 AM    Assessment and Plan  DM (diabetes mellitus) - Plan:  Results for orders placed in visit on 07/06/13  GLUCOSE, POCT (MANUAL RESULT ENTRY)      Result Value Ref Range   POC Glucose 186 (*) 70 - 99 mg/dl  POCT GLYCOSYLATED HEMOGLOBIN (HGB A1C)      Result Value Ref Range   Hemoglobin A1C 7.2     Hemoglobin A1c is trending down continue with metformin and Glucotrol also advise for diet modification.  Ambulatory referral to Ophthalmology  Seizure/Multiple sclerosis Following up the neurology on Keppra.  Tobacco abuse Advised patient to quit smoking   Essential hypertension, benign Blood pressure is 130/70 manually patient is modifying the diet, reevaluate on the next visit if the blood pressure is trending up consider starting on ACE inhibitor.  Back pain - Plan: Ambulatory referral to Pain Clinic  Neck pain - Plan: Ambulatory referral to Pain Clinic  Morning stiffness of joints - Plan: Will check ANA, Rheumatoid factor  Muscle spasm - Plan: Advise for heating pad methocarbamol (ROBAXIN) 500  MG tablet each bedtime  Screening - Plan: MM DIGITAL SCREENING  BILATERAL  GERD (gastroesophageal reflux disease) Continue with Protonix.  Health Maintenance  -Mammogram: ordered   Return in about 3 months (around 10/06/2013) for diabetes.  Lorayne Marek, MD

## 2013-07-07 LAB — RHEUMATOID FACTOR: Rhuematoid fact SerPl-aCnc: 24 IU/mL — ABNORMAL HIGH (ref <=14)

## 2013-07-07 LAB — ANA: Anti Nuclear Antibody(ANA): NEGATIVE

## 2013-07-09 ENCOUNTER — Other Ambulatory Visit: Payer: Medicaid Other

## 2013-07-11 ENCOUNTER — Ambulatory Visit: Payer: Self-pay | Admitting: Nurse Practitioner

## 2013-07-14 ENCOUNTER — Telehealth: Payer: Self-pay | Admitting: *Deleted

## 2013-07-14 ENCOUNTER — Ambulatory Visit: Payer: Medicaid Other

## 2013-07-14 NOTE — Telephone Encounter (Signed)
Patient wants to know if doctor can refill her anxiety medication. Patient reports she tried the Robaxin and she vomits when she takes it. Alverda Skeans, RN

## 2013-07-15 ENCOUNTER — Ambulatory Visit
Admission: RE | Admit: 2013-07-15 | Discharge: 2013-07-15 | Disposition: A | Discharge status: Home / Self Care | Payer: Medicaid Other | Source: Ambulatory Visit | Attending: Diagnostic Neuroimaging | Admitting: Diagnostic Neuroimaging

## 2013-07-15 DIAGNOSIS — M79609 Pain in unspecified limb: Secondary | ICD-10-CM

## 2013-07-15 DIAGNOSIS — R9089 Other abnormal findings on diagnostic imaging of central nervous system: Secondary | ICD-10-CM

## 2013-07-15 DIAGNOSIS — M62838 Other muscle spasm: Secondary | ICD-10-CM

## 2013-07-15 DIAGNOSIS — R569 Unspecified convulsions: Secondary | ICD-10-CM

## 2013-07-15 DIAGNOSIS — R269 Unspecified abnormalities of gait and mobility: Secondary | ICD-10-CM

## 2013-07-15 DIAGNOSIS — R531 Weakness: Secondary | ICD-10-CM

## 2013-07-22 ENCOUNTER — Emergency Department (HOSPITAL_COMMUNITY)
Admission: EM | Admit: 2013-07-22 | Discharge: 2013-07-22 | Disposition: A | Discharge status: Home / Self Care | Payer: Medicaid Other | Attending: Emergency Medicine | Admitting: Emergency Medicine

## 2013-07-22 ENCOUNTER — Encounter (HOSPITAL_COMMUNITY): Payer: Self-pay | Admitting: Emergency Medicine

## 2013-07-22 DIAGNOSIS — M129 Arthropathy, unspecified: Secondary | ICD-10-CM | POA: Insufficient documentation

## 2013-07-22 DIAGNOSIS — K219 Gastro-esophageal reflux disease without esophagitis: Secondary | ICD-10-CM | POA: Insufficient documentation

## 2013-07-22 DIAGNOSIS — F17200 Nicotine dependence, unspecified, uncomplicated: Secondary | ICD-10-CM | POA: Insufficient documentation

## 2013-07-22 DIAGNOSIS — Z880 Allergy status to penicillin: Secondary | ICD-10-CM | POA: Insufficient documentation

## 2013-07-22 DIAGNOSIS — F329 Major depressive disorder, single episode, unspecified: Secondary | ICD-10-CM | POA: Insufficient documentation

## 2013-07-22 DIAGNOSIS — Z8719 Personal history of other diseases of the digestive system: Secondary | ICD-10-CM | POA: Insufficient documentation

## 2013-07-22 DIAGNOSIS — IMO0001 Reserved for inherently not codable concepts without codable children: Secondary | ICD-10-CM | POA: Insufficient documentation

## 2013-07-22 DIAGNOSIS — I10 Essential (primary) hypertension: Secondary | ICD-10-CM | POA: Insufficient documentation

## 2013-07-22 DIAGNOSIS — F3289 Other specified depressive episodes: Secondary | ICD-10-CM | POA: Insufficient documentation

## 2013-07-22 DIAGNOSIS — G40909 Epilepsy, unspecified, not intractable, without status epilepticus: Secondary | ICD-10-CM | POA: Insufficient documentation

## 2013-07-22 DIAGNOSIS — Z3202 Encounter for pregnancy test, result negative: Secondary | ICD-10-CM | POA: Insufficient documentation

## 2013-07-22 DIAGNOSIS — Z862 Personal history of diseases of the blood and blood-forming organs and certain disorders involving the immune mechanism: Secondary | ICD-10-CM | POA: Insufficient documentation

## 2013-07-22 DIAGNOSIS — Z8701 Personal history of pneumonia (recurrent): Secondary | ICD-10-CM | POA: Insufficient documentation

## 2013-07-22 DIAGNOSIS — R109 Unspecified abdominal pain: Secondary | ICD-10-CM

## 2013-07-22 DIAGNOSIS — R1011 Right upper quadrant pain: Secondary | ICD-10-CM | POA: Insufficient documentation

## 2013-07-22 DIAGNOSIS — G8929 Other chronic pain: Secondary | ICD-10-CM | POA: Insufficient documentation

## 2013-07-22 DIAGNOSIS — E1165 Type 2 diabetes mellitus with hyperglycemia: Secondary | ICD-10-CM

## 2013-07-22 DIAGNOSIS — Z79899 Other long term (current) drug therapy: Secondary | ICD-10-CM | POA: Insufficient documentation

## 2013-07-22 DIAGNOSIS — Z7982 Long term (current) use of aspirin: Secondary | ICD-10-CM | POA: Insufficient documentation

## 2013-07-22 DIAGNOSIS — Z8669 Personal history of other diseases of the nervous system and sense organs: Secondary | ICD-10-CM | POA: Insufficient documentation

## 2013-07-22 LAB — URINE MICROSCOPIC-ADD ON

## 2013-07-22 LAB — I-STAT TROPONIN, ED: Troponin i, poc: 0 ng/mL (ref 0.00–0.08)

## 2013-07-22 LAB — CBC WITH DIFFERENTIAL/PLATELET
BASOS ABS: 0 10*3/uL (ref 0.0–0.1)
BASOS PCT: 0 % (ref 0–1)
EOS ABS: 0.2 10*3/uL (ref 0.0–0.7)
Eosinophils Relative: 2 % (ref 0–5)
HEMATOCRIT: 33 % — AB (ref 36.0–46.0)
HEMOGLOBIN: 10.6 g/dL — AB (ref 12.0–15.0)
Lymphocytes Relative: 37 % (ref 12–46)
Lymphs Abs: 3 10*3/uL (ref 0.7–4.0)
MCH: 23.9 pg — AB (ref 26.0–34.0)
MCHC: 32.1 g/dL (ref 30.0–36.0)
MCV: 74.5 fL — ABNORMAL LOW (ref 78.0–100.0)
MONO ABS: 0.4 10*3/uL (ref 0.1–1.0)
MONOS PCT: 5 % (ref 3–12)
Neutro Abs: 4.4 10*3/uL (ref 1.7–7.7)
Neutrophils Relative %: 56 % (ref 43–77)
Platelets: 459 10*3/uL — ABNORMAL HIGH (ref 150–400)
RBC: 4.43 MIL/uL (ref 3.87–5.11)
RDW: 15.7 % — AB (ref 11.5–15.5)
WBC: 7.9 10*3/uL (ref 4.0–10.5)

## 2013-07-22 LAB — COMPREHENSIVE METABOLIC PANEL
ALBUMIN: 3.1 g/dL — AB (ref 3.5–5.2)
ALT: 10 U/L (ref 0–35)
AST: 17 U/L (ref 0–37)
Alkaline Phosphatase: 71 U/L (ref 39–117)
BUN: 4 mg/dL — AB (ref 6–23)
CO2: 28 mEq/L (ref 19–32)
CREATININE: 0.42 mg/dL — AB (ref 0.50–1.10)
Calcium: 9 mg/dL (ref 8.4–10.5)
Chloride: 100 mEq/L (ref 96–112)
GFR calc Af Amer: >90 mL/min (ref >90)
GFR calc non Af Amer: >90 mL/min (ref >90)
Glucose, Bld: 159 mg/dL — ABNORMAL HIGH (ref 70–99)
Potassium: 3.2 mEq/L — ABNORMAL LOW (ref 3.7–5.3)
Sodium: 139 mEq/L (ref 137–147)
TOTAL PROTEIN: 7.7 g/dL (ref 6.0–8.3)
Total Bilirubin: 0.3 mg/dL (ref 0.3–1.2)

## 2013-07-22 LAB — URINALYSIS, ROUTINE W REFLEX MICROSCOPIC
BILIRUBIN URINE: NEGATIVE
Glucose, UA: NEGATIVE mg/dL
KETONES UR: NEGATIVE mg/dL
Leukocytes, UA: NEGATIVE
NITRITE: NEGATIVE
Protein, ur: NEGATIVE mg/dL
SPECIFIC GRAVITY, URINE: 1.008 (ref 1.005–1.030)
Urobilinogen, UA: 0.2 mg/dL (ref 0.0–1.0)
pH: 6.5 (ref 5.0–8.0)

## 2013-07-22 LAB — LIPASE, BLOOD: Lipase: 24 U/L (ref 11–59)

## 2013-07-22 LAB — PREGNANCY, URINE: PREG TEST UR: NEGATIVE

## 2013-07-22 MED ORDER — SODIUM CHLORIDE 0.9 % IV SOLN: INTRAVENOUS | Status: DC

## 2013-07-22 MED ORDER — ONDANSETRON HCL 4 MG/2ML IJ SOLN
4.0000 mg | Freq: Once | INTRAMUSCULAR | Status: AC
  Administered 2013-07-22: 4 mg via INTRAVENOUS
  Filled 2013-07-22: qty 2

## 2013-07-22 MED ORDER — SODIUM CHLORIDE 0.9 % IV BOLUS (SEPSIS)
1000.0000 mL | Freq: Once | INTRAVENOUS | Status: AC
  Administered 2013-07-22: 1000 mL via INTRAVENOUS

## 2013-07-22 MED ORDER — HYDROMORPHONE HCL PF 1 MG/ML IJ SOLN
1.0000 mg | Freq: Once | INTRAMUSCULAR | Status: AC
  Administered 2013-07-22: 1 mg via INTRAVENOUS
  Filled 2013-07-22: qty 1

## 2013-07-22 MED ORDER — POTASSIUM CHLORIDE CRYS ER 20 MEQ PO TBCR
40.0000 meq | EXTENDED_RELEASE_TABLET | Freq: Once | ORAL | Status: DC
  Filled 2013-07-22: qty 2

## 2013-07-22 MED ORDER — POTASSIUM CHLORIDE 20 MEQ/15ML (10%) PO LIQD
40.0000 meq | Freq: Once | ORAL | Status: AC
  Administered 2013-07-22: 40 meq via ORAL
  Filled 2013-07-22: qty 30

## 2013-07-22 MED ORDER — PROMETHAZINE HCL 25 MG PO TABS: 25.0000 mg | ORAL_TABLET | Freq: Four times a day (QID) | ORAL | Status: DC | PRN

## 2013-07-22 NOTE — ED Notes (Signed)
Received from home with c/o woke up with mid to right sided abdominal pain with n/v. Pt also reports chest pain but that is chronic in nature and no changes with pain. Pt took 324 MG of ASA PTA.

## 2013-07-22 NOTE — ED Provider Notes (Addendum)
CSN: [id number]     Arrival date & time 07/22/13  1558 History   First MD Initiated Contact with Patient 07/22/13 1610     Chief Complaint  Patient presents with  . Abdominal Pain  . Nausea  . Emesis     (Consider location/radiation/quality/duration/timing/severity/associated sxs/prior Treatment) Patient is a 43 y.o. female presenting with abdominal pain and vomiting. The history is provided by the patient.  Abdominal Pain Pain location:  RUQ Pain quality: aching   Pain radiates to:  Does not radiate Pain severity:  Moderate Onset quality:  Sudden Duration:  9 hours Timing:  Constant Progression:  Worsening Chronicity:  Recurrent Relieved by:  Nothing Worsened by:  Position changes Ineffective treatments:  None tried Associated symptoms: belching, chest pain, diarrhea, dysuria, nausea and vomiting   Associated symptoms: no constipation and no fever   Emesis Associated symptoms: abdominal pain and diarrhea    Substernal chest pain and ruq abd pain that's constant x 9 hours--emesis x 10--pain is worse with movement, no tx used pta, patient has chronic chest pain--denies leg pain or swelling, no syncope or near-syncope Past Medical History  Diagnosis Date  . Diabetes type 2, uncontrolled   . Crohn disease   . Multiple sclerosis 07/2012  . Pneumonia     "about 10 times in my lifetime" (04/17/2013)  . Anemia   . GERD (gastroesophageal reflux disease)   . Daily headache   . Migraine     "q day" (04/17/2013)  . Epileptic     "had 1-2/night; started RX; last one was 01/2014"  . Chronic back pain     "all over" (04/17/2013)  . Anxiety   . Depression   . Arthritis     "joints" (04/17/2013)   Past Surgical History  Procedure Laterality Date  . Cholecystectomy  1994  . Tubal ligation  1993   Family History  Problem Relation Age of Onset  . Diabetes Mother   . Heart disease Father    History  Substance Use Topics  . Smoking status: Current Every Day Smoker -- 0.50  packs/day for 21 years    Types: Cigarettes  . Smokeless tobacco: Never Used  . Alcohol Use: No   OB History   Grav Para Term Preterm Abortions TAB SAB Ect Mult Living                 Review of Systems  Constitutional: Negative for fever.  Cardiovascular: Positive for chest pain.  Gastrointestinal: Positive for nausea, vomiting, abdominal pain and diarrhea. Negative for constipation.  Genitourinary: Positive for dysuria.  All other systems reviewed and are negative.     Allergies  Codeine; Peanut-containing drug products; and Penicillins  Home Medications   Prior to Admission medications   Medication Sig Start Date End Date Taking? Authorizing Provider  aspirin EC 81 MG tablet Take 1 tablet (81 mg total) by mouth daily. 05/02/13  Yes Reyne Dumas, MD  buprenorphine-naloxone (SUBOXONE) 2-0.5 MG SUBL SL tablet Place 1 tablet under the tongue daily.   Yes Historical Provider, MD  glipiZIDE (GLUCOTROL) 5 MG tablet Take 1 tablet (5 mg total) by mouth 2 (two) times daily before a meal. 05/02/13  Yes Reyne Dumas, MD  levETIRAcetam (KEPPRA) 500 MG tablet Take 1 tablet (500 mg total) by mouth 2 (two) times daily. 05/02/13  Yes Reyne Dumas, MD  metFORMIN (GLUCOPHAGE) 500 MG tablet Take 1 tablet (500 mg total) by mouth 2 (two) times daily with a meal. 05/02/13  Yes Reyne Dumas,  MD  pantoprazole (PROTONIX) 40 MG tablet Take 1 tablet (40 mg total) by mouth daily. 05/02/13  Yes Reyne Dumas, MD   BP 129/70  Temp(Src) 98.7 F (37.1 C) (Oral)  Resp 18  Ht 5' 8"  (1.727 m)  Wt 198 lb (89.812 kg)  BMI 30.11 kg/m2  SpO2 97%  LMP 07/22/2013 Physical Exam  Nursing note and vitals reviewed. Constitutional: She is oriented to person, place, and time. She appears well-developed and well-nourished.  Non-toxic appearance. No distress.  HENT:  Head: Normocephalic and atraumatic.  Eyes: Conjunctivae, EOM and lids are normal. Pupils are equal, round, and reactive to light.  Neck: Normal range of motion.  Neck supple. No tracheal deviation present. No mass present.  Cardiovascular: Normal rate, regular rhythm and normal heart sounds.  Exam reveals no gallop.   No murmur heard. Pulmonary/Chest: Effort normal and breath sounds normal. No stridor. No respiratory distress. She has no decreased breath sounds. She has no wheezes. She has no rhonchi. She has no rales. She exhibits no crepitus and no swelling.  Abdominal: Soft. Normal appearance and bowel sounds are normal. She exhibits no distension. There is tenderness in the right upper quadrant. There is no rebound and no CVA tenderness.    Musculoskeletal: Normal range of motion. She exhibits no edema and no tenderness.  Neurological: She is alert and oriented to person, place, and time. She has normal strength. No cranial nerve deficit or sensory deficit. GCS eye subscore is 4. GCS verbal subscore is 5. GCS motor subscore is 6.  Skin: Skin is warm and dry. No abrasion and no rash noted.  Psychiatric: She has a normal mood and affect. Her speech is normal and behavior is normal.    ED Course  Procedures (including critical care time) Labs Review Labs Reviewed  CBC WITH DIFFERENTIAL  COMPREHENSIVE METABOLIC PANEL  PREGNANCY, URINE  URINALYSIS, ROUTINE W REFLEX MICROSCOPIC  LIPASE, BLOOD  I-STAT TROPOININ, ED    Imaging Review No results found.   EKG Interpretation   Date/Time:  Saturday Jul 22 2013 16:11:25 EDT Ventricular Rate:  54 PR Interval:  155 QRS Duration: 107 QT Interval:  573 QTC Calculation: 543 R Axis:   -23 Text Interpretation:  Sinus rhythm Borderline left axis deviation  Nonspecific T abnormalities, diffuse leads Prolonged QT interval No  significant change since last tracing Confirmed by Raquel Racey  MD, Keisa Blow  ([id number]) on 07/22/2013 5:16:58 PM      MDM   Final diagnoses:  None    Patient given IV fluids and pain medication here feels better. Hematuria noted that she is currently on her menses and do not think  that she has a kidney stone. She had a low potassium which was replenished with oral potassium. No evidence of surgical abdomen time. Repeat abdominal exam prior to discharge remained stable.  Patient is that she has chronic chest pain which is due to anxiety and that she does feel more anxious recently. She also states that she has had this chronic chest pain since she was taken off of her daily Valium dose. She spoke to her primary care Dr. who prescribed her fracture or which she says has not been effective. Do not think that her chest pain represents ACS. EKG and troponin are reassuring but is not being ACS.    Leota Jacobsen, MD 07/22/13 1914  Leota Jacobsen, MD 07/22/13 [phone]

## 2013-07-22 NOTE — Discharge Instructions (Signed)
Abdominal Pain, Adult Many things can cause abdominal pain. Usually, abdominal pain is not caused by a disease and will improve without treatment. It can often be observed and treated at home. Your health care provider will do a physical exam and possibly order blood tests and X-rays to help determine the seriousness of your pain. However, in many cases, more time must pass before a clear cause of the pain can be found. Before that point, your health care provider may not know if you need more testing or further treatment. HOME CARE INSTRUCTIONS  Monitor your abdominal pain for any changes. The following actions may help to alleviate any discomfort you are experiencing:  Only take over-the-counter or prescription medicines as directed by your health care provider.  Do not take laxatives unless directed to do so by your health care provider.  Try a clear liquid diet (broth, tea, or water) as directed by your health care provider. Slowly move to a bland diet as tolerated. SEEK MEDICAL CARE IF:  You have unexplained abdominal pain.  You have abdominal pain associated with nausea or diarrhea.  You have pain when you urinate or have a bowel movement.  You experience abdominal pain that wakes you in the night.  You have abdominal pain that is worsened or improved by eating food.  You have abdominal pain that is worsened with eating fatty foods. SEEK IMMEDIATE MEDICAL CARE IF:   Your pain does not go away within 2 hours.  You have a fever.  You keep throwing up (vomiting).  Your pain is felt only in portions of the abdomen, such as the right side or the left lower portion of the abdomen.  You pass bloody or black tarry stools. MAKE SURE YOU:  Understand these instructions.   Will watch your condition.   Will get help right away if you are not doing well or get worse.  Document Released: 11/26/2004 Document Revised: 12/07/2012 Document Reviewed: 10/26/2012 [facility] Patient  Information 2014 [place].       Behavioral Health Resources in the [facility]  Intensive Outpatient Programs: [facility]      [place]. [place], [place] Both a day and evening program       [facility] Outpatient     [address]        [place], [place] [address] [phone]         ADS: [facility] [place]: [phone] or [phone] [address] [place], [place] [address] [email or web address]  Mobile Crisis Teams:                                        [facility]         Mobile Crisis Care Unit [phone]             [facility] [place] Dr. Lady Gary [place] [address] [place] [phone]  Self-Help/Support Groups: [facility] [employer] of support groups [phone] (call for more info)  Narcotics Anonymous (NA)  [facility] [place] - 2 meetings at this location  Residential Treatment Programs:  [facility] Residential [facility]      [place]       [place] [address] [place], [place]  [address] [place]  [address] [place], [place] [address] [phone] Admissions: 8am-3pm M-F  [facility] [place]     [address] [place], [place] [address]       [phone]         [facility] [place] [address] Jadene Pierini [place], [place]  [facility] [address] [place], [place]  [facility] - Intensive Outpatient      [address]     [place],  [place]         [facility]     [place], [place] or [phone]  Residential Treatment Services (RTS)  [place], [place]  [facility] [address]                                               [place]  [facility] Resources: [phone]               General Therapy                                                Domenic Schwab, PhD        [address] [place], [place] [address]         [facility]   [address] [place], [place] [address] [phone]  [facility] [address] [place], [place] [address] [phone] Insurance/Medicaid/sponsorship through [facility]                                              [address] [place]                                        [place],  [address]    Therapy/tele-psych/case         [place] [address]   [address]  Adolescent/group home/case management [phone]  Rosette Reveal PhD       General therapy       Insurance   [id number]         Dr. Adele Schilder Insurance [phone] M-F  [facility] Medicaid, sponsorship [phone]

## 2013-07-28 NOTE — Procedures (Signed)
   [facility]  EEG (ELECTROENCEPHALOGRAM) REPORT   STUDY DATE: 06/27/13 PATIENT NAME: Andrea Wade DOB: 1971-01-24 MRN: [id number]  ORDERING CLINICIAN: Andrey Spearman, MD   TECHNOLOGIST: Towana Badger  TECHNIQUE: Electroencephalogram was recorded utilizing standard 10-20 system of lead placement and reformatted into average and bipolar montages.  RECORDING TIME: 25 minutes ACTIVATION: hyperventilation and photic stimulation  CLINICAL INFORMATION: 43 year old female with intermittent episodes of muscle spasm and syncope.  FINDINGS: Background rhythms of 11-12 hertz and 40-50 microvolts. No focal, lateralizing, epileptiform activity or seizures are seen. Patient recorded in the awake and drowsy state. EKG channel shows regular rhythm 84 beats per minute.  IMPRESSION:  Normal EEG in the awake and drowsy states.   INTERPRETING PHYSICIAN:  Penni Bombard, MD Certified in Neurology, Neurophysiology and Neuroimaging  [facility] [address], [place], [place] [address] [phone]

## 2013-08-21 ENCOUNTER — Telehealth: Payer: Self-pay | Admitting: *Deleted

## 2013-08-21 NOTE — Telephone Encounter (Signed)
Patient called today regarding wanting a referral to another pain specialist. Patient states the pain specialist she was sent to informed her that her pain is to wide spread and he is unable to help her. Patient states she would like to go to a [facility] clinic named [facility] in [place], [place]. Vivia Birmingham, RN

## 2013-08-28 NOTE — Telephone Encounter (Signed)
I did sent the new referral waiting for an appointment and patient is aware of that

## 2013-09-08 ENCOUNTER — Encounter: Payer: Self-pay | Admitting: Diagnostic Neuroimaging

## 2013-09-08 ENCOUNTER — Telehealth: Payer: Self-pay | Admitting: Diagnostic Neuroimaging

## 2013-09-08 ENCOUNTER — Other Ambulatory Visit: Payer: Self-pay | Admitting: *Deleted

## 2013-09-08 DIAGNOSIS — K219 Gastro-esophageal reflux disease without esophagitis: Secondary | ICD-10-CM

## 2013-09-08 DIAGNOSIS — E1110 Type 2 diabetes mellitus with ketoacidosis without coma: Secondary | ICD-10-CM

## 2013-09-08 MED ORDER — GLIPIZIDE 5 MG PO TABS: 5.0000 mg | ORAL_TABLET | Freq: Two times a day (BID) | ORAL | Status: DC

## 2013-09-08 MED ORDER — PANTOPRAZOLE SODIUM 40 MG PO TBEC: 40.0000 mg | DELAYED_RELEASE_TABLET | Freq: Every day | ORAL | Status: DC

## 2013-09-08 NOTE — Telephone Encounter (Signed)
Spoke to patient regarding rescheduling 09/18/13 appointment per Dr. Gladstone Lighter schedule, patient verbalized understanding, printed and mailed letter with new appointment time.

## 2013-09-18 ENCOUNTER — Ambulatory Visit: Payer: Self-pay | Admitting: Diagnostic Neuroimaging

## 2013-10-05 ENCOUNTER — Encounter: Payer: Self-pay | Admitting: Internal Medicine

## 2013-10-05 ENCOUNTER — Ambulatory Visit: Payer: Medicaid Other | Attending: Internal Medicine | Admitting: Internal Medicine

## 2013-10-05 VITALS — BP 119/81 | HR 80 | Temp 98.6°F | Resp 16

## 2013-10-05 DIAGNOSIS — E119 Type 2 diabetes mellitus without complications: Secondary | ICD-10-CM | POA: Diagnosis present

## 2013-10-05 DIAGNOSIS — K219 Gastro-esophageal reflux disease without esophagitis: Secondary | ICD-10-CM | POA: Diagnosis not present

## 2013-10-05 DIAGNOSIS — F17200 Nicotine dependence, unspecified, uncomplicated: Secondary | ICD-10-CM | POA: Diagnosis not present

## 2013-10-05 DIAGNOSIS — G35 Multiple sclerosis: Secondary | ICD-10-CM

## 2013-10-05 DIAGNOSIS — D649 Anemia, unspecified: Secondary | ICD-10-CM

## 2013-10-05 DIAGNOSIS — E089 Diabetes mellitus due to underlying condition without complications: Secondary | ICD-10-CM | POA: Insufficient documentation

## 2013-10-05 DIAGNOSIS — N920 Excessive and frequent menstruation with regular cycle: Secondary | ICD-10-CM | POA: Diagnosis not present

## 2013-10-05 DIAGNOSIS — E876 Hypokalemia: Secondary | ICD-10-CM | POA: Diagnosis not present

## 2013-10-05 DIAGNOSIS — E139 Other specified diabetes mellitus without complications: Secondary | ICD-10-CM

## 2013-10-05 LAB — GLUCOSE, POCT (MANUAL RESULT ENTRY): POC GLUCOSE: 212 mg/dL — AB (ref 70–99)

## 2013-10-05 LAB — POCT GLYCOSYLATED HEMOGLOBIN (HGB A1C): HEMOGLOBIN A1C: 7.1

## 2013-10-05 NOTE — Progress Notes (Signed)
MRN: [id number] Name: Andrea Wade  Sex: female Age: 43 y.o. DOB: 1970-07-11  Allergies: Codeine; Peanut-containing drug products; and Penicillins  Chief Complaint  Patient presents with  . Follow-up    HPI: Patient is 43 y.o. female who has to of diabetes GERD MS/seizure disorder, chronic pain comes today for followup, she denies any hypoglycemic symptoms and is taking metformin and Glucotrol, her hemoglobin A1c slightly improved, she still smokes cigarettes, advised patient to quit smoking, she's also scheduled to follow up with her neurologist. Patient had a blood work done which was reviewed noticed anemia, she does report heavy menstrual bleeding. Also noticed low potassium level. Past Medical History  Diagnosis Date  . Diabetes type 2, uncontrolled   . Crohn disease   . Multiple sclerosis 07/2012  . Pneumonia     "about 10 times in my lifetime" (04/17/2013)  . Anemia   . GERD (gastroesophageal reflux disease)   . Daily headache   . Migraine     "q day" (04/17/2013)  . Epileptic     "had 1-2/night; started RX; last one was 01/2014"  . Chronic back pain     "all over" (04/17/2013)  . Anxiety   . Depression   . Arthritis     "joints" (04/17/2013)    Past Surgical History  Procedure Laterality Date  . Cholecystectomy  1994  . Tubal ligation  1993      Medication List       This list is accurate as of: 10/05/13  5:20 PM.  Always use your most recent med list.               aspirin EC 81 MG tablet  Take 1 tablet (81 mg total) by mouth daily.     baclofen 10 MG tablet  Commonly known as:  LIORESAL  Take 10 mg by mouth 3 (three) times daily.     buprenorphine-naloxone 2-0.5 MG Subl SL tablet  Commonly known as:  SUBOXONE  Place 1 tablet under the tongue daily.     glipiZIDE 5 MG tablet  Commonly known as:  GLUCOTROL  Take 1 tablet (5 mg total) by mouth 2 (two) times daily before a meal.     HYDROmorphone HCl 12 MG T24a SR tablet  Commonly known as:   EXALGO  Take 12 mg by mouth daily.     levETIRAcetam 500 MG tablet  Commonly known as:  KEPPRA  Take 1 tablet (500 mg total) by mouth 2 (two) times daily.     metFORMIN 500 MG tablet  Commonly known as:  GLUCOPHAGE  Take 1 tablet (500 mg total) by mouth 2 (two) times daily with a meal.     pantoprazole 40 MG tablet  Commonly known as:  PROTONIX  Take 1 tablet (40 mg total) by mouth daily.     promethazine 25 MG tablet  Commonly known as:  PHENERGAN  Take 1 tablet (25 mg total) by mouth every 6 (six) hours as needed for nausea or vomiting.        Meds ordered this encounter  Medications  . baclofen (LIORESAL) 10 MG tablet    Sig: Take 10 mg by mouth 3 (three) times daily.  Marland Kitchen HYDROmorphone HCl (EXALGO) 12 MG T24A SR tablet    Sig: Take 12 mg by mouth daily.     There is no immunization history on file for this patient.  Family History  Problem Relation Age of Onset  . Diabetes Mother   .  Heart disease Father     History  Substance Use Topics  . Smoking status: Current Every Day Smoker -- 0.50 packs/day for 21 years    Types: Cigarettes  . Smokeless tobacco: Never Used  . Alcohol Use: No    Review of Systems   As noted in HPI  Filed Vitals:   10/05/13 1647  BP: 119/81  Pulse: 80  Temp: 98.6 F (37 C)  Resp: 16    Physical Exam  Physical Exam  Constitutional: No distress.  Patient is on wheelchair  Eyes: EOM are normal. Pupils are equal, round, and reactive to light.  Neck: Neck supple.  Cardiovascular: Normal rate and regular rhythm.   Pulmonary/Chest: Breath sounds normal. No respiratory distress. She has no wheezes. She has no rales.  Musculoskeletal: She exhibits no edema.    CBC    Component Value Date/Time   WBC 7.9 07/22/2013 1641   RBC 4.43 07/22/2013 1641   RBC 4.88 09/22/2012 1005   HGB 10.6* 07/22/2013 1641   HCT 33.0* 07/22/2013 1641   PLT 459* 07/22/2013 1641   MCV 74.5* 07/22/2013 1641   LYMPHSABS 3.0 07/22/2013 1641   MONOABS 0.4  07/22/2013 1641   EOSABS 0.2 07/22/2013 1641   BASOSABS 0.0 07/22/2013 1641    CMP     Component Value Date/Time   NA 139 07/22/2013 1641   K 3.2* 07/22/2013 1641   CL 100 07/22/2013 1641   CO2 28 07/22/2013 1641   GLUCOSE 159* 07/22/2013 1641   BUN 4* 07/22/2013 1641   CREATININE 0.42* 07/22/2013 1641   CALCIUM 9.0 07/22/2013 1641   PROT 7.7 07/22/2013 1641   ALBUMIN 3.1* 07/22/2013 1641   AST 17 07/22/2013 1641   ALT 10 07/22/2013 1641   ALKPHOS 71 07/22/2013 1641   BILITOT 0.3 07/22/2013 1641   GFRNONAA >90 07/22/2013 1641   GFRAA >90 07/22/2013 1641    Lab Results  Component Value Date/Time   CHOL 117 04/18/2013  7:50 AM    No components found with this basename: hga1c    Lab Results  Component Value Date/Time   AST 17 07/22/2013  4:41 PM    Assessment and Plan  Diabetes mellitus due to underlying condition without complications - Plan:  Results for orders placed in visit on 10/05/13  GLUCOSE, POCT (MANUAL RESULT ENTRY)      Result Value Ref Range   POC Glucose 212 (*) 70 - 99 mg/dl  POCT GLYCOSYLATED HEMOGLOBIN (HGB A1C)      Result Value Ref Range   Hemoglobin A1C 7.1     A1c is trending down, I have advised patient for diabetes meal planning continue with metformin and Glucotrol.  Gastroesophageal reflux disease, esophagitis presence not specified Currently patient is on Protonix.  Menorrhagia with regular cycle - Plan: Ambulatory referral to Gynecology  Smoking Advised patient to quit smoking  Multiple sclerosis Patient following up with the neurologist. Anemia, unspecified - Plan: Most likely secondary to menorrhagia, we'll do Anemia panel  Hypokalemia - Plan: I have ordered repeat her Potassium level    Return in about 3 months (around 01/05/2014) for diabetes.  Lorayne Marek, MD

## 2013-10-05 NOTE — Progress Notes (Signed)
Patient here for follow up on her diabetes

## 2013-10-05 NOTE — Patient Instructions (Signed)
Diabetes Mellitus and Food It is important for you to manage your blood sugar (glucose) level. Your blood glucose level can be greatly affected by what you eat. Eating healthier foods in the appropriate amounts throughout the day at about the same time each day will help you control your blood glucose level. It can also help slow or prevent worsening of your diabetes mellitus. Healthy eating may even help you improve the level of your blood pressure and reach or maintain a healthy weight.  HOW CAN FOOD AFFECT ME? Carbohydrates Carbohydrates affect your blood glucose level more than any other type of food. Your dietitian will help you determine how many carbohydrates to eat at each meal and teach you how to count carbohydrates. Counting carbohydrates is important to keep your blood glucose at a healthy level, especially if you are using insulin or taking certain medicines for diabetes mellitus. Alcohol Alcohol can cause sudden decreases in blood glucose (hypoglycemia), especially if you use insulin or take certain medicines for diabetes mellitus. Hypoglycemia can be a life-threatening condition. Symptoms of hypoglycemia (sleepiness, dizziness, and disorientation) are similar to symptoms of having too much alcohol.  If your health care provider has given you approval to drink alcohol, do so in moderation and use the following guidelines:  Women should not have more than one drink per day, and men should not have more than two drinks per day. One drink is equal to:  12 oz of beer.  5 oz of wine.  1 oz of hard liquor.  Do not drink on an empty stomach.  Keep yourself hydrated. Have water, diet soda, or unsweetened iced tea.  Regular soda, juice, and other mixers might contain a lot of carbohydrates and should be counted. WHAT FOODS ARE NOT RECOMMENDED? As you make food choices, it is important to remember that all foods are not the same. Some foods have fewer nutrients per serving than other  foods, even though they might have the same number of calories or carbohydrates. It is difficult to get your body what it needs when you eat foods with fewer nutrients. Examples of foods that you should avoid that are high in calories and carbohydrates but low in nutrients include:  Trans fats (most processed foods list trans fats on the Nutrition Facts label).  Regular soda.  Juice.  Candy.  Sweets, such as cake, pie, doughnuts, and cookies.  Fried foods. WHAT FOODS CAN I EAT? Have nutrient-rich foods, which will nourish your body and keep you healthy. The food you should eat also will depend on several factors, including:  The calories you need.  The medicines you take.  Your weight.  Your blood glucose level.  Your blood pressure level.  Your cholesterol level. You also should eat a variety of foods, including:  Protein, such as meat, poultry, fish, tofu, nuts, and seeds (lean animal proteins are best).  Fruits.  Vegetables.  Dairy products, such as milk, cheese, and yogurt (low fat is best).  Breads, grains, pasta, cereal, rice, and beans.  Fats such as olive oil, trans fat-free margarine, canola oil, avocado, and olives. DOES EVERYONE WITH DIABETES MELLITUS HAVE THE SAME MEAL PLAN? Because every person with diabetes mellitus is different, there is not one meal plan that works for everyone. It is very important that you meet with a dietitian who will help you create a meal plan that is just right for you. Document Released: 11/13/2004 Document Revised: 02/21/2013 Document Reviewed: 01/13/2013 [employer] Patient Information 2015 [employer]. This   information is not intended to replace advice given to you by your health care provider. Make sure you discuss any questions you have with your health care provider.  

## 2013-10-06 ENCOUNTER — Ambulatory Visit: Payer: Medicaid Other | Admitting: Internal Medicine

## 2013-10-06 ENCOUNTER — Telehealth: Payer: Self-pay

## 2013-10-06 LAB — ANEMIA PANEL
%SAT: 7 % — ABNORMAL LOW (ref 20–55)
ABS RETIC: 33.6 10*3/uL (ref 19.0–186.0)
FERRITIN: 9 ng/mL — AB (ref 10–291)
Iron: 23 ug/dL — ABNORMAL LOW (ref 42–145)
RBC.: 4.8 MIL/uL (ref 3.87–5.11)
Retic Ct Pct: 0.7 % (ref 0.4–2.3)
TIBC: 347 ug/dL (ref 250–470)
UIBC: 324 ug/dL (ref 125–400)
Vitamin B-12: 345 pg/mL (ref 211–911)

## 2013-10-06 LAB — POTASSIUM: POTASSIUM: 4.3 meq/L (ref 3.5–5.3)

## 2013-10-06 NOTE — Telephone Encounter (Signed)
Patient is aware of her lab results 

## 2013-10-06 NOTE — Telephone Encounter (Signed)
Message copied by Dorothe Pea on Fri Oct 06, 2013  3:44 PM ------      Message from: Lorayne Marek      Created: Fri Oct 06, 2013 11:44 AM       Call and let the patient know that her potassium level is in normal range, her anemia panel is consistent with iron deficiency, advise patient to take over-the-counter iron supplement 3 times a day       ------

## 2013-10-09 ENCOUNTER — Telehealth: Payer: Self-pay | Admitting: Internal Medicine

## 2013-10-09 NOTE — Telephone Encounter (Signed)
Pt requesting whether script can be called in for nausea. Please f/u with pt.

## 2013-10-10 ENCOUNTER — Telehealth: Payer: Self-pay | Admitting: Internal Medicine

## 2013-10-10 NOTE — Telephone Encounter (Signed)
Pt needs medication refill for protonax 40 mg, glapicide 5 mg, keppra 500 mg at [facility] Please f/u wit Pt

## 2013-10-13 ENCOUNTER — Telehealth: Payer: Self-pay | Admitting: Internal Medicine

## 2013-10-13 NOTE — Telephone Encounter (Signed)
Pt. Calling again to get refills on Glipizide, Protonix, Keppra. Please f/u with pt.

## 2013-10-16 ENCOUNTER — Telehealth: Payer: Self-pay | Admitting: Internal Medicine

## 2013-10-16 ENCOUNTER — Telehealth: Payer: Self-pay | Admitting: Emergency Medicine

## 2013-10-16 DIAGNOSIS — E1110 Type 2 diabetes mellitus with ketoacidosis without coma: Secondary | ICD-10-CM

## 2013-10-16 DIAGNOSIS — K219 Gastro-esophageal reflux disease without esophagitis: Secondary | ICD-10-CM

## 2013-10-16 MED ORDER — PANTOPRAZOLE SODIUM 40 MG PO TBEC: 40.0000 mg | DELAYED_RELEASE_TABLET | Freq: Every day | ORAL | Status: DC

## 2013-10-16 MED ORDER — GLIPIZIDE 5 MG PO TABS: 5.0000 mg | ORAL_TABLET | Freq: Two times a day (BID) | ORAL | Status: DC

## 2013-10-16 MED ORDER — LEVETIRACETAM 500 MG PO TABS: 500.0000 mg | ORAL_TABLET | Freq: Two times a day (BID) | ORAL | Status: DC

## 2013-10-16 NOTE — Telephone Encounter (Signed)
Medications Glipizide,Protonix and Keppra reordered and e-scribed to [facility]

## 2013-10-16 NOTE — Telephone Encounter (Signed)
Pt needs medication refill keppra  And protonix and glipacade Please f/u with Pt ASAP

## 2013-10-17 ENCOUNTER — Telehealth: Payer: Self-pay

## 2013-11-02 ENCOUNTER — Ambulatory Visit: Payer: Self-pay | Admitting: Diagnostic Neuroimaging

## 2013-11-07 ENCOUNTER — Emergency Department (HOSPITAL_COMMUNITY)
Admission: EM | Admit: 2013-11-07 | Discharge: 2013-11-07 | Disposition: A | Discharge status: Home / Self Care | Payer: Medicaid Other | Attending: Emergency Medicine | Admitting: Emergency Medicine

## 2013-11-07 ENCOUNTER — Encounter (HOSPITAL_COMMUNITY): Payer: Self-pay | Admitting: Emergency Medicine

## 2013-11-07 ENCOUNTER — Emergency Department (HOSPITAL_COMMUNITY): Payer: Medicaid Other

## 2013-11-07 DIAGNOSIS — G8929 Other chronic pain: Secondary | ICD-10-CM | POA: Diagnosis not present

## 2013-11-07 DIAGNOSIS — Z79899 Other long term (current) drug therapy: Secondary | ICD-10-CM | POA: Diagnosis not present

## 2013-11-07 DIAGNOSIS — M129 Arthropathy, unspecified: Secondary | ICD-10-CM | POA: Insufficient documentation

## 2013-11-07 DIAGNOSIS — K219 Gastro-esophageal reflux disease without esophagitis: Secondary | ICD-10-CM | POA: Insufficient documentation

## 2013-11-07 DIAGNOSIS — R079 Chest pain, unspecified: Secondary | ICD-10-CM

## 2013-11-07 DIAGNOSIS — G43909 Migraine, unspecified, not intractable, without status migrainosus: Secondary | ICD-10-CM | POA: Insufficient documentation

## 2013-11-07 DIAGNOSIS — G40909 Epilepsy, unspecified, not intractable, without status epilepticus: Secondary | ICD-10-CM | POA: Diagnosis not present

## 2013-11-07 DIAGNOSIS — F411 Generalized anxiety disorder: Secondary | ICD-10-CM | POA: Insufficient documentation

## 2013-11-07 DIAGNOSIS — E1165 Type 2 diabetes mellitus with hyperglycemia: Secondary | ICD-10-CM

## 2013-11-07 DIAGNOSIS — F17200 Nicotine dependence, unspecified, uncomplicated: Secondary | ICD-10-CM | POA: Insufficient documentation

## 2013-11-07 DIAGNOSIS — F3289 Other specified depressive episodes: Secondary | ICD-10-CM | POA: Insufficient documentation

## 2013-11-07 DIAGNOSIS — Z7982 Long term (current) use of aspirin: Secondary | ICD-10-CM | POA: Insufficient documentation

## 2013-11-07 DIAGNOSIS — IMO0001 Reserved for inherently not codable concepts without codable children: Secondary | ICD-10-CM | POA: Diagnosis not present

## 2013-11-07 DIAGNOSIS — R110 Nausea: Secondary | ICD-10-CM | POA: Insufficient documentation

## 2013-11-07 DIAGNOSIS — Z8701 Personal history of pneumonia (recurrent): Secondary | ICD-10-CM | POA: Diagnosis not present

## 2013-11-07 DIAGNOSIS — Z880 Allergy status to penicillin: Secondary | ICD-10-CM | POA: Insufficient documentation

## 2013-11-07 DIAGNOSIS — Z862 Personal history of diseases of the blood and blood-forming organs and certain disorders involving the immune mechanism: Secondary | ICD-10-CM | POA: Insufficient documentation

## 2013-11-07 DIAGNOSIS — F329 Major depressive disorder, single episode, unspecified: Secondary | ICD-10-CM | POA: Diagnosis not present

## 2013-11-07 LAB — I-STAT CHEM 8, ED
BUN: <3 mg/dL — ABNORMAL LOW (ref 6–23)
Calcium, Ion: 1.17 mmol/L (ref 1.12–1.23)
Chloride: 101 mEq/L (ref 96–112)
Creatinine, Ser: 0.5 mg/dL (ref 0.50–1.10)
Glucose, Bld: 244 mg/dL — ABNORMAL HIGH (ref 70–99)
HEMATOCRIT: 34 % — AB (ref 36.0–46.0)
HEMOGLOBIN: 11.6 g/dL — AB (ref 12.0–15.0)
POTASSIUM: 3.6 meq/L — AB (ref 3.7–5.3)
SODIUM: 137 meq/L (ref 137–147)
TCO2: 24 mmol/L (ref 0–100)

## 2013-11-07 LAB — CBC
HCT: 32 % — ABNORMAL LOW (ref 36.0–46.0)
Hemoglobin: 10.3 g/dL — ABNORMAL LOW (ref 12.0–15.0)
MCH: 22.8 pg — AB (ref 26.0–34.0)
MCHC: 32.2 g/dL (ref 30.0–36.0)
MCV: 71 fL — ABNORMAL LOW (ref 78.0–100.0)
PLATELETS: 408 10*3/uL — AB (ref 150–400)
RBC: 4.51 MIL/uL (ref 3.87–5.11)
RDW: 15.5 % (ref 11.5–15.5)
WBC: 8.1 10*3/uL (ref 4.0–10.5)

## 2013-11-07 LAB — I-STAT TROPONIN, ED
Troponin i, poc: 0 ng/mL (ref 0.00–0.08)
Troponin i, poc: 0.01 ng/mL (ref 0.00–0.08)

## 2013-11-07 LAB — COMPREHENSIVE METABOLIC PANEL
ALT: 16 U/L (ref 0–35)
ANION GAP: 13 (ref 5–15)
AST: 31 U/L (ref 0–37)
Albumin: 3 g/dL — ABNORMAL LOW (ref 3.5–5.2)
Alkaline Phosphatase: 64 U/L (ref 39–117)
BUN: 4 mg/dL — ABNORMAL LOW (ref 6–23)
CO2: 24 mEq/L (ref 19–32)
Calcium: 8.7 mg/dL (ref 8.4–10.5)
Chloride: 100 mEq/L (ref 96–112)
Creatinine, Ser: 0.45 mg/dL — ABNORMAL LOW (ref 0.50–1.10)
GFR calc Af Amer: >90 mL/min (ref >90)
GFR calc non Af Amer: >90 mL/min (ref >90)
Glucose, Bld: 233 mg/dL — ABNORMAL HIGH (ref 70–99)
POTASSIUM: 3.7 meq/L (ref 3.7–5.3)
SODIUM: 137 meq/L (ref 137–147)
TOTAL PROTEIN: 7.4 g/dL (ref 6.0–8.3)
Total Bilirubin: 0.3 mg/dL (ref 0.3–1.2)

## 2013-11-07 LAB — TROPONIN I: Troponin I: <0.3 ng/mL (ref <0.30)

## 2013-11-07 MED ORDER — ONDANSETRON HCL 4 MG/2ML IJ SOLN
4.0000 mg | Freq: Once | INTRAMUSCULAR | Status: AC
  Administered 2013-11-07: 4 mg via INTRAVENOUS
  Filled 2013-11-07: qty 2

## 2013-11-07 MED ORDER — MORPHINE SULFATE 4 MG/ML IJ SOLN
4.0000 mg | Freq: Once | INTRAMUSCULAR | Status: AC
  Administered 2013-11-07: 4 mg via INTRAVENOUS
  Filled 2013-11-07: qty 1

## 2013-11-07 MED ORDER — NITROGLYCERIN 0.4 MG SL SUBL
0.4000 mg | SUBLINGUAL_TABLET | SUBLINGUAL | Status: AC | PRN
  Administered 2013-11-07 (×3): 0.4 mg via SUBLINGUAL
  Filled 2013-11-07: qty 1

## 2013-11-07 NOTE — ED Notes (Signed)
Cp began this morning. Nausea woke her up from sleep. Central CP non radiating. Denies sob. Hx of MS

## 2013-11-07 NOTE — ED Notes (Signed)
Pt resting; Raquel Sarna, RN at bedside

## 2013-11-07 NOTE — Discharge Instructions (Signed)
Continue your home pain medications. Follow-up with your primary care physician later this week for re-check. Return to the ED for new concerns.

## 2013-11-07 NOTE — ED Provider Notes (Signed)
CSN: [id number]     Arrival date & time 11/07/13  0439 History   First MD Initiated Contact with Patient 11/07/13 0600     Chief Complaint  Patient presents with  . Chest Pain     (Consider location/radiation/quality/duration/timing/severity/associated sxs/prior Treatment) Patient is a 43 y.o. female presenting with chest pain. The history is provided by the patient and medical records.  Chest Pain Associated symptoms: nausea    This is a 43 y.o. F with PMH significant for DM2, MS, anemia, GERD, migraine headaches, anxiety, depression, presenting to the ED for chest pain.  Patient states chest pain started around 0300 this morning, woke her from sleep, and has been constant since that time.  States initially pain was localized in her midsternal region, but is now more localized to the right side of her chest. No radiation into arms or neck.  She denies any associated shortness of breath, palpitations, dizziness, or weakness.  She has some nausea but denies vomiting.  Patient has no prior cardiac hx.  Strong family hx of CAD and MI.  Patient is a daily smoker, approx half PPD.  No recent travel, LE edema, calf pain, surgeries.  No prior hx of DVT or PE.  VS stable on arrival.  Past Medical History  Diagnosis Date  . Diabetes type 2, uncontrolled   . Crohn disease   . Multiple sclerosis 07/2012  . Pneumonia     "about 10 times in my lifetime" (04/17/2013)  . Anemia   . GERD (gastroesophageal reflux disease)   . Daily headache   . Migraine     "q day" (04/17/2013)  . Epileptic     "had 1-2/night; started RX; last one was 01/2014"  . Chronic back pain     "all over" (04/17/2013)  . Anxiety   . Depression   . Arthritis     "joints" (04/17/2013)   Past Surgical History  Procedure Laterality Date  . Cholecystectomy  1994  . Tubal ligation  1993   Family History  Problem Relation Age of Onset  . Diabetes Mother   . Heart disease Father    History  Substance Use Topics  . Smoking  status: Current Every Day Smoker -- 0.50 packs/day for 21 years    Types: Cigarettes  . Smokeless tobacco: Never Used  . Alcohol Use: No   OB History   Grav Para Term Preterm Abortions TAB SAB Ect Mult Living                 Review of Systems  Cardiovascular: Positive for chest pain.  Gastrointestinal: Positive for nausea.  All other systems reviewed and are negative.     Allergies  Codeine; Peanut-containing drug products; and Penicillins  Home Medications   Prior to Admission medications   Medication Sig Start Date End Date Taking? Authorizing Provider  aspirin EC 81 MG tablet Take 1 tablet (81 mg total) by mouth daily. 05/02/13  Yes Reyne Dumas, MD  baclofen (LIORESAL) 10 MG tablet Take 10 mg by mouth 3 (three) times daily.   Yes Historical Provider, MD  Gabapentin Enacarbil (HORIZANT) 600 MG TBCR Take 600 mg by mouth every evening.   Yes Historical Provider, MD  glipiZIDE (GLUCOTROL) 5 MG tablet Take 1 tablet (5 mg total) by mouth 2 (two) times daily before a meal. 10/16/13  Yes Deepak Advani, MD  HYDROmorphone HCl (EXALGO) 12 MG T24A SR tablet Take 12 mg by mouth daily.   Yes Historical Provider, MD  levETIRAcetam (  KEPPRA) 500 MG tablet Take 1 tablet (500 mg total) by mouth 2 (two) times daily. 10/16/13  Yes Lorayne Marek, MD  metFORMIN (GLUCOPHAGE) 500 MG tablet Take 1 tablet (500 mg total) by mouth 2 (two) times daily with a meal. 05/02/13  Yes Reyne Dumas, MD  pantoprazole (PROTONIX) 40 MG tablet Take 1 tablet (40 mg total) by mouth daily. 10/16/13  Yes Deepak Advani, MD   BP 121/75  Pulse 67  Temp(Src) 98.8 F (37.1 C) (Oral)  Resp 20  SpO2 96%  LMP 10/31/2013  Physical Exam  Nursing note and vitals reviewed. Constitutional: She is oriented to person, place, and time. She appears well-developed and well-nourished. No distress.  HENT:  Head: Normocephalic and atraumatic.  Mouth/Throat: Oropharynx is clear and moist.  Eyes: Conjunctivae and EOM are normal. Pupils are  equal, round, and reactive to light.  Neck: Normal range of motion. Neck supple.  Cardiovascular: Normal rate, regular rhythm and normal heart sounds.   Pulmonary/Chest: Effort normal and breath sounds normal. No respiratory distress. She has no wheezes.  Chest wall non-tender  Abdominal: Soft. Bowel sounds are normal. There is no tenderness. There is no guarding.  Musculoskeletal: Normal range of motion. She exhibits no edema.  Neurological: She is alert and oriented to person, place, and time.  Skin: Skin is warm and dry. She is not diaphoretic.  Psychiatric: She has a normal mood and affect.    ED Course  Procedures (including critical care time) Labs Review Labs Reviewed  CBC - Abnormal; Notable for the following:    Hemoglobin 10.3 (*)    HCT 32.0 (*)    MCV 71.0 (*)    MCH 22.8 (*)    Platelets 408 (*)    All other components within normal limits  COMPREHENSIVE METABOLIC PANEL - Abnormal; Notable for the following:    Glucose, Bld 233 (*)    BUN 4 (*)    Creatinine, Ser 0.45 (*)    Albumin 3.0 (*)    All other components within normal limits  I-STAT CHEM 8, ED - Abnormal; Notable for the following:    Potassium 3.6 (*)    BUN <3 (*)    Glucose, Bld 244 (*)    Hemoglobin 11.6 (*)    HCT 34.0 (*)    All other components within normal limits  TROPONIN I  I-STAT TROPOININ, ED    Imaging Review Dg Chest Portable 1 View  11/07/2013   CLINICAL DATA:  Chest pain  EXAM: PORTABLE CHEST - 1 VIEW  COMPARISON:  06/17/2013  FINDINGS: The heart size and mediastinal contours are within normal limits. Both lungs are clear. The visualized skeletal structures are unremarkable.  IMPRESSION: No active disease.   Electronically Signed   By: Lucienne Capers M.D.   On: 11/07/2013 05:08     EKG Interpretation   Date/Time:  Tuesday November 07 2013 04:50:54 EDT Ventricular Rate:  74 PR Interval:  169 QRS Duration: 99 QT Interval:  401 QTC Calculation: 445 R Axis:   -45 Text  Interpretation:  Sinus rhythm LAD, consider left anterior fascicular  block Nonspecific T abnormalities, anterior leads Since previous tracing  QT has shortened, axis has shifted leftward Confirmed by Canary Brim  MD,  MARTHA [phone]) on 11/07/2013 6:28:38 AM      MDM   Final diagnoses:  Chest pain, unspecified chest pain type   43 y.o. F with chest pain, woke from sleep around 0300.  Endorses associated nausea without vomiting.  No SOB,  palpitations, dizziness, weakness.  No prior cardiac hx.  Patient is a daily smoker.  EKG NSR without acute ischemic changes.  Trop negative.  Lab work reassuring.  CXR clear.  Patient given zofran and NTG, states now pain localized to left side of her neck but continues to be constant.  Have ordered morphine.  Work-up negative thus far, however patient does have some RF (DM, smoking) with HEART score of 2.  Will obtain delta trop, if negative anticipate d/c home.  8:55 AM On repeat evaluation, pain still constant and unrelieved with morphine.  Pain now localized to her mid-sternal region again.  Pain seems very atypical in nature as it is moving around her chest. She has had 2 negative troponins in the ED despite constant pain. VS have remained stable, patient is PERC negative without RF for or hx of DVT/PE.  At this time I have low suspicion for ACS, PE, dissection, or other acute cardiac event. Patient is on home Dilaudid as prescribed by pain management, she will continue taking this for pain control. Followup with PCP later this week for re-check.  Discussed plan with patient, he/she acknowledged understanding and agreed with plan of care.  Return precautions given for new or worsening symptoms.  Case discussed with attending physician, Dr. Canary Brim, who agrees with assessment and plan of care.  Larene Pickett, PA-C 11/07/13 1008

## 2013-11-13 NOTE — ED Provider Notes (Signed)
Medical screening examination/treatment/procedure(s) were performed by non-physician practitioner and as supervising physician I was immediately available for consultation/collaboration.   EKG Interpretation   Date/Time:  Tuesday November 07 2013 04:50:54 EDT Ventricular Rate:  74 PR Interval:  169 QRS Duration: 99 QT Interval:  401 QTC Calculation: 445 R Axis:   -45 Text Interpretation:  Sinus rhythm LAD, consider left anterior fascicular  block Nonspecific T abnormalities, anterior leads Since previous tracing  QT has shortened, axis has shifted leftward Confirmed by Canary Brim  MD,  Keirston Saephanh [phone]) on 11/07/2013 6:28:38 AM       Threasa Beards, MD 11/13/13 1504

## 2013-11-14 ENCOUNTER — Telehealth: Payer: Self-pay | Admitting: Internal Medicine

## 2013-11-14 NOTE — Telephone Encounter (Signed)
Pt is calling to obtain refills on the following medications. She has enough till Thursday. Please follow up with pt.   pantoprazole (PROTONIX) 40 MG tablet and glipiZIDE (GLUCOTROL) 5 MG tablet

## 2013-11-16 ENCOUNTER — Telehealth: Payer: Self-pay

## 2013-11-16 DIAGNOSIS — K219 Gastro-esophageal reflux disease without esophagitis: Secondary | ICD-10-CM

## 2013-11-16 DIAGNOSIS — E1110 Type 2 diabetes mellitus with ketoacidosis without coma: Secondary | ICD-10-CM

## 2013-11-16 MED ORDER — GLIPIZIDE 5 MG PO TABS: 5.0000 mg | ORAL_TABLET | Freq: Two times a day (BID) | ORAL | Status: DC

## 2013-11-16 MED ORDER — PANTOPRAZOLE SODIUM 40 MG PO TBEC: 40.0000 mg | DELAYED_RELEASE_TABLET | Freq: Every day | ORAL | Status: DC

## 2013-11-16 NOTE — Telephone Encounter (Signed)
Patient called requesting refill on glipizide and protonix Medications sent to [facility]

## 2013-11-16 NOTE — Telephone Encounter (Signed)
Pt following up on refill for pantoprazole (PROTONIX) 40 MG tablet and glipiZIDE (GLUCOTROL) 5 MG tablet. Pt's last office visit was in August.

## 2013-11-17 ENCOUNTER — Other Ambulatory Visit: Payer: Self-pay | Admitting: Internal Medicine

## 2013-11-17 DIAGNOSIS — Z1231 Encounter for screening mammogram for malignant neoplasm of breast: Secondary | ICD-10-CM

## 2013-11-29 ENCOUNTER — Ambulatory Visit (INDEPENDENT_AMBULATORY_CARE_PROVIDER_SITE_OTHER): Payer: Medicaid Other | Admitting: Diagnostic Neuroimaging

## 2013-11-29 ENCOUNTER — Encounter: Payer: Self-pay | Admitting: Diagnostic Neuroimaging

## 2013-11-29 VITALS — BP 141/87 | HR 99 | Temp 98.7°F

## 2013-11-29 DIAGNOSIS — M79609 Pain in unspecified limb: Secondary | ICD-10-CM

## 2013-11-29 DIAGNOSIS — R531 Weakness: Secondary | ICD-10-CM

## 2013-11-29 DIAGNOSIS — R569 Unspecified convulsions: Secondary | ICD-10-CM

## 2013-11-29 DIAGNOSIS — R5381 Other malaise: Secondary | ICD-10-CM

## 2013-11-29 DIAGNOSIS — R5383 Other fatigue: Secondary | ICD-10-CM

## 2013-11-29 MED ORDER — LEVETIRACETAM 500 MG PO TABS: 1000.0000 mg | ORAL_TABLET | Freq: Two times a day (BID) | ORAL | Status: DC

## 2013-11-29 NOTE — Patient Instructions (Signed)
Increase levetiracetam to 105m twice a day.

## 2013-11-29 NOTE — Progress Notes (Signed)
[facility]  PATIENT: Andrea Wade DOB: [birth year]  REFERRING CLINICIAN:  HISTORY FROM: patient, daughter REASON FOR VISIT: follow up   HISTORICAL  CHIEF COMPLAINT:  Chief Complaint  Patient presents with  . Follow-up    HISTORY OF PRESENT ILLNESS:   UPDATE 11/29/13: Since last visit, sxs are stable. Still with severe, diffuse pain in neck, spine, back, hands, elbows, knees. Tried to walk with walker. Still uses wheelchair. Also with intermittent unexplained chest pain.  Continues to have "seizures", consistenting of dizzy, shaking, sweating, shortness of breath sensation without LOC. She noted her hands and feet "curling up". Reports h/o seizure since childhood.   UPDATE 06/15/13: Since last visit patient continues to have constellation of symptoms including diffuse pain, neck pain, arm, leg, joint pain, muscle spasms in legs. Patient also had recent hospitalization for chest pain and malaise evaluation. Patient is taking Tylenol as needed for pain control. She states that her depression anxiety or not under good control. She is awaiting referral to psychiatry. She tells me that her Medicaid has just been approved. No seizure since last visit.  PRIOR HPI (11/08/12): 43 year old female with diabetes, migraine, depression, anxiety, Crohn's disease, lumbar spine mass, here for evaluation of multiple sclerosis. In June 2014 patient passed out at home patient went to [facility] and was diagnosed with possible seizure. Also patient was diagnosed with possible multiple sclerosis based on MRI. However she did not followup neurology.  July patient was admitted to the hospital for chest pain the event. During this time she had lumbar puncture to further evaluate for multiple sclerosis. Patient presents to this office visit for further evaluation and followup of test results. Also since July 2014 patient has been having constellation of other symptoms including diffuse joint  pain, joint swelling, incontinence, weakness, memory loss, malaise.   REVIEW OF SYSTEMS: Full 14 system review of systems performed and notable for: decreased appetite and chills decreased weight and ringing in ears eye pain eye discharge cough wheezing constipation nausea vomiting rectal pain chest pain tremors.   ALLERGIES: Allergies  Allergen Reactions  . Codeine Hives, Itching and Palpitations  . Peanut-Containing Drug Products Anaphylaxis  . Penicillins Hives, Itching and Nausea And Vomiting    HOME MEDICATIONS: Outpatient Prescriptions Prior to Visit  Medication Sig Dispense Refill  . aspirin EC 81 MG tablet Take 1 tablet (81 mg total) by mouth daily.  60 tablet  2  . baclofen (LIORESAL) 10 MG tablet Take 10 mg by mouth 3 (three) times daily.      . Gabapentin Enacarbil (HORIZANT) 600 MG TBCR Take 600 mg by mouth every evening.      Marland Kitchen glipiZIDE (GLUCOTROL) 5 MG tablet Take 1 tablet (5 mg total) by mouth 2 (two) times daily before a meal.  60 tablet  2  . HYDROmorphone HCl (EXALGO) 12 MG T24A SR tablet Take 12 mg by mouth daily.      . metFORMIN (GLUCOPHAGE) 500 MG tablet Take 1 tablet (500 mg total) by mouth 2 (two) times daily with a meal.  60 tablet  11  . pantoprazole (PROTONIX) 40 MG tablet Take 1 tablet (40 mg total) by mouth daily.  30 tablet  2  . levETIRAcetam (KEPPRA) 500 MG tablet Take 1 tablet (500 mg total) by mouth 2 (two) times daily.  60 tablet  5   No facility-administered medications prior to visit.    PAST MEDICAL HISTORY: Past Medical History  Diagnosis Date  . Diabetes type 2, uncontrolled   .  Crohn disease   . Pneumonia     "about 10 times in my lifetime" (04/17/2013)  . Anemia   . GERD (gastroesophageal reflux disease)   . Daily headache   . Migraine     "q day" (04/17/2013)  . Epileptic     "had 1-2/night; started RX; last one was 01/2014"  . Chronic back pain     "all over" (04/17/2013)  . Anxiety   . Depression   . Arthritis     "joints"  (04/17/2013)    PAST SURGICAL HISTORY: Past Surgical History  Procedure Laterality Date  . Cholecystectomy  1994  . Tubal ligation  1993    FAMILY HISTORY: Family History  Problem Relation Age of Onset  . Diabetes Mother   . Heart disease Father     SOCIAL HISTORY:  History   Social History  . Marital Status: Single    Spouse Name: N/A    Number of Children: 2  . Years of Education: 11   Occupational History  . N/A    Social History Main Topics  . Smoking status: Current Every Day Smoker -- 0.50 packs/day for 21 years    Types: Cigarettes  . Smokeless tobacco: Never Used  . Alcohol Use: No  . Drug Use: No  . Sexual Activity: Not Currently    Partners: Male   Other Topics Concern  . Not on file   Social History Narrative   Patient lives at home with boyfriend.   Caffeine Use: 1/2 gallon tea daily     PHYSICAL EXAM  Filed Vitals:   11/29/13 0835  BP: 141/87  Pulse: 99  Temp: 98.7 F (37.1 C)  TempSrc: Oral    Not recorded    Cannot calculate BMI with a height equal to zero.  GENERAL EXAM:  IN WHEELCHAIR. SLOW RESPONSES. FLAT AFFECT. EDENTULOUS.  CARDIOVASCULAR:  Regular rate and rhythm, no murmurs, no carotid bruits   NEUROLOGIC:  MENTAL STATUS: awake, alert, language fluent, comprehension intact, naming intact  CRANIAL NERVE: pupils equal and reactive to light, visual fields full to confrontation, extraocular muscles intact, no nystagmus, facial sensation and strength symmetric, uvula midline, shoulder shrug symmetric, tongue midline.  MOTOR: DIFFUSE WEAKNESS (BUE 3, BLE 2). LIMITED BY PAIN IN HANDS, FINGERS, LEGS. STIFF, PAINFUL JOINTS IN HANDS SENSORY: ABSENT VIB AT TOES.  COORDINATION: finger-nose-finger, fine finger movements normal  REFLEXES: BUE 2, KNEES 1, ANKLES 0.  GAIT/STATION: IN WHEELCHAIR. CANNOT STAND UNASSISTED.     DIAGNOSTIC DATA (LABS, IMAGING, TESTING) - I reviewed patient records, labs, notes, testing and imaging  myself where available.  Lab Results  Component Value Date   WBC 8.1 11/07/2013   HGB 11.6* 11/07/2013   HCT 34.0* 11/07/2013   MCV 71.0* 11/07/2013   PLT 408* 11/07/2013      Component Value Date/Time   NA 137 11/07/2013 0509   K 3.6* 11/07/2013 0509   CL 101 11/07/2013 0509   CO2 24 11/07/2013 0455   GLUCOSE 244* 11/07/2013 0509   BUN <3* 11/07/2013 0509   CREATININE 0.50 11/07/2013 0509   CALCIUM 8.7 11/07/2013 0455   PROT 7.4 11/07/2013 0455   ALBUMIN 3.0* 11/07/2013 0455   AST 31 11/07/2013 0455   ALT 16 11/07/2013 0455   ALKPHOS 64 11/07/2013 0455   BILITOT 0.3 11/07/2013 0455   GFRNONAA >90 11/07/2013 0455   GFRAA >90 11/07/2013 0455   Lab Results  Component Value Date   CHOL 117 04/18/2013   HDL 22* 04/18/2013  LDLCALC 56 04/18/2013   TRIG 193* 04/18/2013   CHOLHDL 5.3 04/18/2013   Lab Results  Component Value Date   HGBA1C 7.1 10/05/2013   Lab Results  Component Value Date   VITAMINB12 345 10/05/2013   Lab Results  Component Value Date   TSH 1.669 04/17/2013    I reviewed images myself. The brain findings are mild, and not clearly typical for demyelinating disease. Patient's significant tobacco and diabetes history would favor chronic small vessel ischemic disease as the better explanation for the white matter lesions. 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 Concomitant lumbar polyradiculopathy (L5, S1) not excluded (patient deferred lumbar paraspinal EMG evaluation).  07/28/13 EEG - normal   07/04/13 MRI brain (with and without) demonstrating:  1. Mild scattered foci of T2 hyperintensities in the subcortical white matter. No abnormal enhancing lesions. These findings are non-specific and considerations include autoimmune, inflammatory, post-infectious, microvascular ischemic or migraine associated  etiologies.  2. No change from MRI on 08/24/12.  07/04/13 MRI cervical spine (with and without) demonstrating: 1. At C4-5: uncovertebral joint hypertrophy with moderate biforaminal stenosis.  2. At C6-7: uncovertebral joint hypertrophy with mild left foraminal stenosis.  3. No intrinsic or abnormal enhancing spinal cord lesions.  4. No change from MRI on 08/25/12.  07/04/13 MRI thoracic spine (with and without) demonstrating:  1. Mild multi-level disc bulging. No spinal stenosis or foraminal narrowing.  2. No intrinsic or abnormal enhancing spinal cord lesions.  07/04/13 MRI lumbar spine (with and without) demonstrating:  1. At L4-5: disc bulging and facet hypertrophy with moderate right and moderate-severe left foraminal stenosis; potential impingement upon exiting left L4 and descending left L5 roots  2. At L3-4: disc bulging and facet hypertrophy with moderate biforaminal stenosis  3. At L2-3: disc bulging and facet hypertrophy with mild biforaminal stenosis     ASSESSMENT AND PLAN  43 y.o. year old female here with diffuse constellation of symptoms including pain, joint swelling, muscle spasm, headaches, history of seizures, depression, anxiety, insomnia. No clear unifying diagnosis. Patient continues to have poor glycemic control and continues to smoke cigarettes. She appears significantly deconditioned.   PROBLEM LIST: 1. Abnormal MRI brain: this is likely due to chronic small vessel ischemic disease; I do not think patient has multiple sclerosis based on minimal MRI brain findings and negative lumbar puncture.  2. Seizure disorder: description of events are not clear cut and could represent complex partial seizures vs syncope/pre-syncope events; for now will try increased dose of levetiracetam; in future may have to consider VEEG for definitive diagnosis.  3. Diffuse weakness and pain: could represent fibromyalgia or other pain syndrome; would recommend rheumatology evaluation to rule out  other autoimmune/connective tissue disorders. Continue pain management and physical therapy.   PLAN: - increase levetiracetam to 1061m BID - consider rheumatology evaluation (will defer to PCP) - continue pain mgmt  Return in about 6 months (around 05/30/2014).   VPenni Bombard MD 97/70/3403 95:24AM Certified in Neurology, Neurophysiology and Neuroimaging  [facility] [address] [place]ake [place] [phone]

## 2013-12-20 ENCOUNTER — Encounter: Payer: Medicaid Other | Admitting: Obstetrics & Gynecology

## 2013-12-20 ENCOUNTER — Ambulatory Visit (HOSPITAL_COMMUNITY): Payer: Medicaid Other

## 2014-02-15 ENCOUNTER — Other Ambulatory Visit: Payer: Self-pay | Admitting: Internal Medicine

## 2014-02-20 ENCOUNTER — Ambulatory Visit: Payer: Medicaid Other | Admitting: Diagnostic Neuroimaging

## 2014-02-21 ENCOUNTER — Ambulatory Visit (INDEPENDENT_AMBULATORY_CARE_PROVIDER_SITE_OTHER): Payer: Medicaid Other | Admitting: Diagnostic Neuroimaging

## 2014-02-21 ENCOUNTER — Encounter: Payer: Self-pay | Admitting: Diagnostic Neuroimaging

## 2014-02-21 VITALS — BP 126/88 | HR 103 | Temp 99.4°F

## 2014-02-21 DIAGNOSIS — R9089 Other abnormal findings on diagnostic imaging of central nervous system: Secondary | ICD-10-CM

## 2014-02-21 DIAGNOSIS — R930 Abnormal findings on diagnostic imaging of skull and head, not elsewhere classified: Secondary | ICD-10-CM

## 2014-02-21 DIAGNOSIS — R531 Weakness: Secondary | ICD-10-CM

## 2014-02-21 DIAGNOSIS — R55 Syncope and collapse: Secondary | ICD-10-CM

## 2014-02-21 NOTE — Progress Notes (Signed)
[facility]  PATIENT: Andrea Wade DOB: 1970/10/28  REFERRING CLINICIAN:  HISTORY FROM: patient, husband  REASON FOR VISIT: follow up   HISTORICAL  CHIEF COMPLAINT:  Chief Complaint  Patient presents with  . Follow-up    HISTORY OF PRESENT ILLNESS:   UPDATE 02/21/14: Since last visit, still having 2-3 "seizure" type events per month. Tried LEV 1042m BID, but could not tolerate --> increased nausea. Still with seizure events consisting of stiffness, sweating, crying spells, lasting 5 minutes. Also with intermittent "zone out" spells with shaking and tongue protrusion. Still with significant pain in joints.   UPDATE 11/29/13: Since last visit, sxs are stable. Still with severe, diffuse pain in neck, spine, back, hands, elbows, knees. Tried to walk with walker. Still uses wheelchair. Also with intermittent unexplained chest pain.  Continues to have "seizures", consistenting of dizzy, shaking, sweating, shortness of breath sensation without LOC. She noted her hands and feet "curling up". Reports h/o seizure since childhood.   UPDATE 06/15/13: Since last visit patient continues to have constellation of symptoms including diffuse pain, neck pain, arm, leg, joint pain, muscle spasms in legs. Patient also had recent hospitalization for chest pain and malaise evaluation. Patient is taking Tylenol as needed for pain control. She states that her depression anxiety or not under good control. She is awaiting referral to psychiatry. She tells me that her Medicaid has just been approved. No seizure since last visit.  PRIOR HPI (11/08/12): 43year old female with diabetes, migraine, depression, anxiety, Crohn's disease, lumbar spine mass, here for evaluation of multiple sclerosis. In June 2014 patient passed out at [facility]. Also patient was diagnosed with possible multiple sclerosis based on MRI. However she did not  followup neurology.  July patient was admitted to the hospital for chest pain the event. During this time she had lumbar puncture to further evaluate for multiple sclerosis. Patient presents to this office visit for further evaluation and followup of test results. Also since July 2014 patient has been having constellation of other symptoms including diffuse joint pain, joint swelling, incontinence, weakness, memory loss, malaise.   REVIEW OF SYSTEMS: Full 14 system review of systems performed and notable for: decreased appetite and chills decreased weight and ringing in ears eye pain eye discharge cough wheezing constipation nausea vomiting rectal pain chest pain tremors diffuse pain memory loss seizure and speech diff agitation anxiety depression hallucinations trouble swallowing.   ALLERGIES: Allergies  Allergen Reactions  . Codeine Hives, Itching and Palpitations  . Peanut-Containing Drug Products Anaphylaxis  . Penicillins Hives, Itching and Nausea And Vomiting    HOME MEDICATIONS: Outpatient Prescriptions Prior to Visit  Medication Sig Dispense Refill  . aspirin EC 81 MG tablet Take 1 tablet (81 mg total) by mouth daily. 60 tablet 2  . Gabapentin Enacarbil (HORIZANT) 600 MG TBCR Take 600 mg by mouth every morning.     .Marland KitchenglipiZIDE (GLUCOTROL) 5 MG tablet Take 1 tablet (5 mg total) by mouth 2 (two) times daily before a meal. 60 tablet 2  . HYDROmorphone HCl (EXALGO) 12 MG T24A SR tablet Take 24 mg by mouth daily.     .Marland KitchenlevETIRAcetam (KEPPRA) 500 MG tablet Take 2 tablets (1,000 mg total) by mouth 2 (two) times daily. (Patient taking differently: Take 500 mg by mouth 2 (two) times daily. ) 360 tablet 4  . metFORMIN (GLUCOPHAGE) 500 MG tablet Take 1 tablet (500 mg total) by mouth 2 (two) times  daily with a meal. (Patient taking differently: Take 1,000 mg by mouth 2 (two) times daily with a meal. ) 60 tablet 11  . pantoprazole (PROTONIX) 40 MG tablet Take 1 tablet (40 mg total) by mouth daily.  30 tablet 2  . baclofen (LIORESAL) 10 MG tablet Take 10 mg by mouth 3 (three) times daily.     No facility-administered medications prior to visit.    PAST MEDICAL HISTORY: Past Medical History  Diagnosis Date  . Diabetes type 2, uncontrolled   . Crohn disease   . Pneumonia     "about 10 times in my lifetime" (04/17/2013)  . Anemia   . GERD (gastroesophageal reflux disease)   . Daily headache   . Migraine     "q day" (04/17/2013)  . Epileptic     "had 1-2/night; started RX; last one was 01/2014"  . Chronic back pain     "all over" (04/17/2013)  . Anxiety   . Depression   . Arthritis     "joints" (04/17/2013)    PAST SURGICAL HISTORY: Past Surgical History  Procedure Laterality Date  . Cholecystectomy  1994  . Tubal ligation  1993    FAMILY HISTORY: Family History  Problem Relation Age of Onset  . Diabetes Mother   . Heart disease Father     SOCIAL HISTORY:  History   Social History  . Marital Status: Single    Spouse Name: N/A    Number of Children: 2  . Years of Education: 11   Occupational History  . N/A    Social History Main Topics  . Smoking status: Current Every Day Smoker -- 0.50 packs/day for 21 years    Types: Cigarettes  . Smokeless tobacco: Never Used  . Alcohol Use: No  . Drug Use: No  . Sexual Activity:    Partners: Male   Other Topics Concern  . Not on file   Social History Narrative   Patient lives at home with boyfriend.   Caffeine Use: 1/2 gallon tea daily     PHYSICAL EXAM  Filed Vitals:   02/21/14 1356  BP: 126/88  Pulse: 103  Temp: 99.4 F (37.4 C)  TempSrc: Oral    Not recorded      Cannot calculate BMI with a height equal to zero.  GENERAL EXAM:  IN WHEELCHAIR. SLOW RESPONSES. FLAT AFFECT. EDENTULOUS.  CARDIOVASCULAR:  Regular rate and rhythm, no murmurs, no carotid bruits   NEUROLOGIC:  MENTAL STATUS: awake, alert, language fluent, comprehension intact, naming intact  CRANIAL NERVE: pupils equal and  reactive to light, visual fields full to confrontation, extraocular muscles intact, no nystagmus, facial sensation and strength symmetric, uvula midline, shoulder shrug symmetric, tongue midline.  MOTOR: DIFFUSE WEAKNESS (BUE 3, BLE 2). LIMITED BY PAIN IN HANDS, FINGERS, LEGS. STIFF, PAINFUL ENLARGED JOINTS IN HANDS SENSORY: ABSENT VIB AT TOES.  COORDINATION: finger-nose-finger, fine finger movements normal  REFLEXES: BUE 2, KNEES 1, ANKLES 0.  GAIT/STATION: IN WHEELCHAIR. CANNOT STAND UNASSISTED.     DIAGNOSTIC DATA (LABS, IMAGING, TESTING) - I reviewed patient records, labs, notes, testing and imaging myself where available.  Lab Results  Component Value Date   WBC 8.1 11/07/2013   HGB 11.6* 11/07/2013   HCT 34.0* 11/07/2013   MCV 71.0* 11/07/2013   PLT 408* 11/07/2013      Component Value Date/Time   NA 137 11/07/2013 0509   K 3.6* 11/07/2013 0509   CL 101 11/07/2013 0509   CO2 24 11/07/2013 0455  GLUCOSE 244* 11/07/2013 0509   BUN <3* 11/07/2013 0509   CREATININE 0.50 11/07/2013 0509   CALCIUM 8.7 11/07/2013 0455   PROT 7.4 11/07/2013 0455   ALBUMIN 3.0* 11/07/2013 0455   AST 31 11/07/2013 0455   ALT 16 11/07/2013 0455   ALKPHOS 64 11/07/2013 0455   BILITOT 0.3 11/07/2013 0455   GFRNONAA >90 11/07/2013 0455   GFRAA >90 11/07/2013 0455   Lab Results  Component Value Date   CHOL 117 04/18/2013   HDL 22* 04/18/2013   LDLCALC 56 04/18/2013   TRIG 193* 04/18/2013   CHOLHDL 5.3 04/18/2013   Lab Results  Component Value Date   HGBA1C 7.1 10/05/2013   Lab Results  Component Value Date   VITAMINB12 345 10/05/2013   Lab Results  Component Value Date   TSH 1.669 04/17/2013    I reviewed images myself. The brain findings are mild, and not clearly typical for demyelinating disease. Patient's significant tobacco and diabetes history would favor chronic small vessel ischemic disease as the better explanation for the white matter lesions. 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 Concomitant lumbar polyradiculopathy (L5, S1) not excluded (patient deferred lumbar paraspinal EMG evaluation).  07/28/13 EEG - normal   07/04/13 MRI brain (with and without) demonstrating:  1. Mild scattered foci of T2 hyperintensities in the subcortical white matter. No abnormal enhancing lesions. These findings are non-specific and considerations include autoimmune, inflammatory, post-infectious, microvascular ischemic or migraine associated etiologies.  2. No change from MRI on 08/24/12.  07/04/13 MRI cervical spine (with and without) demonstrating: 1. At C4-5: uncovertebral joint hypertrophy with moderate biforaminal stenosis.  2. At C6-7: uncovertebral joint hypertrophy with mild left foraminal stenosis.  3. No intrinsic or abnormal enhancing spinal cord lesions.  4. No change from MRI on 08/25/12.  07/04/13 MRI thoracic spine (with and without) demonstrating:  1. Mild multi-level disc bulging. No spinal stenosis or foraminal narrowing.  2. No intrinsic or abnormal enhancing spinal cord lesions.  07/04/13 MRI lumbar spine (with and without) demonstrating:  1. At L4-5: disc bulging and facet hypertrophy with moderate right and moderate-severe left foraminal stenosis; potential impingement upon exiting left L4 and descending left L5 roots  2. At L3-4: disc bulging and facet hypertrophy with moderate biforaminal stenosis  3. At L2-3: disc bulging and facet hypertrophy with mild biforaminal stenosis     ASSESSMENT AND PLAN  43 y.o. year old female here with diffuse constellation of symptoms including pain, joint swelling, muscle spasm, headaches, history of seizures, depression, anxiety, insomnia. No clear unifying diagnosis. Patient continues to have poor glycemic control and  continues to smoke cigarettes. She appears significantly deconditioned.   PROBLEM LIST: 1. Abnormal MRI brain: this is likely due to chronic small vessel ischemic disease; I do not think patient has multiple sclerosis based on minimal MRI brain findings and negative lumbar puncture.  2. Seizure disorder: description of events are not clear cut and could represent complex partial seizures vs syncope/pre-syncope events; could not tolerate higher LEV dose; will refer for VEEG for definitive diagnosis.  3. Diffuse weakness and pain: could represent fibromyalgia or other pain syndrome or rheumatologic condition; would recommend rheumatology evaluation to rule out other autoimmune/connective tissue disorders. Continue pain management and physical therapy.   PLAN: - continue levetiracetam 594m  BID - VEEG evaluation (refer to [facility]) - agree with rheumatology evaluation (pending per PCP) - continue pain mgmt  Return in about 6 months (around 08/23/2014).   Penni Bombard, MD 05/39/7673, 4:19 PM Certified in Neurology, Neurophysiology and Neuroimaging  [facility] [address] [place], [place] [address] [phone]

## 2014-05-30 ENCOUNTER — Ambulatory Visit: Payer: Medicaid Other | Admitting: Diagnostic Neuroimaging

## 2014-08-27 ENCOUNTER — Other Ambulatory Visit: Payer: Self-pay

## 2014-08-28 ENCOUNTER — Ambulatory Visit: Payer: Medicaid Other | Admitting: Diagnostic Neuroimaging

## 2014-12-07 DIAGNOSIS — G35 Multiple sclerosis: Secondary | ICD-10-CM | POA: Diagnosis not present

## 2014-12-07 DIAGNOSIS — R69 Illness, unspecified: Secondary | ICD-10-CM | POA: Diagnosis not present

## 2014-12-07 DIAGNOSIS — G894 Chronic pain syndrome: Secondary | ICD-10-CM | POA: Diagnosis not present

## 2014-12-07 DIAGNOSIS — M5136 Other intervertebral disc degeneration, lumbar region: Secondary | ICD-10-CM | POA: Diagnosis not present

## 2014-12-07 DIAGNOSIS — M5030 Other cervical disc degeneration, unspecified cervical region: Secondary | ICD-10-CM | POA: Diagnosis not present

## 2014-12-07 DIAGNOSIS — M5134 Other intervertebral disc degeneration, thoracic region: Secondary | ICD-10-CM | POA: Diagnosis not present

## 2014-12-07 DIAGNOSIS — M5416 Radiculopathy, lumbar region: Secondary | ICD-10-CM | POA: Diagnosis not present

## 2014-12-18 DIAGNOSIS — M170 Bilateral primary osteoarthritis of knee: Secondary | ICD-10-CM | POA: Diagnosis not present

## 2014-12-18 DIAGNOSIS — M4716 Other spondylosis with myelopathy, lumbar region: Secondary | ICD-10-CM | POA: Diagnosis not present

## 2015-01-14 DIAGNOSIS — M5136 Other intervertebral disc degeneration, lumbar region: Secondary | ICD-10-CM | POA: Diagnosis not present

## 2015-01-14 DIAGNOSIS — R69 Illness, unspecified: Secondary | ICD-10-CM | POA: Diagnosis not present

## 2015-01-14 DIAGNOSIS — M5030 Other cervical disc degeneration, unspecified cervical region: Secondary | ICD-10-CM | POA: Diagnosis not present

## 2015-01-14 DIAGNOSIS — G894 Chronic pain syndrome: Secondary | ICD-10-CM | POA: Diagnosis not present

## 2015-01-14 DIAGNOSIS — G35 Multiple sclerosis: Secondary | ICD-10-CM | POA: Diagnosis not present

## 2015-01-14 DIAGNOSIS — M5416 Radiculopathy, lumbar region: Secondary | ICD-10-CM | POA: Diagnosis not present

## 2015-01-14 DIAGNOSIS — M5134 Other intervertebral disc degeneration, thoracic region: Secondary | ICD-10-CM | POA: Diagnosis not present

## 2015-01-16 DIAGNOSIS — K580 Irritable bowel syndrome with diarrhea: Secondary | ICD-10-CM | POA: Diagnosis not present

## 2015-01-16 DIAGNOSIS — Z6821 Body mass index (BMI) 21.0-21.9, adult: Secondary | ICD-10-CM | POA: Diagnosis not present

## 2015-01-16 DIAGNOSIS — E1165 Type 2 diabetes mellitus with hyperglycemia: Secondary | ICD-10-CM | POA: Diagnosis not present

## 2015-01-16 DIAGNOSIS — Z6835 Body mass index (BMI) 35.0-35.9, adult: Secondary | ICD-10-CM | POA: Diagnosis not present

## 2015-01-23 DIAGNOSIS — R69 Illness, unspecified: Secondary | ICD-10-CM | POA: Diagnosis not present

## 2015-02-13 DIAGNOSIS — R69 Illness, unspecified: Secondary | ICD-10-CM | POA: Diagnosis not present

## 2015-02-13 DIAGNOSIS — E669 Obesity, unspecified: Secondary | ICD-10-CM | POA: Diagnosis not present

## 2015-02-13 DIAGNOSIS — E119 Type 2 diabetes mellitus without complications: Secondary | ICD-10-CM | POA: Diagnosis not present

## 2015-02-13 DIAGNOSIS — Z6834 Body mass index (BMI) 34.0-34.9, adult: Secondary | ICD-10-CM | POA: Diagnosis not present

## 2015-02-14 DIAGNOSIS — E1165 Type 2 diabetes mellitus with hyperglycemia: Secondary | ICD-10-CM | POA: Diagnosis not present

## 2015-02-18 DIAGNOSIS — G35 Multiple sclerosis: Secondary | ICD-10-CM | POA: Diagnosis not present

## 2015-02-18 DIAGNOSIS — M5030 Other cervical disc degeneration, unspecified cervical region: Secondary | ICD-10-CM | POA: Diagnosis not present

## 2015-02-18 DIAGNOSIS — G894 Chronic pain syndrome: Secondary | ICD-10-CM | POA: Diagnosis not present

## 2015-02-18 DIAGNOSIS — M5134 Other intervertebral disc degeneration, thoracic region: Secondary | ICD-10-CM | POA: Diagnosis not present

## 2015-02-18 DIAGNOSIS — M5416 Radiculopathy, lumbar region: Secondary | ICD-10-CM | POA: Diagnosis not present

## 2015-02-18 DIAGNOSIS — M5136 Other intervertebral disc degeneration, lumbar region: Secondary | ICD-10-CM | POA: Diagnosis not present

## 2015-02-18 DIAGNOSIS — R69 Illness, unspecified: Secondary | ICD-10-CM | POA: Diagnosis not present

## 2015-02-20 NOTE — Telephone Encounter (Signed)
error 

## 2015-03-08 DIAGNOSIS — R112 Nausea with vomiting, unspecified: Secondary | ICD-10-CM | POA: Diagnosis not present

## 2015-03-08 DIAGNOSIS — R55 Syncope and collapse: Secondary | ICD-10-CM | POA: Diagnosis not present

## 2015-03-08 DIAGNOSIS — J9811 Atelectasis: Secondary | ICD-10-CM | POA: Diagnosis not present

## 2015-03-08 DIAGNOSIS — R531 Weakness: Secondary | ICD-10-CM | POA: Diagnosis not present

## 2015-03-08 DIAGNOSIS — R52 Pain, unspecified: Secondary | ICD-10-CM | POA: Diagnosis not present

## 2015-03-08 DIAGNOSIS — M545 Low back pain: Secondary | ICD-10-CM | POA: Diagnosis not present

## 2015-03-08 DIAGNOSIS — R079 Chest pain, unspecified: Secondary | ICD-10-CM | POA: Diagnosis not present

## 2015-03-13 DIAGNOSIS — R69 Illness, unspecified: Secondary | ICD-10-CM | POA: Diagnosis not present

## 2015-03-21 DIAGNOSIS — R69 Illness, unspecified: Secondary | ICD-10-CM | POA: Diagnosis not present

## 2015-03-21 DIAGNOSIS — G894 Chronic pain syndrome: Secondary | ICD-10-CM | POA: Diagnosis not present

## 2015-03-21 DIAGNOSIS — M5416 Radiculopathy, lumbar region: Secondary | ICD-10-CM | POA: Diagnosis not present

## 2015-03-21 DIAGNOSIS — M5134 Other intervertebral disc degeneration, thoracic region: Secondary | ICD-10-CM | POA: Diagnosis not present

## 2015-03-21 DIAGNOSIS — G35 Multiple sclerosis: Secondary | ICD-10-CM | POA: Diagnosis not present

## 2015-03-21 DIAGNOSIS — M5030 Other cervical disc degeneration, unspecified cervical region: Secondary | ICD-10-CM | POA: Diagnosis not present

## 2015-03-21 DIAGNOSIS — M5136 Other intervertebral disc degeneration, lumbar region: Secondary | ICD-10-CM | POA: Diagnosis not present

## 2015-04-02 DIAGNOSIS — Z794 Long term (current) use of insulin: Secondary | ICD-10-CM | POA: Diagnosis not present

## 2015-04-02 DIAGNOSIS — M0579 Rheumatoid arthritis with rheumatoid factor of multiple sites without organ or systems involvement: Secondary | ICD-10-CM | POA: Diagnosis not present

## 2015-04-02 DIAGNOSIS — M2550 Pain in unspecified joint: Secondary | ICD-10-CM | POA: Diagnosis not present

## 2015-04-02 DIAGNOSIS — E1165 Type 2 diabetes mellitus with hyperglycemia: Secondary | ICD-10-CM | POA: Diagnosis not present

## 2015-04-05 DIAGNOSIS — R69 Illness, unspecified: Secondary | ICD-10-CM | POA: Diagnosis not present

## 2015-04-16 DIAGNOSIS — E1165 Type 2 diabetes mellitus with hyperglycemia: Secondary | ICD-10-CM | POA: Diagnosis not present

## 2015-04-16 DIAGNOSIS — Z794 Long term (current) use of insulin: Secondary | ICD-10-CM | POA: Diagnosis not present

## 2015-04-16 DIAGNOSIS — E669 Obesity, unspecified: Secondary | ICD-10-CM | POA: Diagnosis not present

## 2015-04-18 DIAGNOSIS — G8929 Other chronic pain: Secondary | ICD-10-CM | POA: Diagnosis not present

## 2015-04-18 DIAGNOSIS — Z6836 Body mass index (BMI) 36.0-36.9, adult: Secondary | ICD-10-CM | POA: Diagnosis not present

## 2015-04-18 DIAGNOSIS — E1165 Type 2 diabetes mellitus with hyperglycemia: Secondary | ICD-10-CM | POA: Diagnosis not present

## 2015-04-18 DIAGNOSIS — M069 Rheumatoid arthritis, unspecified: Secondary | ICD-10-CM | POA: Diagnosis not present

## 2015-04-18 DIAGNOSIS — K219 Gastro-esophageal reflux disease without esophagitis: Secondary | ICD-10-CM | POA: Diagnosis not present

## 2015-04-22 DIAGNOSIS — M5136 Other intervertebral disc degeneration, lumbar region: Secondary | ICD-10-CM | POA: Diagnosis not present

## 2015-04-22 DIAGNOSIS — M5416 Radiculopathy, lumbar region: Secondary | ICD-10-CM | POA: Diagnosis not present

## 2015-04-22 DIAGNOSIS — R69 Illness, unspecified: Secondary | ICD-10-CM | POA: Diagnosis not present

## 2015-04-22 DIAGNOSIS — M5134 Other intervertebral disc degeneration, thoracic region: Secondary | ICD-10-CM | POA: Diagnosis not present

## 2015-04-22 DIAGNOSIS — G894 Chronic pain syndrome: Secondary | ICD-10-CM | POA: Diagnosis not present

## 2015-04-22 DIAGNOSIS — M5030 Other cervical disc degeneration, unspecified cervical region: Secondary | ICD-10-CM | POA: Diagnosis not present

## 2015-04-22 DIAGNOSIS — G35 Multiple sclerosis: Secondary | ICD-10-CM | POA: Diagnosis not present

## 2015-05-03 DIAGNOSIS — R69 Illness, unspecified: Secondary | ICD-10-CM | POA: Diagnosis not present

## 2015-05-15 DIAGNOSIS — E1165 Type 2 diabetes mellitus with hyperglycemia: Secondary | ICD-10-CM | POA: Diagnosis not present

## 2015-05-16 DIAGNOSIS — E1165 Type 2 diabetes mellitus with hyperglycemia: Secondary | ICD-10-CM | POA: Diagnosis not present

## 2015-05-22 DIAGNOSIS — M5136 Other intervertebral disc degeneration, lumbar region: Secondary | ICD-10-CM | POA: Diagnosis not present

## 2015-05-22 DIAGNOSIS — M5134 Other intervertebral disc degeneration, thoracic region: Secondary | ICD-10-CM | POA: Diagnosis not present

## 2015-05-22 DIAGNOSIS — R69 Illness, unspecified: Secondary | ICD-10-CM | POA: Diagnosis not present

## 2015-05-22 DIAGNOSIS — G35 Multiple sclerosis: Secondary | ICD-10-CM | POA: Diagnosis not present

## 2015-05-22 DIAGNOSIS — M5416 Radiculopathy, lumbar region: Secondary | ICD-10-CM | POA: Diagnosis not present

## 2015-05-22 DIAGNOSIS — G894 Chronic pain syndrome: Secondary | ICD-10-CM | POA: Diagnosis not present

## 2015-05-22 DIAGNOSIS — Z720 Tobacco use: Secondary | ICD-10-CM | POA: Diagnosis not present

## 2015-05-22 DIAGNOSIS — M5030 Other cervical disc degeneration, unspecified cervical region: Secondary | ICD-10-CM | POA: Diagnosis not present

## 2015-05-28 DIAGNOSIS — Z6834 Body mass index (BMI) 34.0-34.9, adult: Secondary | ICD-10-CM | POA: Diagnosis not present

## 2015-05-28 DIAGNOSIS — Z794 Long term (current) use of insulin: Secondary | ICD-10-CM | POA: Diagnosis not present

## 2015-05-28 DIAGNOSIS — E669 Obesity, unspecified: Secondary | ICD-10-CM | POA: Diagnosis not present

## 2015-05-28 DIAGNOSIS — E1165 Type 2 diabetes mellitus with hyperglycemia: Secondary | ICD-10-CM | POA: Diagnosis not present

## 2015-05-28 DIAGNOSIS — R809 Proteinuria, unspecified: Secondary | ICD-10-CM | POA: Diagnosis not present

## 2015-05-28 DIAGNOSIS — E1129 Type 2 diabetes mellitus with other diabetic kidney complication: Secondary | ICD-10-CM | POA: Diagnosis not present

## 2015-05-31 DIAGNOSIS — M79662 Pain in left lower leg: Secondary | ICD-10-CM | POA: Diagnosis not present

## 2015-05-31 DIAGNOSIS — R079 Chest pain, unspecified: Secondary | ICD-10-CM | POA: Diagnosis not present

## 2015-05-31 DIAGNOSIS — R0602 Shortness of breath: Secondary | ICD-10-CM | POA: Diagnosis not present

## 2015-05-31 DIAGNOSIS — R0789 Other chest pain: Secondary | ICD-10-CM | POA: Diagnosis not present

## 2015-05-31 DIAGNOSIS — J40 Bronchitis, not specified as acute or chronic: Secondary | ICD-10-CM | POA: Diagnosis not present

## 2015-05-31 DIAGNOSIS — R69 Illness, unspecified: Secondary | ICD-10-CM | POA: Diagnosis not present

## 2015-06-19 DIAGNOSIS — G35 Multiple sclerosis: Secondary | ICD-10-CM | POA: Diagnosis not present

## 2015-06-19 DIAGNOSIS — M5134 Other intervertebral disc degeneration, thoracic region: Secondary | ICD-10-CM | POA: Diagnosis not present

## 2015-06-19 DIAGNOSIS — M5030 Other cervical disc degeneration, unspecified cervical region: Secondary | ICD-10-CM | POA: Diagnosis not present

## 2015-06-19 DIAGNOSIS — M5136 Other intervertebral disc degeneration, lumbar region: Secondary | ICD-10-CM | POA: Diagnosis not present

## 2015-06-19 DIAGNOSIS — R69 Illness, unspecified: Secondary | ICD-10-CM | POA: Diagnosis not present

## 2015-06-19 DIAGNOSIS — M5416 Radiculopathy, lumbar region: Secondary | ICD-10-CM | POA: Diagnosis not present

## 2015-06-19 DIAGNOSIS — G894 Chronic pain syndrome: Secondary | ICD-10-CM | POA: Diagnosis not present

## 2015-06-19 DIAGNOSIS — Z720 Tobacco use: Secondary | ICD-10-CM | POA: Diagnosis not present

## 2015-06-20 DIAGNOSIS — M1990 Unspecified osteoarthritis, unspecified site: Secondary | ICD-10-CM | POA: Insufficient documentation

## 2015-07-03 DIAGNOSIS — R69 Illness, unspecified: Secondary | ICD-10-CM | POA: Diagnosis not present

## 2015-07-17 DIAGNOSIS — G894 Chronic pain syndrome: Secondary | ICD-10-CM | POA: Diagnosis not present

## 2015-07-17 DIAGNOSIS — G35 Multiple sclerosis: Secondary | ICD-10-CM | POA: Diagnosis not present

## 2015-07-17 DIAGNOSIS — M5134 Other intervertebral disc degeneration, thoracic region: Secondary | ICD-10-CM | POA: Diagnosis not present

## 2015-07-17 DIAGNOSIS — Z720 Tobacco use: Secondary | ICD-10-CM | POA: Diagnosis not present

## 2015-07-17 DIAGNOSIS — M5416 Radiculopathy, lumbar region: Secondary | ICD-10-CM | POA: Diagnosis not present

## 2015-07-17 DIAGNOSIS — R69 Illness, unspecified: Secondary | ICD-10-CM | POA: Diagnosis not present

## 2015-07-17 DIAGNOSIS — M5030 Other cervical disc degeneration, unspecified cervical region: Secondary | ICD-10-CM | POA: Diagnosis not present

## 2015-07-17 DIAGNOSIS — M5136 Other intervertebral disc degeneration, lumbar region: Secondary | ICD-10-CM | POA: Diagnosis not present

## 2015-08-05 DIAGNOSIS — R69 Illness, unspecified: Secondary | ICD-10-CM | POA: Diagnosis not present

## 2015-08-08 DIAGNOSIS — G40909 Epilepsy, unspecified, not intractable, without status epilepticus: Secondary | ICD-10-CM | POA: Diagnosis not present

## 2015-08-08 DIAGNOSIS — I209 Angina pectoris, unspecified: Secondary | ICD-10-CM | POA: Diagnosis not present

## 2015-08-08 DIAGNOSIS — Z794 Long term (current) use of insulin: Secondary | ICD-10-CM | POA: Diagnosis not present

## 2015-08-08 DIAGNOSIS — E1140 Type 2 diabetes mellitus with diabetic neuropathy, unspecified: Secondary | ICD-10-CM | POA: Diagnosis not present

## 2015-08-08 DIAGNOSIS — M25511 Pain in right shoulder: Secondary | ICD-10-CM | POA: Diagnosis not present

## 2015-08-08 DIAGNOSIS — Z0000 Encounter for general adult medical examination without abnormal findings: Secondary | ICD-10-CM | POA: Diagnosis not present

## 2015-08-08 DIAGNOSIS — K210 Gastro-esophageal reflux disease with esophagitis: Secondary | ICD-10-CM | POA: Diagnosis not present

## 2015-08-08 DIAGNOSIS — M7551 Bursitis of right shoulder: Secondary | ICD-10-CM | POA: Diagnosis not present

## 2015-08-08 DIAGNOSIS — G43009 Migraine without aura, not intractable, without status migrainosus: Secondary | ICD-10-CM | POA: Diagnosis not present

## 2015-08-08 DIAGNOSIS — R69 Illness, unspecified: Secondary | ICD-10-CM | POA: Diagnosis not present

## 2015-08-08 DIAGNOSIS — G35 Multiple sclerosis: Secondary | ICD-10-CM | POA: Diagnosis not present

## 2015-08-13 DIAGNOSIS — E1165 Type 2 diabetes mellitus with hyperglycemia: Secondary | ICD-10-CM | POA: Diagnosis not present

## 2015-08-14 DIAGNOSIS — R69 Illness, unspecified: Secondary | ICD-10-CM | POA: Diagnosis not present

## 2015-08-14 DIAGNOSIS — M5030 Other cervical disc degeneration, unspecified cervical region: Secondary | ICD-10-CM | POA: Diagnosis not present

## 2015-08-14 DIAGNOSIS — Z720 Tobacco use: Secondary | ICD-10-CM | POA: Diagnosis not present

## 2015-08-14 DIAGNOSIS — G894 Chronic pain syndrome: Secondary | ICD-10-CM | POA: Diagnosis not present

## 2015-08-14 DIAGNOSIS — Z1389 Encounter for screening for other disorder: Secondary | ICD-10-CM | POA: Diagnosis not present

## 2015-08-14 DIAGNOSIS — M5416 Radiculopathy, lumbar region: Secondary | ICD-10-CM | POA: Diagnosis not present

## 2015-08-14 DIAGNOSIS — G35 Multiple sclerosis: Secondary | ICD-10-CM | POA: Diagnosis not present

## 2015-08-14 DIAGNOSIS — E119 Type 2 diabetes mellitus without complications: Secondary | ICD-10-CM | POA: Diagnosis not present

## 2015-08-14 DIAGNOSIS — M5136 Other intervertebral disc degeneration, lumbar region: Secondary | ICD-10-CM | POA: Diagnosis not present

## 2015-08-14 DIAGNOSIS — M5134 Other intervertebral disc degeneration, thoracic region: Secondary | ICD-10-CM | POA: Diagnosis not present

## 2015-09-02 DIAGNOSIS — M25511 Pain in right shoulder: Secondary | ICD-10-CM | POA: Diagnosis not present

## 2015-09-02 DIAGNOSIS — M7541 Impingement syndrome of right shoulder: Secondary | ICD-10-CM | POA: Diagnosis not present

## 2015-09-06 DIAGNOSIS — G40909 Epilepsy, unspecified, not intractable, without status epilepticus: Secondary | ICD-10-CM | POA: Diagnosis not present

## 2015-09-06 DIAGNOSIS — G5731 Lesion of lateral popliteal nerve, right lower limb: Secondary | ICD-10-CM | POA: Diagnosis not present

## 2015-09-06 DIAGNOSIS — G43809 Other migraine, not intractable, without status migrainosus: Secondary | ICD-10-CM | POA: Diagnosis not present

## 2015-09-06 DIAGNOSIS — R269 Unspecified abnormalities of gait and mobility: Secondary | ICD-10-CM | POA: Diagnosis not present

## 2015-09-11 DIAGNOSIS — M5030 Other cervical disc degeneration, unspecified cervical region: Secondary | ICD-10-CM | POA: Diagnosis not present

## 2015-09-11 DIAGNOSIS — Z720 Tobacco use: Secondary | ICD-10-CM | POA: Diagnosis not present

## 2015-09-11 DIAGNOSIS — E119 Type 2 diabetes mellitus without complications: Secondary | ICD-10-CM | POA: Diagnosis not present

## 2015-09-11 DIAGNOSIS — G894 Chronic pain syndrome: Secondary | ICD-10-CM | POA: Diagnosis not present

## 2015-09-11 DIAGNOSIS — R69 Illness, unspecified: Secondary | ICD-10-CM | POA: Diagnosis not present

## 2015-09-11 DIAGNOSIS — G35 Multiple sclerosis: Secondary | ICD-10-CM | POA: Diagnosis not present

## 2015-09-11 DIAGNOSIS — M5416 Radiculopathy, lumbar region: Secondary | ICD-10-CM | POA: Diagnosis not present

## 2015-09-11 DIAGNOSIS — M5134 Other intervertebral disc degeneration, thoracic region: Secondary | ICD-10-CM | POA: Diagnosis not present

## 2015-09-11 DIAGNOSIS — M5136 Other intervertebral disc degeneration, lumbar region: Secondary | ICD-10-CM | POA: Diagnosis not present

## 2015-09-13 DIAGNOSIS — R69 Illness, unspecified: Secondary | ICD-10-CM | POA: Diagnosis not present

## 2015-10-06 ENCOUNTER — Inpatient Hospital Stay (HOSPITAL_COMMUNITY)
Admission: EM | Admit: 2015-10-06 | Discharge: 2015-10-08 | DRG: 313 | Disposition: A | Discharge status: Home / Self Care | Payer: Medicare HMO | Attending: Internal Medicine | Admitting: Internal Medicine

## 2015-10-06 ENCOUNTER — Encounter (HOSPITAL_COMMUNITY): Payer: Self-pay | Admitting: *Deleted

## 2015-10-06 ENCOUNTER — Emergency Department (HOSPITAL_COMMUNITY): Payer: Medicare HMO

## 2015-10-06 DIAGNOSIS — Z833 Family history of diabetes mellitus: Secondary | ICD-10-CM

## 2015-10-06 DIAGNOSIS — D509 Iron deficiency anemia, unspecified: Secondary | ICD-10-CM | POA: Diagnosis present

## 2015-10-06 DIAGNOSIS — K219 Gastro-esophageal reflux disease without esophagitis: Secondary | ICD-10-CM | POA: Diagnosis present

## 2015-10-06 DIAGNOSIS — Z91010 Allergy to peanuts: Secondary | ICD-10-CM

## 2015-10-06 DIAGNOSIS — R079 Chest pain, unspecified: Secondary | ICD-10-CM | POA: Diagnosis present

## 2015-10-06 DIAGNOSIS — G40909 Epilepsy, unspecified, not intractable, without status epilepticus: Secondary | ICD-10-CM | POA: Diagnosis present

## 2015-10-06 DIAGNOSIS — M1990 Unspecified osteoarthritis, unspecified site: Secondary | ICD-10-CM | POA: Diagnosis present

## 2015-10-06 DIAGNOSIS — Z7982 Long term (current) use of aspirin: Secondary | ICD-10-CM

## 2015-10-06 DIAGNOSIS — Z720 Tobacco use: Secondary | ICD-10-CM | POA: Diagnosis present

## 2015-10-06 DIAGNOSIS — Z723 Lack of physical exercise: Secondary | ICD-10-CM

## 2015-10-06 DIAGNOSIS — F17210 Nicotine dependence, cigarettes, uncomplicated: Secondary | ICD-10-CM | POA: Diagnosis present

## 2015-10-06 DIAGNOSIS — R55 Syncope and collapse: Secondary | ICD-10-CM | POA: Diagnosis present

## 2015-10-06 DIAGNOSIS — Z880 Allergy status to penicillin: Secondary | ICD-10-CM

## 2015-10-06 DIAGNOSIS — G894 Chronic pain syndrome: Secondary | ICD-10-CM | POA: Diagnosis present

## 2015-10-06 DIAGNOSIS — F329 Major depressive disorder, single episode, unspecified: Secondary | ICD-10-CM | POA: Diagnosis present

## 2015-10-06 DIAGNOSIS — E119 Type 2 diabetes mellitus without complications: Secondary | ICD-10-CM

## 2015-10-06 DIAGNOSIS — E118 Type 2 diabetes mellitus with unspecified complications: Secondary | ICD-10-CM | POA: Diagnosis not present

## 2015-10-06 DIAGNOSIS — E876 Hypokalemia: Secondary | ICD-10-CM

## 2015-10-06 DIAGNOSIS — Z6836 Body mass index (BMI) 36.0-36.9, adult: Secondary | ICD-10-CM

## 2015-10-06 DIAGNOSIS — N920 Excessive and frequent menstruation with regular cycle: Secondary | ICD-10-CM | POA: Diagnosis present

## 2015-10-06 DIAGNOSIS — Z8249 Family history of ischemic heart disease and other diseases of the circulatory system: Secondary | ICD-10-CM

## 2015-10-06 DIAGNOSIS — R0789 Other chest pain: Principal | ICD-10-CM | POA: Diagnosis present

## 2015-10-06 DIAGNOSIS — G35 Multiple sclerosis: Secondary | ICD-10-CM | POA: Diagnosis present

## 2015-10-06 DIAGNOSIS — Z79891 Long term (current) use of opiate analgesic: Secondary | ICD-10-CM

## 2015-10-06 DIAGNOSIS — E785 Hyperlipidemia, unspecified: Secondary | ICD-10-CM | POA: Diagnosis present

## 2015-10-06 DIAGNOSIS — E878 Other disorders of electrolyte and fluid balance, not elsewhere classified: Secondary | ICD-10-CM | POA: Diagnosis not present

## 2015-10-06 DIAGNOSIS — Z885 Allergy status to narcotic agent status: Secondary | ICD-10-CM

## 2015-10-06 DIAGNOSIS — E871 Hypo-osmolality and hyponatremia: Secondary | ICD-10-CM | POA: Diagnosis not present

## 2015-10-06 DIAGNOSIS — Z794 Long term (current) use of insulin: Secondary | ICD-10-CM

## 2015-10-06 DIAGNOSIS — E669 Obesity, unspecified: Secondary | ICD-10-CM | POA: Diagnosis present

## 2015-10-06 LAB — CBC
HCT: 32.7 % — ABNORMAL LOW (ref 36.0–46.0)
Hemoglobin: 9.7 g/dL — ABNORMAL LOW (ref 12.0–15.0)
MCH: 20.2 pg — AB (ref 26.0–34.0)
MCHC: 29.7 g/dL — AB (ref 30.0–36.0)
MCV: 68 fL — AB (ref 78.0–100.0)
PLATELETS: 364 10*3/uL (ref 150–400)
RBC: 4.81 MIL/uL (ref 3.87–5.11)
RDW: 19.4 % — ABNORMAL HIGH (ref 11.5–15.5)
WBC: 11.7 10*3/uL — ABNORMAL HIGH (ref 4.0–10.5)

## 2015-10-06 LAB — URINE MICROSCOPIC-ADD ON
Bacteria, UA: NONE SEEN
WBC UA: NONE SEEN WBC/hpf (ref 0–5)

## 2015-10-06 LAB — CBC WITH DIFFERENTIAL/PLATELET
BASOS ABS: 0 10*3/uL (ref 0.0–0.1)
Basophils Relative: 0 %
EOS ABS: 0.3 10*3/uL (ref 0.0–0.7)
Eosinophils Relative: 2 %
HEMATOCRIT: 34.1 % — AB (ref 36.0–46.0)
Hemoglobin: 10.1 g/dL — ABNORMAL LOW (ref 12.0–15.0)
LYMPHS PCT: 20 %
Lymphs Abs: 3 10*3/uL (ref 0.7–4.0)
MCH: 20.2 pg — AB (ref 26.0–34.0)
MCHC: 29.6 g/dL — ABNORMAL LOW (ref 30.0–36.0)
MCV: 68.1 fL — AB (ref 78.0–100.0)
Monocytes Absolute: 1.2 10*3/uL — ABNORMAL HIGH (ref 0.1–1.0)
Monocytes Relative: 8 %
Neutro Abs: 10.3 10*3/uL — ABNORMAL HIGH (ref 1.7–7.7)
Neutrophils Relative %: 70 %
PLATELETS: 405 10*3/uL — AB (ref 150–400)
RBC: 5.01 MIL/uL (ref 3.87–5.11)
RDW: 19.4 % — ABNORMAL HIGH (ref 11.5–15.5)
WBC: 14.8 10*3/uL — ABNORMAL HIGH (ref 4.0–10.5)

## 2015-10-06 LAB — URINALYSIS, ROUTINE W REFLEX MICROSCOPIC
BILIRUBIN URINE: NEGATIVE
KETONES UR: NEGATIVE mg/dL
LEUKOCYTES UA: NEGATIVE
Nitrite: NEGATIVE
PH: 5.5 (ref 5.0–8.0)
Protein, ur: NEGATIVE mg/dL
Specific Gravity, Urine: 1.033 — ABNORMAL HIGH (ref 1.005–1.030)

## 2015-10-06 LAB — GLUCOSE, CAPILLARY
GLUCOSE-CAPILLARY: 167 mg/dL — AB (ref 65–99)
Glucose-Capillary: 111 mg/dL — ABNORMAL HIGH (ref 65–99)

## 2015-10-06 LAB — CREATININE, SERUM
CREATININE: 0.41 mg/dL — AB (ref 0.44–1.00)
GFR calc Af Amer: >60 mL/min (ref >60)
GFR calc non Af Amer: >60 mL/min (ref >60)

## 2015-10-06 LAB — I-STAT CHEM 8, ED
BUN: <3 mg/dL — ABNORMAL LOW (ref 6–20)
CALCIUM ION: 1.09 mmol/L — AB (ref 1.13–1.30)
CHLORIDE: 89 mmol/L — AB (ref 101–111)
Creatinine, Ser: 0.4 mg/dL — ABNORMAL LOW (ref 0.44–1.00)
GLUCOSE: 160 mg/dL — AB (ref 65–99)
HCT: 36 % (ref 36.0–46.0)
Hemoglobin: 12.2 g/dL (ref 12.0–15.0)
Potassium: 3.4 mmol/L — ABNORMAL LOW (ref 3.5–5.1)
Sodium: 129 mmol/L — ABNORMAL LOW (ref 135–145)
TCO2: 25 mmol/L (ref 0–100)

## 2015-10-06 LAB — BASIC METABOLIC PANEL
ANION GAP: 11 (ref 5–15)
BUN: <5 mg/dL — ABNORMAL LOW (ref 6–20)
CHLORIDE: 90 mmol/L — AB (ref 101–111)
CO2: 25 mmol/L (ref 22–32)
Calcium: 8.6 mg/dL — ABNORMAL LOW (ref 8.9–10.3)
Creatinine, Ser: 0.49 mg/dL (ref 0.44–1.00)
GFR calc Af Amer: >60 mL/min (ref >60)
GFR calc non Af Amer: >60 mL/min (ref >60)
Glucose, Bld: 158 mg/dL — ABNORMAL HIGH (ref 65–99)
Potassium: 3.3 mmol/L — ABNORMAL LOW (ref 3.5–5.1)
Sodium: 126 mmol/L — ABNORMAL LOW (ref 135–145)

## 2015-10-06 LAB — CBG MONITORING, ED: Glucose-Capillary: 101 mg/dL — ABNORMAL HIGH (ref 65–99)

## 2015-10-06 LAB — POC OCCULT BLOOD, ED: Fecal Occult Bld: NEGATIVE

## 2015-10-06 LAB — TROPONIN I
Troponin I: <0.03 ng/mL (ref <0.03)
Troponin I: <0.03 ng/mL (ref <0.03)

## 2015-10-06 LAB — I-STAT BETA HCG BLOOD, ED (MC, WL, AP ONLY): I-stat hCG, quantitative: <5 m[IU]/mL (ref <5)

## 2015-10-06 LAB — I-STAT TROPONIN, ED: Troponin i, poc: 0.01 ng/mL (ref 0.00–0.08)

## 2015-10-06 MED ORDER — ACETAMINOPHEN 325 MG PO TABS
650.0000 mg | ORAL_TABLET | Freq: Four times a day (QID) | ORAL | Status: DC | PRN
  Administered 2015-10-07 – 2015-10-08 (×2): 650 mg via ORAL
  Filled 2015-10-06 (×2): qty 2

## 2015-10-06 MED ORDER — TAPENTADOL HCL ER 100 MG PO TB12: 100.0000 mg | ORAL_TABLET | Freq: Two times a day (BID) | ORAL | Status: DC

## 2015-10-06 MED ORDER — TAPENTADOL HCL 50 MG PO TABS
50.0000 mg | ORAL_TABLET | Freq: Every day | ORAL | Status: DC
  Filled 2015-10-06: qty 1

## 2015-10-06 MED ORDER — ONDANSETRON HCL 4 MG/2ML IJ SOLN
4.0000 mg | Freq: Three times a day (TID) | INTRAMUSCULAR | Status: AC | PRN
Start: 2015-10-06 — End: 2015-10-06

## 2015-10-06 MED ORDER — GABAPENTIN ENACARBIL ER 600 MG PO TBCR: 600.0000 mg | EXTENDED_RELEASE_TABLET | Freq: Every morning | ORAL | Status: DC

## 2015-10-06 MED ORDER — SODIUM CHLORIDE 0.9 % IV BOLUS (SEPSIS)
1000.0000 mL | Freq: Once | INTRAVENOUS | Status: AC
  Administered 2015-10-06: 1000 mL via INTRAVENOUS

## 2015-10-06 MED ORDER — POTASSIUM CHLORIDE CRYS ER 20 MEQ PO TBCR
40.0000 meq | EXTENDED_RELEASE_TABLET | Freq: Once | ORAL | Status: AC
  Administered 2015-10-06: 40 meq via ORAL
  Filled 2015-10-06: qty 2

## 2015-10-06 MED ORDER — IOPAMIDOL (ISOVUE-370) INJECTION 76%
INTRAVENOUS | Status: AC
  Administered 2015-10-06: 100 mL
  Filled 2015-10-06: qty 100

## 2015-10-06 MED ORDER — ACETAMINOPHEN 650 MG RE SUPP: 650.0000 mg | Freq: Four times a day (QID) | RECTAL | Status: DC | PRN

## 2015-10-06 MED ORDER — INSULIN DETEMIR 100 UNIT/ML ~~LOC~~ SOLN: 40.0000 [IU] | Freq: Every day | SUBCUTANEOUS | Status: DC

## 2015-10-06 MED ORDER — MORPHINE SULFATE (PF) 4 MG/ML IV SOLN
4.0000 mg | Freq: Once | INTRAVENOUS | Status: AC
  Administered 2015-10-06: 4 mg via INTRAVENOUS
  Filled 2015-10-06: qty 1

## 2015-10-06 MED ORDER — DULOXETINE HCL 30 MG PO CPEP
30.0000 mg | ORAL_CAPSULE | Freq: Every day | ORAL | Status: DC
  Administered 2015-10-06 – 2015-10-08 (×3): 30 mg via ORAL
  Filled 2015-10-06 (×3): qty 1

## 2015-10-06 MED ORDER — LIDOCAINE-PRILOCAINE 2.5-2.5 % EX CREA
1.0000 "application " | TOPICAL_CREAM | Freq: Every day | CUTANEOUS | Status: DC | PRN
  Filled 2015-10-06: qty 5

## 2015-10-06 MED ORDER — TRAZODONE HCL 50 MG PO TABS: 50.0000 mg | ORAL_TABLET | Freq: Every evening | ORAL | Status: DC | PRN

## 2015-10-06 MED ORDER — ONDANSETRON HCL 4 MG/2ML IJ SOLN: 4.0000 mg | Freq: Four times a day (QID) | INTRAMUSCULAR | Status: DC | PRN

## 2015-10-06 MED ORDER — PROMETHAZINE HCL 25 MG PO TABS
12.5000 mg | ORAL_TABLET | Freq: Two times a day (BID) | ORAL | Status: DC
  Administered 2015-10-06 – 2015-10-08 (×5): 12.5 mg via ORAL
  Filled 2015-10-06 (×5): qty 1

## 2015-10-06 MED ORDER — ASPIRIN EC 81 MG PO TBEC
81.0000 mg | DELAYED_RELEASE_TABLET | Freq: Every day | ORAL | Status: DC
  Administered 2015-10-06 – 2015-10-08 (×3): 81 mg via ORAL
  Filled 2015-10-06 (×3): qty 1

## 2015-10-06 MED ORDER — MORPHINE SULFATE (PF) 4 MG/ML IV SOLN
4.0000 mg | INTRAVENOUS | Status: DC | PRN
  Administered 2015-10-06 – 2015-10-07 (×4): 4 mg via INTRAVENOUS
  Filled 2015-10-06 (×4): qty 1

## 2015-10-06 MED ORDER — HEPARIN SODIUM (PORCINE) 5000 UNIT/ML IJ SOLN
5000.0000 [IU] | Freq: Three times a day (TID) | INTRAMUSCULAR | Status: DC
  Administered 2015-10-06: 5000 [IU] via SUBCUTANEOUS
  Filled 2015-10-06 (×2): qty 1

## 2015-10-06 MED ORDER — ALBUTEROL SULFATE (2.5 MG/3ML) 0.083% IN NEBU: 2.5000 mg | INHALATION_SOLUTION | RESPIRATORY_TRACT | Status: DC | PRN

## 2015-10-06 MED ORDER — SODIUM CHLORIDE 0.9% FLUSH
3.0000 mL | Freq: Two times a day (BID) | INTRAVENOUS | Status: DC
  Administered 2015-10-07: 3 mL via INTRAVENOUS

## 2015-10-06 MED ORDER — NORTRIPTYLINE HCL 25 MG PO CAPS
25.0000 mg | ORAL_CAPSULE | Freq: Every day | ORAL | Status: DC
  Administered 2015-10-06 – 2015-10-07 (×2): 25 mg via ORAL
  Filled 2015-10-06 (×2): qty 1

## 2015-10-06 MED ORDER — BACLOFEN 20 MG PO TABS
20.0000 mg | ORAL_TABLET | Freq: Three times a day (TID) | ORAL | Status: DC
  Administered 2015-10-06 – 2015-10-08 (×7): 20 mg via ORAL
  Filled 2015-10-06 (×7): qty 1

## 2015-10-06 MED ORDER — PANTOPRAZOLE SODIUM 40 MG PO TBEC
40.0000 mg | DELAYED_RELEASE_TABLET | Freq: Every day | ORAL | Status: DC
  Administered 2015-10-06 – 2015-10-08 (×3): 40 mg via ORAL
  Filled 2015-10-06 (×3): qty 1

## 2015-10-06 MED ORDER — SODIUM CHLORIDE 0.9 % IV SOLN
Freq: Once | INTRAVENOUS | Status: AC
  Administered 2015-10-06: 75 mL/h via INTRAVENOUS

## 2015-10-06 MED ORDER — ATORVASTATIN CALCIUM 40 MG PO TABS
40.0000 mg | ORAL_TABLET | Freq: Every day | ORAL | Status: DC
  Administered 2015-10-06 – 2015-10-08 (×3): 40 mg via ORAL
  Filled 2015-10-06 (×3): qty 1

## 2015-10-06 MED ORDER — KETOROLAC TROMETHAMINE 30 MG/ML IJ SOLN
30.0000 mg | Freq: Once | INTRAMUSCULAR | Status: AC
  Administered 2015-10-06: 30 mg via INTRAVENOUS
  Filled 2015-10-06: qty 1

## 2015-10-06 MED ORDER — SODIUM CHLORIDE 0.9 % IV SOLN
INTRAVENOUS | Status: DC
  Administered 2015-10-06 – 2015-10-07 (×2): via INTRAVENOUS

## 2015-10-06 MED ORDER — POLYETHYLENE GLYCOL 3350 17 G PO PACK
17.0000 g | PACK | Freq: Every day | ORAL | Status: DC | PRN
  Filled 2015-10-06: qty 1

## 2015-10-06 MED ORDER — GABAPENTIN 300 MG PO CAPS
300.0000 mg | ORAL_CAPSULE | Freq: Two times a day (BID) | ORAL | Status: DC
  Administered 2015-10-06 – 2015-10-08 (×5): 300 mg via ORAL
  Filled 2015-10-06 (×5): qty 1

## 2015-10-06 MED ORDER — INSULIN ASPART 100 UNIT/ML ~~LOC~~ SOLN
0.0000 [IU] | Freq: Three times a day (TID) | SUBCUTANEOUS | Status: DC
  Administered 2015-10-07: 1 [IU] via SUBCUTANEOUS
  Administered 2015-10-07: 2 [IU] via SUBCUTANEOUS
  Administered 2015-10-08: 1 [IU] via SUBCUTANEOUS
  Administered 2015-10-08: 2 [IU] via SUBCUTANEOUS
  Administered 2015-10-08: 1 [IU] via SUBCUTANEOUS

## 2015-10-06 MED ORDER — NITROGLYCERIN 2 % TD OINT
1.0000 [in_us] | TOPICAL_OINTMENT | Freq: Three times a day (TID) | TRANSDERMAL | Status: AC
  Administered 2015-10-06 (×2): 1 [in_us] via TOPICAL
  Filled 2015-10-06 (×2): qty 1

## 2015-10-06 MED ORDER — SENNA 8.6 MG PO TABS
1.0000 | ORAL_TABLET | Freq: Two times a day (BID) | ORAL | Status: DC
  Administered 2015-10-06 – 2015-10-08 (×3): 8.6 mg via ORAL
  Filled 2015-10-06 (×5): qty 1

## 2015-10-06 MED ORDER — ONDANSETRON HCL 4 MG PO TABS: 4.0000 mg | ORAL_TABLET | Freq: Four times a day (QID) | ORAL | Status: DC | PRN

## 2015-10-06 MED ORDER — POTASSIUM CHLORIDE 10 MEQ/100ML IV SOLN
10.0000 meq | INTRAVENOUS | Status: AC
  Administered 2015-10-06 (×2): 10 meq via INTRAVENOUS
  Filled 2015-10-06 (×2): qty 100

## 2015-10-06 MED ORDER — METOPROLOL TARTRATE 25 MG PO TABS
25.0000 mg | ORAL_TABLET | Freq: Two times a day (BID) | ORAL | Status: DC
  Administered 2015-10-06 – 2015-10-08 (×5): 25 mg via ORAL
  Filled 2015-10-06 (×5): qty 1

## 2015-10-06 MED ORDER — INSULIN DETEMIR 100 UNIT/ML ~~LOC~~ SOLN
40.0000 [IU] | Freq: Every day | SUBCUTANEOUS | Status: DC
  Administered 2015-10-06: 20 [IU] via SUBCUTANEOUS
  Administered 2015-10-07: 40 [IU] via SUBCUTANEOUS
  Filled 2015-10-06 (×4): qty 0.4

## 2015-10-06 MED ORDER — TAPENTADOL HCL 50 MG PO TABS
75.0000 mg | ORAL_TABLET | Freq: Three times a day (TID) | ORAL | Status: DC
  Administered 2015-10-06 – 2015-10-08 (×7): 75 mg via ORAL
  Filled 2015-10-06 (×7): qty 2
  Filled 2015-10-06: qty 1
  Filled 2015-10-06: qty 2

## 2015-10-06 MED ORDER — SODIUM CHLORIDE 0.9% FLUSH: 3.0000 mL | INTRAVENOUS | Status: DC | PRN

## 2015-10-06 MED ORDER — SODIUM CHLORIDE 0.9 % IV SOLN: 250.0000 mL | INTRAVENOUS | Status: DC | PRN

## 2015-10-06 MED ORDER — ASPIRIN EC 81 MG PO TBEC: 81.0000 mg | DELAYED_RELEASE_TABLET | Freq: Every day | ORAL | Status: DC

## 2015-10-06 MED ORDER — ONDANSETRON HCL 4 MG/2ML IJ SOLN
4.0000 mg | Freq: Once | INTRAMUSCULAR | Status: AC
  Administered 2015-10-06: 4 mg via INTRAVENOUS

## 2015-10-06 NOTE — ED Notes (Signed)
Attempted report 

## 2015-10-06 NOTE — ED Notes (Signed)
Patient returned from CT

## 2015-10-06 NOTE — ED Triage Notes (Signed)
Patient presents via EMS from home with c/o mid sternal CP that started while she was sitting watching TV.  Stated the pain went around the right side to her back.  EMS adm 3 NTG, 324 ASA, Zofran 30m IV

## 2015-10-06 NOTE — ED Notes (Signed)
Nucynta 25 mg wasted with RN Rolene Arbour

## 2015-10-06 NOTE — ED Provider Notes (Signed)
[facility] DEPT Provider Note   CSN: [id number] Arrival date & time: 10/06/15  4503  First Provider Contact:  First MD Initiated Contact with Patient 10/06/15 [phone]        History   Chief Complaint Chief Complaint  Patient presents with  . Chest Pain    HPI Andrea Wade is a 45 y.o. female.  Andrea Wade is a 45 y.o. female  with a hx of MS, anemia, arthritic, chronic back pain, Crohn's disease, IDDM, migraine headaches presents to the Emergency Department complaining of gradual, persistent, progressively worsening left sided chest pain onset 39mn ago while watching TV.  She reports the pain is severe, stabbing and radiating into the left arm, jaw and left leg.  Associated symptoms include SOB, nausea without vomiting.  Pt reports no diaphoresis.  Pt denies personal hx of cardiac disease but reports strong family hx of CAD in both parents in their 458s  Pt denies hx of DVT, estrogen, recent surgeries, recent fractures, leg swelling, immobilization.  Nothing makes it better and nothing makes it worse.  Pt was given ASA and nitro by EMS which did not change the pain.  Pt denies fever, chills, headache, abd pain, V/D, weakness, dizziness, syncope.       The history is provided by the patient and medical records. No language interpreter was used.    Past Medical History:  Diagnosis Date  . Anemia   . Anxiety   . Arthritis    "joints" (04/17/2013)  . Chronic back pain    "all over" (04/17/2013)  . Crohn disease ([facility]   . Daily headache   . Depression   . Diabetes type 2, uncontrolled ([facility]   . Epileptic ([facility]    "had 1-2/night; started RX; last one was 01/2014"  . GERD (gastroesophageal reflux disease)   . Migraine    "q day" (04/17/2013)  . Pneumonia    "about 10 times in my lifetime" (04/17/2013)    Patient Active Problem List   Diagnosis Date Noted  . Diabetes mellitus due to underlying condition without complications ([facility] 080/05/4915 . Anemia, unspecified  10/05/2013  . Hypokalemia 10/05/2013  . Smoking 10/05/2013  . Menorrhagia with regular cycle 10/05/2013  . Esophageal reflux 10/05/2013  . Chest pain 04/17/2013  . Seizure ([facility] 02/24/2013  . Atypical chest pain 09/22/2012  . Noncompliance 09/22/2012  . Tobacco abuse 09/22/2012  . DM2 (diabetes mellitus, type 2) ([facility] 09/22/2012  . Protein-calorie malnutrition, severe ([facility] 09/22/2012    Past Surgical History:  Procedure Laterality Date  . CHOLECYSTECTOMY  1994  . TUBAL LIGATION  1993    OB History    No data available       Home Medications    Prior to Admission medications   Medication Sig Start Date End Date Taking? Authorizing Provider  aspirin EC 81 MG tablet Take 1 tablet (81 mg total) by mouth daily. 05/02/13  Yes NReyne Dumas MD  baclofen (LIORESAL) 20 MG tablet Take 20 mg by mouth every 8 (eight) hours.   Yes Historical Provider, MD  Canagliflozin-Metformin HCl (INVOKAMET) [phone] MG TABS Take 1 tablet by mouth 2 (two) times daily.   Yes Historical Provider, MD  DULoxetine (CYMBALTA) 30 MG capsule Take 30 mg by mouth daily.   Yes Historical Provider, MD  Gabapentin Enacarbil (HORIZANT) 600 MG TBCR Take 600 mg by mouth every morning.    Yes Historical Provider, MD  insulin aspart (NOVOLOG FLEXPEN) 100 UNIT/ML FlexPen Inject 14-28 Units into the skin 3 (  three) times daily with meals.   Yes Historical Provider, MD  insulin detemir (LEVEMIR) 100 unit/ml SOLN Inject 40 Units into the skin at bedtime.   Yes Historical Provider, MD  lidocaine-prilocaine (EMLA) cream Apply 1 application topically daily as needed (pain).   Yes Historical Provider, MD  nortriptyline (PAMELOR) 25 MG capsule Take 25 mg by mouth at bedtime.   Yes Historical Provider, MD  pantoprazole (PROTONIX) 40 MG tablet Take 1 tablet (40 mg total) by mouth daily. 11/16/13  Yes Lorayne Marek, MD  promethazine (PHENERGAN) 12.5 MG tablet Take 12.5 mg by mouth 2 (two) times daily.   Yes Historical Provider, MD    tapentadol (NUCYNTA ER) 100 MG 12 hr tablet Take 100 mg by mouth every 12 (twelve) hours.   Yes Historical Provider, MD  tapentadol (NUCYNTA) 50 MG tablet Take 50 mg by mouth daily.   Yes Historical Provider, MD    Family History Family History  Problem Relation Age of Onset  . Diabetes Mother   . Heart disease Father     Social History Social History  Substance Use Topics  . Smoking status: Current Every Day Smoker    Packs/day: 0.50    Years: 21.00    Types: Cigarettes  . Smokeless tobacco: Never Used  . Alcohol use No     Allergies   Codeine; Peanut-containing drug products; and Penicillins   Review of Systems Review of Systems  Constitutional: Negative for diaphoresis.  Respiratory: Positive for shortness of breath. Negative for chest tightness.   Cardiovascular: Positive for chest pain.  Gastrointestinal: Positive for nausea.  Musculoskeletal: Positive for arthralgias and back pain.  All other systems reviewed and are negative.    Physical Exam Updated Vital Signs BP 126/72   Pulse 73   Temp 98.1 F (36.7 C) (Oral)   Resp 16   SpO2 92%   Physical Exam  Constitutional: She appears well-developed and well-nourished. No distress.  Awake, alert, nontoxic appearance Uncomfortable appearing  HENT:  Head: Normocephalic and atraumatic.  Mouth/Throat: Oropharynx is clear and moist. No oropharyngeal exudate.  Eyes: Conjunctivae are normal. No scleral icterus.  Neck: Normal range of motion. Neck supple.  Cardiovascular: Normal rate, regular rhythm and intact distal pulses.   Pulmonary/Chest: Effort normal and breath sounds normal. No respiratory distress. She has no wheezes.  Equal chest expansion  Abdominal: Soft. Bowel sounds are normal. She exhibits no mass. There is no tenderness. There is no rebound and no guarding.  Musculoskeletal: Normal range of motion. She exhibits no edema.  Neurological: She is alert.  Speech is clear and goal oriented Moves  extremities without ataxia  Skin: Skin is warm and dry. She is not diaphoretic.  Psychiatric: She has a normal mood and affect.  Nursing note and vitals reviewed.    ED Treatments / Results  Labs (all labs ordered are listed, but only abnormal results are displayed) Labs Reviewed  CBC WITH DIFFERENTIAL/PLATELET - Abnormal; Notable for the following:       Result Value   WBC 14.8 (*)    Hemoglobin 10.1 (*)    HCT 34.1 (*)    MCV 68.1 (*)    MCH 20.2 (*)    MCHC 29.6 (*)    RDW 19.4 (*)    Platelets 405 (*)    Neutro Abs 10.3 (*)    Monocytes Absolute 1.2 (*)    All other components within normal limits  BASIC METABOLIC PANEL - Abnormal; Notable for the following:  Sodium 126 (*)    Potassium 3.3 (*)    Chloride 90 (*)    Glucose, Bld 158 (*)    BUN <5 (*)    Calcium 8.6 (*)    All other components within normal limits  I-STAT CHEM 8, ED - Abnormal; Notable for the following:    Sodium 129 (*)    Potassium 3.4 (*)    Chloride 89 (*)    BUN <3 (*)    Creatinine, Ser 0.40 (*)    Glucose, Bld 160 (*)    Calcium, Ion 1.09 (*)    All other components within normal limits  URINALYSIS, ROUTINE W REFLEX MICROSCOPIC (NOT AT [facility])  I-STAT BETA HCG BLOOD, ED ([facility], [facility], [facility] ONLY)  I-STAT TROPOININ, ED  I-STAT TROPOININ, ED    EKG  EKG Interpretation  Date/Time:  Sunday October 06 2015 04:33:23 EDT Ventricular Rate:  79 PR Interval:    QRS Duration: 108 QT Interval:  445 QTC Calculation: 511 R Axis:   -16 Text Interpretation:  Sinus rhythm Incomplete RBBB and LAFB RSR' in V1 or V2, probably normal variant Prolonged QT interval No significant change since last tracing Confirmed by WARD,  DO, KRISTEN [phone]) on 10/06/2015 4:37:45 AM       Radiology Ct Angio Chest Pe W Or Wo Contrast  Result Date: 10/06/2015 CLINICAL DATA:  44 year old female with left-sided chest pain radiating to the back. EXAM: CT ANGIOGRAPHY CHEST WITH CONTRAST TECHNIQUE: Multidetector CT imaging of the  chest was performed using the standard protocol during bolus administration of intravenous contrast. Multiplanar CT image reconstructions and MIPs were obtained to evaluate the vascular anatomy. CONTRAST:  100 cc Isovue 370 COMPARISON:  Chest CT dated 05/31/2015 FINDINGS: There is mild diffuse airspace haziness of the upper lung as well as minimal bibasilar subpleural densities most consistent with atelectatic changes. There is no focal consolidation, pleural effusion, or pneumothorax. The central airways are patent. There is mildly dilated appearance of the ascending aorta, likely related to motion artifact. There is no evidence of aortic dissection. Evaluation of the pulmonary arteries is limited due to suboptimal opacification of the peripheral branches and streak artifact caused by patient's arms. No definite pulmonary artery embolus identified. Top-normal cardiac size. No pericardial effusion. There is no hilar or mediastinal adenopathy. The esophagus is grossly unremarkable. No thyroid nodules identified. There is no axillary adenopathy. The chest wall soft tissues appear unremarkable. T8 hemangioma. The osseous structures are intact. The visualized upper abdomen appears unremarkable. Review of the MIP images confirms the above findings. IMPRESSION: No CT evidence of central pulmonary artery embolus. Electronically Signed   By: Anner Crete M.D.   On: 10/06/2015 06:22    Procedures Procedures (including critical care time)  Medications Ordered in ED Medications  potassium chloride 10 mEq in 100 mL IVPB (10 mEq Intravenous New Bag/Given 10/06/15 0606)  sodium chloride 0.9 % bolus 1,000 mL (0 mLs Intravenous Stopped 10/06/15 0600)  ketorolac (TORADOL) 30 MG/ML injection 30 mg (30 mg Intravenous Given 10/06/15 0505)  iopamidol (ISOVUE-370) 76 % injection (100 mLs  Contrast Given 10/06/15 0500)  morphine 4 MG/ML injection 4 mg (4 mg Intravenous Given 10/06/15 0603)  ondansetron (ZOFRAN) injection 4 mg (4 mg  Intravenous Given 10/06/15 0612)     Initial Impression / Assessment and Plan / ED Course  I have reviewed the triage vital signs and the nursing notes.  Pertinent labs & imaging results that were available during my care of the patient were reviewed  by me and considered in my medical decision making (see chart for details).  Clinical Course  Value Comment By Time   She continues to have some chest pain. Jarrett Soho Niquan Charnley, PA-C 08/06 3382  Sodium: (!) 126 Sodium, potassium and chloride are low. Normal anion gap.  Leukocytosis noted along with anemia.  Initial troponin negative. Abigail Butts, PA-C 08/06 5053  EKG 12-Lead Unchanged Abigail Butts, PA-C 08/06 9767  CT ANGIO CHEST PE W OR WO CONTRAST No PE or evidence of dissection. Jarrett Soho Meleah Demeyer, PA-C 08/06 [phone]  Pulse Rate: 73 No tachycardia, hypotension or fever. Abigail Butts, PA-C 08/06 [phone]   Discussed with Dr. Myna Hidalgo who will admit to tele, obs.   Jarrett Soho Calise Dunckel, PA-C 08/06 [phone]   Pt with Chest pain. EKG unchanged from previous. Negative troponin. CT scan of chest without evidence of PE or dissection.  Hyponatremia, hypochloremia and hypokalemia. Patient will be admitted for IV fluids and chest pain rule out.  The patient was discussed with and seen by Dr. Claudine Mouton who agrees with the treatment plan.   Final Clinical Impressions(s) / ED Diagnoses   Final diagnoses:  Chest pain, unspecified chest pain type  Hyponatremia  Hypokalemia  Hypochloremia    New Prescriptions New Prescriptions   No medications on file     Abigail Butts, PA-C 10/06/15 [id number]    Everlene Balls, MD 10/06/15 [phone]

## 2015-10-06 NOTE — ED Notes (Signed)
Witnessed waste of [employer] 75m, in sharps in med room pod B with BLewis Shock RN.

## 2015-10-06 NOTE — H&P (Signed)
Patient Demographics:    Andrea Wade, is a 45 y.o. female  MRN: [id number]   DOB - [birth year]  Admit Date - 10/06/2015  Outpatient Primary MD for the patient is No primary care provider on file.   Assessment & Plan:    Principal Problem:   Atypical chest pain Active Problems:   Tobacco abuse   DM2 (diabetes mellitus, type 2) (HCC)    1)Atypical Chest Pain-  Cardiovascular risk factors include Sedentary lifestyle, obesity, diabetes, tobacco use disorder and family history of premature CAD in first-degree relatives,   Refer to  Observation status on  telemetry monitored unit, check serial troponins and EKG for rule out acute coronary syndrome .  If patient rules out for ACS, we will consider stress in am , if stress test if positive then please get Cardiology consult.  Give aspirin, topical Nitropaste and metoprolol 25 mg twice a day along with Lipitor . Patient had a nuclear stress test in February 2015 without reversible ischemia, however there was a question of possible fixed defect around the apex, EF on gated images from that particular stress test was around 60%. CTA chest is negative for PE  2)DM-continue Levemir insulin 40 units daily, Use Novolog/Humalog Sliding scale insulin with Accu-Cheks/Fingersticks as ordered   3) tobacco use disorder-smoking cessation strongly advised, patient is not ready to set a quit date  4) chronic pain syndrome-continue Nucynta, Cymbalta,  Baclofen, nortriptyline and Gabapentin  5)Leukocytosis- etiology unclear, UA is pending, CTA chest without infectious type of finding in the chest, cultures are pending, hold off on antibiotics for now, leukocytosis may be reactive  6)FEN-hypokalemia and hyponatremia noted, patient denies vomiting and diarrhea, give IV normal saline and  replace potassium orally and IV, recheck lytes in a.m.  7)Anemia-microcytic and hypochromic, most likely secondary to menorrhagia, LMP 3 weeks ago with heavy clots, check stool for occult blood, c/n protonix, follow-up with PCP for pelvic ultrasound and further evaluation of menorrhagia. Baseline hemoglobin around 11, hemoglobin today is 10.1  With History of - Reviewed by me  Past Medical History:  Diagnosis Date  . Anemia   . Anxiety   . Arthritis    "joints" (04/17/2013)  . Chronic back pain    "all over" (04/17/2013)  . Crohn disease ([place])   . Daily headache   . Depression   . Diabetes type 2, uncontrolled ([place])   . Epileptic ([place])    "had 1-2/night; started RX; last one was 01/2014"  . GERD (gastroesophageal reflux disease)   . Migraine    "q day" (04/17/2013)  . MS (multiple sclerosis) ([place])   . Pneumonia    "about 10 times in my lifetime" (04/17/2013)      Past Surgical History:  Procedure Laterality Date  . CHOLECYSTECTOMY  1994  . [place]      Chief Complaint  Patient presents with  . Chest Pain  HPI:    Andrea Wade  is a 45 y.o. female, With past medical history relevant for diabetes, obesity and family history of premature CAD in multiple first degree relatives who presents to the ED with left-sided chest discomfort radiating to her left arm and apparently left leg and jaw. Chest discomfort started less than an hour prior to arrival to the ED, it was associated with nausea and shortness of breath but no emesis. No leg pains no pleuritic symptoms, no fever no chills no productive cough she is a smoker. No palpitations no dizziness no diaphoresis. Please note the patient had a nuclear stress test in February 2015 without reversible ischemia. Chest discomfort was described as dull, retracted about 7/10 in intensity, did not get better with rest did not get worse with activity. No diarrhea    Review of systems:    In addition to the HPI above,     A full 12 point Review of Systems was done, except as stated above, all other Review of Systems were negative.    Social History:  Reviewed by me    Social History  Substance Use Topics  . Smoking status: Current Every Day Smoker    Packs/day: 0.50    Years: 21.00    Types: Cigarettes  . Smokeless tobacco: Never Used  . Alcohol use No       Family History :  Reviewed by me    Family History  Problem Relation Age of Onset  . Diabetes Mother   . Heart disease Father      Home Medications:   Prior to Admission medications   Medication Sig Start Date End Date Taking? Authorizing Provider  aspirin EC 81 MG tablet Take 1 tablet (81 mg total) by mouth daily. 05/02/13  Yes Reyne Dumas, MD  baclofen (LIORESAL) 20 MG tablet Take 20 mg by mouth every 8 (eight) hours.   Yes Historical Provider, MD  Canagliflozin-Metformin HCl (INVOKAMET) 2342228915 MG TABS Take 1 tablet by mouth 2 (two) times daily.   Yes Historical Provider, MD  DULoxetine (CYMBALTA) 30 MG capsule Take 30 mg by mouth daily.   Yes Historical Provider, MD  Gabapentin Enacarbil (HORIZANT) 600 MG TBCR Take 600 mg by mouth every morning.    Yes Historical Provider, MD  insulin aspart (NOVOLOG FLEXPEN) 100 UNIT/ML FlexPen Inject 14-28 Units into the skin 3 (three) times daily with meals.   Yes Historical Provider, MD  insulin detemir (LEVEMIR) 100 unit/ml SOLN Inject 40 Units into the skin at bedtime.   Yes Historical Provider, MD  lidocaine-prilocaine (EMLA) cream Apply 1 application topically daily as needed (pain).   Yes Historical Provider, MD  nortriptyline (PAMELOR) 25 MG capsule Take 25 mg by mouth at bedtime.   Yes Historical Provider, MD  pantoprazole (PROTONIX) 40 MG tablet Take 1 tablet (40 mg total) by mouth daily. 11/16/13  Yes Lorayne Marek, MD  promethazine (PHENERGAN) 12.5 MG tablet Take 12.5 mg by mouth 2 (two) times daily.   Yes Historical Provider, MD  tapentadol (NUCYNTA ER) 100 MG 12 hr tablet Take 100  mg by mouth every 12 (twelve) hours.   Yes Historical Provider, MD  tapentadol (NUCYNTA) 50 MG tablet Take 50 mg by mouth daily.   Yes Historical Provider, MD     Allergies:     Allergies  Allergen Reactions  . Codeine Hives, Itching and Palpitations  . Peanut-Containing Drug Products Anaphylaxis  . Penicillins Hives, Itching and Nausea And Vomiting     Physical  Exam:   Vitals  Blood pressure 111/75, pulse 77, temperature 98.1 F (36.7 C), temperature source Oral, resp. rate 17, SpO2 91 %.  Physical Examination: General appearance - alert, Overweight appearing, and in no distress  Mental status - alert, oriented to person, place, and time, somewhat flat affect Eyes - sclera anicteric Neck - supple, no JVD elevation , Chest - clear  to auscultation bilaterally, symmetrical air movement,  Heart - S1 and S2 normal,  Abdomen - soft, nontender, nondistended, no masses or organomegaly Neurological - screening mental status exam normal, neck supple without rigidity, cranial nerves II through XII intact, DTR's normal and symmetric Extremities - trace pedal edema noted, intact peripheral pulses, negative Homans Skin - warm, dry    Data Review:    CBC  Recent Labs Lab 10/06/15 0451 10/06/15 0500  WBC 14.8*  --   HGB 10.1* 12.2  HCT 34.1* 36.0  PLT 405*  --   MCV 68.1*  --   MCH 20.2*  --   MCHC 29.6*  --   RDW 19.4*  --   LYMPHSABS 3.0  --   MONOABS 1.2*  --   EOSABS 0.3  --   BASOSABS 0.0  --    ------------------------------------------------------------------------------------------------------------------  Chemistries   Recent Labs Lab 10/06/15 0451 10/06/15 0500  NA 126* 129*  K 3.3* 3.4*  CL 90* 89*  CO2 25  --   GLUCOSE 158* 160*  BUN <5* <3*  CREATININE 0.49 0.40*  CALCIUM 8.6*  --    ------------------------------------------------------------------------------------------------------------------ CrCl cannot be calculated (Unknown ideal  weight.). ------------------------------------------------------------------------------------------------------------------ No results for input(s): TSH, T4TOTAL, T3FREE, THYROIDAB in the last 72 hours.  Invalid input(s): FREET3   Coagulation profile No results for input(s): INR, PROTIME in the last 168 hours. ------------------------------------------------------------------------------------------------------------------- No results for input(s): DDIMER in the last 72 hours. -------------------------------------------------------------------------------------------------------------------  Cardiac Enzymes No results for input(s): CKMB, TROPONINI, MYOGLOBIN in the last 168 hours.  Invalid input(s): CK ------------------------------------------------------------------------------------------------------------------ No results found for: BNP   ---------------------------------------------------------------------------------------------------------------  Urinalysis    Component Value Date/Time   COLORURINE YELLOW 07/22/2013 1703   APPEARANCEUR CLOUDY (A) 07/22/2013 1703   LABSPEC 1.008 07/22/2013 1703   PHURINE 6.5 07/22/2013 1703   GLUCOSEU NEGATIVE 07/22/2013 1703   HGBUR LARGE (A) 07/22/2013 1703   BILIRUBINUR NEGATIVE 07/22/2013 1703   KETONESUR NEGATIVE 07/22/2013 1703   PROTEINUR NEGATIVE 07/22/2013 1703   UROBILINOGEN 0.2 07/22/2013 1703   NITRITE NEGATIVE 07/22/2013 1703   LEUKOCYTESUR NEGATIVE 07/22/2013 1703    ----------------------------------------------------------------------------------------------------------------   Imaging Results:    Ct Angio Chest Pe W Or Wo Contrast  Result Date: 10/06/2015 CLINICAL DATA:  45 year old female with left-sided chest pain radiating to the back. EXAM: CT ANGIOGRAPHY CHEST WITH CONTRAST TECHNIQUE: Multidetector CT imaging of the chest was performed using the standard protocol during bolus administration of intravenous  contrast. Multiplanar CT image reconstructions and MIPs were obtained to evaluate the vascular anatomy. CONTRAST:  100 cc Isovue 370 COMPARISON:  Chest CT dated 05/31/2015 FINDINGS: There is mild diffuse airspace haziness of the upper lung as well as minimal bibasilar subpleural densities most consistent with atelectatic changes. There is no focal consolidation, pleural effusion, or pneumothorax. The central airways are patent. There is mildly dilated appearance of the ascending aorta, likely related to motion artifact. There is no evidence of aortic dissection. Evaluation of the pulmonary arteries is limited due to suboptimal opacification of the peripheral branches and streak artifact caused by patient's arms. No definite pulmonary artery embolus identified. Top-normal  cardiac size. No pericardial effusion. There is no hilar or mediastinal adenopathy. The esophagus is grossly unremarkable. No thyroid nodules identified. There is no axillary adenopathy. The chest wall soft tissues appear unremarkable. T8 hemangioma. The osseous structures are intact. The visualized upper abdomen appears unremarkable. Review of the MIP images confirms the above findings. IMPRESSION: No CT evidence of central pulmonary artery embolus. Electronically Signed   By: Anner Crete M.D.   On: 10/06/2015 06:22    Radiological Exams on Admission: Ct Angio Chest Pe W Or Wo Contrast  Result Date: 10/06/2015 CLINICAL DATA:  45 year old female with left-sided chest pain radiating to the back. EXAM: CT ANGIOGRAPHY CHEST WITH CONTRAST TECHNIQUE: Multidetector CT imaging of the chest was performed using the standard protocol during bolus administration of intravenous contrast. Multiplanar CT image reconstructions and MIPs were obtained to evaluate the vascular anatomy. CONTRAST:  100 cc Isovue 370 COMPARISON:  Chest CT dated 05/31/2015 FINDINGS: There is mild diffuse airspace haziness of the upper lung as well as minimal bibasilar  subpleural densities most consistent with atelectatic changes. There is no focal consolidation, pleural effusion, or pneumothorax. The central airways are patent. There is mildly dilated appearance of the ascending aorta, likely related to motion artifact. There is no evidence of aortic dissection. Evaluation of the pulmonary arteries is limited due to suboptimal opacification of the peripheral branches and streak artifact caused by patient's arms. No definite pulmonary artery embolus identified. Top-normal cardiac size. No pericardial effusion. There is no hilar or mediastinal adenopathy. The esophagus is grossly unremarkable. No thyroid nodules identified. There is no axillary adenopathy. The chest wall soft tissues appear unremarkable. T8 hemangioma. The osseous structures are intact. The visualized upper abdomen appears unremarkable. Review of the MIP images confirms the above findings. IMPRESSION: No CT evidence of central pulmonary artery embolus. Electronically Signed   By: Anner Crete M.D.   On: 10/06/2015 06:22    DVT Prophylaxis SCD  AM Labs Ordered, also please review Full Orders  Family Communication: Admission, patients condition and plan of care including tests being ordered have been discussed with the patient who indicate understanding and agree with the plan   Code Status - Full Code  Likely DC to  home  Condition   stable  Andrea Wade M.D on 10/06/2015 at 8:25 AM   Between 7am to 7pm - Pager - [phone]  After 7pm go to [email or web address] - password [email or web address]  [facility] - Office  [phone]  Dragon dictation system was used to create this note, attempts have been made to correct errors, however presence of uncorrected errors is not a reflection quality of care provided.

## 2015-10-07 ENCOUNTER — Observation Stay (HOSPITAL_COMMUNITY): Payer: Medicare HMO

## 2015-10-07 ENCOUNTER — Encounter (HOSPITAL_COMMUNITY): Payer: Self-pay | Admitting: Cardiology

## 2015-10-07 DIAGNOSIS — G40909 Epilepsy, unspecified, not intractable, without status epilepticus: Secondary | ICD-10-CM | POA: Diagnosis present

## 2015-10-07 DIAGNOSIS — R55 Syncope and collapse: Secondary | ICD-10-CM | POA: Diagnosis present

## 2015-10-07 DIAGNOSIS — E876 Hypokalemia: Secondary | ICD-10-CM | POA: Diagnosis present

## 2015-10-07 DIAGNOSIS — E119 Type 2 diabetes mellitus without complications: Secondary | ICD-10-CM | POA: Diagnosis present

## 2015-10-07 DIAGNOSIS — Z723 Lack of physical exercise: Secondary | ICD-10-CM | POA: Diagnosis not present

## 2015-10-07 DIAGNOSIS — D509 Iron deficiency anemia, unspecified: Secondary | ICD-10-CM | POA: Diagnosis present

## 2015-10-07 DIAGNOSIS — E878 Other disorders of electrolyte and fluid balance, not elsewhere classified: Secondary | ICD-10-CM

## 2015-10-07 DIAGNOSIS — K219 Gastro-esophageal reflux disease without esophagitis: Secondary | ICD-10-CM | POA: Diagnosis present

## 2015-10-07 DIAGNOSIS — R9439 Abnormal result of other cardiovascular function study: Secondary | ICD-10-CM | POA: Diagnosis not present

## 2015-10-07 DIAGNOSIS — N920 Excessive and frequent menstruation with regular cycle: Secondary | ICD-10-CM | POA: Diagnosis present

## 2015-10-07 DIAGNOSIS — F17210 Nicotine dependence, cigarettes, uncomplicated: Secondary | ICD-10-CM | POA: Diagnosis present

## 2015-10-07 DIAGNOSIS — Z7982 Long term (current) use of aspirin: Secondary | ICD-10-CM | POA: Diagnosis not present

## 2015-10-07 DIAGNOSIS — R079 Chest pain, unspecified: Secondary | ICD-10-CM

## 2015-10-07 DIAGNOSIS — Z833 Family history of diabetes mellitus: Secondary | ICD-10-CM | POA: Diagnosis not present

## 2015-10-07 DIAGNOSIS — Z8249 Family history of ischemic heart disease and other diseases of the circulatory system: Secondary | ICD-10-CM | POA: Diagnosis not present

## 2015-10-07 DIAGNOSIS — E871 Hypo-osmolality and hyponatremia: Secondary | ICD-10-CM

## 2015-10-07 DIAGNOSIS — E669 Obesity, unspecified: Secondary | ICD-10-CM | POA: Diagnosis present

## 2015-10-07 DIAGNOSIS — E118 Type 2 diabetes mellitus with unspecified complications: Secondary | ICD-10-CM | POA: Diagnosis not present

## 2015-10-07 DIAGNOSIS — G894 Chronic pain syndrome: Secondary | ICD-10-CM | POA: Diagnosis present

## 2015-10-07 DIAGNOSIS — Z6836 Body mass index (BMI) 36.0-36.9, adult: Secondary | ICD-10-CM | POA: Diagnosis not present

## 2015-10-07 DIAGNOSIS — I519 Heart disease, unspecified: Secondary | ICD-10-CM

## 2015-10-07 DIAGNOSIS — E785 Hyperlipidemia, unspecified: Secondary | ICD-10-CM | POA: Diagnosis present

## 2015-10-07 DIAGNOSIS — Z794 Long term (current) use of insulin: Secondary | ICD-10-CM | POA: Diagnosis not present

## 2015-10-07 DIAGNOSIS — F329 Major depressive disorder, single episode, unspecified: Secondary | ICD-10-CM | POA: Diagnosis present

## 2015-10-07 DIAGNOSIS — R0789 Other chest pain: Secondary | ICD-10-CM | POA: Diagnosis not present

## 2015-10-07 DIAGNOSIS — Z720 Tobacco use: Secondary | ICD-10-CM | POA: Diagnosis not present

## 2015-10-07 DIAGNOSIS — M1990 Unspecified osteoarthritis, unspecified site: Secondary | ICD-10-CM | POA: Diagnosis present

## 2015-10-07 DIAGNOSIS — R69 Illness, unspecified: Secondary | ICD-10-CM | POA: Diagnosis not present

## 2015-10-07 DIAGNOSIS — G35 Multiple sclerosis: Secondary | ICD-10-CM | POA: Diagnosis present

## 2015-10-07 DIAGNOSIS — Z885 Allergy status to narcotic agent status: Secondary | ICD-10-CM | POA: Diagnosis not present

## 2015-10-07 LAB — NM MYOCAR MULTI W/SPECT W/WALL MOTION / EF
CHL CUP MPHR: 174 {beats}/min
CHL CUP RESTING HR STRESS: 76 {beats}/min
CSEPEDS: 0 s
CSEPEW: 1 METS
CSEPHR: 55 %
CSEPPHR: 96 {beats}/min
Exercise duration (min): 0 min
RPE: 0

## 2015-10-07 LAB — GLUCOSE, CAPILLARY
GLUCOSE-CAPILLARY: 191 mg/dL — AB (ref 65–99)
Glucose-Capillary: 151 mg/dL — ABNORMAL HIGH (ref 65–99)
Glucose-Capillary: 147 mg/dL — ABNORMAL HIGH (ref 65–99)
Glucose-Capillary: 178 mg/dL — ABNORMAL HIGH (ref 65–99)

## 2015-10-07 LAB — URINE CULTURE: SPECIAL REQUESTS: NORMAL

## 2015-10-07 LAB — CBC
HEMATOCRIT: 35.6 % — AB (ref 36.0–46.0)
Hemoglobin: 10.4 g/dL — ABNORMAL LOW (ref 12.0–15.0)
MCH: 20 pg — ABNORMAL LOW (ref 26.0–34.0)
MCHC: 29.2 g/dL — AB (ref 30.0–36.0)
MCV: 68.6 fL — AB (ref 78.0–100.0)
Platelets: 451 10*3/uL — ABNORMAL HIGH (ref 150–400)
RBC: 5.19 MIL/uL — ABNORMAL HIGH (ref 3.87–5.11)
RDW: 19.8 % — AB (ref 11.5–15.5)
WBC: 11 10*3/uL — AB (ref 4.0–10.5)

## 2015-10-07 LAB — BASIC METABOLIC PANEL
Anion gap: 9 (ref 5–15)
CHLORIDE: 103 mmol/L (ref 101–111)
CO2: 24 mmol/L (ref 22–32)
Calcium: 8.8 mg/dL — ABNORMAL LOW (ref 8.9–10.3)
Creatinine, Ser: 0.45 mg/dL (ref 0.44–1.00)
GFR calc Af Amer: >60 mL/min (ref >60)
GFR calc non Af Amer: >60 mL/min (ref >60)
GLUCOSE: 146 mg/dL — AB (ref 65–99)
POTASSIUM: 4.2 mmol/L (ref 3.5–5.1)
Sodium: 136 mmol/L (ref 135–145)

## 2015-10-07 LAB — D-DIMER, QUANTITATIVE: D-Dimer, Quant: 0.78 ug/mL-FEU — ABNORMAL HIGH (ref 0.00–0.50)

## 2015-10-07 MED ORDER — TECHNETIUM TC 99M TETROFOSMIN IV KIT
10.0000 | PACK | Freq: Once | INTRAVENOUS | Status: AC | PRN
  Administered 2015-10-07: 10 via INTRAVENOUS

## 2015-10-07 MED ORDER — REGADENOSON 0.4 MG/5ML IV SOLN
INTRAVENOUS | Status: AC
  Filled 2015-10-07: qty 5

## 2015-10-07 MED ORDER — SODIUM CHLORIDE 0.9 % WEIGHT BASED INFUSION: 3.0000 mL/kg/h | INTRAVENOUS | Status: DC

## 2015-10-07 MED ORDER — SODIUM CHLORIDE 0.9% FLUSH: 3.0000 mL | INTRAVENOUS | Status: DC | PRN

## 2015-10-07 MED ORDER — SODIUM CHLORIDE 0.9 % WEIGHT BASED INFUSION
1.0000 mL/kg/h | INTRAVENOUS | Status: DC
  Administered 2015-10-08: 1 mL/kg/h via INTRAVENOUS

## 2015-10-07 MED ORDER — SODIUM CHLORIDE 0.9 % IV SOLN
250.0000 mL | INTRAVENOUS | Status: DC | PRN
Start: 2015-10-07 — End: 2015-10-08

## 2015-10-07 MED ORDER — SODIUM CHLORIDE 0.9% FLUSH: 3.0000 mL | Freq: Two times a day (BID) | INTRAVENOUS | Status: DC

## 2015-10-07 MED ORDER — REGADENOSON 0.4 MG/5ML IV SOLN
0.4000 mg | Freq: Once | INTRAVENOUS | Status: AC
  Administered 2015-10-07: 0.4 mg via INTRAVENOUS
  Filled 2015-10-07: qty 5

## 2015-10-07 MED ORDER — TECHNETIUM TC 99M TETROFOSMIN IV KIT
30.0000 | PACK | Freq: Once | INTRAVENOUS | Status: AC | PRN
  Administered 2015-10-07: 30 via INTRAVENOUS

## 2015-10-07 MED ORDER — ASPIRIN 81 MG PO CHEW
81.0000 mg | CHEWABLE_TABLET | ORAL | Status: AC
  Administered 2015-10-08: 81 mg via ORAL
  Filled 2015-10-07: qty 1

## 2015-10-07 NOTE — Care Management Note (Signed)
Case Management Note  Patient Details  Name: Andrea Wade MRN: [id number] Date of Birth: [birth year]  Subjective/Objective: Pt presented for chest pain. Pt is from home with daughter. Pt has Medicaid and uses [facility]- co pay $3.00. Pt has transportation via daughter. Pt uses a cane and gets around well. Pt has DME RW at home.                   Action/Plan: No further needs assessed at this time.   Expected Discharge Date:                  Expected Discharge Plan:  Home/Self Care  In-House Referral:  NA  Discharge planning Services  CM Consult  Post Acute Care Choice:  NA Choice offered to:  NA  DME Arranged:  N/A DME Agency:  NA  HH Arranged:  NA HH Agency:  NA  Status of Service:  Completed, signed off  If discussed at [place] of [place] Meetings, dates discussed:    Additional Comments:  Bethena Roys, RN 10/07/2015, 11:08 AM

## 2015-10-07 NOTE — Consult Note (Signed)
Reason for Consult: chest pain   Referring Physician: Dr. Tana Coast   PCP:  No primary care provider on file.  Primary Cardiologist:New Dr. Rose Phi is an 45 y.o. female.    Chief Complaint: chest pain   HPI:  Asked to see 25 YOF with no Cardiac hx herself and neg nuc study 2015 but sedentary lifestyle, obesity, diabetes, tobacco use disorder and family history of premature CAD in first-degree relatives. Her brother had MI in his 72s and her mother in her 70s.  She was admitted with chest pain with radiation to Lt arm and Lt leg and jaw pain.  + nausea and SOB, and diaphoresis.  Troponins have been negative.  Currently she continues with episodes of chest discomfort.  Nuc with EF of 51% and septal hypokinesis.  Discussed with Dr. Meda Coffee and this is different from 2015 with EF 60%.  She has never had a cardiac cath. She has had a tubal ligation and chole.. She had morphine yesterday.  Pain now comes and goes.  Other hx as below.  EKG without acute changes.    She has a hx of seizures and has episodes of syncope "sizure" a couple times a month. She also notes her heart racing at times.    Past Medical History:  Diagnosis Date  . Anemia   . Anxiety   . Arthritis    "joints" (04/17/2013)  . Chronic back pain    "all over" (04/17/2013)  . Crohn disease ([place])   . Daily headache   . Depression   . Diabetes type 2, uncontrolled ([place])   . Epileptic ([place])    "had 1-2/night; started RX; last one was 01/2014"  . GERD (gastroesophageal reflux disease)   . Migraine    "q day" (04/17/2013)  . MS (multiple sclerosis) ([place])   . Pneumonia    "about 10 times in my lifetime" (04/17/2013)    Past Surgical History:  Procedure Laterality Date  . CHOLECYSTECTOMY  1994  . TUBAL LIGATION  1993    Family History  Problem Relation Age of Onset  . Diabetes Mother   . Heart disease Mother   . Stroke Mother   . Heart failure Mother   . Heart disease Father   . Stroke Father    . Heart failure Father   . Heart attack Brother   . Healthy Brother    Social History:  reports that she has been smoking Cigarettes.  She has a 10.50 pack-year smoking history. She has never used smokeless tobacco. She reports that she does not drink alcohol or use drugs.  Allergies:  Allergies  Allergen Reactions  . Codeine Hives, Itching and Palpitations  . Peanut-Containing Drug Products Anaphylaxis  . Penicillins Hives, Itching and Nausea And Vomiting    OUTPATIENT MEDICATIONS: No current facility-administered medications on file prior to encounter.    Current Outpatient Prescriptions on File Prior to Encounter  Medication Sig Dispense Refill  . aspirin EC 81 MG tablet Take 1 tablet (81 mg total) by mouth daily. 60 tablet 2  . baclofen (LIORESAL) 20 MG tablet Take 20 mg by mouth every 8 (eight) hours.    . DULoxetine (CYMBALTA) 30 MG capsule Take 30 mg by mouth daily.    . Gabapentin Enacarbil (HORIZANT) 600 MG TBCR Take 600 mg by mouth every morning.     . pantoprazole (PROTONIX) 40 MG tablet Take 1 tablet (40 mg total) by mouth daily. 30 tablet  2  . promethazine (PHENERGAN) 12.5 MG tablet Take 12.5 mg by mouth 2 (two) times daily.     CURRENT MEDICATIONS:  Results for orders placed or performed during the hospital encounter of 10/06/15 (from the past 48 hour(s))  CBC with Differential/Platelet     Status: Abnormal   Collection Time: 10/06/15  4:51 AM  Result Value Ref Range   WBC 14.8 (H) 4.0 - 10.5 K/uL   RBC 5.01 3.87 - 5.11 MIL/uL   Hemoglobin 10.1 (L) 12.0 - 15.0 g/dL   HCT 34.1 (L) 36.0 - 46.0 %   MCV 68.1 (L) 78.0 - 100.0 fL   MCH 20.2 (L) 26.0 - 34.0 pg   MCHC 29.6 (L) 30.0 - 36.0 g/dL   RDW 19.4 (H) 11.5 - 15.5 %   Platelets 405 (H) 150 - 400 K/uL   Neutrophils Relative % 70 %   Lymphocytes Relative 20 %   Monocytes Relative 8 %   Eosinophils Relative 2 %   Basophils Relative 0 %   Neutro Abs 10.3 (H) 1.7 - 7.7 K/uL   Lymphs Abs 3.0 0.7 - 4.0 K/uL    Monocytes Absolute 1.2 (H) 0.1 - 1.0 K/uL   Eosinophils Absolute 0.3 0.0 - 0.7 K/uL   Basophils Absolute 0.0 0.0 - 0.1 K/uL   RBC Morphology POLYCHROMASIA PRESENT    WBC Morphology ATYPICAL LYMPHOCYTES   Basic metabolic panel     Status: Abnormal   Collection Time: 10/06/15  4:51 AM  Result Value Ref Range   Sodium 126 (L) 135 - 145 mmol/L   Potassium 3.3 (L) 3.5 - 5.1 mmol/L   Chloride 90 (L) 101 - 111 mmol/L   CO2 25 22 - 32 mmol/L   Glucose, Bld 158 (H) 65 - 99 mg/dL   BUN <5 (L) 6 - 20 mg/dL   Creatinine, Ser 0.49 0.44 - 1.00 mg/dL   Calcium 8.6 (L) 8.9 - 10.3 mg/dL   GFR calc non Af Amer >60 >60 mL/min   GFR calc Af Amer >60 >60 mL/min    Comment: (NOTE) The eGFR has been calculated using the CKD EPI equation. This calculation has not been validated in all clinical situations. eGFR's persistently <60 mL/min signify possible Chronic Kidney Disease.    Anion gap 11 5 - 15  I-stat chem 8, ed     Status: Abnormal   Collection Time: 10/06/15  5:00 AM  Result Value Ref Range   Sodium 129 (L) 135 - 145 mmol/L   Potassium 3.4 (L) 3.5 - 5.1 mmol/L   Chloride 89 (L) 101 - 111 mmol/L   BUN <3 (L) 6 - 20 mg/dL   Creatinine, Ser 0.40 (L) 0.44 - 1.00 mg/dL   Glucose, Bld 160 (H) 65 - 99 mg/dL   Calcium, Ion 1.09 (L) 1.13 - 1.30 mmol/L   TCO2 25 0 - 100 mmol/L   Hemoglobin 12.2 12.0 - 15.0 g/dL   HCT 36.0 36.0 - 46.0 %  I-stat troponin, ED     Status: None   Collection Time: 10/06/15  5:00 AM  Result Value Ref Range   Troponin i, poc 0.01 0.00 - 0.08 ng/mL   Comment 3            Comment: Due to the release kinetics of cTnI, a negative result within the first hours of the onset of symptoms does not rule out myocardial infarction with certainty. If myocardial infarction is still suspected, repeat the test at appropriate intervals.   I-Stat Beta  hCG blood, ED ([facility], [facility], [facility] only)     Status: None   Collection Time: 10/06/15  5:09 AM  Result Value Ref Range   I-stat hCG,  quantitative <5.0 <5 mIU/mL   Comment 3            Comment:   GEST. AGE      CONC.  (mIU/mL)   <=1 WEEK        5 - 50     2 WEEKS       50 - 500     3 WEEKS       100 - 10,000     4 WEEKS     1,000 - 30,000        FEMALE AND NON-PREGNANT FEMALE:     LESS THAN 5 mIU/mL   Culture, blood (Routine X 2) w Reflex to ID Panel     Status: None (Preliminary result)   Collection Time: 10/06/15  8:25 AM  Result Value Ref Range   Specimen Description BLOOD RIGHT HAND    Special Requests BOTTLES DRAWN AEROBIC AND ANAEROBIC  5CC    Culture NO GROWTH 1 DAY    Report Status PENDING   Culture, blood (Routine X 2) w Reflex to ID Panel     Status: None (Preliminary result)   Collection Time: 10/06/15  8:30 AM  Result Value Ref Range   Specimen Description BLOOD LEFT HAND    Special Requests BOTTLES DRAWN AEROBIC AND ANAEROBIC  5CC    Culture NO GROWTH 1 DAY    Report Status PENDING   POC occult blood, ED     Status: None   Collection Time: 10/06/15  9:43 AM  Result Value Ref Range   Fecal Occult Bld NEGATIVE NEGATIVE  Urinalysis, Routine w reflex microscopic (not at [facility])     Status: Abnormal   Collection Time: 10/06/15  9:58 AM  Result Value Ref Range   Color, Urine YELLOW YELLOW   APPearance CLEAR CLEAR   Specific Gravity, Urine 1.033 (H) 1.005 - 1.030   pH 5.5 5.0 - 8.0   Glucose, UA >1000 (A) NEGATIVE mg/dL   Hgb urine dipstick MODERATE (A) NEGATIVE   Bilirubin Urine NEGATIVE NEGATIVE   Ketones, ur NEGATIVE NEGATIVE mg/dL   Protein, ur NEGATIVE NEGATIVE mg/dL   Nitrite NEGATIVE NEGATIVE   Leukocytes, UA NEGATIVE NEGATIVE  Urine culture     Status: Abnormal   Collection Time: 10/06/15  9:58 AM  Result Value Ref Range   Specimen Description URINE, CLEAN CATCH    Special Requests Normal    Culture <10,000 COLONIES/mL INSIGNIFICANT GROWTH (A)    Report Status 10/07/2015 FINAL   Urine microscopic-add on     Status: Abnormal   Collection Time: 10/06/15  9:58 AM  Result Value Ref Range    Squamous Epithelial / LPF 0-5 (A) NONE SEEN   WBC, UA NONE SEEN 0 - 5 WBC/hpf   RBC / HPF 0-5 0 - 5 RBC/hpf   Bacteria, UA NONE SEEN NONE SEEN   Urine-Other YEAST PRESENT   CBC     Status: Abnormal   Collection Time: 10/06/15 10:23 AM  Result Value Ref Range   WBC 11.7 (H) 4.0 - 10.5 K/uL   RBC 4.81 3.87 - 5.11 MIL/uL   Hemoglobin 9.7 (L) 12.0 - 15.0 g/dL    Comment: REPEATED TO VERIFY DELTA CHECK NOTED    HCT 32.7 (L) 36.0 - 46.0 %   MCV 68.0 (L) 78.0 - 100.0  fL   MCH 20.2 (L) 26.0 - 34.0 pg   MCHC 29.7 (L) 30.0 - 36.0 g/dL   RDW 19.4 (H) 11.5 - 15.5 %   Platelets 364 150 - 400 K/uL  Creatinine, serum     Status: Abnormal   Collection Time: 10/06/15 10:23 AM  Result Value Ref Range   Creatinine, Ser 0.41 (L) 0.44 - 1.00 mg/dL   GFR calc non Af Amer >60 >60 mL/min   GFR calc Af Amer >60 >60 mL/min    Comment: (NOTE) The eGFR has been calculated using the CKD EPI equation. This calculation has not been validated in all clinical situations. eGFR's persistently <60 mL/min signify possible Chronic Kidney Disease.   Troponin I (q 6hr x 3)     Status: None   Collection Time: 10/06/15 10:23 AM  Result Value Ref Range   Troponin I <0.03 <0.03 ng/mL  CBG monitoring, ED     Status: Abnormal   Collection Time: 10/06/15  2:02 PM  Result Value Ref Range   Glucose-Capillary 101 (H) 65 - 99 mg/dL  Troponin I (q 6hr x 3)     Status: None   Collection Time: 10/06/15  3:59 PM  Result Value Ref Range   Troponin I <0.03 <0.03 ng/mL  Glucose, capillary     Status: Abnormal   Collection Time: 10/06/15  4:48 PM  Result Value Ref Range   Glucose-Capillary 111 (H) 65 - 99 mg/dL  Glucose, capillary     Status: Abnormal   Collection Time: 10/06/15  9:40 PM  Result Value Ref Range   Glucose-Capillary 167 (H) 65 - 99 mg/dL  Troponin I (q 6hr x 3)     Status: None   Collection Time: 10/06/15  9:43 PM  Result Value Ref Range   Troponin I <0.03 <0.03 ng/mL  Basic metabolic panel     Status:  Abnormal   Collection Time: 10/07/15  4:41 AM  Result Value Ref Range   Sodium 136 135 - 145 mmol/L    Comment: DELTA CHECK NOTED   Potassium 4.2 3.5 - 5.1 mmol/L    Comment: DELTA CHECK NOTED   Chloride 103 101 - 111 mmol/L   CO2 24 22 - 32 mmol/L   Glucose, Bld 146 (H) 65 - 99 mg/dL   BUN <5 (L) 6 - 20 mg/dL   Creatinine, Ser 0.45 0.44 - 1.00 mg/dL   Calcium 8.8 (L) 8.9 - 10.3 mg/dL   GFR calc non Af Amer >60 >60 mL/min   GFR calc Af Amer >60 >60 mL/min    Comment: (NOTE) The eGFR has been calculated using the CKD EPI equation. This calculation has not been validated in all clinical situations. eGFR's persistently <60 mL/min signify possible Chronic Kidney Disease.    Anion gap 9 5 - 15  CBC     Status: Abnormal   Collection Time: 10/07/15  4:41 AM  Result Value Ref Range   WBC 11.0 (H) 4.0 - 10.5 K/uL   RBC 5.19 (H) 3.87 - 5.11 MIL/uL   Hemoglobin 10.4 (L) 12.0 - 15.0 g/dL   HCT 35.6 (L) 36.0 - 46.0 %   MCV 68.6 (L) 78.0 - 100.0 fL   MCH 20.0 (L) 26.0 - 34.0 pg   MCHC 29.2 (L) 30.0 - 36.0 g/dL   RDW 19.8 (H) 11.5 - 15.5 %   Platelets 451 (H) 150 - 400 K/uL  Glucose, capillary     Status: Abnormal   Collection Time: 10/07/15  7:50  AM  Result Value Ref Range   Glucose-Capillary 151 (H) 65 - 99 mg/dL   Comment 1 Notify RN    Comment 2 Document in Chart   D-dimer, quantitative (not at [facility])     Status: Abnormal   Collection Time: 10/07/15 10:50 AM  Result Value Ref Range   D-Dimer, Quant 0.78 (H) 0.00 - 0.50 ug/mL-FEU    Comment: (NOTE) At the manufacturer cut-off of 0.50 ug/mL FEU, this assay has been documented to exclude PE with a sensitivity and negative predictive value of 97 to 99%.  At this time, this assay has not been approved by the FDA to exclude DVT/VTE. Results should be correlated with clinical presentation.   Glucose, capillary     Status: Abnormal   Collection Time: 10/07/15 11:26 AM  Result Value Ref Range   Glucose-Capillary 147 (H) 65 - 99 mg/dL    Comment 1 Notify RN    Comment 2 Document in Chart   Glucose, capillary     Status: Abnormal   Collection Time: 10/07/15  4:41 PM  Result Value Ref Range   Glucose-Capillary 178 (H) 65 - 99 mg/dL   Comment 1 Notify RN    Comment 2 Document in Chart    Ct Angio Chest Pe W Or Wo Contrast  Result Date: 10/06/2015 CLINICAL DATA:  45 year old female with left-sided chest pain radiating to the back. EXAM: CT ANGIOGRAPHY CHEST WITH CONTRAST TECHNIQUE: Multidetector CT imaging of the chest was performed using the standard protocol during bolus administration of intravenous contrast. Multiplanar CT image reconstructions and MIPs were obtained to evaluate the vascular anatomy. CONTRAST:  100 cc Isovue 370 COMPARISON:  Chest CT dated 05/31/2015 FINDINGS: There is mild diffuse airspace haziness of the upper lung as well as minimal bibasilar subpleural densities most consistent with atelectatic changes. There is no focal consolidation, pleural effusion, or pneumothorax. The central airways are patent. There is mildly dilated appearance of the ascending aorta, likely related to motion artifact. There is no evidence of aortic dissection. Evaluation of the pulmonary arteries is limited due to suboptimal opacification of the peripheral branches and streak artifact caused by patient's arms. No definite pulmonary artery embolus identified. Top-normal cardiac size. No pericardial effusion. There is no hilar or mediastinal adenopathy. The esophagus is grossly unremarkable. No thyroid nodules identified. There is no axillary adenopathy. The chest wall soft tissues appear unremarkable. T8 hemangioma. The osseous structures are intact. The visualized upper abdomen appears unremarkable. Review of the MIP images confirms the above findings. IMPRESSION: No CT evidence of central pulmonary artery embolus. Electronically Signed   By: Anner Crete M.D.   On: 10/06/2015 06:22   Nm Myocar Multi W/spect W/wall Motion /  Ef  Result Date: 10/07/2015 CLINICAL DATA:  Chest pain, diabetes, shortness of breath, previous tobacco abuse. EXAM: MYOCARDIAL IMAGING WITH SPECT (REST AND PHARMACOLOGIC-STRESS) GATED LEFT VENTRICULAR WALL MOTION STUDY LEFT VENTRICULAR EJECTION FRACTION TECHNIQUE: Standard myocardial SPECT imaging was performed after resting intravenous injection of 10 mCi Tc-15msestamibi. Subsequently, intravenous infusion of Lexiscan was performed under the supervision of the Cardiology staff. At peak effect of the drug, 30 mCi Tc-99mestamibi was injected intravenously and standard myocardial SPECT imaging was performed. Quantitative gated imaging was also performed to evaluate left ventricular wall motion, and estimate left ventricular ejection fraction. COMPARISON:  None. FINDINGS: Perfusion: No decreased activity in the left ventricle on stress imaging to suggest reversible ischemia or infarction. Wall Motion: Mild septal hypokinesis. No left ventricular dilatation. Left Ventricular Ejection  Fraction: 51 % End diastolic volume 675 ml End systolic volume 69 ml IMPRESSION: 1. No reversible ischemia or infarction. 2. Mild septal hypokinesis. 3. Left ventricular ejection fraction 51% 4. Low-risk stress test findings*. *2012 Appropriate Use Criteria for Coronary Revascularization Focused Update: J Am Coll Cardiol. 9163;84(6):659-935. http://content.[email or web address]?articleid=[email or web address] Electronically Signed   By: Lucrezia Europe M.D.   On: 10/07/2015 16:39    ROS: General:no colds or fevers, + weight gain from 2015 by 54 lbs. Skin:no rashes or ulcers HEENT:no blurred vision, no congestion CV:see HPI PUL:see HPI GI:no diarrhea constipation or melena, no indigestion GU:no hematuria, no dysuria MS:no joint pain, no claudication Neuro:+ syncope- syncope, no lightheadedness Endo:+ diabetes, no thyroid disease  Blood pressure 134/77, pulse 76, temperature 98.4 F (36.9 C), temperature source Oral, resp. rate 18,  height _0  (1.753 m), weight 252 lb 3.2 oz (114.4 kg), last menstrual period 10/07/2015, SpO2 97 %.  Wt Readings from Last 3 Encounters:  10/07/15 252 lb 3.2 oz (114.4 kg)  07/22/13 198 lb (89.8 kg)  06/17/13 203 lb (92.1 kg)   TS:[id number] affect, NAD Skin:Warm and dry, brisk capillary refill HEENT:normocephalic, sclera clear, mucus membranes moist Neck:supple, no JVD, no bruits  Heart:S1S2 RRR without murmur, gallup, rub or click Lungs:clear without rales, rhonchi, or wheezes QTM:AUQJ, non tender, + BS, do not palpate liver spleen or masses Ext:no lower ext edema, 2+ pedal pulses, 2+ radial pulses Neuro:alert and oriented X 3, MAE, follows commands, + facial symmetry  Assessment/Plan Principal Problem:   Atypical chest pain Active Problems:   Tobacco abuse   DM2 (diabetes mellitus, type 2) (HCC)  Chest pain with decrease of EF on nuc study from 2015 and multiple cardiac risk factors for CAD including family hx of premature CAD.  Discussed with Dr. Meda Coffee will plan for cardiac cath in AM Dr. Meda Coffee to discuss.  --metoprolol has been added   DM2- per IM  Tobacco use, needs to stop even if normal cardiac cath.    HLD will check fasting lipids in AM and continue lipitor for now.      Hx seizures   Cecilie Kicks  Nurse Practitioner Certified [place] Pager [phone] or after 5pm or weekends call [phone] 10/07/2015, 5:02 PM  The patient was seen, examined and discussed with Cecilie Kicks, NP and I agree with the above.   A very pleasant 45 year old female with prior medical history of long-standing diabetes, obesity, physical inactivity, ongoing smoking and very significant family history of coronary artery disease that includes myocardial infarction in both of her parents in their 24s and at age of 46 in her younger brother. The patient presented with chest pain with some typical and some atypical features however also complains of worsening  functional capacity and worsening shortness of breath on exertion. She has also noticed lower extremity edema in the last 1-2 months. Her stress test is affected by artifact but appears to have reversible defect consistent with ischemia in the LAD territory. Her LVEF is calculated at 51%, we will order an echocardiogram to confirm and evaluate for systolic and diastolic function. We'll check her lipids, she has never been on any cholesterol medication. Her prior cholesterol was checked in 2015 with triglycerides 193, HDL 22 and LDL of 56.  We'll schedule her for left cardiac catheterization for tomorrow.  Ena Dawley 10/07/2015

## 2015-10-07 NOTE — Progress Notes (Signed)
[facility]                                                                              Patient Demographics  Andrea Wade, is a 45 y.o. female, DOB - 21-Jul-1970, HWK:[id number]  Admit date - 10/06/2015   Admitting Physician Courage Denton Brick, MD  Outpatient Primary MD for the patient is No primary care provider on file.  Outpatient specialists:   LOS - 0  days    Chief Complaint  Patient presents with  . Chest Pain       Brief summary   Andrea Wade  is a 45 y.o. female, With past medical history relevant for diabetes, obesity and family history of premature CAD in multiple first degree relatives who presents to the ED with left-sided chest discomfort radiating to her left arm and apparently left leg and jaw. Chest discomfort started less than an hour prior to arrival to the ED, it was associated with nausea and shortness of breath but no emesis. No leg pains no pleuritic symptoms, no fever no chills no productive cough she is a smoker. No palpitations no dizziness no diaphoresis. Please note the patient had a nuclear stress test in February 2015 without reversible ischemia. Chest discomfort was described as dull, retracted about 7/10 in intensity, did not get better with rest did not get worse with activity. No diarrhea   Assessment & Plan    Atypical Chest Pain-  Cardiovascular risk factors include Sedentary lifestyle, obesity, diabetes, tobacco use disorder and family history of premature CAD in first-degree relatives,  - Serial cardiac enzymes negative  - No acute ST-T wave changes just above ischemia  - Patient had nuclear stress test in February 2015 without reversible ischemia, however there was a question of possible fixed defect around the apex, EF on gated images from that particular stress test was around 60%. CTA chest is negative for PE -Audiology consulted, Repeat nuclear medicine stress test  DM-continue Levemir insulin 40 units daily, Use  Novolog/Humalog Sliding scale insulin with Accu-Cheks/Fingersticks as ordered    tobacco use disorder-smoking cessation strongly advised, patient is not ready to set a quit date   chronic pain syndrome-continue Nucynta, Cymbalta,  Baclofen, nortriptyline and Gabapentin  Leukocytosis-likely reactive -  etiology unclear, UA negative for UTI, CT angiogram chest showed no pneumonia.    Anemia-microcytic and hypochromic, most likely secondary to menorrhagia, LMP 3 weeks ago with heavy clots - Stool occult test negative, follow-up with PCP for pelvic ultrasound and further evaluation of menorrhagia. Baseline hemoglobin around 11  Code Status: Full code DVT prophylaxis SCD's Family Communication: Discussed in detail with the patient, all imaging results, lab results explained to the patient   Disposition Plan: Home if stress test is negative  Time Spent in minutes   25 minutes  Procedures:  CT Achest  Consultants:   Cardiology  Antimicrobials:      Medications  Scheduled Meds: . aspirin EC  81 mg Oral Daily  . atorvastatin  40 mg Oral q1800  . baclofen  20 mg Oral Q8H  . DULoxetine  30 mg Oral Daily  . gabapentin  300 mg Oral  BID  . heparin  5,000 Units Subcutaneous Q8H  . insulin aspart  0-9 Units Subcutaneous TID WC  . insulin detemir  40 Units Subcutaneous QHS  . metoprolol tartrate  25 mg Oral BID  . nortriptyline  25 mg Oral QHS  . pantoprazole  40 mg Oral Daily  . promethazine  12.5 mg Oral BID  . regadenoson      . senna  1 tablet Oral BID  . sodium chloride flush  3 mL Intravenous Q12H  . tapentadol  75 mg Oral TID   Continuous Infusions: . sodium chloride 75 mL/hr at 10/06/15 1601   PRN Meds:.sodium chloride, acetaminophen **OR** acetaminophen, albuterol, lidocaine-prilocaine, morphine injection, ondansetron **OR** ondansetron (ZOFRAN) IV, polyethylene glycol, sodium chloride flush, traZODone   Antibiotics   Anti-infectives    None         Subjective:   Andrea Wade was seen and examined today.  Patient denies dizziness, shortness of breath, abdominal pain, N/V/D/C, new weakness, numbess, tingling. No acute events overnight.  Chest pain improving.  Objective:   Vitals:   10/07/15 0939 10/07/15 0940 10/07/15 0942 10/07/15 1108  BP: (!) 145/53 (!) 143/58 140/61 128/70  Pulse:    78  Resp:      Temp:      TempSrc:      SpO2:      Weight:      Height:        Intake/Output Summary (Last 24 hours) at 10/07/15 1131 Last data filed at 10/07/15 0800  Gross per 24 hour  Intake          1093.75 ml  Output                0 ml  Net          1093.75 ml     Wt Readings from Last 3 Encounters:  10/07/15 114.4 kg (252 lb 3.2 oz)  07/22/13 89.8 kg (198 lb)  06/17/13 92.1 kg (203 lb)     Exam  General: Alert and oriented x 3, NAD  HEENT:  PERRLA, EOMI, Anicteric Sclera, mucous membranes moist.   Neck: Supple, no JVD, no masses  Cardiovascular: S1 S2 auscultated, no rubs, murmurs or gallops. Regular rate and rhythm.  Respiratory: Clear to auscultation bilaterally, no wheezing, rales or rhonchi  Gastrointestinal: Soft, nontender, nondistended, + bowel sounds  Ext: no cyanosis clubbing or edema  Neuro: AAOx3, Cr N's II- XII. Strength 5/5 upper and lower extremities bilaterally  Skin: No rashes  Psych: Normal affect and demeanor, alert and oriented x3    Data Reviewed:  I have personally reviewed following labs and imaging studies  Micro Results Recent Results (from the past 240 hour(s))  Culture, blood (Routine X 2) w Reflex to ID Panel     Status: None (Preliminary result)   Collection Time: 10/06/15  8:25 AM  Result Value Ref Range Status   Specimen Description BLOOD RIGHT HAND  Final   Special Requests BOTTLES DRAWN AEROBIC AND ANAEROBIC  5CC  Final   Culture NO GROWTH < 12 HOURS  Final   Report Status PENDING  Incomplete  Culture, blood (Routine X 2) w Reflex to ID Panel     Status: None  (Preliminary result)   Collection Time: 10/06/15  8:30 AM  Result Value Ref Range Status   Specimen Description BLOOD LEFT HAND  Final   Special Requests BOTTLES DRAWN AEROBIC AND ANAEROBIC  5CC  Final   Culture NO GROWTH < 12  HOURS  Final   Report Status PENDING  Incomplete    Radiology Reports Ct Angio Chest Pe W Or Wo Contrast  Result Date: 10/06/2015 CLINICAL DATA:  45 year old female with left-sided chest pain radiating to the back. EXAM: CT ANGIOGRAPHY CHEST WITH CONTRAST TECHNIQUE: Multidetector CT imaging of the chest was performed using the standard protocol during bolus administration of intravenous contrast. Multiplanar CT image reconstructions and MIPs were obtained to evaluate the vascular anatomy. CONTRAST:  100 cc Isovue 370 COMPARISON:  Chest CT dated 05/31/2015 FINDINGS: There is mild diffuse airspace haziness of the upper lung as well as minimal bibasilar subpleural densities most consistent with atelectatic changes. There is no focal consolidation, pleural effusion, or pneumothorax. The central airways are patent. There is mildly dilated appearance of the ascending aorta, likely related to motion artifact. There is no evidence of aortic dissection. Evaluation of the pulmonary arteries is limited due to suboptimal opacification of the peripheral branches and streak artifact caused by patient's arms. No definite pulmonary artery embolus identified. Top-normal cardiac size. No pericardial effusion. There is no hilar or mediastinal adenopathy. The esophagus is grossly unremarkable. No thyroid nodules identified. There is no axillary adenopathy. The chest wall soft tissues appear unremarkable. T8 hemangioma. The osseous structures are intact. The visualized upper abdomen appears unremarkable. Review of the MIP images confirms the above findings. IMPRESSION: No CT evidence of central pulmonary artery embolus. Electronically Signed   By: Anner Crete M.D.   On: 10/06/2015 06:22    Lab  Data:  CBC:  Recent Labs Lab 10/06/15 0451 10/06/15 0500 10/06/15 1023 10/07/15 0441  WBC 14.8*  --  11.7* 11.0*  NEUTROABS 10.3*  --   --   --   HGB 10.1* 12.2 9.7* 10.4*  HCT 34.1* 36.0 32.7* 35.6*  MCV 68.1*  --  68.0* 68.6*  PLT 405*  --  364 681*   Basic Metabolic Panel:  Recent Labs Lab 10/06/15 0451 10/06/15 0500 10/06/15 1023 10/07/15 0441  NA 126* 129*  --  136  K 3.3* 3.4*  --  4.2  CL 90* 89*  --  103  CO2 25  --   --  24  GLUCOSE 158* 160*  --  146*  BUN <5* <3*  --  <5*  CREATININE 0.49 0.40* 0.41* 0.45  CALCIUM 8.6*  --   --  8.8*   GFR: Estimated Creatinine Clearance: 118.6 mL/min (by C-G formula based on SCr of 0.8 mg/dL). Liver Function Tests: No results for input(s): AST, ALT, ALKPHOS, BILITOT, PROT, ALBUMIN in the last 168 hours. No results for input(s): LIPASE, AMYLASE in the last 168 hours. No results for input(s): AMMONIA in the last 168 hours. Coagulation Profile: No results for input(s): INR, PROTIME in the last 168 hours. Cardiac Enzymes:  Recent Labs Lab 10/06/15 1023 10/06/15 1559 10/06/15 2143  TROPONINI <0.03 <0.03 <0.03   BNP (last 3 results) No results for input(s): PROBNP in the last 8760 hours. HbA1C: No results for input(s): HGBA1C in the last 72 hours. CBG:  Recent Labs Lab 10/06/15 1402 10/06/15 1648 10/06/15 2140 10/07/15 0750  GLUCAP 101* 111* 167* 151*   Lipid Profile: No results for input(s): CHOL, HDL, LDLCALC, TRIG, CHOLHDL, LDLDIRECT in the last 72 hours. Thyroid Function Tests: No results for input(s): TSH, T4TOTAL, FREET4, T3FREE, THYROIDAB in the last 72 hours. Anemia Panel: No results for input(s): VITAMINB12, FOLATE, FERRITIN, TIBC, IRON, RETICCTPCT in the last 72 hours. Urine analysis:    Component Value Date/Time  COLORURINE YELLOW 10/06/2015 [place] 10/06/2015 0958   LABSPEC 1.033 (H) 10/06/2015 0958   PHURINE 5.5 10/06/2015 0958   GLUCOSEU >1000 (A) 10/06/2015 0958    HGBUR MODERATE (A) 10/06/2015 0958   BILIRUBINUR NEGATIVE 10/06/2015 0958   KETONESUR NEGATIVE 10/06/2015 0958   PROTEINUR NEGATIVE 10/06/2015 0958   UROBILINOGEN 0.2 07/22/2013 1703   NITRITE NEGATIVE 10/06/2015 0958   LEUKOCYTESUR NEGATIVE 10/06/2015 0958     RAI,RIPUDEEP M.D. [facility] 10/07/2015, 11:31 AM  Pager: [phone] Between 7am to 7pm - call Pager - [phone]  After 7pm go to [email or web address] - password [email or web address]  Call night coverage person covering after 7pm

## 2015-10-07 NOTE — Progress Notes (Signed)
Stress portion without complications- Nuclear report is pending.

## 2015-10-08 ENCOUNTER — Encounter (HOSPITAL_COMMUNITY): Payer: Self-pay | Admitting: Cardiology

## 2015-10-08 ENCOUNTER — Encounter (HOSPITAL_COMMUNITY)
Admission: EM | Disposition: A | Discharge status: Home / Self Care | Payer: Self-pay | Source: Home / Self Care | Attending: Internal Medicine

## 2015-10-08 ENCOUNTER — Inpatient Hospital Stay (HOSPITAL_COMMUNITY): Payer: Medicare HMO

## 2015-10-08 DIAGNOSIS — Z720 Tobacco use: Secondary | ICD-10-CM

## 2015-10-08 DIAGNOSIS — R079 Chest pain, unspecified: Secondary | ICD-10-CM

## 2015-10-08 DIAGNOSIS — R0789 Other chest pain: Principal | ICD-10-CM

## 2015-10-08 HISTORY — PX: CARDIAC CATHETERIZATION: SHX172

## 2015-10-08 LAB — BASIC METABOLIC PANEL
Anion gap: 9 (ref 5–15)
BUN: <5 mg/dL — ABNORMAL LOW (ref 6–20)
CALCIUM: 8.9 mg/dL (ref 8.9–10.3)
CO2: 23 mmol/L (ref 22–32)
CREATININE: 0.46 mg/dL (ref 0.44–1.00)
Chloride: 104 mmol/L (ref 101–111)
GFR calc Af Amer: >60 mL/min (ref >60)
GFR calc non Af Amer: >60 mL/min (ref >60)
GLUCOSE: 171 mg/dL — AB (ref 65–99)
Potassium: 3.8 mmol/L (ref 3.5–5.1)
Sodium: 136 mmol/L (ref 135–145)

## 2015-10-08 LAB — GLUCOSE, CAPILLARY
GLUCOSE-CAPILLARY: 142 mg/dL — AB (ref 65–99)
Glucose-Capillary: 152 mg/dL — ABNORMAL HIGH (ref 65–99)
Glucose-Capillary: 149 mg/dL — ABNORMAL HIGH (ref 65–99)

## 2015-10-08 LAB — LIPID PANEL
CHOLESTEROL: 154 mg/dL (ref 0–200)
HDL: 26 mg/dL — ABNORMAL LOW (ref >40)
LDL Cholesterol: 94 mg/dL (ref 0–99)
TRIGLYCERIDES: 169 mg/dL — AB (ref <150)
Total CHOL/HDL Ratio: 5.9 RATIO
VLDL: 34 mg/dL (ref 0–40)

## 2015-10-08 LAB — CBC
HCT: 36.8 % (ref 36.0–46.0)
HEMOGLOBIN: 10.8 g/dL — AB (ref 12.0–15.0)
MCH: 20.1 pg — AB (ref 26.0–34.0)
MCHC: 29.3 g/dL — AB (ref 30.0–36.0)
MCV: 68.7 fL — ABNORMAL LOW (ref 78.0–100.0)
PLATELETS: 515 10*3/uL — AB (ref 150–400)
RBC: 5.36 MIL/uL — ABNORMAL HIGH (ref 3.87–5.11)
RDW: 20 % — ABNORMAL HIGH (ref 11.5–15.5)
WBC: 10.9 10*3/uL — ABNORMAL HIGH (ref 4.0–10.5)

## 2015-10-08 LAB — ECHOCARDIOGRAM COMPLETE
Height: 69 in
Weight: 3912 oz

## 2015-10-08 LAB — PROTIME-INR
INR: 1.04
PROTHROMBIN TIME: 13.6 s (ref 11.4–15.2)

## 2015-10-08 SURGERY — LEFT HEART CATH AND CORONARY ANGIOGRAPHY

## 2015-10-08 MED ORDER — FENTANYL CITRATE (PF) 100 MCG/2ML IJ SOLN
INTRAMUSCULAR | Status: AC
  Filled 2015-10-08: qty 2

## 2015-10-08 MED ORDER — IOPAMIDOL (ISOVUE-370) INJECTION 76%
INTRAVENOUS | Status: AC
  Filled 2015-10-08: qty 100

## 2015-10-08 MED ORDER — IOPAMIDOL (ISOVUE-370) INJECTION 76%
INTRAVENOUS | Status: DC | PRN
  Administered 2015-10-08: 75 mL via INTRA_ARTERIAL

## 2015-10-08 MED ORDER — VERAPAMIL HCL 2.5 MG/ML IV SOLN
INTRAVENOUS | Status: AC
  Filled 2015-10-08: qty 2

## 2015-10-08 MED ORDER — HEPARIN SODIUM (PORCINE) 1000 UNIT/ML IJ SOLN
INTRAMUSCULAR | Status: AC
  Filled 2015-10-08: qty 1

## 2015-10-08 MED ORDER — LIDOCAINE HCL (PF) 1 % IJ SOLN
INTRAMUSCULAR | Status: DC | PRN
  Administered 2015-10-08: 2 mL via INTRADERMAL

## 2015-10-08 MED ORDER — HEPARIN (PORCINE) IN NACL 2-0.9 UNIT/ML-% IJ SOLN
INTRAMUSCULAR | Status: DC | PRN
  Administered 2015-10-08: 1000 mL

## 2015-10-08 MED ORDER — SODIUM CHLORIDE 0.9% FLUSH: 3.0000 mL | Freq: Two times a day (BID) | INTRAVENOUS | Status: DC

## 2015-10-08 MED ORDER — HEPARIN (PORCINE) IN NACL 2-0.9 UNIT/ML-% IJ SOLN
INTRAMUSCULAR | Status: AC
  Filled 2015-10-08: qty 1000

## 2015-10-08 MED ORDER — SODIUM CHLORIDE 0.9% FLUSH: 3.0000 mL | INTRAVENOUS | Status: DC | PRN

## 2015-10-08 MED ORDER — LIDOCAINE HCL (PF) 1 % IJ SOLN
INTRAMUSCULAR | Status: AC
  Filled 2015-10-08: qty 30

## 2015-10-08 MED ORDER — SODIUM CHLORIDE 0.9 % IV SOLN
250.0000 mL | INTRAVENOUS | Status: DC | PRN
Start: 2015-10-08 — End: 2015-10-08

## 2015-10-08 MED ORDER — MIDAZOLAM HCL 2 MG/2ML IJ SOLN
INTRAMUSCULAR | Status: AC
  Filled 2015-10-08: qty 2

## 2015-10-08 MED ORDER — LISINOPRIL 5 MG PO TABS: 5.0000 mg | ORAL_TABLET | Freq: Every day | ORAL | 3 refills | Status: DC

## 2015-10-08 MED ORDER — VERAPAMIL HCL 2.5 MG/ML IV SOLN
INTRAVENOUS | Status: DC | PRN
  Administered 2015-10-08: 10 mL via INTRA_ARTERIAL

## 2015-10-08 MED ORDER — PERFLUTREN LIPID MICROSPHERE
INTRAVENOUS | Status: AC
  Administered 2015-10-08: 2 mL
  Filled 2015-10-08: qty 10

## 2015-10-08 MED ORDER — LISINOPRIL 5 MG PO TABS
5.0000 mg | ORAL_TABLET | Freq: Every day | ORAL | Status: DC
  Administered 2015-10-08: 5 mg via ORAL
  Filled 2015-10-08: qty 1

## 2015-10-08 MED ORDER — SODIUM CHLORIDE 0.9 % WEIGHT BASED INFUSION: 1.0000 mL/kg/h | INTRAVENOUS | Status: AC

## 2015-10-08 MED ORDER — HEPARIN SODIUM (PORCINE) 1000 UNIT/ML IJ SOLN
INTRAMUSCULAR | Status: DC | PRN
  Administered 2015-10-08: 5000 [IU] via INTRAVENOUS

## 2015-10-08 MED ORDER — FENTANYL CITRATE (PF) 100 MCG/2ML IJ SOLN
INTRAMUSCULAR | Status: DC | PRN
  Administered 2015-10-08: 25 ug via INTRAVENOUS

## 2015-10-08 MED ORDER — PERFLUTREN LIPID MICROSPHERE
1.0000 mL | INTRAVENOUS | Status: AC | PRN
  Filled 2015-10-08: qty 10

## 2015-10-08 MED ORDER — MIDAZOLAM HCL 2 MG/2ML IJ SOLN
INTRAMUSCULAR | Status: DC | PRN
  Administered 2015-10-08: 1 mg via INTRAVENOUS

## 2015-10-08 SURGICAL SUPPLY — 11 items
CATH INFINITI 5 FR JL3.5 (CATHETERS) ×1 IMPLANT
CATH INFINITI 5FR ANG PIGTAIL (CATHETERS) ×1 IMPLANT
DEVICE RAD COMP TR BAND LRG (VASCULAR PRODUCTS) ×1 IMPLANT
GLIDESHEATH SLEND SS 6F .021 (SHEATH) ×1 IMPLANT
GUIDE CATH RUNWAY 6FR AL 75 (CATHETERS) ×1 IMPLANT
KIT HEART LEFT (KITS) ×2 IMPLANT
PACK CARDIAC CATHETERIZATION (CUSTOM PROCEDURE TRAY) ×2 IMPLANT
SYR MEDRAD MARK V 150ML (SYRINGE) ×2 IMPLANT
TRANSDUCER W/STOPCOCK (MISCELLANEOUS) ×2 IMPLANT
TUBING CIL FLEX 10 FLL-RA (TUBING) ×2 IMPLANT
WIRE J 3MM .035X260CM (WIRE) ×1 IMPLANT

## 2015-10-08 NOTE — Discharge Summary (Addendum)
Physician Discharge Summary   Patient ID: Andrea Wade MRN: [id number] DOB/AGE: 04/28/1970 45 y.o.  Admit date: 10/06/2015 Discharge date: 10/08/2015  Primary Care Physician:  No primary care provider on file.  Discharge Diagnoses:    . Atypical chest pain . Tobacco abuse .  Diabetes mellitus    Chronic pain syndrome    Anemia  GERD   Consults:  Cardiology, Dr Meda Coffee   Recommendations for Outpatient Follow-up:  1. Cardiac cath Normal 2. Please repeat CBC/BMET at next visit   DIET: carb modified diet    Allergies:   Allergies  Allergen Reactions  . Codeine Hives, Itching and Palpitations  . Peanut-Containing Drug Products Anaphylaxis  . Penicillins Hives, Itching and Nausea And Vomiting     DISCHARGE MEDICATIONS: Current Discharge Medication List    START taking these medications   Details  lisinopril (PRINIVIL,ZESTRIL) 5 MG tablet Take 1 tablet (5 mg total) by mouth daily. Qty: 30 tablet, Refills: 3      CONTINUE these medications which have NOT CHANGED   Details  aspirin EC 81 MG tablet Take 1 tablet (81 mg total) by mouth daily. Qty: 60 tablet, Refills: 2    baclofen (LIORESAL) 20 MG tablet Take 20 mg by mouth every 8 (eight) hours.    Canagliflozin-Metformin HCl (INVOKAMET) [phone] MG TABS Take 1 tablet by mouth 2 (two) times daily.    DULoxetine (CYMBALTA) 30 MG capsule Take 30 mg by mouth daily.    Gabapentin Enacarbil (HORIZANT) 600 MG TBCR Take 600 mg by mouth every morning.     insulin aspart (NOVOLOG FLEXPEN) 100 UNIT/ML FlexPen Inject 14-28 Units into the skin 3 (three) times daily with meals.    insulin detemir (LEVEMIR) 100 unit/ml SOLN Inject 40 Units into the skin at bedtime.    lidocaine-prilocaine (EMLA) cream Apply 1 application topically daily as needed (pain).    nortriptyline (PAMELOR) 25 MG capsule Take 25 mg by mouth at bedtime.    pantoprazole (PROTONIX) 40 MG tablet Take 1 tablet (40 mg total) by mouth daily. Qty: 30  tablet, Refills: 2   Associated Diagnoses: Gastroesophageal reflux disease without esophagitis    promethazine (PHENERGAN) 12.5 MG tablet Take 12.5 mg by mouth 2 (two) times daily.    tapentadol (NUCYNTA) 50 MG tablet Take 50 mg by mouth daily.      STOP taking these medications     tapentadol (NUCYNTA ER) 100 MG 12 hr tablet          Brief H and P: For complete details please refer to admission H and P, but in briefAngelaMarshallis a 45 y.o.female,With past medical history relevant for diabetes, obesity and family history of premature CAD in multiple first degree relatives who presents to the ED with left-sided chest discomfort radiating to her left armand apparently left leg and jaw. Chest discomfort started less than an hour prior to arrival to the ED, it was associated with nausea and shortness of breath but no emesis. No leg pains no pleuritic symptoms, no fever no chills no productive cough she is a smoker.No palpitations no dizziness no diaphoresis. Please note the patient had a nuclear stress test in February 2015without reversible ischemia. Chest discomfort was described as dull, retracted about 7/10 in intensity, did not get better with rest did not get worse with activity. No diarrhea  Hospital Course:   Atypical Chest Pain- Cardiovascular risk factors include Sedentary lifestyle, obesity, diabetes,tobacco use disorder and family history of premature CAD in first-degree relatives,  - Serial  cardiac enzymes negative  - No acute ST-T wave changes just above ischemia  - Patient had nuclear stress test in February 2015 without reversible ischemia,however there wasa question of possible fixed defect around the apex,EF on gated images from that particular stress test was around 60%. CTA chest is negative for PE -Cardiology was consulted. Patient underwent nuclear medicine stress test which showed no reversible ischemia, mild septal hypokinesis, EF of 51%, low risk stress  test finding. Echo showed EF 55-60%.   - Given patient continued to have chest pain, patient underwent cardiac catheterization which showed normal coronary anatomy, RCA has anterior takeoff, good LV function.   DM-continue Levemir insulin 40 units daily, Use Novolog/Humalog Sliding scale insulin with Accu-Cheks/Fingersticks as ordered  tobacco use disorder-smoking cessation strongly advised, patient is not ready to set a quit date  chronic pain syndrome-continue Nucynta, Cymbalta, Baclofen, nortriptyline and Gabapentin  Leukocytosis-likely reactive -  etiology unclear, UA negative for UTI, CT angiogram chest showed no pneumonia.    Anemia-microcytic and hypochromic, most likely secondary to menorrhagia, LMP 3 weeks ago with heavy clots - Stool occult test negative, follow-up with PCP for pelvic ultrasound and further evaluation of menorrhagia. Baseline hemoglobin around 11   Day of Discharge BP (!) 143/76   Pulse 76   Temp 98 F (36.7 C) (Oral)   Resp 16   Ht 5' 9"  (1.753 m)   Wt 110.9 kg (244 lb 8 oz)   LMP 10/07/2015   SpO2 95%   BMI 36.11 kg/m   Physical Exam: General: Alert and awake oriented x3 not in any acute distress. HEENT: anicteric sclera, pupils reactive to light and accommodation CVS: S1-S2 clear no murmur rubs or gallops Chest: clear to auscultation bilaterally, no wheezing rales or rhonchi Abdomen: soft nontender, nondistended, normal bowel sounds Extremities: no cyanosis, clubbing or edema noted bilaterally Neuro: Cranial nerves II-XII intact, no focal neurological deficits   The results of significant diagnostics from this hospitalization (including imaging, microbiology, ancillary and laboratory) are listed below for reference.    LAB RESULTS: Basic Metabolic Panel:  Recent Labs Lab 10/07/15 0441 10/08/15 0525  NA 136 136  K 4.2 3.8  CL 103 104  CO2 24 23  GLUCOSE 146* 171*  BUN <5* <5*  CREATININE 0.45 0.46  CALCIUM 8.8* 8.9    Liver Function Tests: No results for input(s): AST, ALT, ALKPHOS, BILITOT, PROT, ALBUMIN in the last 168 hours. No results for input(s): LIPASE, AMYLASE in the last 168 hours. No results for input(s): AMMONIA in the last 168 hours. CBC:  Recent Labs Lab 10/06/15 0451  10/07/15 0441 10/08/15 0525  WBC 14.8*  < > 11.0* 10.9*  NEUTROABS 10.3*  --   --   --   HGB 10.1*  < > 10.4* 10.8*  HCT 34.1*  < > 35.6* 36.8  MCV 68.1*  < > 68.6* 68.7*  PLT 405*  < > 451* 515*  < > = values in this interval not displayed. Cardiac Enzymes:  Recent Labs Lab 10/06/15 1559 10/06/15 2143  TROPONINI <0.03 <0.03   BNP: Invalid input(s): POCBNP CBG:  Recent Labs Lab 10/08/15 0736 10/08/15 1141  GLUCAP 152* 142*    Significant Diagnostic Studies:  Ct Angio Chest Pe W Or Wo Contrast  Result Date: 10/06/2015 CLINICAL DATA:  45 year old female with left-sided chest pain radiating to the back. EXAM: CT ANGIOGRAPHY CHEST WITH CONTRAST TECHNIQUE: Multidetector CT imaging of the chest was performed using the standard protocol during bolus administration of intravenous  contrast. Multiplanar CT image reconstructions and MIPs were obtained to evaluate the vascular anatomy. CONTRAST:  100 cc Isovue 370 COMPARISON:  Chest CT dated 05/31/2015 FINDINGS: There is mild diffuse airspace haziness of the upper lung as well as minimal bibasilar subpleural densities most consistent with atelectatic changes. There is no focal consolidation, pleural effusion, or pneumothorax. The central airways are patent. There is mildly dilated appearance of the ascending aorta, likely related to motion artifact. There is no evidence of aortic dissection. Evaluation of the pulmonary arteries is limited due to suboptimal opacification of the peripheral branches and streak artifact caused by patient's arms. No definite pulmonary artery embolus identified. Top-normal cardiac size. No pericardial effusion. There is no hilar or mediastinal  adenopathy. The esophagus is grossly unremarkable. No thyroid nodules identified. There is no axillary adenopathy. The chest wall soft tissues appear unremarkable. T8 hemangioma. The osseous structures are intact. The visualized upper abdomen appears unremarkable. Review of the MIP images confirms the above findings. IMPRESSION: No CT evidence of central pulmonary artery embolus. Electronically Signed   By: Anner Crete M.D.   On: 10/06/2015 06:22   Nm Myocar Multi W/spect W/wall Motion / Ef  Result Date: 10/07/2015 CLINICAL DATA:  Chest pain, diabetes, shortness of breath, previous tobacco abuse. EXAM: MYOCARDIAL IMAGING WITH SPECT (REST AND PHARMACOLOGIC-STRESS) GATED LEFT VENTRICULAR WALL MOTION STUDY LEFT VENTRICULAR EJECTION FRACTION TECHNIQUE: Standard myocardial SPECT imaging was performed after resting intravenous injection of 10 mCi [id number]. Subsequently, intravenous infusion of Lexiscan was performed under the supervision of the Cardiology staff. At peak effect of the drug, 30 mCi Tc-951mestamibi was injected intravenously and standard myocardial SPECT imaging was performed. Quantitative gated imaging was also performed to evaluate left ventricular wall motion, and estimate left ventricular ejection fraction. COMPARISON:  None. FINDINGS: Perfusion: No decreased activity in the left ventricle on stress imaging to suggest reversible ischemia or infarction. Wall Motion: Mild septal hypokinesis. No left ventricular dilatation. Left Ventricular Ejection Fraction: 51 % End diastolic volume 14893l End systolic volume 69 ml IMPRESSION: 1. No reversible ischemia or infarction. 2. Mild septal hypokinesis. 3. Left ventricular ejection fraction 51% 4. Low-risk stress test findings*. *2012 Appropriate Use Criteria for Coronary Revascularization Focused Update: J Am Coll Cardiol. 208101;75(1):[email or web address] Electronically Signed   By: D Lucrezia Europe.D.    On: 10/07/2015 16:39     Procedures   Left Heart Cath and Coronary Angiography  Conclusion     The left ventricular systolic function is normal.  LV end diastolic pressure is normal.  The left ventricular ejection fraction is 50-55% by visual estimate.   1. Normal coronary anatomy. The RCA has an anterior take off. 2. Good LV function.  Plan: risk factor modification.      Echo:  Study Conclusions  - Procedure narrative: Transthoracic echocardiography. Technically   difficult study with reduced echocardiographic windows.   Intravenous contrast (Definity) was administered. - Left ventricle: The cavity size was moderately dilated. Wall   thickness was normal. Systolic function was normal. The estimated   ejection fraction was in the range of 55% to 60%. Wall motion is   grossly normal, when reviewing contrast images. Doppler   parameters are consistent with abnormal left ventricular   relaxation (grade 1 diastolic dysfunction). The E/e&' ratio is   between 8-15, suggesting indeterminate LV filling pressure. - Mitral valve: Poorly visualized. There was no significant   regurgitation. - Left atrium: The atrium was normal in size.  Impressions:  - Technically difficult  study. Definity echo contrast was given.   LVEF is stable at 55-60%, compared to the prior echo in 2014. No   obvious new wall motion abnormalities noted.  Disposition and Follow-up: Discharge Instructions    Increase activity slowly    Complete by:  As directed       DISPOSITION: home    [place]    HOUT, BRITTANY, PA-C. Schedule an appointment as soon as possible for a visit in 2 week(s).   Specialty:  Physician Assistant Contact information: [place] [address] [phone]            Time spent on Discharge: 28mns   Signed:   Aerial Dilley M.D. [facility] 10/08/2015, 3:27 PM Pager: [phone]

## 2015-10-08 NOTE — Progress Notes (Addendum)
Pt discharged home with husband.  Reviewed discharge instructions and education, all questions answered.  Pt educated about radial site care as well.

## 2015-10-08 NOTE — H&P (View-Only) (Signed)
Reason for Consult: chest pain   Referring Physician: Dr. Tana Coast   PCP:  No primary care provider on file.  Primary Cardiologist:New Dr. Rose Phi is an 45 y.o. female.    Chief Complaint: chest pain   HPI:  Asked to see 25 YOF with no Cardiac hx herself and neg nuc study 2015 but sedentary lifestyle, obesity, diabetes, tobacco use disorder and family history of premature CAD in first-degree relatives. Her brother had MI in his 72s and her mother in her 70s.  She was admitted with chest pain with radiation to Lt arm and Lt leg and jaw pain.  + nausea and SOB, and diaphoresis.  Troponins have been negative.  Currently she continues with episodes of chest discomfort.  Nuc with EF of 51% and septal hypokinesis.  Discussed with Dr. Meda Coffee and this is different from 2015 with EF 60%.  She has never had a cardiac cath. She has had a tubal ligation and chole.. She had morphine yesterday.  Pain now comes and goes.  Other hx as below.  EKG without acute changes.    She has a hx of seizures and has episodes of syncope "sizure" a couple times a month. She also notes her heart racing at times.    Past Medical History:  Diagnosis Date  . Anemia   . Anxiety   . Arthritis    "joints" (04/17/2013)  . Chronic back pain    "all over" (04/17/2013)  . Crohn disease ([place])   . Daily headache   . Depression   . Diabetes type 2, uncontrolled ([place])   . Epileptic ([place])    "had 1-2/night; started RX; last one was 01/2014"  . GERD (gastroesophageal reflux disease)   . Migraine    "q day" (04/17/2013)  . MS (multiple sclerosis) ([place])   . Pneumonia    "about 10 times in my lifetime" (04/17/2013)    Past Surgical History:  Procedure Laterality Date  . CHOLECYSTECTOMY  1994  . TUBAL LIGATION  1993    Family History  Problem Relation Age of Onset  . Diabetes Mother   . Heart disease Mother   . Stroke Mother   . Heart failure Mother   . Heart disease Father   . Stroke Father    . Heart failure Father   . Heart attack Brother   . Healthy Brother    Social History:  reports that she has been smoking Cigarettes.  She has a 10.50 pack-year smoking history. She has never used smokeless tobacco. She reports that she does not drink alcohol or use drugs.  Allergies:  Allergies  Allergen Reactions  . Codeine Hives, Itching and Palpitations  . Peanut-Containing Drug Products Anaphylaxis  . Penicillins Hives, Itching and Nausea And Vomiting    OUTPATIENT MEDICATIONS: No current facility-administered medications on file prior to encounter.    Current Outpatient Prescriptions on File Prior to Encounter  Medication Sig Dispense Refill  . aspirin EC 81 MG tablet Take 1 tablet (81 mg total) by mouth daily. 60 tablet 2  . baclofen (LIORESAL) 20 MG tablet Take 20 mg by mouth every 8 (eight) hours.    . DULoxetine (CYMBALTA) 30 MG capsule Take 30 mg by mouth daily.    . Gabapentin Enacarbil (HORIZANT) 600 MG TBCR Take 600 mg by mouth every morning.     . pantoprazole (PROTONIX) 40 MG tablet Take 1 tablet (40 mg total) by mouth daily. 30 tablet  2  . promethazine (PHENERGAN) 12.5 MG tablet Take 12.5 mg by mouth 2 (two) times daily.     CURRENT MEDICATIONS:  Results for orders placed or performed during the hospital encounter of 10/06/15 (from the past 48 hour(s))  CBC with Differential/Platelet     Status: Abnormal   Collection Time: 10/06/15  4:51 AM  Result Value Ref Range   WBC 14.8 (H) 4.0 - 10.5 K/uL   RBC 5.01 3.87 - 5.11 MIL/uL   Hemoglobin 10.1 (L) 12.0 - 15.0 g/dL   HCT 34.1 (L) 36.0 - 46.0 %   MCV 68.1 (L) 78.0 - 100.0 fL   MCH 20.2 (L) 26.0 - 34.0 pg   MCHC 29.6 (L) 30.0 - 36.0 g/dL   RDW 19.4 (H) 11.5 - 15.5 %   Platelets 405 (H) 150 - 400 K/uL   Neutrophils Relative % 70 %   Lymphocytes Relative 20 %   Monocytes Relative 8 %   Eosinophils Relative 2 %   Basophils Relative 0 %   Neutro Abs 10.3 (H) 1.7 - 7.7 K/uL   Lymphs Abs 3.0 0.7 - 4.0 K/uL    Monocytes Absolute 1.2 (H) 0.1 - 1.0 K/uL   Eosinophils Absolute 0.3 0.0 - 0.7 K/uL   Basophils Absolute 0.0 0.0 - 0.1 K/uL   RBC Morphology POLYCHROMASIA PRESENT    WBC Morphology ATYPICAL LYMPHOCYTES   Basic metabolic panel     Status: Abnormal   Collection Time: 10/06/15  4:51 AM  Result Value Ref Range   Sodium 126 (L) 135 - 145 mmol/L   Potassium 3.3 (L) 3.5 - 5.1 mmol/L   Chloride 90 (L) 101 - 111 mmol/L   CO2 25 22 - 32 mmol/L   Glucose, Bld 158 (H) 65 - 99 mg/dL   BUN <5 (L) 6 - 20 mg/dL   Creatinine, Ser 0.49 0.44 - 1.00 mg/dL   Calcium 8.6 (L) 8.9 - 10.3 mg/dL   GFR calc non Af Amer >60 >60 mL/min   GFR calc Af Amer >60 >60 mL/min    Comment: (NOTE) The eGFR has been calculated using the CKD EPI equation. This calculation has not been validated in all clinical situations. eGFR's persistently <60 mL/min signify possible Chronic Kidney Disease.    Anion gap 11 5 - 15  I-stat chem 8, ed     Status: Abnormal   Collection Time: 10/06/15  5:00 AM  Result Value Ref Range   Sodium 129 (L) 135 - 145 mmol/L   Potassium 3.4 (L) 3.5 - 5.1 mmol/L   Chloride 89 (L) 101 - 111 mmol/L   BUN <3 (L) 6 - 20 mg/dL   Creatinine, Ser 0.40 (L) 0.44 - 1.00 mg/dL   Glucose, Bld 160 (H) 65 - 99 mg/dL   Calcium, Ion 1.09 (L) 1.13 - 1.30 mmol/L   TCO2 25 0 - 100 mmol/L   Hemoglobin 12.2 12.0 - 15.0 g/dL   HCT 36.0 36.0 - 46.0 %  I-stat troponin, ED     Status: None   Collection Time: 10/06/15  5:00 AM  Result Value Ref Range   Troponin i, poc 0.01 0.00 - 0.08 ng/mL   Comment 3            Comment: Due to the release kinetics of cTnI, a negative result within the first hours of the onset of symptoms does not rule out myocardial infarction with certainty. If myocardial infarction is still suspected, repeat the test at appropriate intervals.   I-Stat Beta  hCG blood, ED ([facility], [facility], [facility] only)     Status: None   Collection Time: 10/06/15  5:09 AM  Result Value Ref Range   I-stat hCG,  quantitative <5.0 <5 mIU/mL   Comment 3            Comment:   GEST. AGE      CONC.  (mIU/mL)   <=1 WEEK        5 - 50     2 WEEKS       50 - 500     3 WEEKS       100 - 10,000     4 WEEKS     1,000 - 30,000        FEMALE AND NON-PREGNANT FEMALE:     LESS THAN 5 mIU/mL   Culture, blood (Routine X 2) w Reflex to ID Panel     Status: None (Preliminary result)   Collection Time: 10/06/15  8:25 AM  Result Value Ref Range   Specimen Description BLOOD RIGHT HAND    Special Requests BOTTLES DRAWN AEROBIC AND ANAEROBIC  5CC    Culture NO GROWTH 1 DAY    Report Status PENDING   Culture, blood (Routine X 2) w Reflex to ID Panel     Status: None (Preliminary result)   Collection Time: 10/06/15  8:30 AM  Result Value Ref Range   Specimen Description BLOOD LEFT HAND    Special Requests BOTTLES DRAWN AEROBIC AND ANAEROBIC  5CC    Culture NO GROWTH 1 DAY    Report Status PENDING   POC occult blood, ED     Status: None   Collection Time: 10/06/15  9:43 AM  Result Value Ref Range   Fecal Occult Bld NEGATIVE NEGATIVE  Urinalysis, Routine w reflex microscopic (not at [facility])     Status: Abnormal   Collection Time: 10/06/15  9:58 AM  Result Value Ref Range   Color, Urine YELLOW YELLOW   APPearance CLEAR CLEAR   Specific Gravity, Urine 1.033 (H) 1.005 - 1.030   pH 5.5 5.0 - 8.0   Glucose, UA >1000 (A) NEGATIVE mg/dL   Hgb urine dipstick MODERATE (A) NEGATIVE   Bilirubin Urine NEGATIVE NEGATIVE   Ketones, ur NEGATIVE NEGATIVE mg/dL   Protein, ur NEGATIVE NEGATIVE mg/dL   Nitrite NEGATIVE NEGATIVE   Leukocytes, UA NEGATIVE NEGATIVE  Urine culture     Status: Abnormal   Collection Time: 10/06/15  9:58 AM  Result Value Ref Range   Specimen Description URINE, CLEAN CATCH    Special Requests Normal    Culture <10,000 COLONIES/mL INSIGNIFICANT GROWTH (A)    Report Status 10/07/2015 FINAL   Urine microscopic-add on     Status: Abnormal   Collection Time: 10/06/15  9:58 AM  Result Value Ref Range    Squamous Epithelial / LPF 0-5 (A) NONE SEEN   WBC, UA NONE SEEN 0 - 5 WBC/hpf   RBC / HPF 0-5 0 - 5 RBC/hpf   Bacteria, UA NONE SEEN NONE SEEN   Urine-Other YEAST PRESENT   CBC     Status: Abnormal   Collection Time: 10/06/15 10:23 AM  Result Value Ref Range   WBC 11.7 (H) 4.0 - 10.5 K/uL   RBC 4.81 3.87 - 5.11 MIL/uL   Hemoglobin 9.7 (L) 12.0 - 15.0 g/dL    Comment: REPEATED TO VERIFY DELTA CHECK NOTED    HCT 32.7 (L) 36.0 - 46.0 %   MCV 68.0 (L) 78.0 - 100.0  fL   MCH 20.2 (L) 26.0 - 34.0 pg   MCHC 29.7 (L) 30.0 - 36.0 g/dL   RDW 19.4 (H) 11.5 - 15.5 %   Platelets 364 150 - 400 K/uL  Creatinine, serum     Status: Abnormal   Collection Time: 10/06/15 10:23 AM  Result Value Ref Range   Creatinine, Ser 0.41 (L) 0.44 - 1.00 mg/dL   GFR calc non Af Amer >60 >60 mL/min   GFR calc Af Amer >60 >60 mL/min    Comment: (NOTE) The eGFR has been calculated using the CKD EPI equation. This calculation has not been validated in all clinical situations. eGFR's persistently <60 mL/min signify possible Chronic Kidney Disease.   Troponin I (q 6hr x 3)     Status: None   Collection Time: 10/06/15 10:23 AM  Result Value Ref Range   Troponin I <0.03 <0.03 ng/mL  CBG monitoring, ED     Status: Abnormal   Collection Time: 10/06/15  2:02 PM  Result Value Ref Range   Glucose-Capillary 101 (H) 65 - 99 mg/dL  Troponin I (q 6hr x 3)     Status: None   Collection Time: 10/06/15  3:59 PM  Result Value Ref Range   Troponin I <0.03 <0.03 ng/mL  Glucose, capillary     Status: Abnormal   Collection Time: 10/06/15  4:48 PM  Result Value Ref Range   Glucose-Capillary 111 (H) 65 - 99 mg/dL  Glucose, capillary     Status: Abnormal   Collection Time: 10/06/15  9:40 PM  Result Value Ref Range   Glucose-Capillary 167 (H) 65 - 99 mg/dL  Troponin I (q 6hr x 3)     Status: None   Collection Time: 10/06/15  9:43 PM  Result Value Ref Range   Troponin I <0.03 <0.03 ng/mL  Basic metabolic panel     Status:  Abnormal   Collection Time: 10/07/15  4:41 AM  Result Value Ref Range   Sodium 136 135 - 145 mmol/L    Comment: DELTA CHECK NOTED   Potassium 4.2 3.5 - 5.1 mmol/L    Comment: DELTA CHECK NOTED   Chloride 103 101 - 111 mmol/L   CO2 24 22 - 32 mmol/L   Glucose, Bld 146 (H) 65 - 99 mg/dL   BUN <5 (L) 6 - 20 mg/dL   Creatinine, Ser 0.45 0.44 - 1.00 mg/dL   Calcium 8.8 (L) 8.9 - 10.3 mg/dL   GFR calc non Af Amer >60 >60 mL/min   GFR calc Af Amer >60 >60 mL/min    Comment: (NOTE) The eGFR has been calculated using the CKD EPI equation. This calculation has not been validated in all clinical situations. eGFR's persistently <60 mL/min signify possible Chronic Kidney Disease.    Anion gap 9 5 - 15  CBC     Status: Abnormal   Collection Time: 10/07/15  4:41 AM  Result Value Ref Range   WBC 11.0 (H) 4.0 - 10.5 K/uL   RBC 5.19 (H) 3.87 - 5.11 MIL/uL   Hemoglobin 10.4 (L) 12.0 - 15.0 g/dL   HCT 35.6 (L) 36.0 - 46.0 %   MCV 68.6 (L) 78.0 - 100.0 fL   MCH 20.0 (L) 26.0 - 34.0 pg   MCHC 29.2 (L) 30.0 - 36.0 g/dL   RDW 19.8 (H) 11.5 - 15.5 %   Platelets 451 (H) 150 - 400 K/uL  Glucose, capillary     Status: Abnormal   Collection Time: 10/07/15  7:50  AM  Result Value Ref Range   Glucose-Capillary 151 (H) 65 - 99 mg/dL   Comment 1 Notify RN    Comment 2 Document in Chart   D-dimer, quantitative (not at [facility])     Status: Abnormal   Collection Time: 10/07/15 10:50 AM  Result Value Ref Range   D-Dimer, Quant 0.78 (H) 0.00 - 0.50 ug/mL-FEU    Comment: (NOTE) At the manufacturer cut-off of 0.50 ug/mL FEU, this assay has been documented to exclude PE with a sensitivity and negative predictive value of 97 to 99%.  At this time, this assay has not been approved by the FDA to exclude DVT/VTE. Results should be correlated with clinical presentation.   Glucose, capillary     Status: Abnormal   Collection Time: 10/07/15 11:26 AM  Result Value Ref Range   Glucose-Capillary 147 (H) 65 - 99 mg/dL    Comment 1 Notify RN    Comment 2 Document in Chart   Glucose, capillary     Status: Abnormal   Collection Time: 10/07/15  4:41 PM  Result Value Ref Range   Glucose-Capillary 178 (H) 65 - 99 mg/dL   Comment 1 Notify RN    Comment 2 Document in Chart    Ct Angio Chest Pe W Or Wo Contrast  Result Date: 10/06/2015 CLINICAL DATA:  45 year old female with left-sided chest pain radiating to the back. EXAM: CT ANGIOGRAPHY CHEST WITH CONTRAST TECHNIQUE: Multidetector CT imaging of the chest was performed using the standard protocol during bolus administration of intravenous contrast. Multiplanar CT image reconstructions and MIPs were obtained to evaluate the vascular anatomy. CONTRAST:  100 cc Isovue 370 COMPARISON:  Chest CT dated 05/31/2015 FINDINGS: There is mild diffuse airspace haziness of the upper lung as well as minimal bibasilar subpleural densities most consistent with atelectatic changes. There is no focal consolidation, pleural effusion, or pneumothorax. The central airways are patent. There is mildly dilated appearance of the ascending aorta, likely related to motion artifact. There is no evidence of aortic dissection. Evaluation of the pulmonary arteries is limited due to suboptimal opacification of the peripheral branches and streak artifact caused by patient's arms. No definite pulmonary artery embolus identified. Top-normal cardiac size. No pericardial effusion. There is no hilar or mediastinal adenopathy. The esophagus is grossly unremarkable. No thyroid nodules identified. There is no axillary adenopathy. The chest wall soft tissues appear unremarkable. T8 hemangioma. The osseous structures are intact. The visualized upper abdomen appears unremarkable. Review of the MIP images confirms the above findings. IMPRESSION: No CT evidence of central pulmonary artery embolus. Electronically Signed   By: Anner Crete M.D.   On: 10/06/2015 06:22   Nm Myocar Multi W/spect W/wall Motion /  Ef  Result Date: 10/07/2015 CLINICAL DATA:  Chest pain, diabetes, shortness of breath, previous tobacco abuse. EXAM: MYOCARDIAL IMAGING WITH SPECT (REST AND PHARMACOLOGIC-STRESS) GATED LEFT VENTRICULAR WALL MOTION STUDY LEFT VENTRICULAR EJECTION FRACTION TECHNIQUE: Standard myocardial SPECT imaging was performed after resting intravenous injection of 10 mCi Tc-15msestamibi. Subsequently, intravenous infusion of Lexiscan was performed under the supervision of the Cardiology staff. At peak effect of the drug, 30 mCi Tc-99mestamibi was injected intravenously and standard myocardial SPECT imaging was performed. Quantitative gated imaging was also performed to evaluate left ventricular wall motion, and estimate left ventricular ejection fraction. COMPARISON:  None. FINDINGS: Perfusion: No decreased activity in the left ventricle on stress imaging to suggest reversible ischemia or infarction. Wall Motion: Mild septal hypokinesis. No left ventricular dilatation. Left Ventricular Ejection  Fraction: 51 % End diastolic volume 675 ml End systolic volume 69 ml IMPRESSION: 1. No reversible ischemia or infarction. 2. Mild septal hypokinesis. 3. Left ventricular ejection fraction 51% 4. Low-risk stress test findings*. *2012 Appropriate Use Criteria for Coronary Revascularization Focused Update: J Am Coll Cardiol. 9163;84(6):659-935. http://content.airportbarriers.com.aspx?articleid=[email or web address] Electronically Signed   By: Lucrezia Europe M.D.   On: 10/07/2015 16:39    ROS: General:no colds or fevers, + weight gain from 2015 by 54 lbs. Skin:no rashes or ulcers HEENT:no blurred vision, no congestion CV:see HPI PUL:see HPI GI:no diarrhea constipation or melena, no indigestion GU:no hematuria, no dysuria MS:no joint pain, no claudication Neuro:+ syncope- syncope, no lightheadedness Endo:+ diabetes, no thyroid disease  Blood pressure 134/77, pulse 76, temperature 98.4 F (36.9 C), temperature source Oral, resp. rate 18,  height _0  (1.753 m), weight 252 lb 3.2 oz (114.4 kg), last menstrual period 10/07/2015, SpO2 97 %.  Wt Readings from Last 3 Encounters:  10/07/15 252 lb 3.2 oz (114.4 kg)  07/22/13 198 lb (89.8 kg)  06/17/13 203 lb (92.1 kg)   TS:VXBLTJQ:ZESPQZRA affect, NAD Skin:Warm and dry, brisk capillary refill HEENT:normocephalic, sclera clear, mucus membranes moist Neck:supple, no JVD, no bruits  Heart:S1S2 RRR without murmur, gallup, rub or click Lungs:clear without rales, rhonchi, or wheezes QTM:AUQJ, non tender, + BS, do not palpate liver spleen or masses Ext:no lower ext edema, 2+ pedal pulses, 2+ radial pulses Neuro:alert and oriented X 3, MAE, follows commands, + facial symmetry  Assessment/Plan Principal Problem:   Atypical chest pain Active Problems:   Tobacco abuse   DM2 (diabetes mellitus, type 2) (HCC)  Chest pain with decrease of EF on nuc study from 2015 and multiple cardiac risk factors for CAD including family hx of premature CAD.  Discussed with Dr. Meda Coffee will plan for cardiac cath in AM Dr. Meda Coffee to discuss.  --metoprolol has been added   DM2- per IM  Tobacco use, needs to stop even if normal cardiac cath.    HLD will check fasting lipids in AM and continue lipitor for now.      Hx seizures   Cecilie Kicks  Nurse Practitioner Certified [place] Pager [phone] or after 5pm or weekends call [phone] 10/07/2015, 5:02 PM  The patient was seen, examined and discussed with Cecilie Kicks, NP and I agree with the above.   A very pleasant 45 year old female with prior medical history of long-standing diabetes, obesity, physical inactivity, ongoing smoking and very significant family history of coronary artery disease that includes myocardial infarction in both of her parents in their 24s and at age of 46 in her younger brother. The patient presented with chest pain with some typical and some atypical features however also complains of worsening  functional capacity and worsening shortness of breath on exertion. She has also noticed lower extremity edema in the last 1-2 months. Her stress test is affected by artifact but appears to have reversible defect consistent with ischemia in the LAD territory. Her LVEF is calculated at 51%, we will order an echocardiogram to confirm and evaluate for systolic and diastolic function. We'll check her lipids, she has never been on any cholesterol medication. Her prior cholesterol was checked in 2015 with triglycerides 193, HDL 22 and LDL of 56.  We'll schedule her for left cardiac catheterization for tomorrow.  Ena Dawley 10/07/2015

## 2015-10-08 NOTE — Progress Notes (Signed)
Patient Profile: 45 y/o female with h/o sedentary lifestyle, obesity, diabetes,tobacco use disorder and family history of premature CAD in first-degree relatives, admitted for CP evaluation. Cardiac enzymes negative x 3. NST 10/07/15 affected by artifact but appears to have reversible defect consistent with ischemia in the LAD territory. Plan is for definitive LHC.   Subjective: Still with mild intermitted CP.   Objective: Vital signs in last 24 hours: Temp:  [98 F (36.7 C)-98.5 F (36.9 C)] 98 F (36.7 C) (08/08 0508) Pulse Rate:  [72-78] 72 (08/08 0508) Resp:  [18] 18 (08/08 0508) BP: (123-149)/(53-77) 149/77 (08/08 0508) SpO2:  [96 %-97 %] 96 % (08/08 0508) Weight:  [244 lb 8 oz (110.9 kg)] 244 lb 8 oz (110.9 kg) (08/08 0508) Last BM Date: 10/06/15  Intake/Output from previous day: 08/07 0701 - 08/08 0700 In: 240 [P.O.:240] Out: -  Intake/Output this shift: No intake/output data recorded.  Medications . atorvastatin  40 mg Oral q1800  . baclofen  20 mg Oral Q8H  . DULoxetine  30 mg Oral Daily  . gabapentin  300 mg Oral BID  . insulin aspart  0-9 Units Subcutaneous TID WC  . insulin detemir  40 Units Subcutaneous QHS  . metoprolol tartrate  25 mg Oral BID  . nortriptyline  25 mg Oral QHS  . pantoprazole  40 mg Oral Daily  . promethazine  12.5 mg Oral BID  . senna  1 tablet Oral BID  . sodium chloride flush  3 mL Intravenous Q12H  . tapentadol  75 mg Oral TID   PE: General appearance: alert, cooperative, no distress and moderately obese Neck: no carotid bruit and no JVD Lungs: clear to auscultation bilaterally Heart: regular rate and rhythm, S1, S2 normal, no murmur, click, rub or gallop Extremities: no LEE Pulses: 2+ and symmetric Skin: warm and dry Neurologic: Grossly normal  Lab Results:   Recent Labs  10/06/15 1023 10/07/15 0441 10/08/15 0525  WBC 11.7* 11.0* 10.9*  HGB 9.7* 10.4* 10.8*  HCT 32.7* 35.6* 36.8  PLT 364 451* 515*   BMET  Recent  Labs  10/06/15 0451 10/06/15 0500 10/06/15 1023 10/07/15 0441 10/08/15 0525  NA 126* 129*  --  136 136  K 3.3* 3.4*  --  4.2 3.8  CL 90* 89*  --  103 104  CO2 25  --   --  24 23  GLUCOSE 158* 160*  --  146* 171*  BUN <5* <3*  --  <5* <5*  CREATININE 0.49 0.40* 0.41* 0.45 0.46  CALCIUM 8.6*  --   --  8.8* 8.9   PT/INR  Recent Labs  10/08/15 0525  LABPROT 13.6  INR 1.04   Cholesterol  Recent Labs  10/08/15 0525  CHOL 154   Cardiac Panel (last 3 results)  Recent Labs  10/06/15 1023 10/06/15 1559 10/06/15 2143  TROPONINI <0.03 <0.03 <0.03    Studies/Results: FINDINGS: Perfusion: No decreased activity in the left ventricle on stress imaging to suggest reversible ischemia or infarction.  Wall Motion: Mild septal hypokinesis. No left ventricular dilatation.  Left Ventricular Ejection Fraction: 51 %  End diastolic volume 502 ml  End systolic volume 69 ml  IMPRESSION: 1. No reversible ischemia or infarction.  2. Mild septal hypokinesis.  3. Left ventricular ejection fraction 51%  4. Low-risk stress test findings*.  **Her stress test is affected by artifact but appears to have reversible defect consistent with ischemia in the LAD territory. Her LVEF is calculated at 51%  Assessment/Plan  Principal Problem:   Atypical chest pain Active Problems:   Tobacco abuse   DM2 (diabetes mellitus, type 2) (HCC)   Chest pain   Hypochloremia   Hyponatremia   1. Chest Pain: she has ruled out for MI with negative cardiac enzymes x 3, however her stress test is affected by artifact but appears to have reversible defect consistent with ischemia in the LAD territory. Her LVEF is calculated at 51%, by nuclear study. Plan for definitive LHC today +/- PCI. 2D echo also pending to confirm and evaluate for systolic and diastolic function. VSS. Telemetry shows NSR. No arhythmias. Continue medial therapy with ASA + statin and metoprolol. Risk reduction through  management of risk factors (T2DM, DLD, obesity).   2. DLD: fasting lipid panel shows LDL at 94 mg/dL. HDL is low at 26 mg/dL. TG mildly elevated at 169 mg/dL. If evidence of CAD on LHC, would benefit from LDL reduction with statin therapy to achieve LDL goal of <70 mg/dL.   3. DM: Hgb A1c pending. Management per primary team.    LOS: 1 day   Andrea Wade 10/08/2015 7:44 AM  The patient was seen, examined and discussed with Andrea M. Rosita Fire, PA-C and I agree with the above.   The patient with multiple significant risk factors for coronary artery disease including long-standing diabetes, ongoing smoking, significant family history of premature coronary artery disease, hyperlipidemia, obesity physical inactivity were presented with chest pain and underwent cardiac catheter that showed normal coronaries and normal LVEF. He was recommended to start statins therapy for LDL less than 70 as she is a diabetic for 45 years old and also has elevated triglycerides. Atorvastatin 10-20 mg daily would be appropriate. We will arrange for follow-up in our clinic. Her BP is elevated, I would add lisinopril 5 mg daily to her regimen.  Andrea Wade 10/08/2015

## 2015-10-08 NOTE — Interval H&P Note (Signed)
History and Physical Interval Note:  10/08/2015 10:35 AM  Arnetha Gula  has presented today for surgery, with the diagnosis of cp - as  The various methods of treatment have been discussed with the patient and family. After consideration of risks, benefits and other options for treatment, the patient has consented to  Procedure(s): Left Heart Cath and Coronary Angiography (N/A) as a surgical intervention .  The patient's history has been reviewed, patient examined, no change in status, stable for surgery.  I have reviewed the patient's chart and labs.  Questions were answered to the patient's satisfaction.   Cath Lab Visit (complete for each Cath Lab visit)  Clinical Evaluation Leading to the Procedure:   ACS: Yes.    Non-ACS:    Anginal Classification: CCS II  Anti-ischemic medical therapy: Minimal Therapy (1 class of medications)  Non-Invasive Test Results: Intermediate-risk stress test findings: cardiac mortality 1-3%/year  Prior CABG: No previous CABG        Collier [facility]ina [facility] 10/08/2015 10:35 AM

## 2015-10-08 NOTE — Plan of Care (Signed)
Problem: Safety: Goal: Ability to remain free from injury will improve Outcome: Completed/Met Date Met: 10/08/15 Pt has remained free from injury during this admission   Problem: Physical Regulation: Goal: Will remain free from infection Outcome: Completed/Met Date Met: 10/08/15 Pt has remained free from infection during this admission   Problem: Fluid Volume: Goal: Ability to maintain a balanced intake and output will improve Outcome: Completed/Met Date Met: 10/08/15 Pt has adequate intake and output   Problem: Phase I Progression Outcomes Goal: Anginal pain relieved Outcome: Completed/Met Date Met: 10/08/15 Pt has remained free from chest pain   Problem: Phase I Progression Outcomes Goal: Pain controlled with appropriate interventions Outcome: Completed/Met Date Met: 10/08/15 Pt remains free from chest pain  Goal: Initial discharge plan identified Outcome: Completed/Met Date Met: 10/08/15 Pt being discharged home today  Goal: Vascular site scale level 0 - I Vascular Site Scale Level 0: No bruising/bleeding/hematoma Level I (Mild): Bruising/Ecchymosis, minimal bleeding/ooozing, palpable hematoma < 3 cm Level II (Moderate): Bleeding not affecting hemodynamic parameters, pseudoaneurysm, palpable hematoma > 3 cm Level III  (Severe) Bleeding which affects hemodynamic parameters or retroperitoneal hemorrhage   Outcome: Completed/Met Date Met: 10/08/15 Right radial site is a level zero and TR band has been removed   Problem: Phase II Progression Outcomes Goal: Tolerates diet Outcome: Completed/Met Date Met: 10/08/15 Pt able to tolerate regular diet with no difficulties

## 2015-10-08 NOTE — Progress Notes (Signed)
  Echocardiogram 2D Echocardiogram with Definity has been performed.  Tresa Res 10/08/2015, 2:16 PM

## 2015-10-09 LAB — HEMOGLOBIN A1C
Hgb A1c MFr Bld: 7.6 % — ABNORMAL HIGH (ref 4.8–5.6)
Mean Plasma Glucose: 171 mg/dL

## 2015-10-10 DIAGNOSIS — G629 Polyneuropathy, unspecified: Secondary | ICD-10-CM | POA: Diagnosis not present

## 2015-10-10 DIAGNOSIS — M5416 Radiculopathy, lumbar region: Secondary | ICD-10-CM | POA: Diagnosis not present

## 2015-10-10 DIAGNOSIS — G894 Chronic pain syndrome: Secondary | ICD-10-CM | POA: Diagnosis not present

## 2015-10-10 DIAGNOSIS — M5136 Other intervertebral disc degeneration, lumbar region: Secondary | ICD-10-CM | POA: Diagnosis not present

## 2015-10-10 DIAGNOSIS — R69 Illness, unspecified: Secondary | ICD-10-CM | POA: Diagnosis not present

## 2015-10-10 DIAGNOSIS — M5030 Other cervical disc degeneration, unspecified cervical region: Secondary | ICD-10-CM | POA: Diagnosis not present

## 2015-10-10 DIAGNOSIS — E119 Type 2 diabetes mellitus without complications: Secondary | ICD-10-CM | POA: Diagnosis not present

## 2015-10-10 DIAGNOSIS — M5134 Other intervertebral disc degeneration, thoracic region: Secondary | ICD-10-CM | POA: Diagnosis not present

## 2015-10-10 DIAGNOSIS — G35 Multiple sclerosis: Secondary | ICD-10-CM | POA: Diagnosis not present

## 2015-10-11 LAB — CULTURE, BLOOD (ROUTINE X 2)
CULTURE: NO GROWTH
Culture: NO GROWTH

## 2015-10-15 DIAGNOSIS — Z6836 Body mass index (BMI) 36.0-36.9, adult: Secondary | ICD-10-CM | POA: Diagnosis not present

## 2015-10-15 DIAGNOSIS — R079 Chest pain, unspecified: Secondary | ICD-10-CM | POA: Diagnosis not present

## 2015-10-15 DIAGNOSIS — E669 Obesity, unspecified: Secondary | ICD-10-CM | POA: Diagnosis not present

## 2015-11-05 DIAGNOSIS — R69 Illness, unspecified: Secondary | ICD-10-CM | POA: Diagnosis not present

## 2015-11-11 DIAGNOSIS — E1165 Type 2 diabetes mellitus with hyperglycemia: Secondary | ICD-10-CM | POA: Diagnosis not present

## 2015-11-19 ENCOUNTER — Emergency Department (HOSPITAL_COMMUNITY)
Admission: EM | Admit: 2015-11-19 | Discharge: 2015-11-20 | Disposition: A | Discharge status: Home / Self Care | Payer: Medicare HMO | Attending: Emergency Medicine | Admitting: Emergency Medicine

## 2015-11-19 ENCOUNTER — Emergency Department (HOSPITAL_COMMUNITY): Payer: Medicare HMO

## 2015-11-19 ENCOUNTER — Encounter (HOSPITAL_COMMUNITY): Payer: Self-pay

## 2015-11-19 DIAGNOSIS — R531 Weakness: Secondary | ICD-10-CM

## 2015-11-19 DIAGNOSIS — M5030 Other cervical disc degeneration, unspecified cervical region: Secondary | ICD-10-CM | POA: Diagnosis not present

## 2015-11-19 DIAGNOSIS — E119 Type 2 diabetes mellitus without complications: Secondary | ICD-10-CM | POA: Diagnosis not present

## 2015-11-19 DIAGNOSIS — Z79899 Other long term (current) drug therapy: Secondary | ICD-10-CM | POA: Insufficient documentation

## 2015-11-19 DIAGNOSIS — Z79891 Long term (current) use of opiate analgesic: Secondary | ICD-10-CM | POA: Diagnosis not present

## 2015-11-19 DIAGNOSIS — R51 Headache: Secondary | ICD-10-CM | POA: Insufficient documentation

## 2015-11-19 DIAGNOSIS — R0789 Other chest pain: Secondary | ICD-10-CM | POA: Insufficient documentation

## 2015-11-19 DIAGNOSIS — Z7982 Long term (current) use of aspirin: Secondary | ICD-10-CM | POA: Insufficient documentation

## 2015-11-19 DIAGNOSIS — M542 Cervicalgia: Secondary | ICD-10-CM

## 2015-11-19 DIAGNOSIS — M5416 Radiculopathy, lumbar region: Secondary | ICD-10-CM | POA: Diagnosis not present

## 2015-11-19 DIAGNOSIS — Z91010 Allergy to peanuts: Secondary | ICD-10-CM | POA: Insufficient documentation

## 2015-11-19 DIAGNOSIS — R079 Chest pain, unspecified: Secondary | ICD-10-CM | POA: Diagnosis not present

## 2015-11-19 DIAGNOSIS — F17210 Nicotine dependence, cigarettes, uncomplicated: Secondary | ICD-10-CM | POA: Insufficient documentation

## 2015-11-19 DIAGNOSIS — G894 Chronic pain syndrome: Secondary | ICD-10-CM | POA: Diagnosis not present

## 2015-11-19 DIAGNOSIS — Z720 Tobacco use: Secondary | ICD-10-CM | POA: Diagnosis not present

## 2015-11-19 DIAGNOSIS — Z794 Long term (current) use of insulin: Secondary | ICD-10-CM | POA: Insufficient documentation

## 2015-11-19 DIAGNOSIS — M6281 Muscle weakness (generalized): Secondary | ICD-10-CM | POA: Diagnosis not present

## 2015-11-19 DIAGNOSIS — M5136 Other intervertebral disc degeneration, lumbar region: Secondary | ICD-10-CM | POA: Diagnosis not present

## 2015-11-19 DIAGNOSIS — M5134 Other intervertebral disc degeneration, thoracic region: Secondary | ICD-10-CM | POA: Diagnosis not present

## 2015-11-19 DIAGNOSIS — R69 Illness, unspecified: Secondary | ICD-10-CM | POA: Diagnosis not present

## 2015-11-19 DIAGNOSIS — G629 Polyneuropathy, unspecified: Secondary | ICD-10-CM | POA: Diagnosis not present

## 2015-11-19 LAB — URINALYSIS, ROUTINE W REFLEX MICROSCOPIC
Bilirubin Urine: NEGATIVE
Hgb urine dipstick: NEGATIVE
KETONES UR: NEGATIVE mg/dL
LEUKOCYTES UA: NEGATIVE
NITRITE: NEGATIVE
PROTEIN: NEGATIVE mg/dL
Specific Gravity, Urine: 1.012 (ref 1.005–1.030)
pH: 7 (ref 5.0–8.0)

## 2015-11-19 LAB — DIFFERENTIAL
BASOS ABS: 0 10*3/uL (ref 0.0–0.1)
Basophils Relative: 0 %
Eosinophils Absolute: 0.1 10*3/uL (ref 0.0–0.7)
Eosinophils Relative: 1 %
LYMPHS ABS: 2.3 10*3/uL (ref 0.7–4.0)
Lymphocytes Relative: 16 %
MONOS PCT: 10 %
Monocytes Absolute: 1.4 10*3/uL — ABNORMAL HIGH (ref 0.1–1.0)
NEUTROS ABS: 10.4 10*3/uL — AB (ref 1.7–7.7)
Neutrophils Relative %: 73 %

## 2015-11-19 LAB — CBC
HCT: 33.6 % — ABNORMAL LOW (ref 36.0–46.0)
HEMOGLOBIN: 9.9 g/dL — AB (ref 12.0–15.0)
MCH: 19.6 pg — ABNORMAL LOW (ref 26.0–34.0)
MCHC: 29.5 g/dL — AB (ref 30.0–36.0)
MCV: 66.5 fL — ABNORMAL LOW (ref 78.0–100.0)
Platelets: 584 10*3/uL — ABNORMAL HIGH (ref 150–400)
RBC: 5.05 MIL/uL (ref 3.87–5.11)
RDW: 19.9 % — ABNORMAL HIGH (ref 11.5–15.5)
WBC: 14.2 10*3/uL — AB (ref 4.0–10.5)

## 2015-11-19 LAB — BASIC METABOLIC PANEL
ANION GAP: 9 (ref 5–15)
BUN: <5 mg/dL — ABNORMAL LOW (ref 6–20)
CALCIUM: 8.8 mg/dL — AB (ref 8.9–10.3)
CO2: 26 mmol/L (ref 22–32)
Chloride: 96 mmol/L — ABNORMAL LOW (ref 101–111)
Creatinine, Ser: 0.44 mg/dL (ref 0.44–1.00)
Glucose, Bld: 82 mg/dL (ref 65–99)
Potassium: 3.3 mmol/L — ABNORMAL LOW (ref 3.5–5.1)
SODIUM: 131 mmol/L — AB (ref 135–145)

## 2015-11-19 LAB — I-STAT TROPONIN, ED
TROPONIN I, POC: 0.01 ng/mL (ref 0.00–0.08)
TROPONIN I, POC: 0 ng/mL (ref 0.00–0.08)

## 2015-11-19 LAB — I-STAT BETA HCG BLOOD, ED (MC, WL, AP ONLY): I-stat hCG, quantitative: <5 m[IU]/mL (ref <5)

## 2015-11-19 LAB — URINE MICROSCOPIC-ADD ON

## 2015-11-19 MED ORDER — METOCLOPRAMIDE HCL 5 MG/ML IJ SOLN
10.0000 mg | Freq: Once | INTRAMUSCULAR | Status: AC
  Administered 2015-11-19: 10 mg via INTRAVENOUS
  Filled 2015-11-19: qty 2

## 2015-11-19 MED ORDER — SODIUM CHLORIDE 0.9 % IV BOLUS (SEPSIS)
1000.0000 mL | Freq: Once | INTRAVENOUS | Status: AC
  Administered 2015-11-19: 1000 mL via INTRAVENOUS

## 2015-11-19 MED ORDER — HYDROMORPHONE HCL 1 MG/ML IJ SOLN
1.0000 mg | Freq: Once | INTRAMUSCULAR | Status: AC
  Administered 2015-11-19: 1 mg via INTRAVENOUS
  Filled 2015-11-19: qty 1

## 2015-11-19 MED ORDER — CYCLOBENZAPRINE HCL 10 MG PO TABS: 10.0000 mg | ORAL_TABLET | Freq: Two times a day (BID) | ORAL | 0 refills | Status: DC | PRN

## 2015-11-19 MED ORDER — DIAZEPAM 5 MG/ML IJ SOLN
10.0000 mg | Freq: Once | INTRAMUSCULAR | Status: AC
  Administered 2015-11-19: 10 mg via INTRAVENOUS
  Filled 2015-11-19: qty 2

## 2015-11-19 MED ORDER — PREDNISONE 10 MG (21) PO TBPK: 10.0000 mg | ORAL_TABLET | Freq: Every day | ORAL | 0 refills | Status: DC

## 2015-11-19 NOTE — Discharge Instructions (Signed)
Please follow-up with your primary care doctor and neurologist regarding your neck pain. Your given muscle relaxants and steroids for your neck pain as needed.  Return for worsening symptoms, including new numbness/weakness, escalating pain or any other symptoms concerning to you.

## 2015-11-19 NOTE — ED Notes (Signed)
Dr. Oleta Mouse at bedside to assess patient

## 2015-11-19 NOTE — ED Notes (Signed)
Spoke to MD. Reported eye droop, weakness all extremities, pain with movements of upper extremities. EKG reviewed

## 2015-11-19 NOTE — ED Triage Notes (Signed)
Pt brought in by EMS due to chest pain. Per EMS pt c/o left sided chest pain radiating down her left arm and left sided headache. Pt has hx of MS. Pt is a&ox4.

## 2015-11-19 NOTE — ED Provider Notes (Signed)
[facility] DEPT Provider Note   CSN: [id number] Arrival date & time: 11/19/15  1810     History   Chief Complaint Chief Complaint  Patient presents with  . Weakness  . Chest Pain    HPI Andrea Wade is a 45 y.o. female.  HPI 45 year old female who presents with generalized weakness and chest pain. She has a history of type 2 diabetes, daily migraine headaches, chronic pain syndrome, nonepileptic seizures, followed by neurologist through [facility]. Although she has documented history of multiple sclerosis, her neurologist back in 2015 did not think her MRI imaging was negative for this and her lumbar puncture have been negative in the past for oligoclonal bands.  States around 6 PM she felt very unwell with right-sided stabbing, throbbing chest pain that radiated towards the back down her right arm and up into the neck. This is similar to when she came to the ED one month ago and was admitted for chest pain rule out. Had negative stress test at that time with unremarkable echocardiogram. Plan to follow-up with cardiology in 1 month as an outpatient. Her pain currently is associated with mild shortness of breath and nausea. Pain is worse with movement, is not pleuritic. No alleviating factors. No associated lower extremity edema or pain. No syncope or near syncope. States also right-sided headache with generalized weakness all over. History of migraine headaches.   Past Medical History:  Diagnosis Date  . Anemia   . Anxiety   . Arthritis    "joints" (04/17/2013)  . Chronic back pain    "all over" (04/17/2013)  . Crohn disease ([place])   . Daily headache   . Depression   . Diabetes type 2, uncontrolled ([place])   . Epileptic ([place])    "had 1-2/night; started RX; last one was 01/2014"  . GERD (gastroesophageal reflux disease)   . Migraine    "q day" (04/17/2013)  . MS (multiple sclerosis) ([place])   . Pneumonia    "about 10 times in my lifetime" (04/17/2013)     Patient Active Problem List   Diagnosis Date Noted  . Hypochloremia   . Hyponatremia   . Diabetes mellitus due to underlying condition without complications ([place]) 27/05/5007  . Anemia, unspecified 10/05/2013  . Hypokalemia 10/05/2013  . Smoking 10/05/2013  . Menorrhagia with regular cycle 10/05/2013  . Esophageal reflux 10/05/2013  . Chest pain 04/17/2013  . Seizure ([place]) 02/24/2013  . Atypical chest pain 09/22/2012  . Noncompliance 09/22/2012  . Tobacco abuse 09/22/2012  . DM2 (diabetes mellitus, type 2) ([place]) 09/22/2012  . Protein-calorie malnutrition, severe ([place]) 09/22/2012    Past Surgical History:  Procedure Laterality Date  . CARDIAC CATHETERIZATION N/A 10/08/2015   Procedure: Left Heart Cath and Coronary Angiography;  Surgeon: Peter M Martinique, MD;  Location: [facility];  Service: Cardiovascular;  Laterality: N/A;  . CHOLECYSTECTOMY  1994  . TUBAL LIGATION  1993    OB History    No data available       Home Medications    Prior to Admission medications   Medication Sig Start Date End Date Taking? Authorizing Provider  ASA-APAP-Salicyl-Caff (PAINAID PO) Apply 1-2 sprays topically 4 (four) times daily as needed for pain. 10/29/15  Yes Historical Provider, MD  aspirin EC 81 MG tablet Take 1 tablet (81 mg total) by mouth daily. 05/02/13  Yes Reyne Dumas, MD  baclofen (LIORESAL) 20 MG tablet Take 20 mg by mouth 4 (four) times daily.    Yes  Historical Provider, MD  Canagliflozin-Metformin HCl (INVOKAMET) [phone] MG TABS Take 1 tablet by mouth 2 (two) times daily.   Yes Historical Provider, MD  DULoxetine (CYMBALTA) 60 MG capsule Take 60 mg by mouth daily.   Yes Historical Provider, MD  Eluxadoline (VIBERZI) 75 MG TABS Take 75 mg by mouth 2 (two) times daily.   Yes Historical Provider, MD  Eslicarbazepine Acetate (APTIOM) 800 MG TABS Take 1,600 mg by mouth at bedtime.   Yes Historical Provider, MD  Gabapentin Enacarbil (HORIZANT) 600 MG TBCR Take 600 mg by mouth  2 (two) times daily.    Yes Historical Provider, MD  insulin aspart (NOVOLOG FLEXPEN) 100 UNIT/ML FlexPen Inject 14-28 Units into the skin 3 (three) times daily with meals. SLIDING SCALE   Yes Historical Provider, MD  insulin detemir (LEVEMIR) 100 unit/ml SOLN Inject 40 Units into the skin at bedtime.   Yes Historical Provider, MD  lidocaine-prilocaine (EMLA) cream Apply 1 application topically daily as needed (pain).   Yes Historical Provider, MD  lisinopril (PRINIVIL,ZESTRIL) 5 MG tablet Take 1 tablet (5 mg total) by mouth daily. 10/08/15  Yes Ripudeep Krystal Eaton, MD  nortriptyline (PAMELOR) 25 MG capsule Take 50 mg by mouth at bedtime.    Yes Historical Provider, MD  pantoprazole (PROTONIX) 40 MG tablet Take 1 tablet (40 mg total) by mouth daily. 11/16/13  Yes Lorayne Marek, MD  promethazine (PHENERGAN) 12.5 MG tablet Take 12.5 mg by mouth 2 (two) times daily.   Yes Historical Provider, MD  tapentadol (NUCYNTA ER) 100 MG 12 hr tablet Take 100 mg by mouth every 12 (twelve) hours.   Yes Historical Provider, MD  tapentadol (NUCYNTA) 50 MG tablet Take 50 mg by mouth every 6 (six) hours as needed for severe pain.    Yes Historical Provider, MD  cyclobenzaprine (FLEXERIL) 10 MG tablet Take 1 tablet (10 mg total) by mouth 2 (two) times daily as needed for muscle spasms. 11/19/15   Forde Dandy, MD  predniSONE (STERAPRED UNI-PAK 21 TAB) 10 MG (21) TBPK tablet Take 1 tablet (10 mg total) by mouth daily. Take 6 tabs by mouth daily  for 2 days, then 5 tabs for 2 days, then 4 tabs for 2 days, then 3 tabs for 2 days, 2 tabs for 2 days, then 1 tab by mouth daily for 2 days 11/19/15   Forde Dandy, MD    Family History Family History  Problem Relation Age of Onset  . Diabetes Mother   . Heart disease Mother   . Stroke Mother   . Heart failure Mother   . Heart disease Father   . Stroke Father   . Heart failure Father   . Heart attack Brother   . Healthy Brother     Social History Social History  Substance Use  Topics  . Smoking status: Current Every Day Smoker    Packs/day: 0.50    Years: 21.00    Types: Cigarettes  . Smokeless tobacco: Never Used  . Alcohol use No     Allergies   Codeine; Peanut-containing drug products; Aspirin; and Penicillins   Review of Systems Review of Systems 10/14 systems reviewed and are negative other than those stated in the HPI   Physical Exam Updated Vital Signs BP 145/79   Pulse 94   Temp 98.3 F (36.8 C) (Oral)   Resp 18   Ht 5' 9"  (1.753 m)   Wt 240 lb (108.9 kg)   LMP 11/01/2015   SpO2 100%  BMI 35.44 kg/m   Physical Exam Physical Exam  Nursing note and vitals reviewed. Constitutional: Well developed, well nourished, non-toxic, and in no acute distress Head: Normocephalic and atraumatic.  Mouth/Throat: Oropharynx is clear and moist.  Eyes: PERRL. EOMI. Neck: Normal range of motion. Neck supple.  Cardiovascular: Normal rate and regular rhythm.   Pulmonary/Chest: Effort normal and breath sounds normal.  Abdominal: Soft. There is no tenderness. There is no rebound and no guarding.  Musculoskeletal: Normal range of motion.  Neurological: Alert, no facial droop, fluent speech, moves all extremities symmetrically, sensation to light touch in tact throughout Skin: Skin is warm and dry.  Psychiatric: Cooperative   ED Treatments / Results  Labs (all labs ordered are listed, but only abnormal results are displayed) Labs Reviewed  BASIC METABOLIC PANEL - Abnormal; Notable for the following:       Result Value   Sodium 131 (*)    Potassium 3.3 (*)    Chloride 96 (*)    BUN <5 (*)    Calcium 8.8 (*)    All other components within normal limits  CBC - Abnormal; Notable for the following:    WBC 14.2 (*)    Hemoglobin 9.9 (*)    HCT 33.6 (*)    MCV 66.5 (*)    MCH 19.6 (*)    MCHC 29.5 (*)    RDW 19.9 (*)    Platelets 584 (*)    All other components within normal limits  URINALYSIS, ROUTINE W REFLEX MICROSCOPIC (NOT AT [facility]) -  Abnormal; Notable for the following:    Color, Urine STRAW (*)    Glucose, UA >1000 (*)    All other components within normal limits  DIFFERENTIAL - Abnormal; Notable for the following:    Neutro Abs 10.4 (*)    Monocytes Absolute 1.4 (*)    All other components within normal limits  URINE MICROSCOPIC-ADD ON - Abnormal; Notable for the following:    Squamous Epithelial / LPF 0-5 (*)    Bacteria, UA FEW (*)    All other components within normal limits  DIFFERENTIAL  I-STAT TROPOININ, ED  I-STAT BETA HCG BLOOD, ED ([facility], [facility], [facility] ONLY)  I-STAT TROPOININ, ED    EKG  EKG Interpretation  Date/Time:  Tuesday November 19 2015 22:30:26 EDT Ventricular Rate:  91 PR Interval:    QRS Duration: 103 QT Interval:  381 QTC Calculation: 469 R Axis:   -50 Text Interpretation:  Sinus rhythm Borderline prolonged PR interval LAD, consider left anterior fascicular block RSR' in V1 or V2, probably normal variant Borderline T abnormalities, anterior leads similar to prior EKG  Confirmed by Muhamad Serano MD, Issaac Shipper [phone]) on 11/19/2015 11:00:43 PM       Radiology Dg Chest 2 View  Result Date: 11/19/2015 CLINICAL DATA:  Throbbing chest pain EXAM: CHEST  2 VIEW COMPARISON:  05/31/2015 FINDINGS: The heart size and mediastinal contours are within normal limits. Both lungs are clear. The visualized skeletal structures are unremarkable. IMPRESSION: No active cardiopulmonary disease. Electronically Signed   By: Jerilynn Mages.  Shick M.D.   On: 11/19/2015 20:46   Ct Head Wo Contrast  Result Date: 11/19/2015 CLINICAL DATA:  Generalized weakness, chest pain, diabetes, acute migraine headache EXAM: CT HEAD WITHOUT CONTRAST TECHNIQUE: Contiguous axial images were obtained from the base of the skull through the vertex without intravenous contrast. COMPARISON:  02/24/2013 FINDINGS: Brain: No evidence of acute infarction, hemorrhage, hydrocephalus, extra-axial collection or mass lesion/mass effect. Vascular: No hyperdense vessel or  unexpected  calcification. Skull: Normal. Negative for fracture or focal lesion. Sinuses/Orbits: No acute finding. Other: None. IMPRESSION: Normal head CT without contrast.  Stable exam. Electronically Signed   By: Jerilynn Mages.  Shick M.D.   On: 11/19/2015 20:33    Procedures Procedures (including critical care time)  Medications Ordered in ED Medications  sodium chloride 0.9 % bolus 1,000 mL (0 mLs Intravenous Stopped 11/19/15 2303)  HYDROmorphone (DILAUDID) injection 1 mg (1 mg Intravenous Given 11/19/15 1935)  metoCLOPramide (REGLAN) injection 10 mg (10 mg Intravenous Given 11/19/15 1935)  diazepam (VALIUM) injection 10 mg (10 mg Intravenous Given 11/19/15 2151)     Initial Impression / Assessment and Plan / ED Course  I have reviewed the triage vital signs and the nursing notes.  Pertinent labs & imaging results that were available during my care of the patient were reviewed by me and considered in my medical decision making (see chart for details).  Clinical Course    45 year old female with history of type 2 diabetes, chronic pain syndrome, nonepileptic seizures, and migraine headaches who presents with chest pain. Old records reviewed and she had recent admission for chest pain less than one month ago with unremarkable stress test and echocardiogram. Her chest pain seems atypical today. Serial troponins are negative and EKG without dynamic changes no acute ischemic changes. Seems unlikely to be ACS given her recent workup. She also had CT angiogram of her chest to rule out PE within the last month as well that has been negative. Pain seems atypical for that a PE or dissection and I do not think she requires acute imaging for that currently. 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 She is given pain control and muscle relaxant with good  control of symptoms. Remainder of her workup without signs of infection in urine or chest. She feels improved and I feels she is stable for discharge home. She will follow-up with PCP and neurologist as needed. Given muscle relaxant and steroids for neck pain. Strict return and follow-up instructions reviewed. She expressed understanding of all discharge instructions and felt comfortable with the plan of care.   Final Clinical Impressions(s) / ED Diagnoses   Final diagnoses:  Atypical chest pain  Neck pain  Generalized weakness    New Prescriptions New Prescriptions   CYCLOBENZAPRINE (FLEXERIL) 10 MG TABLET    Take 1 tablet (10 mg total) by mouth 2 (two) times daily as needed for muscle spasms.   PREDNISONE (STERAPRED UNI-PAK 21 TAB) 10 MG (21) TBPK TABLET    Take 1 tablet (10 mg total) by mouth daily. Take 6 tabs by mouth daily  for 2 days, then 5 tabs for 2 days, then 4 tabs for 2 days, then 3 tabs for 2 days, 2 tabs for 2 days, then 1 tab by mouth daily for 2 days     Forde Dandy, MD 11/20/15 0004

## 2015-11-25 DIAGNOSIS — E1165 Type 2 diabetes mellitus with hyperglycemia: Secondary | ICD-10-CM | POA: Diagnosis not present

## 2015-11-27 DIAGNOSIS — E1129 Type 2 diabetes mellitus with other diabetic kidney complication: Secondary | ICD-10-CM | POA: Diagnosis not present

## 2015-11-27 DIAGNOSIS — Z6838 Body mass index (BMI) 38.0-38.9, adult: Secondary | ICD-10-CM | POA: Diagnosis not present

## 2015-11-27 DIAGNOSIS — E669 Obesity, unspecified: Secondary | ICD-10-CM | POA: Diagnosis not present

## 2015-11-27 DIAGNOSIS — Z794 Long term (current) use of insulin: Secondary | ICD-10-CM | POA: Diagnosis not present

## 2015-11-27 DIAGNOSIS — E1165 Type 2 diabetes mellitus with hyperglycemia: Secondary | ICD-10-CM | POA: Diagnosis not present

## 2015-11-27 DIAGNOSIS — R809 Proteinuria, unspecified: Secondary | ICD-10-CM | POA: Diagnosis not present

## 2015-12-01 DIAGNOSIS — M19031 Primary osteoarthritis, right wrist: Secondary | ICD-10-CM | POA: Diagnosis not present

## 2015-12-01 DIAGNOSIS — R072 Precordial pain: Secondary | ICD-10-CM | POA: Diagnosis not present

## 2015-12-01 DIAGNOSIS — R600 Localized edema: Secondary | ICD-10-CM | POA: Diagnosis not present

## 2015-12-01 DIAGNOSIS — R0602 Shortness of breath: Secondary | ICD-10-CM | POA: Diagnosis not present

## 2015-12-01 DIAGNOSIS — R69 Illness, unspecified: Secondary | ICD-10-CM | POA: Diagnosis not present

## 2015-12-01 DIAGNOSIS — R079 Chest pain, unspecified: Secondary | ICD-10-CM | POA: Diagnosis not present

## 2015-12-01 DIAGNOSIS — M7989 Other specified soft tissue disorders: Secondary | ICD-10-CM | POA: Diagnosis not present

## 2015-12-01 DIAGNOSIS — K760 Fatty (change of) liver, not elsewhere classified: Secondary | ICD-10-CM | POA: Diagnosis not present

## 2015-12-17 DIAGNOSIS — R0789 Other chest pain: Secondary | ICD-10-CM | POA: Diagnosis not present

## 2015-12-18 DIAGNOSIS — G894 Chronic pain syndrome: Secondary | ICD-10-CM | POA: Diagnosis not present

## 2015-12-18 DIAGNOSIS — Z79891 Long term (current) use of opiate analgesic: Secondary | ICD-10-CM | POA: Diagnosis not present

## 2015-12-18 DIAGNOSIS — M5136 Other intervertebral disc degeneration, lumbar region: Secondary | ICD-10-CM | POA: Diagnosis not present

## 2015-12-18 DIAGNOSIS — R69 Illness, unspecified: Secondary | ICD-10-CM | POA: Diagnosis not present

## 2015-12-18 DIAGNOSIS — Z720 Tobacco use: Secondary | ICD-10-CM | POA: Diagnosis not present

## 2015-12-18 DIAGNOSIS — M5416 Radiculopathy, lumbar region: Secondary | ICD-10-CM | POA: Diagnosis not present

## 2015-12-18 DIAGNOSIS — E119 Type 2 diabetes mellitus without complications: Secondary | ICD-10-CM | POA: Diagnosis not present

## 2015-12-18 DIAGNOSIS — G629 Polyneuropathy, unspecified: Secondary | ICD-10-CM | POA: Diagnosis not present

## 2015-12-18 DIAGNOSIS — M5134 Other intervertebral disc degeneration, thoracic region: Secondary | ICD-10-CM | POA: Diagnosis not present

## 2015-12-18 DIAGNOSIS — M5030 Other cervical disc degeneration, unspecified cervical region: Secondary | ICD-10-CM | POA: Diagnosis not present

## 2015-12-26 DIAGNOSIS — R69 Illness, unspecified: Secondary | ICD-10-CM | POA: Diagnosis not present

## 2016-01-15 DIAGNOSIS — M5136 Other intervertebral disc degeneration, lumbar region: Secondary | ICD-10-CM | POA: Diagnosis not present

## 2016-01-15 DIAGNOSIS — M5416 Radiculopathy, lumbar region: Secondary | ICD-10-CM | POA: Diagnosis not present

## 2016-01-15 DIAGNOSIS — M5134 Other intervertebral disc degeneration, thoracic region: Secondary | ICD-10-CM | POA: Diagnosis not present

## 2016-01-15 DIAGNOSIS — E119 Type 2 diabetes mellitus without complications: Secondary | ICD-10-CM | POA: Diagnosis not present

## 2016-01-15 DIAGNOSIS — M5030 Other cervical disc degeneration, unspecified cervical region: Secondary | ICD-10-CM | POA: Diagnosis not present

## 2016-01-15 DIAGNOSIS — G629 Polyneuropathy, unspecified: Secondary | ICD-10-CM | POA: Diagnosis not present

## 2016-01-15 DIAGNOSIS — Z79891 Long term (current) use of opiate analgesic: Secondary | ICD-10-CM | POA: Diagnosis not present

## 2016-01-15 DIAGNOSIS — R69 Illness, unspecified: Secondary | ICD-10-CM | POA: Diagnosis not present

## 2016-01-15 DIAGNOSIS — Z720 Tobacco use: Secondary | ICD-10-CM | POA: Diagnosis not present

## 2016-01-15 DIAGNOSIS — G894 Chronic pain syndrome: Secondary | ICD-10-CM | POA: Diagnosis not present

## 2016-01-28 DIAGNOSIS — E1165 Type 2 diabetes mellitus with hyperglycemia: Secondary | ICD-10-CM | POA: Diagnosis not present

## 2016-02-17 DIAGNOSIS — Z79891 Long term (current) use of opiate analgesic: Secondary | ICD-10-CM | POA: Diagnosis not present

## 2016-02-17 DIAGNOSIS — M5416 Radiculopathy, lumbar region: Secondary | ICD-10-CM | POA: Diagnosis not present

## 2016-02-17 DIAGNOSIS — M5134 Other intervertebral disc degeneration, thoracic region: Secondary | ICD-10-CM | POA: Diagnosis not present

## 2016-02-17 DIAGNOSIS — R69 Illness, unspecified: Secondary | ICD-10-CM | POA: Diagnosis not present

## 2016-02-17 DIAGNOSIS — G629 Polyneuropathy, unspecified: Secondary | ICD-10-CM | POA: Diagnosis not present

## 2016-02-17 DIAGNOSIS — G894 Chronic pain syndrome: Secondary | ICD-10-CM | POA: Diagnosis not present

## 2016-02-17 DIAGNOSIS — M5030 Other cervical disc degeneration, unspecified cervical region: Secondary | ICD-10-CM | POA: Diagnosis not present

## 2016-02-17 DIAGNOSIS — E119 Type 2 diabetes mellitus without complications: Secondary | ICD-10-CM | POA: Diagnosis not present

## 2016-02-17 DIAGNOSIS — M5136 Other intervertebral disc degeneration, lumbar region: Secondary | ICD-10-CM | POA: Diagnosis not present

## 2016-02-17 DIAGNOSIS — Z720 Tobacco use: Secondary | ICD-10-CM | POA: Diagnosis not present

## 2016-02-26 DIAGNOSIS — R69 Illness, unspecified: Secondary | ICD-10-CM | POA: Diagnosis not present

## 2016-02-29 DIAGNOSIS — N12 Tubulo-interstitial nephritis, not specified as acute or chronic: Secondary | ICD-10-CM | POA: Diagnosis not present

## 2016-02-29 DIAGNOSIS — R1031 Right lower quadrant pain: Secondary | ICD-10-CM | POA: Diagnosis not present

## 2016-03-05 DIAGNOSIS — M19032 Primary osteoarthritis, left wrist: Secondary | ICD-10-CM | POA: Diagnosis not present

## 2016-03-05 DIAGNOSIS — M19031 Primary osteoarthritis, right wrist: Secondary | ICD-10-CM | POA: Diagnosis not present

## 2016-03-17 DIAGNOSIS — M5136 Other intervertebral disc degeneration, lumbar region: Secondary | ICD-10-CM | POA: Diagnosis not present

## 2016-03-17 DIAGNOSIS — M5030 Other cervical disc degeneration, unspecified cervical region: Secondary | ICD-10-CM | POA: Diagnosis not present

## 2016-03-17 DIAGNOSIS — R69 Illness, unspecified: Secondary | ICD-10-CM | POA: Diagnosis not present

## 2016-03-17 DIAGNOSIS — M5416 Radiculopathy, lumbar region: Secondary | ICD-10-CM | POA: Diagnosis not present

## 2016-03-17 DIAGNOSIS — Z79891 Long term (current) use of opiate analgesic: Secondary | ICD-10-CM | POA: Diagnosis not present

## 2016-03-17 DIAGNOSIS — Z720 Tobacco use: Secondary | ICD-10-CM | POA: Diagnosis not present

## 2016-03-17 DIAGNOSIS — E119 Type 2 diabetes mellitus without complications: Secondary | ICD-10-CM | POA: Diagnosis not present

## 2016-03-17 DIAGNOSIS — G629 Polyneuropathy, unspecified: Secondary | ICD-10-CM | POA: Diagnosis not present

## 2016-03-17 DIAGNOSIS — M5134 Other intervertebral disc degeneration, thoracic region: Secondary | ICD-10-CM | POA: Diagnosis not present

## 2016-03-17 DIAGNOSIS — G894 Chronic pain syndrome: Secondary | ICD-10-CM | POA: Diagnosis not present

## 2016-03-28 ENCOUNTER — Encounter (HOSPITAL_COMMUNITY): Payer: Self-pay | Admitting: Emergency Medicine

## 2016-03-28 ENCOUNTER — Inpatient Hospital Stay (HOSPITAL_COMMUNITY)
Admission: EM | Admit: 2016-03-28 | Discharge: 2016-03-31 | DRG: 690 | Disposition: A | Discharge status: Home / Self Care | Payer: Medicare HMO | Attending: Family Medicine | Admitting: Family Medicine

## 2016-03-28 DIAGNOSIS — N12 Tubulo-interstitial nephritis, not specified as acute or chronic: Secondary | ICD-10-CM | POA: Diagnosis not present

## 2016-03-28 DIAGNOSIS — Z9049 Acquired absence of other specified parts of digestive tract: Secondary | ICD-10-CM

## 2016-03-28 DIAGNOSIS — R339 Retention of urine, unspecified: Secondary | ICD-10-CM | POA: Diagnosis not present

## 2016-03-28 DIAGNOSIS — Z794 Long term (current) use of insulin: Secondary | ICD-10-CM | POA: Diagnosis not present

## 2016-03-28 DIAGNOSIS — K5090 Crohn's disease, unspecified, without complications: Secondary | ICD-10-CM | POA: Diagnosis present

## 2016-03-28 DIAGNOSIS — R109 Unspecified abdominal pain: Secondary | ICD-10-CM | POA: Diagnosis not present

## 2016-03-28 DIAGNOSIS — N10 Acute tubulo-interstitial nephritis: Secondary | ICD-10-CM | POA: Diagnosis not present

## 2016-03-28 DIAGNOSIS — E871 Hypo-osmolality and hyponatremia: Secondary | ICD-10-CM | POA: Diagnosis present

## 2016-03-28 DIAGNOSIS — B9561 Methicillin susceptible Staphylococcus aureus infection as the cause of diseases classified elsewhere: Secondary | ICD-10-CM | POA: Diagnosis present

## 2016-03-28 DIAGNOSIS — Z833 Family history of diabetes mellitus: Secondary | ICD-10-CM

## 2016-03-28 DIAGNOSIS — K219 Gastro-esophageal reflux disease without esophagitis: Secondary | ICD-10-CM | POA: Diagnosis present

## 2016-03-28 DIAGNOSIS — G8929 Other chronic pain: Secondary | ICD-10-CM | POA: Diagnosis present

## 2016-03-28 DIAGNOSIS — E119 Type 2 diabetes mellitus without complications: Secondary | ICD-10-CM

## 2016-03-28 DIAGNOSIS — Z8249 Family history of ischemic heart disease and other diseases of the circulatory system: Secondary | ICD-10-CM

## 2016-03-28 DIAGNOSIS — E118 Type 2 diabetes mellitus with unspecified complications: Secondary | ICD-10-CM | POA: Diagnosis not present

## 2016-03-28 DIAGNOSIS — G35 Multiple sclerosis: Secondary | ICD-10-CM | POA: Diagnosis present

## 2016-03-28 DIAGNOSIS — G40909 Epilepsy, unspecified, not intractable, without status epilepticus: Secondary | ICD-10-CM | POA: Diagnosis present

## 2016-03-28 DIAGNOSIS — G894 Chronic pain syndrome: Secondary | ICD-10-CM | POA: Diagnosis not present

## 2016-03-28 DIAGNOSIS — M549 Dorsalgia, unspecified: Secondary | ICD-10-CM | POA: Diagnosis present

## 2016-03-28 DIAGNOSIS — Z823 Family history of stroke: Secondary | ICD-10-CM

## 2016-03-28 LAB — URINALYSIS, ROUTINE W REFLEX MICROSCOPIC
BILIRUBIN URINE: NEGATIVE
Bacteria, UA: NONE SEEN
Glucose, UA: >=500 mg/dL — AB
HGB URINE DIPSTICK: NEGATIVE
Ketones, ur: 20 mg/dL — AB
NITRITE: NEGATIVE
PROTEIN: 100 mg/dL — AB
Specific Gravity, Urine: 1.02 (ref 1.005–1.030)
pH: 7 (ref 5.0–8.0)

## 2016-03-28 LAB — BASIC METABOLIC PANEL
Anion gap: 13 (ref 5–15)
BUN: 5 mg/dL — AB (ref 6–20)
CO2: 22 mmol/L (ref 22–32)
Calcium: 9.1 mg/dL (ref 8.9–10.3)
Chloride: 97 mmol/L — ABNORMAL LOW (ref 101–111)
Creatinine, Ser: 0.67 mg/dL (ref 0.44–1.00)
GFR calc Af Amer: >60 mL/min (ref >60)
Glucose, Bld: 180 mg/dL — ABNORMAL HIGH (ref 65–99)
Potassium: 3.9 mmol/L (ref 3.5–5.1)
SODIUM: 132 mmol/L — AB (ref 135–145)

## 2016-03-28 LAB — CBC
HCT: 34.7 % — ABNORMAL LOW (ref 36.0–46.0)
Hemoglobin: 10.6 g/dL — ABNORMAL LOW (ref 12.0–15.0)
MCH: 19.7 pg — ABNORMAL LOW (ref 26.0–34.0)
MCHC: 30.5 g/dL (ref 30.0–36.0)
MCV: 64.4 fL — ABNORMAL LOW (ref 78.0–100.0)
PLATELETS: 441 10*3/uL — AB (ref 150–400)
RBC: 5.39 MIL/uL — ABNORMAL HIGH (ref 3.87–5.11)
RDW: 19.9 % — AB (ref 11.5–15.5)
WBC: 16 10*3/uL — AB (ref 4.0–10.5)

## 2016-03-28 LAB — LACTIC ACID, PLASMA: LACTIC ACID, VENOUS: 0.9 mmol/L (ref 0.5–1.9)

## 2016-03-28 LAB — POC URINE PREG, ED: Preg Test, Ur: NEGATIVE

## 2016-03-28 MED ORDER — CIPROFLOXACIN IN D5W 400 MG/200ML IV SOLN
400.0000 mg | Freq: Once | INTRAVENOUS | Status: AC
  Administered 2016-03-28: 400 mg via INTRAVENOUS
  Filled 2016-03-28: qty 200

## 2016-03-28 MED ORDER — SODIUM CHLORIDE 0.9 % IV SOLN
INTRAVENOUS | Status: DC
  Administered 2016-03-28 – 2016-03-30 (×5): via INTRAVENOUS

## 2016-03-28 MED ORDER — HYDROMORPHONE HCL 2 MG/ML IJ SOLN
0.5000 mg | Freq: Once | INTRAMUSCULAR | Status: AC
Start: 2016-03-28 — End: 2016-03-28
  Administered 2016-03-28: 0.5 mg via INTRAVENOUS
  Filled 2016-03-28: qty 1

## 2016-03-28 MED ORDER — ONDANSETRON HCL 4 MG/2ML IJ SOLN
4.0000 mg | Freq: Once | INTRAMUSCULAR | Status: AC
  Administered 2016-03-28: 4 mg via INTRAVENOUS
  Filled 2016-03-28: qty 2

## 2016-03-28 NOTE — ED Notes (Signed)
Bladder scan 362 mL.

## 2016-03-28 NOTE — ED Provider Notes (Signed)
[facility] DEPT Provider Note   CSN: [id number] Arrival date & time: 03/28/16  2000    By signing my name below, I, Macon Large, attest that this documentation has been prepared under the direction and in the presence of Debroah Baller, NP. Electronically Signed: Macon Large, ED Scribe. 03/28/16. 10:35 PM.  History   Chief Complaint Chief Complaint  Patient presents with  . Urinary Retention  . Flank Pain   The history is provided by the patient. No language interpreter was used.   HPI Comments: Andrea Wade is a 47 y.o. female with PMHx of Type II DM, multiple sclerosis, epileptic who presents to the Emergency Department presenting with acute urinary retention onset a couple of days ago. Pt reports associated back and suprapubic pain with dysuria. She also notes having nausea and a subjective fever accompanied with chills and sweats. Pt's temperature in the ED today was 99.5. Pt states last time she used the restroom she had some mild dysuria. She denies hx of UTIs. Pt reports taking cranberry azo pills at home with no significant relief. Denies vomiting.   Past Medical History:  Diagnosis Date  . Anemia   . Anxiety   . Arthritis    "joints" (04/17/2013)  . Chronic back pain    "all over" (04/17/2013)  . Crohn disease ([place])   . Daily headache   . Depression   . Diabetes type 2, uncontrolled ([place])   . 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 GERD (gastroesophageal reflux disease)   . Migraine    "q day" (04/17/2013)  . MS (multiple sclerosis) ([place])   . Pneumonia    "about 10 times in my lifetime" (04/17/2013)    Patient Active Problem List   Diagnosis Date Noted  . Pyelonephritis 03/29/2016  . Hypochloremia   . Hyponatremia   . Diabetes mellitus due to underlying condition without complications ([place]) 16/08/3708  . Anemia, unspecified 10/05/2013  . Hypokalemia 10/05/2013  . Smoking 10/05/2013  . Menorrhagia with regular cycle  10/05/2013  . Esophageal reflux 10/05/2013  . Chest pain 04/17/2013  . Seizure ([place]) 02/24/2013  . Atypical chest pain 09/22/2012  . Noncompliance 09/22/2012  . Tobacco abuse 09/22/2012  . DM2 (diabetes mellitus, type 2) ([place]) 09/22/2012  . Protein-calorie malnutrition, severe ([place]) 09/22/2012    Past Surgical History:  Procedure Laterality Date  . CARDIAC CATHETERIZATION N/A 10/08/2015   Procedure: Left Heart Cath and Coronary Angiography;  Surgeon: Peter M Martinique, MD;  Location: [facility];  Service: Cardiovascular;  Laterality: N/A;  . CHOLECYSTECTOMY  1994  . TUBAL LIGATION  1993    OB History    No data available       Home Medications    Prior to Admission medications   Medication Sig Start Date End Date Taking? Authorizing Provider  baclofen (LIORESAL) 20 MG tablet Take 20 mg by mouth 4 (four) times daily.    Yes Historical Provider, MD  Canagliflozin-Metformin HCl (INVOKAMET) [phone] MG TABS Take 1 tablet by mouth 2 (two) times daily.   Yes Historical Provider, MD  DULoxetine (CYMBALTA) 60 MG capsule Take 60 mg by mouth daily.   Yes Historical Provider, MD  Eluxadoline (VIBERZI) 75 MG TABS Take 75 mg by mouth 2 (two) times daily.   Yes Historical Provider, MD  Eslicarbazepine Acetate (APTIOM) 800 MG TABS Take 1,600 mg by mouth at bedtime.   Yes Historical Provider, MD  Gabapentin Enacarbil (HORIZANT) 600 MG TBCR  Take 600 mg by mouth 2 (two) times daily.    Yes Historical Provider, MD  insulin aspart (NOVOLOG FLEXPEN) 100 UNIT/ML FlexPen Inject 14-28 Units into the skin 3 (three) times daily with meals. SLIDING SCALE   Yes Historical Provider, MD  insulin detemir (LEVEMIR) 100 unit/ml SOLN Inject 40 Units into the skin at bedtime.   Yes Historical Provider, MD  lisinopril (PRINIVIL,ZESTRIL) 5 MG tablet Take 1 tablet (5 mg total) by mouth daily. 10/08/15  Yes Ripudeep Krystal Eaton, MD  nortriptyline (PAMELOR) 25 MG capsule Take 50 mg by mouth at bedtime.    Yes Historical  Provider, MD  pantoprazole (PROTONIX) 40 MG tablet Take 1 tablet (40 mg total) by mouth daily. 11/16/13  Yes Lorayne Marek, MD  promethazine (PHENERGAN) 12.5 MG tablet Take 12.5 mg by mouth 2 (two) times daily.   Yes Historical Provider, MD  tapentadol (NUCYNTA) 50 MG tablet Take 50 mg by mouth every 6 (six) hours.   Yes Historical Provider, MD  Tapentadol HCl 150 MG TB12 Take 150 mg by mouth 2 (two) times daily.   Yes Historical Provider, MD    Family History Family History  Problem Relation Age of Onset  . Diabetes Mother   . Heart disease Mother   . Stroke Mother   . Heart failure Mother   . Heart disease Father   . Stroke Father   . Heart failure Father   . Heart attack Brother   . Healthy Brother     Social History Social History  Substance Use Topics  . Smoking status: Current Every Day Smoker    Packs/day: 0.50    Years: 21.00    Types: Cigarettes  . Smokeless tobacco: Never Used  . Alcohol use No     Allergies   Codeine; Peanut-containing drug products; Aspirin; and Penicillins   Review of Systems Review of Systems  Constitutional: Positive for chills and fever.  HENT: Negative.   Eyes: Negative for visual disturbance.  Respiratory: Negative for cough.   Cardiovascular: Negative for chest pain.  Gastrointestinal: Positive for abdominal pain (suprapubic) and nausea. Negative for vomiting.  Genitourinary: Positive for decreased urine volume, difficulty urinating and dysuria.  Musculoskeletal: Positive for back pain.  Skin: Negative for rash.  Neurological: Negative for syncope.  Psychiatric/Behavioral: Negative for confusion.     Physical Exam Updated Vital Signs BP 119/64 (BP Location: Left Arm)   Pulse 92   Temp 99.9 F (37.7 C) (Rectal)   Resp 19   Ht 5' 9"  (1.753 m)   Wt 108.9 kg   LMP 03/02/2016   SpO2 98%   BMI 35.44 kg/m   Physical Exam  Constitutional: She appears well-developed and well-nourished. No distress.  HENT:  Head:  Normocephalic and atraumatic.  Eyes: EOM are normal.  Neck: Neck supple.  Cardiovascular: Regular rhythm.  Tachycardia present.   Pulmonary/Chest: Effort normal. No respiratory distress. She has no wheezes. She has no rales.  Abdominal: Soft. There is tenderness in the suprapubic area. There is CVA tenderness. There is no rebound and no guarding.  Genitourinary: There is no rash or injury on the right labia. There is no rash or injury on the left labia.  Genitourinary Comments: External genitalia without lesions, catheter inserted into urethra without difficulty.   Musculoskeletal: She exhibits no edema.  Neurological: She is alert.  Skin: Skin is warm and dry. No rash noted. She is not diaphoretic.  Psychiatric: She has a normal mood and affect.  Nursing note and vitals  reviewed.    ED Treatments / Results   DIAGNOSTIC STUDIES: Oxygen Saturation is 90% on RA, low by my interpretation.    COORDINATION OF CARE: 10:23 PM Discussed treatment plan with pt at bedside which includes labs, bladder catheterization, nausea and pain medication and pt agreed to plan. Labs including blood cultures, IV antibiotics, medication for n/v, pain management.  Labs (all labs ordered are listed, but only abnormal results are displayed) Labs Reviewed  URINALYSIS, ROUTINE W REFLEX MICROSCOPIC - Abnormal; Notable for the following:       Result Value   APPearance HAZY (*)    Glucose, UA >=500 (*)    Ketones, ur 20 (*)    Protein, ur 100 (*)    Leukocytes, UA MODERATE (*)    Squamous Epithelial / LPF 0-5 (*)    All other components within normal limits  BASIC METABOLIC PANEL - Abnormal; Notable for the following:    Sodium 132 (*)    Chloride 97 (*)    Glucose, Bld 180 (*)    BUN 5 (*)    All other components within normal limits  CBC - Abnormal; Notable for the following:    WBC 16.0 (*)    RBC 5.39 (*)    Hemoglobin 10.6 (*)    HCT 34.7 (*)    MCV 64.4 (*)    MCH 19.7 (*)    RDW 19.9 (*)      Platelets 441 (*)    All other components within normal limits  URINE CULTURE  CULTURE, BLOOD (ROUTINE X 2)  CULTURE, BLOOD (ROUTINE X 2)  LACTIC ACID, PLASMA  LACTIC ACID, PLASMA  POC URINE PREG, ED     Radiology No results found.  Procedures BLADDER CATHETERIZATION Date/Time: 03/28/2016 10:23 PM Performed by: Ashley Murrain Authorized by: Tanna Furry   Consent:    Consent obtained:  Verbal   Consent given by:  Patient   Risks discussed:  Pain and incomplete procedure Pre-procedure details:    Procedure purpose:  Therapeutic   Preparation: Patient was prepped and draped in usual sterile fashion   Anesthesia (see MAR for exact dosages):    Anesthesia method:  None Procedure details:    Provider performed due to:  Nurse unavailable   Catheter insertion:  Non-indwelling   Catheter type:  Red rubber   Catheter size:  16 Fr   Bladder irrigation: no     Number of attempts:  1   Urine characteristics:  Dark and cloudy Post-procedure details:    Patient tolerance of procedure:  Tolerated well, no immediate complications Comments:     500cc of urine output   (including critical care time)  Medications Ordered in ED Medications  0.9 %  sodium chloride infusion ( Intravenous New Bag/Given 03/28/16 2310)  DULoxetine (CYMBALTA) DR capsule 60 mg (not administered)  Eluxadoline TABS 75 mg (not administered)  Eslicarbazepine Acetate TABS 1,600 mg (not administered)  promethazine (PHENERGAN) tablet 12.5 mg (not administered)  pantoprazole (PROTONIX) EC tablet 40 mg (not administered)  nortriptyline (PAMELOR) capsule 50 mg (not administered)  lisinopril (PRINIVIL,ZESTRIL) tablet 5 mg (not administered)  Gabapentin Enacarbil TBCR 600 mg (not administered)  baclofen (LIORESAL) tablet 20 mg (not administered)  insulin detemir (LEVEMIR) injection 20 Units (not administered)  insulin aspart (novoLOG) injection 4 Units (not administered)  insulin aspart (novoLOG) injection 0-15  Units (not administered)  ondansetron (ZOFRAN) injection 4 mg (not administered)  ciprofloxacin (CIPRO) IVPB 400 mg (0 mg Intravenous Stopped 03/29/16 0153)  ondansetron (  ZOFRAN) injection 4 mg (4 mg Intravenous Given 03/28/16 2310)  HYDROmorphone (DILAUDID) injection 0.5 mg (0.5 mg Intravenous Given 03/28/16 2310)  HYDROmorphone (DILAUDID) injection 0.5 mg (0.5 mg Intravenous Given 03/29/16 0035)     Initial Impression / Assessment and Plan / ED Course  I have reviewed the triage vital signs and the nursing notes.  Pertinent lab results that were available during my care of the patient were reviewed by me and considered in my medical decision making (see chart for details).    On arrival O2 SAT was documented 90% @ 1:45 am O2SAT is 98% on R/A.  Consult with Hospitalist and will place patient in tele observation and continue antibiotics.  I discussed the plan with the patient and she agrees.  Final Clinical Impressions(s) / ED Diagnoses   Final diagnoses:  Pyelonephritis    New Prescriptions New Prescriptions   No medications on file    I personally performed the services described in this documentation, which was scribed in my presence. The recorded information has been reviewed and is accurate.     [id number] [place], NP 03/29/16 0210    Tanna Furry, MD 04/09/16 [phone]

## 2016-03-28 NOTE — ED Triage Notes (Signed)
Pt states she is been having burning sensation for almost a week on urination and now he is no able to have any output, having nausea but no vomiting, c/o 10/10 right side flank pain. Hx of kidney infection on the past, denies any fever or chills.

## 2016-03-29 ENCOUNTER — Observation Stay (HOSPITAL_COMMUNITY): Payer: Medicare HMO

## 2016-03-29 ENCOUNTER — Encounter (HOSPITAL_COMMUNITY): Payer: Self-pay

## 2016-03-29 DIAGNOSIS — E118 Type 2 diabetes mellitus with unspecified complications: Secondary | ICD-10-CM | POA: Diagnosis not present

## 2016-03-29 DIAGNOSIS — Z794 Long term (current) use of insulin: Secondary | ICD-10-CM

## 2016-03-29 DIAGNOSIS — G8929 Other chronic pain: Secondary | ICD-10-CM | POA: Diagnosis present

## 2016-03-29 DIAGNOSIS — N12 Tubulo-interstitial nephritis, not specified as acute or chronic: Secondary | ICD-10-CM | POA: Diagnosis present

## 2016-03-29 DIAGNOSIS — R109 Unspecified abdominal pain: Secondary | ICD-10-CM | POA: Diagnosis not present

## 2016-03-29 DIAGNOSIS — R1084 Generalized abdominal pain: Secondary | ICD-10-CM

## 2016-03-29 DIAGNOSIS — G894 Chronic pain syndrome: Secondary | ICD-10-CM | POA: Diagnosis present

## 2016-03-29 LAB — GLUCOSE, CAPILLARY
GLUCOSE-CAPILLARY: 187 mg/dL — AB (ref 65–99)
GLUCOSE-CAPILLARY: 136 mg/dL — AB (ref 65–99)
GLUCOSE-CAPILLARY: 169 mg/dL — AB (ref 65–99)
Glucose-Capillary: 164 mg/dL — ABNORMAL HIGH (ref 65–99)

## 2016-03-29 LAB — LACTIC ACID, PLASMA: Lactic Acid, Venous: 0.9 mmol/L (ref 0.5–1.9)

## 2016-03-29 MED ORDER — IOPAMIDOL (ISOVUE-300) INJECTION 61%
INTRAVENOUS | Status: AC
Start: 2016-03-29 — End: 2016-03-29
  Filled 2016-03-29: qty 30

## 2016-03-29 MED ORDER — HYDROMORPHONE HCL 2 MG/ML IJ SOLN
0.5000 mg | Freq: Once | INTRAMUSCULAR | Status: AC
Start: 2016-03-29 — End: 2016-03-29
  Administered 2016-03-29: 0.5 mg via INTRAVENOUS
  Filled 2016-03-29: qty 1

## 2016-03-29 MED ORDER — ELUXADOLINE 75 MG PO TABS
75.0000 mg | ORAL_TABLET | Freq: Two times a day (BID) | ORAL | Status: DC
  Administered 2016-03-30 (×3): 75 mg via ORAL
  Administered 2016-03-31: 11:00:00 via ORAL
  Filled 2016-03-29 (×2): qty 1

## 2016-03-29 MED ORDER — DULOXETINE HCL 60 MG PO CPEP
60.0000 mg | ORAL_CAPSULE | Freq: Every day | ORAL | Status: DC
  Administered 2016-03-29 – 2016-03-31 (×3): 60 mg via ORAL
  Filled 2016-03-29 (×3): qty 1

## 2016-03-29 MED ORDER — BACLOFEN 20 MG PO TABS
20.0000 mg | ORAL_TABLET | Freq: Four times a day (QID) | ORAL | Status: DC
  Administered 2016-03-29 – 2016-03-31 (×10): 20 mg via ORAL
  Filled 2016-03-29 (×12): qty 1

## 2016-03-29 MED ORDER — ENOXAPARIN SODIUM 40 MG/0.4ML ~~LOC~~ SOLN
40.0000 mg | SUBCUTANEOUS | Status: DC
  Administered 2016-03-29 – 2016-03-31 (×3): 40 mg via SUBCUTANEOUS
  Filled 2016-03-29 (×4): qty 0.4

## 2016-03-29 MED ORDER — GABAPENTIN ENACARBIL ER 600 MG PO TBCR
600.0000 mg | EXTENDED_RELEASE_TABLET | Freq: Two times a day (BID) | ORAL | Status: DC
  Administered 2016-03-29 – 2016-03-31 (×4): 600 mg via ORAL
  Filled 2016-03-29 (×4): qty 1

## 2016-03-29 MED ORDER — INFLUENZA VAC SPLIT QUAD 0.5 ML IM SUSY
0.5000 mL | PREFILLED_SYRINGE | INTRAMUSCULAR | Status: DC
  Filled 2016-03-29 (×2): qty 0.5

## 2016-03-29 MED ORDER — INSULIN ASPART 100 UNIT/ML ~~LOC~~ SOLN
0.0000 [IU] | Freq: Three times a day (TID) | SUBCUTANEOUS | Status: DC
  Administered 2016-03-29: 4 [IU] via SUBCUTANEOUS
  Administered 2016-03-29: 2 [IU] via SUBCUTANEOUS
  Administered 2016-03-29 – 2016-03-30 (×2): 3 [IU] via SUBCUTANEOUS
  Administered 2016-03-30 – 2016-03-31 (×3): 2 [IU] via SUBCUTANEOUS

## 2016-03-29 MED ORDER — CIPROFLOXACIN IN D5W 400 MG/200ML IV SOLN
400.0000 mg | Freq: Two times a day (BID) | INTRAVENOUS | Status: DC
  Administered 2016-03-29 – 2016-03-31 (×5): 400 mg via INTRAVENOUS
  Filled 2016-03-29 (×5): qty 200

## 2016-03-29 MED ORDER — PANTOPRAZOLE SODIUM 40 MG PO TBEC
40.0000 mg | DELAYED_RELEASE_TABLET | Freq: Every day | ORAL | Status: DC
  Administered 2016-03-29 – 2016-03-31 (×3): 40 mg via ORAL
  Filled 2016-03-29 (×3): qty 1

## 2016-03-29 MED ORDER — INSULIN ASPART 100 UNIT/ML ~~LOC~~ SOLN
4.0000 [IU] | Freq: Three times a day (TID) | SUBCUTANEOUS | Status: DC
  Administered 2016-03-29 – 2016-03-31 (×8): 4 [IU] via SUBCUTANEOUS

## 2016-03-29 MED ORDER — NORTRIPTYLINE HCL 25 MG PO CAPS
50.0000 mg | ORAL_CAPSULE | Freq: Every day | ORAL | Status: DC
  Administered 2016-03-29 – 2016-03-30 (×3): 50 mg via ORAL
  Filled 2016-03-29 (×4): qty 2

## 2016-03-29 MED ORDER — KETOROLAC TROMETHAMINE 30 MG/ML IJ SOLN
30.0000 mg | Freq: Once | INTRAMUSCULAR | Status: AC
  Administered 2016-03-29: 30 mg via INTRAVENOUS
  Filled 2016-03-29: qty 1

## 2016-03-29 MED ORDER — ONDANSETRON HCL 4 MG/2ML IJ SOLN: 4.0000 mg | Freq: Three times a day (TID) | INTRAMUSCULAR | Status: DC | PRN

## 2016-03-29 MED ORDER — TAPENTADOL HCL ER 150 MG PO TB12: 150.0000 mg | ORAL_TABLET | Freq: Two times a day (BID) | ORAL | Status: DC

## 2016-03-29 MED ORDER — TAPENTADOL HCL 50 MG PO TABS
50.0000 mg | ORAL_TABLET | Freq: Four times a day (QID) | ORAL | Status: DC
  Administered 2016-03-29 – 2016-03-31 (×10): 50 mg via ORAL
  Filled 2016-03-29 (×10): qty 1

## 2016-03-29 MED ORDER — INSULIN DETEMIR 100 UNIT/ML ~~LOC~~ SOLN
20.0000 [IU] | Freq: Every day | SUBCUTANEOUS | Status: DC
  Administered 2016-03-29 – 2016-03-30 (×2): 20 [IU] via SUBCUTANEOUS
  Filled 2016-03-29 (×3): qty 0.2

## 2016-03-29 MED ORDER — LISINOPRIL 5 MG PO TABS
5.0000 mg | ORAL_TABLET | Freq: Every day | ORAL | Status: DC
Start: 2016-03-29 — End: 2016-03-31
  Administered 2016-03-29 – 2016-03-31 (×3): 5 mg via ORAL
  Filled 2016-03-29 (×3): qty 1

## 2016-03-29 MED ORDER — IOPAMIDOL (ISOVUE-300) INJECTION 61%
INTRAVENOUS | Status: AC
  Administered 2016-03-29: 100 mL via INTRAVENOUS
  Filled 2016-03-29: qty 100

## 2016-03-29 MED ORDER — HYDROMORPHONE HCL 1 MG/ML IJ SOLN
0.5000 mg | Freq: Once | INTRAMUSCULAR | Status: AC
  Administered 2016-03-29: 0.5 mg via INTRAVENOUS
  Filled 2016-03-29: qty 1

## 2016-03-29 MED ORDER — PROMETHAZINE HCL 25 MG PO TABS
12.5000 mg | ORAL_TABLET | Freq: Two times a day (BID) | ORAL | Status: DC
  Administered 2016-03-29 – 2016-03-31 (×6): 12.5 mg via ORAL
  Filled 2016-03-29 (×6): qty 1

## 2016-03-29 MED ORDER — ONDANSETRON HCL 4 MG/2ML IJ SOLN
4.0000 mg | Freq: Four times a day (QID) | INTRAMUSCULAR | Status: DC | PRN
  Administered 2016-03-30: 4 mg via INTRAVENOUS
  Filled 2016-03-29: qty 2

## 2016-03-29 MED ORDER — ESLICARBAZEPINE ACETATE 800 MG PO TABS
1600.0000 mg | ORAL_TABLET | Freq: Every day | ORAL | Status: DC
  Administered 2016-03-29 – 2016-03-30 (×2): 1600 mg via ORAL
  Filled 2016-03-29 (×4): qty 1

## 2016-03-29 NOTE — H&P (Signed)
History and Physical    Andrea Wade JOI:[id number] DOB: 15-Jun-1970 DOA: 03/28/2016   PCP: Daphene Jaeger, PA-C Chief Complaint:  Chief Complaint  Patient presents with  . Urinary Retention  . Flank Pain    HPI: Andrea Wade is a 46 y.o. female with medical history significant of DM2, chronic pain.  Patient presents to the ED with c/o acute urinary retention, pelvic and flank pain.  Symptoms onset a couple of days ago.  Symptoms persistent and worsening.  Nausea, subjective fever.  No h/o UTIs.  Mild dysuria today.  ED Course: Tm 99.5.  UA shows UTI, WBC 16k.  Started on cipro.  Review of Systems: As per HPI otherwise 10 point review of systems negative.    Past Medical History:  Diagnosis Date  . Anemia   . Anxiety   . Arthritis    "joints" (04/17/2013)  . Chronic back pain    "all over" (04/17/2013)  . Crohn disease ([place])   . Daily headache   . Depression   . Diabetes type 2, uncontrolled ([place])   . 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 GERD (gastroesophageal reflux disease)   . Migraine    "q day" (04/17/2013)  . MS (multiple sclerosis) ([employer])   . Pneumonia    "about 10 times in my lifetime" (04/17/2013)    Past Surgical History:  Procedure Laterality Date  . CARDIAC CATHETERIZATION N/A 10/08/2015   Procedure: Left Heart Cath and Coronary Angiography;  Surgeon: Peter M Martinique, MD;  Location: [facility];  Service: Cardiovascular;  Laterality: N/A;  . CHOLECYSTECTOMY  1994  . TUBAL LIGATION  1993     reports that she has been smoking Cigarettes.  She has a 10.50 pack-year smoking history. She has never used smokeless tobacco. She reports that she does not drink alcohol or use drugs.  Allergies  Allergen Reactions  . Codeine Hives, Itching and Palpitations  . Peanut-Containing Drug Products Anaphylaxis  . Aspirin Itching    High doses only-baby is ok  . Penicillins Hives, Itching and Nausea And Vomiting    Family  History  Problem Relation Age of Onset  . Diabetes Mother   . Heart disease Mother   . Stroke Mother   . Heart failure Mother   . Heart disease Father   . Stroke Father   . Heart failure Father   . Heart attack Brother   . Healthy Brother       Prior to Admission medications   Medication Sig Start Date End Date Taking? Authorizing Provider  baclofen (LIORESAL) 20 MG tablet Take 20 mg by mouth 4 (four) times daily.    Yes Historical Provider, MD  Canagliflozin-Metformin HCl (INVOKAMET) [phone] MG TABS Take 1 tablet by mouth 2 (two) times daily.   Yes Historical Provider, MD  DULoxetine (CYMBALTA) 60 MG capsule Take 60 mg by mouth daily.   Yes Historical Provider, MD  Eluxadoline (VIBERZI) 75 MG TABS Take 75 mg by mouth 2 (two) times daily.   Yes Historical Provider, MD  Eslicarbazepine Acetate (APTIOM) 800 MG TABS Take 1,600 mg by mouth at bedtime.   Yes Historical Provider, MD  Gabapentin Enacarbil (HORIZANT) 600 MG TBCR Take 600 mg by mouth 2 (two) times daily.    Yes Historical Provider, MD  insulin aspart (NOVOLOG FLEXPEN) 100 UNIT/ML FlexPen Inject 14-28 Units into the skin 3 (three) times daily with meals. SLIDING SCALE   Yes Historical Provider, MD  insulin detemir (LEVEMIR) 100 unit/ml SOLN Inject 40 Units into the skin at bedtime.   Yes Historical Provider, MD  lisinopril (PRINIVIL,ZESTRIL) 5 MG tablet Take 1 tablet (5 mg total) by mouth daily. 10/08/15  Yes Ripudeep Krystal Eaton, MD  nortriptyline (PAMELOR) 25 MG capsule Take 50 mg by mouth at bedtime.    Yes Historical Provider, MD  pantoprazole (PROTONIX) 40 MG tablet Take 1 tablet (40 mg total) by mouth daily. 11/16/13  Yes Lorayne Marek, MD  promethazine (PHENERGAN) 12.5 MG tablet Take 12.5 mg by mouth 2 (two) times daily.   Yes Historical Provider, MD  tapentadol (NUCYNTA) 50 MG tablet Take 50 mg by mouth every 6 (six) hours.   Yes Historical Provider, MD  Tapentadol HCl 150 MG TB12 Take 150 mg by mouth 2 (two) times daily.   Yes  Historical Provider, MD    Physical Exam: Vitals:   03/28/16 2021 03/29/16 0021 03/29/16 0154  BP: 145/78  119/64  Pulse: 107  92  Resp: 19    Temp: 99.5 F (37.5 C) 99.9 F (37.7 C) 99.9 F (37.7 C)  TempSrc: Oral Rectal Rectal  SpO2: 90%  98%  Weight: 108.9 kg (240 lb)    Height: 5' 9"  (1.753 m)        Constitutional: NAD, calm, comfortable Eyes: PERRL, lids and conjunctivae normal ENMT: Mucous membranes are moist. Posterior pharynx clear of any exudate or lesions.Normal dentition.  Neck: normal, supple, no masses, no thyromegaly Respiratory: clear to auscultation bilaterally, no wheezing, no crackles. Normal respiratory effort. No accessory muscle use.  Cardiovascular: Regular rate and rhythm, no murmurs / rubs / gallops. No extremity edema. 2+ pedal pulses. No carotid bruits.  Abdomen: no tenderness, no masses palpated. No hepatosplenomegaly. Bowel sounds positive.  Musculoskeletal: no clubbing / cyanosis. No joint deformity upper and lower extremities. Good ROM, no contractures. Normal muscle tone.  Skin: no rashes, lesions, ulcers. No induration Neurologic: CN 2-12 grossly intact. Sensation intact, DTR normal. Strength 5/5 in all 4.  Psychiatric: Normal judgment and insight. Alert and oriented x 3. Normal mood.    Labs on Admission: I have personally reviewed following labs and imaging studies  CBC:  Recent Labs Lab 03/28/16 2101  WBC 16.0*  HGB 10.6*  HCT 34.7*  MCV 64.4*  PLT 607*   Basic Metabolic Panel:  Recent Labs Lab 03/28/16 2101  NA 132*  K 3.9  CL 97*  CO2 22  GLUCOSE 180*  BUN 5*  CREATININE 0.67  CALCIUM 9.1   GFR: Estimated Creatinine Clearance: 115.5 mL/min (by C-G formula based on SCr of 0.67 mg/dL). Liver Function Tests: No results for input(s): AST, ALT, ALKPHOS, BILITOT, PROT, ALBUMIN in the last 168 hours. No results for input(s): LIPASE, AMYLASE in the last 168 hours. No results for input(s): AMMONIA in the last 168  hours. Coagulation Profile: No results for input(s): INR, PROTIME in the last 168 hours. Cardiac Enzymes: No results for input(s): CKTOTAL, CKMB, CKMBINDEX, TROPONINI in the last 168 hours. BNP (last 3 results) No results for input(s): PROBNP in the last 8760 hours. HbA1C: No results for input(s): HGBA1C in the last 72 hours. CBG: No results for input(s): GLUCAP in the last 168 hours. Lipid Profile: No results for input(s): CHOL, HDL, LDLCALC, TRIG, CHOLHDL, LDLDIRECT in the last 72 hours. Thyroid Function Tests: No results for input(s): TSH, T4TOTAL, FREET4, T3FREE, THYROIDAB in the last 72 hours. Anemia Panel: No results for input(s): VITAMINB12, FOLATE, FERRITIN, TIBC, IRON, RETICCTPCT in the last  72 hours. Urine analysis:    Component Value Date/Time   COLORURINE YELLOW 03/28/2016 2225   APPEARANCEUR HAZY (A) 03/28/2016 2225   LABSPEC 1.020 03/28/2016 2225   PHURINE 7.0 03/28/2016 2225   GLUCOSEU >=500 (A) 03/28/2016 2225   HGBUR NEGATIVE 03/28/2016 2225   BILIRUBINUR NEGATIVE 03/28/2016 2225   KETONESUR 20 (A) 03/28/2016 2225   PROTEINUR 100 (A) 03/28/2016 2225   UROBILINOGEN 0.2 07/22/2013 1703   NITRITE NEGATIVE 03/28/2016 2225   LEUKOCYTESUR MODERATE (A) 03/28/2016 2225   Sepsis Labs: @LABRCNTIP (procalcitonin:4,lacticidven:4) )No results found for this or any previous visit (from the past 240 hour(s)).   Radiological Exams on Admission: No results found.  EKG: Independently reviewed.  Assessment/Plan Principal Problem:   Pyelonephritis Active Problems:   DM2 (diabetes mellitus, type 2) (HCC)   Chronic pain syndrome    1. Pyelonephritis - 1. Cipro 2. Cultures pending 3. zofran PRN nausea 4. Tele monitor for tachycardia 2. DM2 - 1. Half home levemir 2. Hold invokanamet 3. Mod scale mealtime 4. And mod scale SSI AC/HS 3. Chronic pain syndrome - resume home meds 4. 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

## 2016-03-29 NOTE — Progress Notes (Signed)
Pt's home medicines has been counted and dropped to the pharmacy

## 2016-03-29 NOTE — ED Notes (Signed)
Repaged [facility]

## 2016-03-29 NOTE — Progress Notes (Signed)
Subjective: Patient admitted this morning, see detailed H&P by Dr Alcario Drought, 46 y.o. female with medical history significant of DM2, chronic pain.  Patient presents to the ED with c/o acute urinary retention, pelvic and flank pain.  Symptoms onset a couple of days ago.  Symptoms persistent and worsening.  Nausea, subjective fever.  Patient continues to have abdominal pain. Denies nausea and vomiting at this time. She has a history of IBS-D.   Vitals:   03/29/16 0323 03/29/16 0754  BP: 139/79 (!) 111/59  Pulse: 87 83  Resp:  20  Temp: 98.4 F (36.9 C) 98 F (36.7 C)        On exam- generalized abdominal tenderness to palpation, positive CVA tenderness on both right and left.  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 We'll also check CT abdomen pelvis to rule out underlying intra-abdominal pathology for patients continuing symptoms including pain/nausea and vomiting Moderate sliding scale insulin Continue home medications  [facility] Hospitalist [phone]

## 2016-03-29 NOTE — Progress Notes (Signed)
Pt is stable, vitals stable, complaining of abdominal pain and getting pain medicine as scheduled, CT abdomen is scheduled to be done, IV fluid is continue @125cc /hr, tolerating meds and diet, will continue to monitor the patient.

## 2016-03-29 NOTE — Progress Notes (Signed)
Pt drank 2 l of contrast for  abdominal CT scan. Tolerated well, now waiting for procedure to be done

## 2016-03-30 DIAGNOSIS — Z833 Family history of diabetes mellitus: Secondary | ICD-10-CM | POA: Diagnosis not present

## 2016-03-30 DIAGNOSIS — E871 Hypo-osmolality and hyponatremia: Secondary | ICD-10-CM | POA: Diagnosis not present

## 2016-03-30 DIAGNOSIS — E118 Type 2 diabetes mellitus with unspecified complications: Secondary | ICD-10-CM | POA: Diagnosis not present

## 2016-03-30 DIAGNOSIS — E119 Type 2 diabetes mellitus without complications: Secondary | ICD-10-CM | POA: Diagnosis not present

## 2016-03-30 DIAGNOSIS — K219 Gastro-esophageal reflux disease without esophagitis: Secondary | ICD-10-CM | POA: Diagnosis not present

## 2016-03-30 DIAGNOSIS — G894 Chronic pain syndrome: Secondary | ICD-10-CM | POA: Diagnosis not present

## 2016-03-30 DIAGNOSIS — M549 Dorsalgia, unspecified: Secondary | ICD-10-CM | POA: Diagnosis not present

## 2016-03-30 DIAGNOSIS — N10 Acute tubulo-interstitial nephritis: Secondary | ICD-10-CM | POA: Diagnosis not present

## 2016-03-30 DIAGNOSIS — N12 Tubulo-interstitial nephritis, not specified as acute or chronic: Secondary | ICD-10-CM | POA: Diagnosis not present

## 2016-03-30 DIAGNOSIS — G40909 Epilepsy, unspecified, not intractable, without status epilepticus: Secondary | ICD-10-CM | POA: Diagnosis not present

## 2016-03-30 DIAGNOSIS — Z794 Long term (current) use of insulin: Secondary | ICD-10-CM | POA: Diagnosis not present

## 2016-03-30 DIAGNOSIS — Z823 Family history of stroke: Secondary | ICD-10-CM | POA: Diagnosis not present

## 2016-03-30 DIAGNOSIS — Z9049 Acquired absence of other specified parts of digestive tract: Secondary | ICD-10-CM | POA: Diagnosis not present

## 2016-03-30 DIAGNOSIS — Z8249 Family history of ischemic heart disease and other diseases of the circulatory system: Secondary | ICD-10-CM | POA: Diagnosis not present

## 2016-03-30 DIAGNOSIS — K5090 Crohn's disease, unspecified, without complications: Secondary | ICD-10-CM | POA: Diagnosis not present

## 2016-03-30 DIAGNOSIS — B9561 Methicillin susceptible Staphylococcus aureus infection as the cause of diseases classified elsewhere: Secondary | ICD-10-CM | POA: Diagnosis not present

## 2016-03-30 DIAGNOSIS — R339 Retention of urine, unspecified: Secondary | ICD-10-CM | POA: Diagnosis present

## 2016-03-30 DIAGNOSIS — G35 Multiple sclerosis: Secondary | ICD-10-CM | POA: Diagnosis not present

## 2016-03-30 LAB — COMPREHENSIVE METABOLIC PANEL
ALT: 13 U/L — ABNORMAL LOW (ref 14–54)
AST: 17 U/L (ref 15–41)
Albumin: 2.7 g/dL — ABNORMAL LOW (ref 3.5–5.0)
Alkaline Phosphatase: 88 U/L (ref 38–126)
Anion gap: 9 (ref 5–15)
BILIRUBIN TOTAL: 0.4 mg/dL (ref 0.3–1.2)
BUN: 5 mg/dL — ABNORMAL LOW (ref 6–20)
CO2: 26 mmol/L (ref 22–32)
Calcium: 8.6 mg/dL — ABNORMAL LOW (ref 8.9–10.3)
Chloride: 101 mmol/L (ref 101–111)
Creatinine, Ser: 0.59 mg/dL (ref 0.44–1.00)
GFR calc Af Amer: >60 mL/min (ref >60)
Glucose, Bld: 113 mg/dL — ABNORMAL HIGH (ref 65–99)
POTASSIUM: 3.9 mmol/L (ref 3.5–5.1)
Sodium: 136 mmol/L (ref 135–145)
TOTAL PROTEIN: 7.1 g/dL (ref 6.5–8.1)

## 2016-03-30 LAB — GLUCOSE, CAPILLARY
GLUCOSE-CAPILLARY: 142 mg/dL — AB (ref 65–99)
GLUCOSE-CAPILLARY: 143 mg/dL — AB (ref 65–99)
GLUCOSE-CAPILLARY: 133 mg/dL — AB (ref 65–99)
Glucose-Capillary: 159 mg/dL — ABNORMAL HIGH (ref 65–99)
Glucose-Capillary: 109 mg/dL — ABNORMAL HIGH (ref 65–99)

## 2016-03-30 LAB — CBC
HEMATOCRIT: 32.1 % — AB (ref 36.0–46.0)
Hemoglobin: 9.4 g/dL — ABNORMAL LOW (ref 12.0–15.0)
MCH: 19.2 pg — ABNORMAL LOW (ref 26.0–34.0)
MCHC: 29.3 g/dL — AB (ref 30.0–36.0)
MCV: 65.5 fL — AB (ref 78.0–100.0)
Platelets: 394 10*3/uL (ref 150–400)
RBC: 4.9 MIL/uL (ref 3.87–5.11)
RDW: 19.9 % — AB (ref 11.5–15.5)
WBC: 11.7 10*3/uL — ABNORMAL HIGH (ref 4.0–10.5)

## 2016-03-30 MED ORDER — VANCOMYCIN HCL 10 G IV SOLR
2000.0000 mg | Freq: Once | INTRAVENOUS | Status: AC
  Administered 2016-03-30: 2000 mg via INTRAVENOUS
  Filled 2016-03-30: qty 2000

## 2016-03-30 MED ORDER — HYDROMORPHONE HCL 1 MG/ML IJ SOLN
1.0000 mg | INTRAMUSCULAR | Status: DC | PRN
  Administered 2016-03-30 – 2016-03-31 (×6): 1 mg via INTRAVENOUS
  Filled 2016-03-30 (×6): qty 1

## 2016-03-30 MED ORDER — VANCOMYCIN HCL 10 G IV SOLR
1500.0000 mg | Freq: Two times a day (BID) | INTRAVENOUS | Status: DC
  Administered 2016-03-31: 1500 mg via INTRAVENOUS
  Filled 2016-03-30 (×2): qty 1500

## 2016-03-30 NOTE — Progress Notes (Signed)
Patient alert and oriented. Patient has a history of previous falls. Patient is refusing bed alarm. Patient educated. Verbalized understanding. Will continue to monitor.

## 2016-03-30 NOTE — Progress Notes (Signed)
Pharmacy Antibiotic Note Andrea Wade is a 46 y.o. female admitted on 03/28/2016 with UTI/pyleonephritis. Urine culture currently growing staph aureus. Pharmacy has been consulted for vancomycin dosing.  Plan: 1. Vancomycin 1500 mg IV every 12 hours 2. Scr every 72 hours while on vancomycin  3. Await final c/s and narrow as feasible; could likely stop Ciprofloxacin as not reliable in treating staph aureus  Height: 5' 9"  (175.3 cm) Weight: 242 lb 11.2 oz (110.1 kg) (Scale B) IBW/kg (Calculated) : 66.2  Temp (24hrs), Avg:98.4 F (36.9 C), Min:97.5 F (36.4 C), Max:99 F (37.2 C)   Recent Labs Lab 03/28/16 2101 03/28/16 2325 03/29/16 0207 03/30/16 0239  WBC 16.0*  --   --  11.7*  CREATININE 0.67  --   --  0.59  LATICACIDVEN  --  0.9 0.9  --     Estimated Creatinine Clearance: 116.2 mL/min (by C-G formula based on SCr of 0.59 mg/dL).    Allergies  Allergen Reactions  . Codeine Hives, Itching and Palpitations  . Peanut-Containing Drug Products Anaphylaxis  . Aspirin Itching    High doses only-baby is ok  . Penicillins Hives, Itching and Nausea And Vomiting    Antimicrobials this admission: Ciprofloxacin 1/27 >>  Vancomycin 1/29 >>   Microbiology results: 1/27 BCx: px 1/27 UCx: staph aureus (final c/s pending)   Thank you for allowing pharmacy to be a part of this patient's care.  Vincenza Hews, PharmD, BCPS 03/30/2016, 3:29 PM

## 2016-03-30 NOTE — Progress Notes (Signed)
[facility]  PROGRESS NOTE  Ciin Brazzel KNL:[id number] DOB: May 17, 1970 DOA: 03/28/2016 PCP: Daphene Jaeger, PA-C   Brief HPI:    46 y.o. female with medical history significant of DM2, chronic pain.  Patient presents to the ED with c/o acute urinary retention, pelvic and flank pain.  Symptoms onset a couple of days ago.  Symptoms persistent and worsening.  Nausea, subjective fever.  No h/o UTIs.      Subjective   Patient still complains of right flank pain, she was seen at [facility] in December and had CT scan abdomen which showed right pleural nephritis for which she was treated with antibiotics as outpatient. Unfortunately at that time no urine culture was obtained. Which is confirmed by calling microbiology at [facility].   Assessment and plan  Right pyelonephritis- CT scan of the abdomen confirms presence of pyelonephritis, which has been present since 02/29/2016. It appears slightly worse as compared to previous. Urine culture growing 80,000 colonies of staph aureus, final sensitivities pending, will start vancomycin per pharmacy consultation. Continue ciprofloxacin for now.  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  Chronic pain syndrome- continue Dilaudid 1 mg every 4 hours when necessary      prophylaxis: Lovenox  Code status -full code  Family Communication:  no family present at bedside   Disposition Plan: Pending urine culture results    Consultants:  None   Procedures -None  Continuous infusions . sodium chloride 125 mL/hr at 03/30/16 1347      Antibiotics:   Anti-infectives    Start     Dose/Rate Route Frequency Ordered Stop   03/29/16 1000  ciprofloxacin (CIPRO) IVPB 400 mg     400 mg 200 mL/hr over 60 Minutes Intravenous Every 12 hours 03/29/16 0220     03/28/16 2245  ciprofloxacin (CIPRO) IVPB 400 mg      400 mg 200 mL/hr over 60 Minutes Intravenous  Once 03/28/16 2232 03/29/16 0153       Objective   Vitals:   03/29/16 2015 03/30/16 0000 03/30/16 0700 03/30/16 0750  BP: (!) 104/52 (!) 106/47 (!) 128/59 133/65  Pulse: 87 85 78 78  Resp: 19 20 19 18   Temp: 99 F (37.2 C) 98.5 F (36.9 C) 98.2 F (36.8 C) 97.5 F (36.4 C)  TempSrc: Oral Oral Oral Oral  SpO2: 95% 96% 97% 98%  Weight:   110.1 kg (242 lb 11.2 oz)   Height:        Intake/Output Summary (Last 24 hours) at 03/30/16 1512 Last data filed at 03/30/16 0919  Gross per 24 hour  Intake              600 ml  Output             1900 ml  Net            -1300 ml   Filed Weights   03/28/16 2021 03/29/16 0323 03/30/16 0700  Weight: 108.9 kg (240 lb) 108.9 kg (240 lb 1.6 oz) 110.1 kg (242 lb 11.2 oz)     Physical Examination:  General exam: Appears calm and comfortable. Respiratory system: Clear to auscultation. Respiratory effort normal. Cardiovascular system:  RRR. No  murmurs, rubs, gallops. No pedal edema. GI system: Abdomen is nondistended, positive right CVA tenderness, mild generalized tenderness to palpation throught abdomen. No organomegaly.  Central nervous system. No focal neurological deficits. 5 x 5 power in all  extremities. Skin: No rashes, lesions or ulcers. Psychiatry: Alert, oriented x 3.Judgement and insight appear normal. Affect normal.    Data Reviewed: I have personally reviewed following labs and imaging studies  CBG:  Recent Labs Lab 03/29/16 1652 03/29/16 2018 03/30/16 0112 03/30/16 0655 03/30/16 1137  GLUCAP 136* 169* 142* 143* 159*    CBC:  Recent Labs Lab 03/28/16 2101 03/30/16 0239  WBC 16.0* 11.7*  HGB 10.6* 9.4*  HCT 34.7* 32.1*  MCV 64.4* 65.5*  PLT 441* 270    Basic Metabolic Panel:  Recent Labs Lab 03/28/16 2101 03/30/16 0239  NA 132* 136  K 3.9 3.9  CL 97* 101  CO2 22 26  GLUCOSE 180* 113*  BUN 5* 5*  CREATININE 0.67 0.59  CALCIUM 9.1 8.6*    Recent  Results (from the past 240 hour(s))  Urine culture     Status: Abnormal (Preliminary result)   Collection Time: 03/28/16 10:25 PM  Result Value Ref Range Status   Specimen Description URINE, CATHETERIZED  Final   Special Requests NONE  Final   Culture 80,000 COLONIES/mL STAPHYLOCOCCUS AUREUS (A)  Final   Report Status PENDING  Incomplete     Liver Function Tests:  Recent Labs Lab 03/30/16 0239  AST 17  ALT 13*  ALKPHOS 88  BILITOT 0.4  PROT 7.1  ALBUMIN 2.7*      Studies: Ct Abdomen Pelvis W Contrast  Result Date: 03/29/2016 CLINICAL DATA:  Lower abdominal pain for 1 week EXAM: CT ABDOMEN AND PELVIS WITH CONTRAST TECHNIQUE: Multidetector CT imaging of the abdomen and pelvis was performed using the standard protocol following bolus administration of intravenous contrast. CONTRAST:  115m ISOVUE-300 IOPAMIDOL (ISOVUE-300) INJECTION 61% COMPARISON:  02/29/16 FINDINGS: Lower chest: No acute abnormality. Hepatobiliary: Liver is diffusely fatty infiltrated. The gallbladder has been surgically removed. Pancreas: Unremarkable. No pancreatic ductal dilatation or surrounding inflammatory changes. Spleen: Normal in size without focal abnormality. Adrenals/Urinary Tract: The adrenal glands and left kidney are within normal limits. Right kidney demonstrates some decreased enhancement and perinephric stranding. Delayed images demonstrate geographic large area of decreased attenuation and enhancement. These changes are consistent with pyelonephritis. These are similar but somewhat more marked than that seen on the prior exam. Mild fullness of the right collecting system is noted without definitive calculus. Bladder is well distended. Stomach/Bowel: Stomach is within normal limits. Appendix appears normal. No evidence of bowel wall thickening, distention, or inflammatory changes. Vascular/Lymphatic: No significant vascular findings are present. No enlarged abdominal or pelvic lymph nodes. Reproductive:  Uterus and bilateral adnexa are unremarkable. Changes of tubal ligation are noted. Other: Small fat containing umbilical hernia is seen. No free pelvic fluid is noted. Musculoskeletal: Degenerative changes of lumbar spine are noted. IMPRESSION: Changes consistent with right pyelonephritis. The area of poor enhancement is increased in size when compared with the prior exam. Chronic changes as described above. Electronically Signed   By: MInez CatalinaM.D.   On: 03/29/2016 16:28    Scheduled Meds: . baclofen  20 mg Oral QID  . ciprofloxacin  400 mg Intravenous Q12H  . DULoxetine  60 mg Oral Daily  . Eluxadoline  75 mg Oral BID  . enoxaparin (LOVENOX) injection  40 mg Subcutaneous Q24H  . Eslicarbazepine Acetate  1,600 mg Oral QHS  . Gabapentin Enacarbil  600 mg Oral BID  . Influenza vac split quadrivalent PF  0.5 mL Intramuscular Tomorrow-1000  . insulin aspart  0-15 Units Subcutaneous TID WC  . insulin aspart  4 Units Subcutaneous  TID WC  . insulin detemir  20 Units Subcutaneous QHS  . lisinopril  5 mg Oral Daily  . nortriptyline  50 mg Oral QHS  . pantoprazole  40 mg Oral Daily  . promethazine  12.5 mg Oral BID  . tapentadol  50 mg Oral Q6H      Time spent: 25 min  [facility] Pager [phone]. If 7PM-7AM, please contact night-coverage at [email or web address], Office  [phone]  password [email or web address] 03/30/2016, 3:12 PM  LOS: 0 days

## 2016-03-31 LAB — BASIC METABOLIC PANEL
Anion gap: 7 (ref 5–15)
BUN: <5 mg/dL — ABNORMAL LOW (ref 6–20)
CALCIUM: 8.5 mg/dL — AB (ref 8.9–10.3)
CO2: 26 mmol/L (ref 22–32)
CREATININE: 0.53 mg/dL (ref 0.44–1.00)
Chloride: 102 mmol/L (ref 101–111)
GFR calc Af Amer: >60 mL/min (ref >60)
GLUCOSE: 110 mg/dL — AB (ref 65–99)
Potassium: 3.8 mmol/L (ref 3.5–5.1)
Sodium: 135 mmol/L (ref 135–145)

## 2016-03-31 LAB — URINE CULTURE

## 2016-03-31 LAB — GLUCOSE, CAPILLARY
Glucose-Capillary: 121 mg/dL — ABNORMAL HIGH (ref 65–99)
Glucose-Capillary: 138 mg/dL — ABNORMAL HIGH (ref 65–99)

## 2016-03-31 LAB — CBC
HEMATOCRIT: 31.3 % — AB (ref 36.0–46.0)
Hemoglobin: 9 g/dL — ABNORMAL LOW (ref 12.0–15.0)
MCH: 19.1 pg — AB (ref 26.0–34.0)
MCHC: 28.8 g/dL — AB (ref 30.0–36.0)
MCV: 66.3 fL — AB (ref 78.0–100.0)
PLATELETS: 368 10*3/uL (ref 150–400)
RBC: 4.72 MIL/uL (ref 3.87–5.11)
RDW: 20.2 % — AB (ref 11.5–15.5)
WBC: 7.6 10*3/uL (ref 4.0–10.5)

## 2016-03-31 MED ORDER — CIPROFLOXACIN HCL 500 MG PO TABS: 500.0000 mg | ORAL_TABLET | Freq: Two times a day (BID) | ORAL | 0 refills | Status: DC

## 2016-03-31 NOTE — Discharge Summary (Signed)
Physician Discharge Summary  Andrea Wade ZES:[id number] DOB: 1970/12/24 DOA: 03/28/2016  PCP: Daphene Jaeger, PA-C  Admit date: 03/28/2016 Discharge date: 03/31/2016  Time spent: 25* minutes  Recommendations for Outpatient Follow-up:  1. Follow up PCP in 2 weeks 2. Will need repeat CT abdomen in 4 weeks to check the resolution of the pyelonephritis   Discharge Diagnoses:  Principal Problem:   Pyelonephritis Active Problems:   DM2 (diabetes mellitus, type 2) ([place])   Chronic pain syndrome   Discharge Condition: Stable  Diet recommendation: Carb modified diet  Filed Weights   03/29/16 0323 03/30/16 0700 03/31/16 0607  Weight: 108.9 kg (240 lb 1.6 oz) 110.1 kg (242 lb 11.2 oz) 112.8 kg (248 lb 11.2 oz)    History of present illness:  46 y.o.femalewith medical history significant of DM2, chronic pain. Patient presents to the ED with c/o acute urinary retention, pelvic and flank pain. Symptoms onset a couple of days ago. Symptoms persistent and worsening Patient still complains of right flank pain, she was seen at [facility] in December and had CT scan abdomen which showed right pleural nephritis for which she was treated with antibiotics as outpatient. Unfortunately at that time no urine culture was obtained. Which is confirmed by calling microbiology at [facility] Course:   Right pyelonephritis- CT scan of the abdomen confirms presence of pyelonephritis, which has been present since 02/29/2016. It appears slightly worse as compared to previous. Urine culture growing 80,000 colonies of staph aureus, sensitive to Ciprofloxacin, patient was started on both Cipro and Vancomycin empirically. Will discharge on Po Cipro 500 mg po bid for 11 more days t0o complete 14 days of antibiotics.  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  Chronic pain syndrome- continue Nyucenta    Procedures:  None   Consultations:  None   Discharge Exam: Vitals:   03/31/16 0607 03/31/16 0900  BP: (!) 110/53 119/65  Pulse: 77 86  Resp: 18 18  Temp: 98.4 F (36.9 C) 97.9 F (36.6 C)    General: Appears in no acute distress Cardiovascular: RRR, S1S2 Respiratory: Clear bilaterally  Discharge Instructions   Discharge Instructions    Diet - low sodium heart healthy    Complete by:  As directed    Increase activity slowly    Complete by:  As directed      Current Discharge Medication List    START taking these medications   Details  ciprofloxacin (CIPRO) 500 MG tablet Take 1 tablet (500 mg total) by mouth 2 (two) times daily. Qty: 22 tablet, Refills: 0      CONTINUE these medications which have NOT CHANGED   Details  baclofen (LIORESAL) 20 MG tablet Take 20 mg by mouth 4 (four) times daily.     Canagliflozin-Metformin HCl (INVOKAMET) [phone] MG TABS Take 1 tablet by mouth 2 (two) times daily.    DULoxetine (CYMBALTA) 60 MG capsule Take 60 mg by mouth daily.    Eluxadoline (VIBERZI) 75 MG TABS Take 75 mg by mouth 2 (two) times daily.    Eslicarbazepine Acetate (APTIOM) 800 MG TABS Take 1,600 mg by mouth at bedtime.    Gabapentin Enacarbil (HORIZANT) 600 MG TBCR Take 600 mg by mouth 2 (two) times daily.     insulin aspart (NOVOLOG FLEXPEN) 100 UNIT/ML FlexPen Inject 14-28 Units into the skin 3 (three) times daily with meals. SLIDING SCALE    insulin detemir (LEVEMIR) 100 unit/ml SOLN Inject 40 Units into  the skin at bedtime.    lisinopril (PRINIVIL,ZESTRIL) 5 MG tablet Take 1 tablet (5 mg total) by mouth daily. Qty: 30 tablet, Refills: 3    nortriptyline (PAMELOR) 25 MG capsule Take 50 mg by mouth at bedtime.     pantoprazole (PROTONIX) 40 MG tablet Take 1 tablet (40 mg total) by mouth daily. Qty: 30 tablet, Refills: 2   Associated Diagnoses: Gastroesophageal reflux disease without esophagitis    promethazine (PHENERGAN) 12.5 MG tablet Take 12.5  mg by mouth 2 (two) times daily.    tapentadol (NUCYNTA) 50 MG tablet Take 50 mg by mouth every 6 (six) hours.    Tapentadol HCl 150 MG TB12 Take 150 mg by mouth 2 (two) times daily.       Allergies  Allergen Reactions  . Codeine Hives, Itching and Palpitations  . Peanut-Containing Drug Products Anaphylaxis  . Aspirin Itching    High doses only-baby is ok  . Penicillins Hives, Itching and Nausea And Vomiting      The results of significant diagnostics from this hospitalization (including imaging, microbiology, ancillary and laboratory) are listed below for reference.    Significant Diagnostic Studies: Ct Abdomen Pelvis W Contrast  Result Date: 03/29/2016 CLINICAL DATA:  Lower abdominal pain for 1 week EXAM: CT ABDOMEN AND PELVIS WITH CONTRAST TECHNIQUE: Multidetector CT imaging of the abdomen and pelvis was performed using the standard protocol following bolus administration of intravenous contrast. CONTRAST:  11m ISOVUE-300 IOPAMIDOL (ISOVUE-300) INJECTION 61% COMPARISON:  02/29/16 FINDINGS: Lower chest: No acute abnormality. Hepatobiliary: Liver is diffusely fatty infiltrated. The gallbladder has been surgically removed. Pancreas: Unremarkable. No pancreatic ductal dilatation or surrounding inflammatory changes. Spleen: Normal in size without focal abnormality. Adrenals/Urinary Tract: The adrenal glands and left kidney are within normal limits. Right kidney demonstrates some decreased enhancement and perinephric stranding. Delayed images demonstrate geographic large area of decreased attenuation and enhancement. These changes are consistent with pyelonephritis. These are similar but somewhat more marked than that seen on the prior exam. Mild fullness of the right collecting system is noted without definitive calculus. Bladder is well distended. Stomach/Bowel: Stomach is within normal limits. Appendix appears normal. No evidence of bowel wall thickening, distention, or inflammatory  changes. Vascular/Lymphatic: No significant vascular findings are present. No enlarged abdominal or pelvic lymph nodes. Reproductive: Uterus and bilateral adnexa are unremarkable. Changes of tubal ligation are noted. Other: Small fat containing umbilical hernia is seen. No free pelvic fluid is noted. Musculoskeletal: Degenerative changes of lumbar spine are noted. IMPRESSION: Changes consistent with right pyelonephritis. The area of poor enhancement is increased in size when compared with the prior exam. Chronic changes as described above. Electronically Signed   By: MInez CatalinaM.D.   On: 03/29/2016 16:28    Microbiology: Recent Results (from the past 240 hour(s))  Urine culture     Status: Abnormal   Collection Time: 03/28/16 10:25 PM  Result Value Ref Range Status   Specimen Description URINE, CATHETERIZED  Final   Special Requests NONE  Final   Culture 80,000 COLONIES/mL STAPHYLOCOCCUS AUREUS (A)  Final   Report Status 03/31/2016 FINAL  Final   Organism ID, Bacteria STAPHYLOCOCCUS AUREUS (A)  Final      Susceptibility   Staphylococcus aureus - MIC*    CIPROFLOXACIN <=0.5 SENSITIVE Sensitive     GENTAMICIN <=0.5 SENSITIVE Sensitive     NITROFURANTOIN <=16 SENSITIVE Sensitive     OXACILLIN 0.5 SENSITIVE Sensitive     TETRACYCLINE <=1 SENSITIVE Sensitive     VANCOMYCIN <=  0.5 SENSITIVE Sensitive     TRIMETH/SULFA <=10 SENSITIVE Sensitive     CLINDAMYCIN <=0.25 SENSITIVE Sensitive     RIFAMPIN <=0.5 SENSITIVE Sensitive     Inducible Clindamycin NEGATIVE Sensitive     * 80,000 COLONIES/mL STAPHYLOCOCCUS AUREUS  Blood culture (routine x 2)     Status: None (Preliminary result)   Collection Time: 03/28/16 11:02 PM  Result Value Ref Range Status   Specimen Description BLOOD LEFT ANTECUBITAL  Final   Special Requests BOTTLES DRAWN AEROBIC AND ANAEROBIC 5CC  Final   Culture NO GROWTH 1 DAY  Final   Report Status PENDING  Incomplete  Blood culture (routine x 2)     Status: None (Preliminary  result)   Collection Time: 03/28/16 11:05 PM  Result Value Ref Range Status   Specimen Description BLOOD RIGHT WRIST  Final   Special Requests BOTTLES DRAWN AEROBIC AND ANAEROBIC 5CC EA  Final   Culture NO GROWTH 1 DAY  Final   Report Status PENDING  Incomplete     Labs: Basic Metabolic Panel:  Recent Labs Lab 03/28/16 2101 03/30/16 0239 03/31/16 0556  NA 132* 136 135  K 3.9 3.9 3.8  CL 97* 101 102  CO2 22 26 26   GLUCOSE 180* 113* 110*  BUN 5* 5* <5*  CREATININE 0.67 0.59 0.53  CALCIUM 9.1 8.6* 8.5*   Liver Function Tests:  Recent Labs Lab 03/30/16 0239  AST 17  ALT 13*  ALKPHOS 88  BILITOT 0.4  PROT 7.1  ALBUMIN 2.7*   No results for input(s): LIPASE, AMYLASE in the last 168 hours. No results for input(s): AMMONIA in the last 168 hours. CBC:  Recent Labs Lab 03/28/16 2101 03/30/16 0239 03/31/16 0556  WBC 16.0* 11.7* 7.6  HGB 10.6* 9.4* 9.0*  HCT 34.7* 32.1* 31.3*  MCV 64.4* 65.5* 66.3*  PLT 441* 394 368    CBG:  Recent Labs Lab 03/30/16 0655 03/30/16 1137 03/30/16 1717 03/30/16 2143 03/31/16 0746  GLUCAP 143* 159* 109* 133* 121*       Signed:  Eleonore Chiquito S MD.  [facility] 03/31/2016, 10:23 AM

## 2016-03-31 NOTE — Progress Notes (Signed)
Pt discharge education went over at bedside. Pt IV discontinued, catheter intact and telemetry removed.Pt has all discharge paper work, home medications and belongings. Pt home medications counted in front of patient, pt has all inventory, signed paper in chart by RN and patient.  Andrea Wade

## 2016-04-03 LAB — CULTURE, BLOOD (ROUTINE X 2)
CULTURE: NO GROWTH
CULTURE: NO GROWTH

## 2016-04-09 DIAGNOSIS — N12 Tubulo-interstitial nephritis, not specified as acute or chronic: Secondary | ICD-10-CM | POA: Diagnosis not present

## 2016-04-09 DIAGNOSIS — Z6838 Body mass index (BMI) 38.0-38.9, adult: Secondary | ICD-10-CM | POA: Diagnosis not present

## 2016-04-09 DIAGNOSIS — I10 Essential (primary) hypertension: Secondary | ICD-10-CM | POA: Diagnosis not present

## 2016-04-09 DIAGNOSIS — Z09 Encounter for follow-up examination after completed treatment for conditions other than malignant neoplasm: Secondary | ICD-10-CM | POA: Diagnosis not present

## 2016-04-13 DIAGNOSIS — M5416 Radiculopathy, lumbar region: Secondary | ICD-10-CM | POA: Diagnosis not present

## 2016-04-13 DIAGNOSIS — M5136 Other intervertebral disc degeneration, lumbar region: Secondary | ICD-10-CM | POA: Diagnosis not present

## 2016-04-13 DIAGNOSIS — M5030 Other cervical disc degeneration, unspecified cervical region: Secondary | ICD-10-CM | POA: Diagnosis not present

## 2016-04-13 DIAGNOSIS — R69 Illness, unspecified: Secondary | ICD-10-CM | POA: Diagnosis not present

## 2016-04-13 DIAGNOSIS — Z79891 Long term (current) use of opiate analgesic: Secondary | ICD-10-CM | POA: Diagnosis not present

## 2016-04-13 DIAGNOSIS — G629 Polyneuropathy, unspecified: Secondary | ICD-10-CM | POA: Diagnosis not present

## 2016-04-13 DIAGNOSIS — M5134 Other intervertebral disc degeneration, thoracic region: Secondary | ICD-10-CM | POA: Diagnosis not present

## 2016-04-13 DIAGNOSIS — E119 Type 2 diabetes mellitus without complications: Secondary | ICD-10-CM | POA: Diagnosis not present

## 2016-04-13 DIAGNOSIS — Z720 Tobacco use: Secondary | ICD-10-CM | POA: Diagnosis not present

## 2016-04-13 DIAGNOSIS — G894 Chronic pain syndrome: Secondary | ICD-10-CM | POA: Diagnosis not present

## 2016-04-15 ENCOUNTER — Encounter (HOSPITAL_COMMUNITY): Payer: Self-pay | Admitting: Emergency Medicine

## 2016-04-15 ENCOUNTER — Inpatient Hospital Stay (HOSPITAL_COMMUNITY)
Admission: EM | Admit: 2016-04-15 | Discharge: 2016-04-20 | DRG: 193 | Disposition: A | Discharge status: Home / Self Care | Payer: Medicare HMO | Attending: Internal Medicine | Admitting: Internal Medicine

## 2016-04-15 ENCOUNTER — Emergency Department (HOSPITAL_COMMUNITY): Payer: Medicare HMO

## 2016-04-15 DIAGNOSIS — D649 Anemia, unspecified: Secondary | ICD-10-CM | POA: Diagnosis present

## 2016-04-15 DIAGNOSIS — R112 Nausea with vomiting, unspecified: Secondary | ICD-10-CM

## 2016-04-15 DIAGNOSIS — K219 Gastro-esophageal reflux disease without esophagitis: Secondary | ICD-10-CM | POA: Diagnosis present

## 2016-04-15 DIAGNOSIS — J9601 Acute respiratory failure with hypoxia: Secondary | ICD-10-CM | POA: Diagnosis not present

## 2016-04-15 DIAGNOSIS — G35 Multiple sclerosis: Secondary | ICD-10-CM | POA: Diagnosis not present

## 2016-04-15 DIAGNOSIS — Z833 Family history of diabetes mellitus: Secondary | ICD-10-CM | POA: Diagnosis not present

## 2016-04-15 DIAGNOSIS — J111 Influenza due to unidentified influenza virus with other respiratory manifestations: Secondary | ICD-10-CM | POA: Diagnosis present

## 2016-04-15 DIAGNOSIS — R0902 Hypoxemia: Secondary | ICD-10-CM

## 2016-04-15 DIAGNOSIS — E119 Type 2 diabetes mellitus without complications: Secondary | ICD-10-CM

## 2016-04-15 DIAGNOSIS — J9600 Acute respiratory failure, unspecified whether with hypoxia or hypercapnia: Secondary | ICD-10-CM | POA: Diagnosis present

## 2016-04-15 DIAGNOSIS — Z823 Family history of stroke: Secondary | ICD-10-CM

## 2016-04-15 DIAGNOSIS — E118 Type 2 diabetes mellitus with unspecified complications: Secondary | ICD-10-CM | POA: Diagnosis not present

## 2016-04-15 DIAGNOSIS — R05 Cough: Secondary | ICD-10-CM | POA: Diagnosis not present

## 2016-04-15 DIAGNOSIS — I959 Hypotension, unspecified: Secondary | ICD-10-CM | POA: Diagnosis present

## 2016-04-15 DIAGNOSIS — R609 Edema, unspecified: Secondary | ICD-10-CM | POA: Diagnosis not present

## 2016-04-15 DIAGNOSIS — E872 Acidosis: Secondary | ICD-10-CM | POA: Diagnosis present

## 2016-04-15 DIAGNOSIS — A419 Sepsis, unspecified organism: Secondary | ICD-10-CM

## 2016-04-15 DIAGNOSIS — R7989 Other specified abnormal findings of blood chemistry: Secondary | ICD-10-CM

## 2016-04-15 DIAGNOSIS — J189 Pneumonia, unspecified organism: Secondary | ICD-10-CM | POA: Diagnosis present

## 2016-04-15 DIAGNOSIS — Z720 Tobacco use: Secondary | ICD-10-CM

## 2016-04-15 DIAGNOSIS — R079 Chest pain, unspecified: Secondary | ICD-10-CM | POA: Diagnosis not present

## 2016-04-15 DIAGNOSIS — R69 Illness, unspecified: Secondary | ICD-10-CM | POA: Diagnosis not present

## 2016-04-15 DIAGNOSIS — F17210 Nicotine dependence, cigarettes, uncomplicated: Secondary | ICD-10-CM | POA: Diagnosis present

## 2016-04-15 DIAGNOSIS — G8929 Other chronic pain: Secondary | ICD-10-CM | POA: Diagnosis present

## 2016-04-15 DIAGNOSIS — Y95 Nosocomial condition: Secondary | ICD-10-CM | POA: Diagnosis present

## 2016-04-15 DIAGNOSIS — Z8249 Family history of ischemic heart disease and other diseases of the circulatory system: Secondary | ICD-10-CM | POA: Diagnosis not present

## 2016-04-15 DIAGNOSIS — R0602 Shortness of breath: Secondary | ICD-10-CM | POA: Diagnosis not present

## 2016-04-15 DIAGNOSIS — E1165 Type 2 diabetes mellitus with hyperglycemia: Secondary | ICD-10-CM | POA: Diagnosis not present

## 2016-04-15 DIAGNOSIS — G40909 Epilepsy, unspecified, not intractable, without status epilepticus: Secondary | ICD-10-CM | POA: Diagnosis present

## 2016-04-15 DIAGNOSIS — J1100 Influenza due to unidentified influenza virus with unspecified type of pneumonia: Principal | ICD-10-CM | POA: Diagnosis present

## 2016-04-15 DIAGNOSIS — N390 Urinary tract infection, site not specified: Secondary | ICD-10-CM | POA: Diagnosis not present

## 2016-04-15 DIAGNOSIS — R740 Nonspecific elevation of levels of transaminase and lactic acid dehydrogenase [LDH]: Secondary | ICD-10-CM | POA: Diagnosis not present

## 2016-04-15 DIAGNOSIS — G894 Chronic pain syndrome: Secondary | ICD-10-CM | POA: Diagnosis present

## 2016-04-15 DIAGNOSIS — E876 Hypokalemia: Secondary | ICD-10-CM | POA: Diagnosis present

## 2016-04-15 DIAGNOSIS — Z794 Long term (current) use of insulin: Secondary | ICD-10-CM

## 2016-04-15 DIAGNOSIS — N12 Tubulo-interstitial nephritis, not specified as acute or chronic: Secondary | ICD-10-CM | POA: Diagnosis present

## 2016-04-15 LAB — CBC WITH DIFFERENTIAL/PLATELET
Basophils Absolute: 0 10*3/uL (ref 0.0–0.1)
Basophils Relative: 0 %
EOS PCT: 0 %
Eosinophils Absolute: 0 10*3/uL (ref 0.0–0.7)
HCT: 35.2 % — ABNORMAL LOW (ref 36.0–46.0)
Hemoglobin: 10.5 g/dL — ABNORMAL LOW (ref 12.0–15.0)
LYMPHS ABS: 1.4 10*3/uL (ref 0.7–4.0)
Lymphocytes Relative: 19 %
MCH: 19.1 pg — AB (ref 26.0–34.0)
MCHC: 29.8 g/dL — AB (ref 30.0–36.0)
MCV: 63.9 fL — ABNORMAL LOW (ref 78.0–100.0)
MONO ABS: 0.5 10*3/uL (ref 0.1–1.0)
Monocytes Relative: 7 %
NEUTROS PCT: 74 %
Neutro Abs: 5.7 10*3/uL (ref 1.7–7.7)
PLATELETS: 479 10*3/uL — AB (ref 150–400)
RBC: 5.51 MIL/uL — ABNORMAL HIGH (ref 3.87–5.11)
RDW: 19.9 % — ABNORMAL HIGH (ref 11.5–15.5)
WBC: 7.6 10*3/uL (ref 4.0–10.5)

## 2016-04-15 LAB — I-STAT CG4 LACTIC ACID, ED
LACTIC ACID, VENOUS: 1.69 mmol/L (ref 0.5–1.9)
Lactic Acid, Venous: 2.4 mmol/L (ref 0.5–1.9)

## 2016-04-15 LAB — COMPREHENSIVE METABOLIC PANEL
ALT: 24 U/L (ref 14–54)
AST: 33 U/L (ref 15–41)
Albumin: 3.4 g/dL — ABNORMAL LOW (ref 3.5–5.0)
Alkaline Phosphatase: 82 U/L (ref 38–126)
Anion gap: 10 (ref 5–15)
CHLORIDE: 94 mmol/L — AB (ref 101–111)
CO2: 27 mmol/L (ref 22–32)
CREATININE: 0.64 mg/dL (ref 0.44–1.00)
Calcium: 9 mg/dL (ref 8.9–10.3)
GFR calc Af Amer: >60 mL/min (ref >60)
GFR calc non Af Amer: >60 mL/min (ref >60)
Glucose, Bld: 205 mg/dL — ABNORMAL HIGH (ref 65–99)
Potassium: 4.1 mmol/L (ref 3.5–5.1)
SODIUM: 131 mmol/L — AB (ref 135–145)
Total Bilirubin: 0.4 mg/dL (ref 0.3–1.2)
Total Protein: 8.3 g/dL — ABNORMAL HIGH (ref 6.5–8.1)

## 2016-04-15 MED ORDER — VANCOMYCIN HCL 10 G IV SOLR
1500.0000 mg | Freq: Once | INTRAVENOUS | Status: AC
  Administered 2016-04-16: 1500 mg via INTRAVENOUS
  Filled 2016-04-15: qty 1500

## 2016-04-15 MED ORDER — ELUXADOLINE 75 MG PO TABS: 75.0000 mg | ORAL_TABLET | Freq: Two times a day (BID) | ORAL | Status: DC

## 2016-04-15 MED ORDER — VANCOMYCIN HCL 10 G IV SOLR
1500.0000 mg | Freq: Two times a day (BID) | INTRAVENOUS | Status: DC
  Administered 2016-04-16 – 2016-04-17 (×4): 1500 mg via INTRAVENOUS
  Filled 2016-04-15 (×5): qty 1500

## 2016-04-15 MED ORDER — ONDANSETRON HCL 4 MG PO TABS
4.0000 mg | ORAL_TABLET | Freq: Four times a day (QID) | ORAL | Status: DC | PRN
  Administered 2016-04-18: 4 mg via ORAL
  Filled 2016-04-15: qty 1

## 2016-04-15 MED ORDER — ONDANSETRON HCL 4 MG/2ML IJ SOLN
4.0000 mg | Freq: Once | INTRAMUSCULAR | Status: AC
  Administered 2016-04-15: 4 mg via INTRAVENOUS
  Filled 2016-04-15: qty 2

## 2016-04-15 MED ORDER — TAPENTADOL HCL 50 MG PO TABS: 50.0000 mg | ORAL_TABLET | Freq: Four times a day (QID) | ORAL | Status: DC

## 2016-04-15 MED ORDER — METOCLOPRAMIDE HCL 5 MG/ML IJ SOLN
10.0000 mg | Freq: Once | INTRAMUSCULAR | Status: AC
  Administered 2016-04-15: 10 mg via INTRAVENOUS
  Filled 2016-04-15: qty 2

## 2016-04-15 MED ORDER — OSELTAMIVIR PHOSPHATE 75 MG PO CAPS: 75.0000 mg | ORAL_CAPSULE | Freq: Two times a day (BID) | ORAL | Status: DC

## 2016-04-15 MED ORDER — ONDANSETRON HCL 4 MG/2ML IJ SOLN
INTRAMUSCULAR | Status: AC
  Filled 2016-04-15: qty 2

## 2016-04-15 MED ORDER — PANTOPRAZOLE SODIUM 40 MG PO TBEC
40.0000 mg | DELAYED_RELEASE_TABLET | Freq: Every day | ORAL | Status: DC
  Administered 2016-04-16 – 2016-04-20 (×5): 40 mg via ORAL
  Filled 2016-04-15 (×5): qty 1

## 2016-04-15 MED ORDER — ACETAMINOPHEN 650 MG RE SUPP
650.0000 mg | Freq: Four times a day (QID) | RECTAL | Status: DC | PRN
  Administered 2016-04-16: 650 mg via RECTAL
  Filled 2016-04-15: qty 1

## 2016-04-15 MED ORDER — PROMETHAZINE HCL 25 MG/ML IJ SOLN
12.5000 mg | Freq: Four times a day (QID) | INTRAMUSCULAR | Status: DC | PRN
  Administered 2016-04-16 – 2016-04-17 (×4): 25 mg via INTRAVENOUS
  Administered 2016-04-18: 12.5 mg via INTRAVENOUS
  Administered 2016-04-18: 25 mg via INTRAVENOUS
  Administered 2016-04-18: 12.5 mg via INTRAVENOUS
  Administered 2016-04-19 – 2016-04-20 (×4): 25 mg via INTRAVENOUS
  Filled 2016-04-15 (×11): qty 1

## 2016-04-15 MED ORDER — ALBUTEROL SULFATE (2.5 MG/3ML) 0.083% IN NEBU: 2.5000 mg | INHALATION_SOLUTION | RESPIRATORY_TRACT | Status: DC | PRN

## 2016-04-15 MED ORDER — IPRATROPIUM BROMIDE 0.02 % IN SOLN: 0.5000 mg | RESPIRATORY_TRACT | Status: DC | PRN

## 2016-04-15 MED ORDER — INSULIN ASPART 100 UNIT/ML ~~LOC~~ SOLN
0.0000 [IU] | SUBCUTANEOUS | Status: DC
  Administered 2016-04-16 – 2016-04-17 (×2): 3 [IU] via SUBCUTANEOUS
  Administered 2016-04-17: 2 [IU] via SUBCUTANEOUS
  Administered 2016-04-17: 3 [IU] via SUBCUTANEOUS
  Filled 2016-04-15: qty 1

## 2016-04-15 MED ORDER — ONDANSETRON 4 MG PO TBDP
4.0000 mg | ORAL_TABLET | Freq: Once | ORAL | Status: AC
  Administered 2016-04-15: 4 mg via ORAL

## 2016-04-15 MED ORDER — SODIUM CHLORIDE 0.9 % IV BOLUS (SEPSIS)
1000.0000 mL | Freq: Once | INTRAVENOUS | Status: AC
  Administered 2016-04-15: 1000 mL via INTRAVENOUS

## 2016-04-15 MED ORDER — OSELTAMIVIR PHOSPHATE 75 MG PO CAPS
75.0000 mg | ORAL_CAPSULE | Freq: Two times a day (BID) | ORAL | Status: AC
Start: 2016-04-16 — End: 2016-04-20
  Administered 2016-04-16 – 2016-04-20 (×9): 75 mg via ORAL
  Filled 2016-04-15 (×10): qty 1

## 2016-04-15 MED ORDER — DEXTROSE 5 % IV SOLN
500.0000 mg | Freq: Once | INTRAVENOUS | Status: AC
  Administered 2016-04-15: 500 mg via INTRAVENOUS
  Filled 2016-04-15: qty 500

## 2016-04-15 MED ORDER — ENOXAPARIN SODIUM 60 MG/0.6ML ~~LOC~~ SOLN
55.0000 mg | SUBCUTANEOUS | Status: DC
  Administered 2016-04-16 – 2016-04-19 (×5): 55 mg via SUBCUTANEOUS
  Filled 2016-04-15 (×5): qty 0.6

## 2016-04-15 MED ORDER — TAPENTADOL HCL ER 150 MG PO TB12: 150.0000 mg | ORAL_TABLET | Freq: Two times a day (BID) | ORAL | Status: DC

## 2016-04-15 MED ORDER — DEXTROSE 5 % IV SOLN
1.0000 g | Freq: Once | INTRAVENOUS | Status: AC
  Administered 2016-04-15: 1 g via INTRAVENOUS
  Filled 2016-04-15: qty 10

## 2016-04-15 MED ORDER — OSELTAMIVIR PHOSPHATE 75 MG PO CAPS
75.0000 mg | ORAL_CAPSULE | Freq: Once | ORAL | Status: AC
  Administered 2016-04-15: 75 mg via ORAL
  Filled 2016-04-15: qty 1

## 2016-04-15 MED ORDER — ACETAMINOPHEN 325 MG PO TABS
650.0000 mg | ORAL_TABLET | Freq: Four times a day (QID) | ORAL | Status: DC | PRN
  Administered 2016-04-17 – 2016-04-18 (×2): 650 mg via ORAL
  Filled 2016-04-15 (×3): qty 2

## 2016-04-15 MED ORDER — SODIUM CHLORIDE 0.9 % IV SOLN
INTRAVENOUS | Status: DC
  Administered 2016-04-15 – 2016-04-17 (×6): via INTRAVENOUS

## 2016-04-15 MED ORDER — ACETAMINOPHEN 325 MG PO TABS
ORAL_TABLET | ORAL | Status: AC
  Filled 2016-04-15: qty 2

## 2016-04-15 MED ORDER — DEXTROSE 5 % IV SOLN
1.0000 g | Freq: Three times a day (TID) | INTRAVENOUS | Status: DC
  Administered 2016-04-16 – 2016-04-20 (×14): 1 g via INTRAVENOUS
  Filled 2016-04-15 (×17): qty 1

## 2016-04-15 MED ORDER — IBUPROFEN 400 MG PO TABS
400.0000 mg | ORAL_TABLET | Freq: Once | ORAL | Status: AC
  Administered 2016-04-15: 400 mg via ORAL
  Filled 2016-04-15: qty 1

## 2016-04-15 MED ORDER — NORTRIPTYLINE HCL 25 MG PO CAPS
50.0000 mg | ORAL_CAPSULE | Freq: Every day | ORAL | Status: DC
  Administered 2016-04-16 – 2016-04-19 (×4): 50 mg via ORAL
  Filled 2016-04-15 (×5): qty 2

## 2016-04-15 MED ORDER — ALBUTEROL SULFATE (2.5 MG/3ML) 0.083% IN NEBU
5.0000 mg | INHALATION_SOLUTION | Freq: Once | RESPIRATORY_TRACT | Status: AC
  Administered 2016-04-15: 5 mg via RESPIRATORY_TRACT
  Filled 2016-04-15: qty 6

## 2016-04-15 MED ORDER — ONDANSETRON 4 MG PO TBDP
ORAL_TABLET | ORAL | Status: AC
  Filled 2016-04-15: qty 1

## 2016-04-15 MED ORDER — GABAPENTIN ENACARBIL ER 600 MG PO TBCR: 600.0000 mg | EXTENDED_RELEASE_TABLET | Freq: Two times a day (BID) | ORAL | Status: DC

## 2016-04-15 MED ORDER — SODIUM CHLORIDE 0.9 % IV BOLUS (SEPSIS)
1000.0000 mL | Freq: Once | INTRAVENOUS | Status: AC
  Administered 2016-04-16: 1000 mL via INTRAVENOUS

## 2016-04-15 MED ORDER — ESLICARBAZEPINE ACETATE 800 MG PO TABS: 1600.0000 mg | ORAL_TABLET | Freq: Every day | ORAL | Status: DC

## 2016-04-15 MED ORDER — BACLOFEN 10 MG PO TABS
20.0000 mg | ORAL_TABLET | Freq: Four times a day (QID) | ORAL | Status: DC
  Administered 2016-04-16 – 2016-04-20 (×15): 20 mg via ORAL
  Filled 2016-04-15: qty 2
  Filled 2016-04-15 (×8): qty 1
  Filled 2016-04-15: qty 2
  Filled 2016-04-15 (×2): qty 1
  Filled 2016-04-15 (×2): qty 2
  Filled 2016-04-15 (×6): qty 1

## 2016-04-15 MED ORDER — GUAIFENESIN ER 600 MG PO TB12
600.0000 mg | ORAL_TABLET | Freq: Two times a day (BID) | ORAL | Status: DC
  Administered 2016-04-16 – 2016-04-20 (×9): 600 mg via ORAL
  Filled 2016-04-15 (×9): qty 1

## 2016-04-15 MED ORDER — ACETAMINOPHEN 325 MG PO TABS
650.0000 mg | ORAL_TABLET | Freq: Once | ORAL | Status: AC | PRN
  Administered 2016-04-15: 650 mg via ORAL

## 2016-04-15 MED ORDER — DULOXETINE HCL 60 MG PO CPEP
60.0000 mg | ORAL_CAPSULE | Freq: Every day | ORAL | Status: DC
  Administered 2016-04-16 – 2016-04-20 (×5): 60 mg via ORAL
  Filled 2016-04-15 (×5): qty 1

## 2016-04-15 MED ORDER — ONDANSETRON HCL 4 MG/2ML IJ SOLN
4.0000 mg | Freq: Four times a day (QID) | INTRAMUSCULAR | Status: DC | PRN
  Administered 2016-04-16 – 2016-04-17 (×6): 4 mg via INTRAVENOUS
  Filled 2016-04-15 (×6): qty 2

## 2016-04-15 NOTE — ED Notes (Signed)
Requested hospital bed from [facility]

## 2016-04-15 NOTE — ED Notes (Signed)
O2 sats 90-91%, placed pt on 3L [place], sats 93%

## 2016-04-15 NOTE — ED Notes (Signed)
Pt had another episode of emesis & incontinence, linens changed, pt cleaned.

## 2016-04-15 NOTE — ED Notes (Signed)
EDP aware pt O2 sats 89-91% on 6L [place].

## 2016-04-15 NOTE — Progress Notes (Signed)
Pharmacy Antibiotic Note Andrea Wade is a 46 y.o. female admitted on 04/15/2016 with pneumonia.  Pharmacy has been consulted for Cefepime and vancomycin dosing.  Plan: 1. Vancomycin 1500 mg IV every 12 hours; goal trough 15 -20 2. Cefepime 1 gram IV every 8 hours 3. SCr every 72 hours while on vancomycin    Height: 5' 9"  (175.3 cm) Weight: 248 lb (112.5 kg) IBW/kg (Calculated) : 66.2  Temp (24hrs), Avg:101.2 F (38.4 C), Min:99.4 F (37.4 C), Max:103 F (39.4 C)   Recent Labs Lab 04/15/16 1643 04/15/16 1650 04/15/16 2032  WBC 7.6  --   --   CREATININE 0.64  --   --   LATICACIDVEN  --  1.69 2.40*    Estimated Creatinine Clearance: 117.5 mL/min (by C-G formula based on SCr of 0.64 mg/dL).    Allergies  Allergen Reactions  . Codeine Hives, Itching and Palpitations  . Peanut-Containing Drug Products Anaphylaxis  . Tape Other (See Comments)    Slight irritation  . Aspirin Itching    High doses only-baby is ok  . Penicillins Hives, Itching and Nausea And Vomiting    Allergy from childhood Has patient had a PCN reaction causing immediate rash, facial/tongue/throat swelling, SOB or lightheadedness with hypotension: Yes Has patient had a PCN reaction causing severe rash involving mucus membranes or skin necrosis: Unk Has patient had a PCN reaction that required hospitalization: Unk Has patient had a PCN reaction occurring within the last 10 years: No If all of the above answers are "NO", then may proceed with Cephalosporin use.      Antimicrobials this admission: 2/14 Cefepime >>  2/14 vancomycin >>   Dose adjustments this admission: n/a  Microbiology results: px  Thank you for allowing pharmacy to be a part of this patient's care.  Vincenza Hews, PharmD, BCPS 04/15/2016, 10:40 PM

## 2016-04-15 NOTE — ED Notes (Signed)
Pt had episode of emesis, pt was also incontinent of urine during emesis. Linens changed, given clean gown. EDP aware pt unable to tolerate fluids.

## 2016-04-15 NOTE — ED Triage Notes (Signed)
Pt sent here from urgent care in [place] -- states was told she had pneumonia and positive flu test. Sore throat, fever, headache.

## 2016-04-15 NOTE — ED Provider Notes (Signed)
[facility] DEPT Provider Note   CSN: [id number] Arrival date & time: 04/15/16  1612     History   Chief Complaint Chief Complaint  Patient presents with  . Influenza  . Pneumonia  . Emesis  . Diarrhea    HPI Andrea Wade is a 46 y.o. female.  Patient c/o prod cough, sore throat, runny nose, body aches, intermittent headaches, and fevers to 103, in past 1-2 days.  Symptoms persistent, constant. Went to urgent care, was told had pna and flu and to go to ER.  Patient denies severe headaches. No neck pain or stiffness. No trouble breathing or swallowing. Had diarrhea earlier, several times, watery.  Pt also c/o nausea and vomiting. Emesis not bloody. No abd pain. No gu c/o.    The history is provided by the patient.  Influenza  Presenting symptoms: cough, diarrhea, fever, headache, myalgias, sore throat and vomiting   Presenting symptoms: no shortness of breath   Associated symptoms: nasal congestion   Associated symptoms: no neck stiffness   Pneumonia  Associated symptoms include headaches. Pertinent negatives include no chest pain, no abdominal pain and no shortness of breath.  Emesis   Associated symptoms include cough, diarrhea, a fever, headaches and myalgias. Pertinent negatives include no abdominal pain.  Diarrhea   Associated symptoms include vomiting, headaches, myalgias and cough. Pertinent negatives include no abdominal pain.    Past Medical History:  Diagnosis Date  . Anemia   . Anxiety   . Arthritis    "joints" (04/17/2013)  . Chronic back pain    "all over" (04/17/2013)  . Crohn disease ([place])   . Daily headache   . Depression   . Diabetes type 2, uncontrolled ([place])   . 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 GERD (gastroesophageal reflux disease)   . Migraine    "q day" (04/17/2013)  . MS (multiple sclerosis) ([place])   . Pneumonia    "about 10 times in my lifetime" (04/17/2013)    Patient Active Problem List   Diagnosis Date Noted  . Pyelonephritis 03/29/2016  . Chronic pain syndrome 03/29/2016  . Hypochloremia   . Hyponatremia   . Diabetes mellitus due to underlying condition without complications ([place]) 59/56/3875  . Anemia, unspecified 10/05/2013  . Hypokalemia 10/05/2013  . Smoking 10/05/2013  . Menorrhagia with regular cycle 10/05/2013  . Esophageal reflux 10/05/2013  . Chest pain 04/17/2013  . Seizure ([place]) 02/24/2013  . Atypical chest pain 09/22/2012  . Noncompliance 09/22/2012  . Tobacco abuse 09/22/2012  . DM2 (diabetes mellitus, type 2) ([place]) 09/22/2012  . Protein-calorie malnutrition, severe ([place]) 09/22/2012    Past Surgical History:  Procedure Laterality Date  . CARDIAC CATHETERIZATION N/A 10/08/2015   Procedure: Left Heart Cath and Coronary Angiography;  Surgeon: Peter M Martinique, MD;  Location: [facility];  Service: Cardiovascular;  Laterality: N/A;  . CHOLECYSTECTOMY  1994  . TUBAL LIGATION  1993    OB History    No data available       Home Medications    Prior to Admission medications   Medication Sig Start Date End Date Taking? Authorizing Provider  baclofen (LIORESAL) 20 MG tablet Take 20 mg by mouth 4 (four) times daily.     Historical Provider, MD  Canagliflozin-Metformin HCl (INVOKAMET) [phone] MG TABS Take 1 tablet by mouth 2 (two) times daily.    Historical Provider, MD  ciprofloxacin (CIPRO) 500 MG tablet Take 1 tablet (500 mg total)  by mouth 2 (two) times daily. 03/31/16   Oswald Hillock, MD  DULoxetine (CYMBALTA) 60 MG capsule Take 60 mg by mouth daily.    Historical Provider, MD  Eluxadoline (VIBERZI) 75 MG TABS Take 75 mg by mouth 2 (two) times daily.    Historical Provider, MD  Eslicarbazepine Acetate (APTIOM) 800 MG TABS Take 1,600 mg by mouth at bedtime.    Historical Provider, MD  Gabapentin Enacarbil (HORIZANT) 600 MG TBCR Take 600 mg by mouth 2 (two) times daily.     Historical Provider, MD  insulin aspart (NOVOLOG FLEXPEN) 100 UNIT/ML  FlexPen Inject 14-28 Units into the skin 3 (three) times daily with meals. SLIDING SCALE    Historical Provider, MD  insulin detemir (LEVEMIR) 100 unit/ml SOLN Inject 40 Units into the skin at bedtime.    Historical Provider, MD  lisinopril (PRINIVIL,ZESTRIL) 5 MG tablet Take 1 tablet (5 mg total) by mouth daily. 10/08/15   Ripudeep Krystal Eaton, MD  nortriptyline (PAMELOR) 25 MG capsule Take 50 mg by mouth at bedtime.     Historical Provider, MD  pantoprazole (PROTONIX) 40 MG tablet Take 1 tablet (40 mg total) by mouth daily. 11/16/13   Lorayne Marek, MD  promethazine (PHENERGAN) 12.5 MG tablet Take 12.5 mg by mouth 2 (two) times daily.    Historical Provider, MD  tapentadol (NUCYNTA) 50 MG tablet Take 50 mg by mouth every 6 (six) hours.    Historical Provider, MD  Tapentadol HCl 150 MG TB12 Take 150 mg by mouth 2 (two) times daily.    Historical Provider, MD    Family History Family History  Problem Relation Age of Onset  . Diabetes Mother   . Heart disease Mother   . Stroke Mother   . Heart failure Mother   . Heart disease Father   . Stroke Father   . Heart failure Father   . Heart attack Brother   . Healthy Brother     Social History Social History  Substance Use Topics  . Smoking status: Current Every Day Smoker    Packs/day: 0.50    Years: 21.00    Types: Cigarettes  . Smokeless tobacco: Never Used  . Alcohol use No     Allergies   Codeine; Peanut-containing drug products; Aspirin; and Penicillins   Review of Systems Review of Systems  Constitutional: Positive for fever.  HENT: Positive for congestion and sore throat.   Eyes: Negative for redness.  Respiratory: Positive for cough. Negative for shortness of breath.   Cardiovascular: Negative for chest pain.  Gastrointestinal: Positive for diarrhea and vomiting. Negative for abdominal pain.  Genitourinary: Negative for dysuria and flank pain.  Musculoskeletal: Positive for myalgias. Negative for back pain, neck pain and  neck stiffness.  Skin: Negative for rash.  Neurological: Positive for headaches.  Hematological: Does not bruise/bleed easily.  Psychiatric/Behavioral: Negative for confusion.     Physical Exam Updated Vital Signs BP 102/66   Pulse (!) 122   Temp 103 F (39.4 C) (Oral)   Resp 20   Ht 5' 9"  (1.753 m)   Wt 112.5 kg   LMP 04/02/2016   SpO2 95%   BMI 36.62 kg/m   Physical Exam  Constitutional: She appears well-developed and well-nourished. No distress.  HENT:  Nasal congestion. Pharynx normal   Eyes: Conjunctivae are normal. No scleral icterus.  Neck: Neck supple. No tracheal deviation present.  No stiffness or rigidity.   Cardiovascular: Normal rate, normal heart sounds and intact distal pulses.  Exam reveals no gallop and no friction rub.   No murmur heard. Pulmonary/Chest: Effort normal. No respiratory distress.  RLL rale  Abdominal: Soft. Normal appearance and bowel sounds are normal. She exhibits no distension. There is no tenderness.  Genitourinary:  Genitourinary Comments: No cva tenderness  Musculoskeletal: She exhibits no edema or tenderness.  Lymphadenopathy:    She has no cervical adenopathy.  Neurological: She is alert.  Skin: Skin is warm and dry. No rash noted. She is not diaphoretic.  Psychiatric: She has a normal mood and affect.  Nursing note and vitals reviewed.    ED Treatments / Results  Labs (all labs ordered are listed, but only abnormal results are displayed) Results for orders placed or performed during the hospital encounter of 04/15/16  Comprehensive metabolic panel  Result Value Ref Range   Sodium 131 (L) 135 - 145 mmol/L   Potassium 4.1 3.5 - 5.1 mmol/L   Chloride 94 (L) 101 - 111 mmol/L   CO2 27 22 - 32 mmol/L   Glucose, Bld 205 (H) 65 - 99 mg/dL   BUN <5 (L) 6 - 20 mg/dL   Creatinine, Ser 0.64 0.44 - 1.00 mg/dL   Calcium 9.0 8.9 - 10.3 mg/dL   Total Protein 8.3 (H) 6.5 - 8.1 g/dL   Albumin 3.4 (L) 3.5 - 5.0 g/dL   AST 33 15 - 41  U/L   ALT 24 14 - 54 U/L   Alkaline Phosphatase 82 38 - 126 U/L   Total Bilirubin 0.4 0.3 - 1.2 mg/dL   GFR calc non Af Amer >60 >60 mL/min   GFR calc Af Amer >60 >60 mL/min   Anion gap 10 5 - 15  CBC with Differential  Result Value Ref Range   WBC 7.6 4.0 - 10.5 K/uL   RBC 5.51 (H) 3.87 - 5.11 MIL/uL   Hemoglobin 10.5 (L) 12.0 - 15.0 g/dL   HCT 35.2 (L) 36.0 - 46.0 %   MCV 63.9 (L) 78.0 - 100.0 fL   MCH 19.1 (L) 26.0 - 34.0 pg   MCHC 29.8 (L) 30.0 - 36.0 g/dL   RDW 19.9 (H) 11.5 - 15.5 %   Platelets 479 (H) 150 - 400 K/uL   Neutrophils Relative % 74 %   Lymphocytes Relative 19 %   Monocytes Relative 7 %   Eosinophils Relative 0 %   Basophils Relative 0 %   Neutro Abs 5.7 1.7 - 7.7 K/uL   Lymphs Abs 1.4 0.7 - 4.0 K/uL   Monocytes Absolute 0.5 0.1 - 1.0 K/uL   Eosinophils Absolute 0.0 0.0 - 0.7 K/uL   Basophils Absolute 0.0 0.0 - 0.1 K/uL   RBC Morphology TARGET CELLS    Smear Review LARGE PLATELETS PRESENT   I-Stat CG4 Lactic Acid, ED  Result Value Ref Range   Lactic Acid, Venous 1.69 0.5 - 1.9 mmol/L  I-Stat CG4 Lactic Acid, ED  Result Value Ref Range   Lactic Acid, Venous 2.40 (HH) 0.5 - 1.9 mmol/L   Comment NOTIFIED PHYSICIAN    Dg Chest 2 View  Result Date: 04/15/2016 CLINICAL DATA:  Pneumonia, EXAM: CHEST  2 VIEW COMPARISON:  Chest radiograph 12/01/2015 FINDINGS: Normal cardiac silhouette. There is subtle RIGHT infrahilar density over the cardiophrenic angle. No pneumothorax. No pleural fluid. IMPRESSION: Potential RIGHT lower lobe pneumonia. Atelectasis could have a similar pattern. Electronically Signed   By: Suzy Bouchard M.D.   On: 04/15/2016 17:15     EKG  EKG Interpretation None  Radiology Dg Chest 2 View  Result Date: 04/15/2016 CLINICAL DATA:  Pneumonia, EXAM: CHEST  2 VIEW COMPARISON:  Chest radiograph 12/01/2015 FINDINGS: Normal cardiac silhouette. There is subtle RIGHT infrahilar density over the cardiophrenic angle. No pneumothorax. No  pleural fluid. IMPRESSION: Potential RIGHT lower lobe pneumonia. Atelectasis could have a similar pattern. Electronically Signed   By: Suzy Bouchard M.D.   On: 04/15/2016 17:15    Procedures Procedures (including critical care time)  Medications Ordered in ED Medications  ondansetron (ZOFRAN-ODT) 4 MG disintegrating tablet (not administered)  acetaminophen (TYLENOL) 325 MG tablet (not administered)  sodium chloride 0.9 % bolus 1,000 mL (not administered)  ondansetron (ZOFRAN) injection 4 mg (not administered)  cefTRIAXone (ROCEPHIN) 1 g in dextrose 5 % 50 mL IVPB (not administered)  azithromycin (ZITHROMAX) 500 mg in dextrose 5 % 250 mL IVPB (not administered)  acetaminophen (TYLENOL) tablet 650 mg (650 mg Oral Given 04/15/16 1700)  ondansetron (ZOFRAN-ODT) disintegrating tablet 4 mg (4 mg Oral Given 04/15/16 1644)     Initial Impression / Assessment and Plan / ED Course  I have reviewed the triage vital signs and the nursing notes.  Pertinent labs & imaging results that were available during my care of the patient were reviewed by me and considered in my medical decision making (see chart for details).  Iv ns bolus.   cxr c/w pna. Rocephin and zithromax iv.   Pt reports positive flu test, will also give rx tamiflu.   Reviewed nursing notes and prior charts for additional history.   Initial lactate normal, and initial plan for ivf in ed and outpt tx.  On recheck, pt with recurrent nv, not bloody or bilious. Not tolerating po despite ivf, zofran.  Recheck abd soft nt.   Given recurrent nv, flu with pna, increasing lactate, will admit.   [facility] called as unassigned - they indicates as admission within last 30 days, to call Hospitalists to admit.   Hospitalists paged.  Final Clinical Impressions(s) / ED Diagnoses   Final diagnoses:  None    New Prescriptions New Prescriptions   No medications on file     Lajean Saver, MD 04/15/16 2057

## 2016-04-15 NOTE — H&P (Signed)
History and Physical    Aloria Looper QJJ:[id number] DOB: 03-Mar-1970 DOA: 04/15/2016  Referring MD/NP/PA: Ashok Cordia PCP: Daphene Jaeger, PA-C  Patient coming from: Urgent care  Chief Complaint: Cough  HPI: Andrea Wade is a 46 y.o. female with medical history significant of DM type 2, chronic pain, and tobacco abuse; who presents with complaints of productive cough cough 2 days. Cough is productive of yellowish sputum. Associated symptoms include headache, throat discomfort, wheezing, fever up to 101.59F at home, chills, body aches, intermittent headaches, nausea, vomiting, and diarrhea. Patient has been unable to keep any food or liquids down. Denies having any focal weakness, change in vision, chest pain, hematemesis, or dysuria. Patient went to urgent care for further evaluation today notes being diagnosed with the flu and told that she had pneumonia for which she was urged to come to the emergency department for further care. Patient was just recently hospitalized from 1/27- 1/34 pyelonephritis which patient reports completing a 14 day course of oral ciprofloxacin. Patient reports quitting smoking 2 days ago. Prior notes history smoked one pack of cigarettes per day on average for at least 20 years. She does not require oxygen at home.   ED Course: Upon admission into the emergency department patient was seen to be febrile to 103F, pulse up to 122, respiration up to 22, systolic blood pressure as low as 80s, O2 saturations as low as 87%. Patient was placed on 2 L of nasal cannula oxygen with improvement of O2 saturation. Chest x-ray showed a right lower lobe infiltrate. She started on Tamiflu and given initial antibiotics of ceftriaxone and azithromycin.  Review of Systems: As per HPI otherwise 10 point review of systems negative.   Past Medical History:  Diagnosis Date  . Anemia   . Anxiety   . Arthritis    "joints" (04/17/2013)  . Chronic back pain    "all over" (04/17/2013)  . Crohn  disease ([place])   . Daily headache   . Depression   . Diabetes type 2, uncontrolled ([place])   . 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 GERD (gastroesophageal reflux disease)   . Migraine    "q day" (04/17/2013)  . MS (multiple sclerosis) ([place])   . Pneumonia    "about 10 times in my lifetime" (04/17/2013)    Past Surgical History:  Procedure Laterality Date  . CARDIAC CATHETERIZATION N/A 10/08/2015   Procedure: Left Heart Cath and Coronary Angiography;  Surgeon: Peter M Martinique, MD;  Location: [facility];  Service: Cardiovascular;  Laterality: N/A;  . CHOLECYSTECTOMY  1994  . TUBAL LIGATION  1993     reports that she has been smoking Cigarettes.  She has a 10.50 pack-year smoking history. She has never used smokeless tobacco. She reports that she does not drink alcohol or use drugs.  Allergies  Allergen Reactions  . Codeine Hives, Itching and Palpitations  . Peanut-Containing Drug Products Anaphylaxis  . Tape Other (See Comments)    Slight irritation  . Aspirin Itching    High doses only-baby is ok  . Penicillins Hives, Itching and Nausea And Vomiting    Allergy from childhood Has patient had a PCN reaction causing immediate rash, facial/tongue/throat swelling, SOB or lightheadedness with hypotension: Yes Has patient had a PCN reaction causing severe rash involving mucus membranes or skin necrosis: Unk Has patient had a PCN reaction that required hospitalization: Unk Has patient had a PCN reaction occurring within the last 10 years:  No If all of the above answers are "NO", then may proceed with Cephalosporin use.      Family History  Problem Relation Age of Onset  . Diabetes Mother   . Heart disease Mother   . Stroke Mother   . Heart failure Mother   . Heart disease Father   . Stroke Father   . Heart failure Father   . Heart attack Brother   . Healthy Brother     Prior to Admission medications   Medication Sig Start Date End Date  Taking? Authorizing Provider  baclofen (LIORESAL) 20 MG tablet Take 20 mg by mouth 4 (four) times daily.     Historical Provider, MD  Canagliflozin-Metformin HCl (INVOKAMET) [phone] MG TABS Take 1 tablet by mouth 2 (two) times daily.    Historical Provider, MD  ciprofloxacin (CIPRO) 500 MG tablet Take 1 tablet (500 mg total) by mouth 2 (two) times daily. 03/31/16   Oswald Hillock, MD  DULoxetine (CYMBALTA) 60 MG capsule Take 60 mg by mouth daily.    Historical Provider, MD  Eluxadoline (VIBERZI) 75 MG TABS Take 75 mg by mouth 2 (two) times daily.    Historical Provider, MD  Eslicarbazepine Acetate (APTIOM) 800 MG TABS Take 1,600 mg by mouth at bedtime.    Historical Provider, MD  Gabapentin Enacarbil (HORIZANT) 600 MG TBCR Take 600 mg by mouth 2 (two) times daily.     Historical Provider, MD  insulin aspart (NOVOLOG FLEXPEN) 100 UNIT/ML FlexPen Inject 14-28 Units into the skin 3 (three) times daily with meals. SLIDING SCALE    Historical Provider, MD  insulin detemir (LEVEMIR) 100 unit/ml SOLN Inject 40 Units into the skin at bedtime.    Historical Provider, MD  lisinopril (PRINIVIL,ZESTRIL) 5 MG tablet Take 1 tablet (5 mg total) by mouth daily. 10/08/15   Ripudeep Krystal Eaton, MD  nortriptyline (PAMELOR) 25 MG capsule Take 50 mg by mouth at bedtime.     Historical Provider, MD  pantoprazole (PROTONIX) 40 MG tablet Take 1 tablet (40 mg total) by mouth daily. 11/16/13   Lorayne Marek, MD  promethazine (PHENERGAN) 12.5 MG tablet Take 12.5 mg by mouth 2 (two) times daily.    Historical Provider, MD  tapentadol (NUCYNTA) 50 MG tablet Take 50 mg by mouth every 6 (six) hours.    Historical Provider, MD  Tapentadol HCl 150 MG TB12 Take 150 mg by mouth 2 (two) times daily.    Historical Provider, MD    Physical Exam:   Constitutional: Sick appearing female who is able to follow commands Vitals:   04/15/16 1930 04/15/16 2000 04/15/16 2030 04/15/16 2102  BP: 110/64 113/68 97/56 (!) 98/48  Pulse: 96 92 98 96    Resp: 19 16 17 20   Temp:      TempSrc:      SpO2: 93% 96% (!) 88% (!) 87%  Weight:      Height:       Eyes: PERRL, lids and conjunctivae normal ENMT: Mucous membranes are dry Posterior pharynx clear of any exudate or lesions. Neck: normal, supple, no masses, no thyromegaly Respiratory: Decreased overall aeration noted on the right lung base where there is also note of rales. Normal respiratory effort. No accessory muscle use.  Cardiovascular: Tachycardic, no murmurs / rubs / gallops. No extremity edema. 2+ pedal pulses. No carotid bruits.  Abdomen: Mild lower abdominal tenderness, no masses palpated. No hepatosplenomegaly. Bowel sounds positive. Mild CVA tenderness still present on right. Musculoskeletal: no clubbing / cyanosis.  No joint deformity upper and lower extremities. Good ROM, no contractures. Normal muscle tone.  Skin: no rashes, lesions, ulcers. No induration Neurologic: CN 2-12 grossly intact. Sensation intact, DTR normal. Strength 5/5 in all 4.  Psychiatric: Normal judgment and insight. Alert and oriented x 3. Normal mood.     Labs on Admission: I have personally reviewed following labs and imaging studies  CBC:  Recent Labs Lab 04/15/16 1643  WBC 7.6  NEUTROABS 5.7  HGB 10.5*  HCT 35.2*  MCV 63.9*  PLT 696*   Basic Metabolic Panel:  Recent Labs Lab 04/15/16 1643  NA 131*  K 4.1  CL 94*  CO2 27  GLUCOSE 205*  BUN <5*  CREATININE 0.64  CALCIUM 9.0   GFR: Estimated Creatinine Clearance: 117.5 mL/min (by C-G formula based on SCr of 0.64 mg/dL). Liver Function Tests:  Recent Labs Lab 04/15/16 1643  AST 33  ALT 24  ALKPHOS 82  BILITOT 0.4  PROT 8.3*  ALBUMIN 3.4*   No results for input(s): LIPASE, AMYLASE in the last 168 hours. No results for input(s): AMMONIA in the last 168 hours. Coagulation Profile: No results for input(s): INR, PROTIME in the last 168 hours. Cardiac Enzymes: No results for input(s): CKTOTAL, CKMB, CKMBINDEX, TROPONINI  in the last 168 hours. BNP (last 3 results) No results for input(s): PROBNP in the last 8760 hours. HbA1C: No results for input(s): HGBA1C in the last 72 hours. CBG: No results for input(s): GLUCAP in the last 168 hours. Lipid Profile: No results for input(s): CHOL, HDL, LDLCALC, TRIG, CHOLHDL, LDLDIRECT in the last 72 hours. Thyroid Function Tests: No results for input(s): TSH, T4TOTAL, FREET4, T3FREE, THYROIDAB in the last 72 hours. Anemia Panel: No results for input(s): VITAMINB12, FOLATE, FERRITIN, TIBC, IRON, RETICCTPCT in the last 72 hours. Urine analysis:    Component Value Date/Time   COLORURINE YELLOW 03/28/2016 2225   APPEARANCEUR HAZY (A) 03/28/2016 2225   LABSPEC 1.020 03/28/2016 2225   PHURINE 7.0 03/28/2016 2225   GLUCOSEU >=500 (A) 03/28/2016 2225   HGBUR NEGATIVE 03/28/2016 2225   BILIRUBINUR NEGATIVE 03/28/2016 2225   KETONESUR 20 (A) 03/28/2016 2225   PROTEINUR 100 (A) 03/28/2016 2225   UROBILINOGEN 0.2 07/22/2013 1703   NITRITE NEGATIVE 03/28/2016 2225   LEUKOCYTESUR MODERATE (A) 03/28/2016 2225   Sepsis Labs: No results found for this or any previous visit (from the past 240 hour(s)).   Radiological Exams on Admission: Dg Chest 2 View  Result Date: 04/15/2016 CLINICAL DATA:  Pneumonia, EXAM: CHEST  2 VIEW COMPARISON:  Chest radiograph 12/01/2015 FINDINGS: Normal cardiac silhouette. There is subtle RIGHT infrahilar density over the cardiophrenic angle. No pneumothorax. No pleural fluid. IMPRESSION: Potential RIGHT lower lobe pneumonia. Atelectasis could have a similar pattern. Electronically Signed   By: Suzy Bouchard M.D.   On: 04/15/2016 17:15    EKG: Independently reviewed. ordered  Assessment/Plan Sepsis secondary to influenza/healthcare associated pneumonia: Acute.Patient presents with tachycardia, tachypnea, leukocytosis, and lactic acidosis. Source known prior to arrival to be influenza and pneumonia. Sepsis protocol initiated, and patient was  initially started on ceftriaxone and azithromycin. - Admit to stepdown bed - Sepsis protocol initiated  - Follow up blood, urine, and sputum cultures - Chronic antibiotic coverage to vancomycin and cefepime given recent hospitalization. Patient just completed a course of ciprofloxacin. - Continue Tamiflu  - Trend lactic acid level    Hypotension: Systolic blood pressures noted to be as low as the 80s. Blood pressures temporarily improved with IV fluids. -  Held lisinopril 2/2 hypotension - Bolus 1 L IV fluids, then start rate of NS at 148m/hr   Acute respiratory failure with hypoxia: Initial O2 saturations noted to be stable was 87% on room air. O2 saturations improved with 2 L of nasal cannula oxygen. - Continuous pulse oximetry overnight with nasal cannula oxygen to keep O2 sats greater than 92% - Duonebs prn sob/wheezing  UTI(POA)/history of pyelonephritis: Question of continuation of previous hospitalization infection. Patient reports taking ciprofloxacin. A repeat CT scan of the abdomen is recommended in 4 weeks to check for resolution of pyelonephritis. - Recheck urine culture - empirically covered with antibiotics as seen above  Nausea and vomiting - Zofran/Phenergan prn N/V  Diabetes mellitus type 2: Last hemoglobin A1c noted was 7.6 and 10/2015.  - Hypoglycemic protocols - check hemoglobin A1c - Held home insulin regimen until tolerating by mouth - CBGs every 4 hours with moderate sliding scale insulin. - Adjust regimen as needed when patient tolerating by mouth   Chronic pain - Continue Nucynta, baclofen, amitriptyline   Anemia: Hemoglobin was noted to be 10.5 on admission. Patient's baseline hemoglobin is somewhere around 9. - Recheck CBC in a.m.  Tobacco abuse: Patient reports quitting tobacco 2 days. declines need of a nicotine patch will hospitalized. - counseled on the need of cessation of tobacco   DVT prophylaxis: Lovenox Code Status: Full  Family  Communication: No family present at bedside   Disposition Plan: Likely discharge home once medically stable Consults called: None Admission status: Inpatient  RNorval MortonMD [facility] Pager [phone] If 7PM-7AM, please contact night-coverage [email or web address] Password [facility] 04/15/2016, 9:41 PM

## 2016-04-16 DIAGNOSIS — I959 Hypotension, unspecified: Secondary | ICD-10-CM

## 2016-04-16 DIAGNOSIS — J189 Pneumonia, unspecified organism: Secondary | ICD-10-CM

## 2016-04-16 DIAGNOSIS — Z794 Long term (current) use of insulin: Secondary | ICD-10-CM

## 2016-04-16 DIAGNOSIS — J111 Influenza due to unidentified influenza virus with other respiratory manifestations: Secondary | ICD-10-CM | POA: Diagnosis present

## 2016-04-16 DIAGNOSIS — J9600 Acute respiratory failure, unspecified whether with hypoxia or hypercapnia: Secondary | ICD-10-CM | POA: Diagnosis present

## 2016-04-16 DIAGNOSIS — J9601 Acute respiratory failure with hypoxia: Secondary | ICD-10-CM

## 2016-04-16 DIAGNOSIS — G894 Chronic pain syndrome: Secondary | ICD-10-CM

## 2016-04-16 DIAGNOSIS — A419 Sepsis, unspecified organism: Secondary | ICD-10-CM

## 2016-04-16 DIAGNOSIS — E118 Type 2 diabetes mellitus with unspecified complications: Secondary | ICD-10-CM

## 2016-04-16 LAB — URINALYSIS, ROUTINE W REFLEX MICROSCOPIC
Bilirubin Urine: NEGATIVE
Ketones, ur: NEGATIVE mg/dL
NITRITE: NEGATIVE
PROTEIN: 30 mg/dL — AB
SPECIFIC GRAVITY, URINE: 1.021 (ref 1.005–1.030)
pH: 5 (ref 5.0–8.0)

## 2016-04-16 LAB — INFLUENZA PANEL BY PCR (TYPE A & B)
INFLAPCR: NEGATIVE
Influenza B By PCR: POSITIVE — AB

## 2016-04-16 LAB — BASIC METABOLIC PANEL
ANION GAP: 10 (ref 5–15)
BUN: 5 mg/dL — AB (ref 6–20)
CHLORIDE: 102 mmol/L (ref 101–111)
CO2: 23 mmol/L (ref 22–32)
Calcium: 7.8 mg/dL — ABNORMAL LOW (ref 8.9–10.3)
Creatinine, Ser: 0.52 mg/dL (ref 0.44–1.00)
GFR calc non Af Amer: >60 mL/min (ref >60)
Glucose, Bld: 122 mg/dL — ABNORMAL HIGH (ref 65–99)
POTASSIUM: 3.7 mmol/L (ref 3.5–5.1)
SODIUM: 135 mmol/L (ref 135–145)

## 2016-04-16 LAB — GLUCOSE, CAPILLARY
GLUCOSE-CAPILLARY: 109 mg/dL — AB (ref 65–99)
GLUCOSE-CAPILLARY: 104 mg/dL — AB (ref 65–99)
GLUCOSE-CAPILLARY: 109 mg/dL — AB (ref 65–99)
Glucose-Capillary: 108 mg/dL — ABNORMAL HIGH (ref 65–99)

## 2016-04-16 LAB — CBC
HCT: 30.8 % — ABNORMAL LOW (ref 36.0–46.0)
HEMOGLOBIN: 9.1 g/dL — AB (ref 12.0–15.0)
MCH: 19 pg — AB (ref 26.0–34.0)
MCHC: 29.5 g/dL — ABNORMAL LOW (ref 30.0–36.0)
MCV: 64.2 fL — ABNORMAL LOW (ref 78.0–100.0)
Platelets: 345 10*3/uL (ref 150–400)
RBC: 4.8 MIL/uL (ref 3.87–5.11)
RDW: 20.2 % — ABNORMAL HIGH (ref 11.5–15.5)
WBC: 6.9 10*3/uL (ref 4.0–10.5)

## 2016-04-16 LAB — PROCALCITONIN: Procalcitonin: <0.1 ng/mL

## 2016-04-16 LAB — CBG MONITORING, ED
GLUCOSE-CAPILLARY: 174 mg/dL — AB (ref 65–99)
GLUCOSE-CAPILLARY: 117 mg/dL — AB (ref 65–99)

## 2016-04-16 LAB — STREP PNEUMONIAE URINARY ANTIGEN: STREP PNEUMO URINARY ANTIGEN: NEGATIVE

## 2016-04-16 LAB — LACTIC ACID, PLASMA
LACTIC ACID, VENOUS: 1 mmol/L (ref 0.5–1.9)
Lactic Acid, Venous: 1.1 mmol/L (ref 0.5–1.9)

## 2016-04-16 LAB — MRSA PCR SCREENING: MRSA BY PCR: NEGATIVE

## 2016-04-16 MED ORDER — BENZONATATE 100 MG PO CAPS
100.0000 mg | ORAL_CAPSULE | Freq: Three times a day (TID) | ORAL | Status: DC
  Administered 2016-04-16 – 2016-04-20 (×13): 100 mg via ORAL
  Filled 2016-04-16 (×13): qty 1

## 2016-04-16 MED ORDER — ELUXADOLINE 75 MG PO TABS
75.0000 mg | ORAL_TABLET | Freq: Two times a day (BID) | ORAL | Status: DC
  Administered 2016-04-17 – 2016-04-19 (×5): 75 mg via ORAL
  Filled 2016-04-16 (×8): qty 1

## 2016-04-16 MED ORDER — ESLICARBAZEPINE ACETATE 800 MG PO TABS
1600.0000 mg | ORAL_TABLET | Freq: Every day | ORAL | Status: DC
  Administered 2016-04-17 – 2016-04-19 (×3): 1600 mg via ORAL
  Filled 2016-04-16 (×5): qty 2

## 2016-04-16 MED ORDER — GUAIFENESIN-DM 100-10 MG/5ML PO SYRP
5.0000 mL | ORAL_SOLUTION | ORAL | Status: DC | PRN
  Administered 2016-04-17 (×2): 5 mL via ORAL
  Filled 2016-04-16 (×2): qty 5

## 2016-04-16 MED ORDER — GABAPENTIN ENACARBIL ER 600 MG PO TBCR
600.0000 mg | EXTENDED_RELEASE_TABLET | Freq: Two times a day (BID) | ORAL | Status: DC
  Administered 2016-04-17 – 2016-04-20 (×7): 600 mg via ORAL
  Filled 2016-04-16 (×11): qty 1

## 2016-04-16 MED ORDER — ALBUTEROL SULFATE (2.5 MG/3ML) 0.083% IN NEBU
2.5000 mg | INHALATION_SOLUTION | Freq: Four times a day (QID) | RESPIRATORY_TRACT | Status: DC
  Administered 2016-04-16 – 2016-04-19 (×11): 2.5 mg via RESPIRATORY_TRACT
  Filled 2016-04-16 (×12): qty 3

## 2016-04-16 MED ORDER — HYDROMORPHONE HCL 2 MG PO TABS
4.0000 mg | ORAL_TABLET | Freq: Three times a day (TID) | ORAL | Status: DC | PRN
  Administered 2016-04-16 – 2016-04-20 (×9): 4 mg via ORAL
  Filled 2016-04-16 (×9): qty 2

## 2016-04-16 MED ORDER — HYDROMORPHONE HCL ER 8 MG PO T24A
8.0000 mg | EXTENDED_RELEASE_TABLET | ORAL | Status: DC
  Administered 2016-04-16 – 2016-04-19 (×4): 8 mg via ORAL
  Filled 2016-04-16 (×6): qty 1

## 2016-04-16 NOTE — Progress Notes (Signed)
Pharmacy note: reconcile home pain medications  46 yo female here with PNA. Pharmacy was asked to clarify her home pain regimen. Her home medication list has been completed and for pain she is taking: -hydromorphone ER 46m po daily -hydromorphone IR 429mpo q8PRN breakthrough pain  Pharmacy carries the hydromorphone 63m4mablets  Plan -Spoke with Dr. RaiTana Coastill begin hydromorphone ER 63mg34m daily with her PRN regimen -I spoke with the patient and she will bring in her home hydromorphone if the 8ng does not provide enough relief.  -Could also consider changing the PRN dilaudid regimen to 4mg [id number] is needed  Thank you for asking pharmacy to be involved in the care of this patient. AndreHildred Laserrm D 04/16/2016 11:07 AM       Medications:  Prescriptions Prior to Admission  Medication Sig Dispense Refill Last Dose  . baclofen (LIORESAL) 10 MG tablet Take 20 mg by mouth every 6 (six) hours.   04/15/2016 at unknown  . Canagliflozin-Metformin HCl (INVOKAMET) [phone] MG TABS Take 1 tablet by mouth 2 (two) times daily.   04/15/2016 at Unknown time  . Eluxadoline (VIBERZI) 75 MG TABS Take 75 mg by mouth 2 (two) times daily.   04/15/2016 at Unknown time  . Gabapentin Enacarbil (HORIZANT) 600 MG TBCR Take 600 mg by mouth 2 (two) times daily.    Past Week at Unknown time  . HYDROmorphone (DILAUDID) 8 MG tablet Take 8 mg by mouth every 8 (eight) hours as needed (breakthrough pain).   04/15/2016 at unknown  . HYDROmorphone HCl (EXALGO) 12 MG T24A SR tablet Take 12 mg by mouth daily.   04/14/2016 at unknown  . lisinopril (PRINIVIL,ZESTRIL) 5 MG tablet Take 1 tablet (5 mg total) by mouth daily. 30 tablet 3 04/15/2016 at Unknown time  . nortriptyline (PAMELOR) 25 MG capsule Take 50 mg by mouth at bedtime.    Past Week at Unknown time  . pantoprazole (PROTONIX) 40 MG tablet Take 1 tablet (40 mg total) by mouth daily. 30 tablet 2 Past Week at Unknown time  . promethazine (PHENERGAN) 12.5 MG tablet Take  12.5 mg by mouth 2 (two) times daily.   04/15/2016 at Unknown time  . DULoxetine (CYMBALTA) 60 MG capsule Take 60 mg by mouth daily.   04/15/2016  . Eslicarbazepine Acetate (APTIOM) 800 MG TABS Take 1,600 mg by mouth at bedtime.   04/14/2016 at unknown  . insulin aspart (NOVOLOG FLEXPEN) 100 UNIT/ML FlexPen Inject 16-24 Units into the skin 3 (three) times daily with meals. SLIDING SCALE   04/14/2016 at unknown  . insulin detemir (LEVEMIR) 100 unit/ml SOLN Inject 40 Units into the skin at bedtime.   04/14/2016 at unknown

## 2016-04-16 NOTE — Care Management Note (Signed)
Case Management Note  Patient Details  Name: Andrea Wade MRN: [id number] Date of Birth: [birth year]  Subjective/Objective:     Adm w sepsis               Action/Plan:lives at home, pcp dr Theodoro Grist   Expected Discharge Date:                  Expected Discharge Plan:  [name]  In-House Referral:     Discharge planning Services  CM Consult  Post Acute Care Choice:    Choice offered to:     DME Arranged:    DME Agency:     HH Arranged:    [place] Agency:     Status of Service:  In process, will continue to follow  If discussed at Long Length of Stay Meetings, dates discussed:    Additional Comments: will moniter for dc needs as pt progresses.  Lacretia Leigh, RN 04/16/2016, 10:50 AM

## 2016-04-16 NOTE — Progress Notes (Signed)
[facility]                                                                              Patient Demographics  Andrea Wade, is a 46 y.o. female, DOB - 10-07-1970, ZDG:[id number]  Admit date - 04/15/2016   Admitting Physician Norval Morton, MD  Outpatient Primary MD for the patient is Daphene Jaeger, PA-C  Outpatient specialists:   LOS - 1  days    Chief Complaint  Patient presents with  . Influenza  . Pneumonia  . Emesis  . Diarrhea       Brief summary   The patient is a 46 year old female with diabetes, chronic pain syndrome, tobacco abuse presented with a productive cough for last 2 days with yellowish sputum. Patient also reported headache, sore throat, wheezing, fever up to 101F, chills, body aches, nausea vomiting and diarrhea.Patient was just recently hospitalized from 1/27- 1/34 pyelonephritis which patient reports completing a 14 day course of oral ciprofloxacin. In ED, patient was febrile up to 595G, hr 387, systolic BP in 56E, O2 sats 87%. Chest x-ray showed right lower lobe infiltrates. She was started on Tamiflu and antibiotics.   Assessment & Plan    Principal Problem: Influenza/healthcare associated pneumonia: - Patient met SIRS criteria secondary to hypotension, tachycardia, hypoxia, source pneumonia, influenza - Patient was sent here from urgent care at [facility] and was told she had pneumonia and positive flu test - Continue IV vancomycin, IV cefepime,  Tamiflu.  - Continue scheduled nebs, O2 supplement - Urine strep antigen negative,    Hypotension: Systolic blood pressures noted to be as low as the 80s - Likely due to #1, hold antihypertensives, continue gentle hydration    Acute respiratory failure with hypoxia: Initial O2 saturations noted to be stable was 87% on room air. -  Continue scheduled nebs, wean O2 as tolerated  UTI/history of pyelonephritis: Question of continuation of previous hospitalization infection. Patient  reports taking ciprofloxacin. A repeat CT scan of the abdomen is recommended in 4 weeks to check for resolution of pyelonephritis. - Urine culture still positive, continue IV vancomycin and cefepime, follow urine cultures  Diabetes mellitus type 2: Last hemoglobin A1c noted was 7.6 and 10/2015.  -Continue sliding scale insulin while inpatient  Chronic pain - Continue baclofen, amitriptyline, Dilaudid per her home schedule (discussed with pharmacy)  Chronic Anemia: Hemoglobin was noted to be 10.5 on admission. Patient's baseline hemoglobin is somewhere around 9. - Hemoglobin currently at baseline  Tobacco abuse: Patient reports quitting tobacco 2 days. declines need of a nicotine patch will hospitalized. - counseled on the need of cessation of tobacco    Code Status: full  DVT Prophylaxis:  Lovenox  Family Communication: Discussed in detail with the patient, all imaging results, lab results explained to the patient    Disposition Plan:  Time Spent in minutes   25 minutes  Procedures:    Consultants:    Antimicrobials:   IV vancomycin  IV cefepime   Medications  Scheduled Meds: . baclofen  20 mg Oral QID  . benzonatate  100 mg Oral TID  . ceFEPime (MAXIPIME) IV  1 g  Intravenous Q8H  . DULoxetine  60 mg Oral Daily  . [START ON 04/17/2016] Eluxadoline  75 mg Oral BID  . enoxaparin (LOVENOX) injection  55 mg Subcutaneous Q24H  . [START ON 05/29/760] Eslicarbazepine Acetate  1,600 mg Oral QHS  . [START ON 04/17/2016] Gabapentin Enacarbil  600 mg Oral BID  . guaiFENesin  600 mg Oral BID  . HYDROmorphone HCl  8 mg Oral Q24H  . insulin aspart  0-15 Units Subcutaneous Q4H  . nortriptyline  50 mg Oral QHS  . oseltamivir  75 mg Oral BID  . pantoprazole  40 mg Oral Daily  . vancomycin  1,500 mg Intravenous Q12H   Continuous Infusions: . sodium chloride 100 mL/hr at 04/16/16 0648   PRN Meds:.acetaminophen **OR** acetaminophen, albuterol, guaiFENesin-dextromethorphan,  HYDROmorphone, ipratropium, ondansetron **OR** ondansetron (ZOFRAN) IV, promethazine   Antibiotics   Anti-infectives    Start     Dose/Rate Route Frequency Ordered Stop   04/16/16 1000  oseltamivir (TAMIFLU) capsule 75 mg     75 mg Oral 2 times daily 04/15/16 2353 04/20/16 2159   04/16/16 0900  vancomycin (VANCOCIN) 1,500 mg in sodium chloride 0.9 % 500 mL IVPB     1,500 mg 250 mL/hr over 120 Minutes Intravenous Every 12 hours 04/15/16 2237     04/15/16 2330  oseltamivir (TAMIFLU) capsule 75 mg  Status:  Discontinued     75 mg Oral 2 times daily 04/15/16 2329 04/15/16 2352   04/15/16 2300  ceFEPIme (MAXIPIME) 1 g in dextrose 5 % 50 mL IVPB     1 g 100 mL/hr over 30 Minutes Intravenous Every 8 hours 04/15/16 2232     04/15/16 2245  vancomycin (VANCOCIN) 1,500 mg in sodium chloride 0.9 % 500 mL IVPB     1,500 mg 250 mL/hr over 120 Minutes Intravenous  Once 04/15/16 2232 04/16/16 0228   04/15/16 2000  oseltamivir (TAMIFLU) capsule 75 mg     75 mg Oral  Once 04/15/16 1950 04/15/16 2000   04/15/16 1900  cefTRIAXone (ROCEPHIN) 1 g in dextrose 5 % 50 mL IVPB     1 g 100 mL/hr over 30 Minutes Intravenous  Once 04/15/16 1857 04/15/16 2055   04/15/16 1900  azithromycin (ZITHROMAX) 500 mg in dextrose 5 % 250 mL IVPB     500 mg 250 mL/hr over 60 Minutes Intravenous  Once 04/15/16 1857 04/15/16 2055        Subjective:   Andrea Wade was seen and examined today. Feeling miserable, weak, coughing, bringing up phlegm. Nauseous still BP borderline low. Patient denies dizziness, shortness of breath, D/C, new weakness, numbess, tingling. No acute events overnight.    Objective:   Vitals:   04/16/16 0500 04/16/16 0530 04/16/16 0700 04/16/16 0800  BP: 120/70 108/61 105/69 107/64  Pulse: 79 83 81 80  Resp: 19  19 (!) 25  Temp:  98.6 F (37 C) 98.6 F (37 C)   TempSrc:  Oral Oral   SpO2: 96% 95% 92% 93%  Weight:  116.7 kg (257 lb 4.4 oz)    Height:  _0  (1.753 m)      Intake/Output  Summary (Last 24 hours) at 04/16/16 1202 Last data filed at 04/16/16 1100  Gross per 24 hour  Intake          6016.67 ml  Output              350 ml  Net          5666.67 ml  Wt Readings from Last 3 Encounters:  04/16/16 116.7 kg (257 lb 4.4 oz)  03/31/16 112.8 kg (248 lb 11.2 oz)  11/19/15 108.9 kg (240 lb)     Exam  General: Alert and oriented x 3, NAD, Miserable  HEENT:  Neck: Supple, no JVD  Cardiovascular: S1 S2 auscultated, no rubs, murmurs or gallops. Regular rate and rhythm.  Respiratory: Decreased breath sounds at the bases, with scattered rhonchi  Gastrointestinal: Soft, nontender, nondistended, + bowel sounds  Ext: no cyanosis clubbing or edema  Neuro: AAOx3, Cr N's II- XII. Strength 5/5 upper and lower extremities bilaterally  Skin: No rashes  Psych: Normal affect and demeanor, alert and oriented x3    Data Reviewed:  I have personally reviewed following labs and imaging studies  Micro Results Recent Results (from the past 240 hour(s))  MRSA PCR Screening     Status: None   Collection Time: 04/16/16  5:39 AM  Result Value Ref Range Status   MRSA by PCR NEGATIVE NEGATIVE Final    Comment:        The GeneXpert MRSA Assay (FDA approved for NASAL specimens only), is one component of a comprehensive MRSA colonization surveillance program. It is not intended to diagnose MRSA infection nor to guide or monitor treatment for MRSA infections.     Radiology Reports Dg Chest 2 View  Result Date: 04/15/2016 CLINICAL DATA:  Pneumonia, EXAM: CHEST  2 VIEW COMPARISON:  Chest radiograph 12/01/2015 FINDINGS: Normal cardiac silhouette. There is subtle RIGHT infrahilar density over the cardiophrenic angle. No pneumothorax. No pleural fluid. IMPRESSION: Potential RIGHT lower lobe pneumonia. Atelectasis could have a similar pattern. Electronically Signed   By: Suzy Bouchard M.D.   On: 04/15/2016 17:15   Ct Abdomen Pelvis W Contrast  Result Date:  03/29/2016 CLINICAL DATA:  Lower abdominal pain for 1 week EXAM: CT ABDOMEN AND PELVIS WITH CONTRAST TECHNIQUE: Multidetector CT imaging of the abdomen and pelvis was performed using the standard protocol following bolus administration of intravenous contrast. CONTRAST:  145m ISOVUE-300 IOPAMIDOL (ISOVUE-300) INJECTION 61% COMPARISON:  02/29/16 FINDINGS: Lower chest: No acute abnormality. Hepatobiliary: Liver is diffusely fatty infiltrated. The gallbladder has been surgically removed. Pancreas: Unremarkable. No pancreatic ductal dilatation or surrounding inflammatory changes. Spleen: Normal in size without focal abnormality. Adrenals/Urinary Tract: The adrenal glands and left kidney are within normal limits. Right kidney demonstrates some decreased enhancement and perinephric stranding. Delayed images demonstrate geographic large area of decreased attenuation and enhancement. These changes are consistent with pyelonephritis. These are similar but somewhat more marked than that seen on the prior exam. Mild fullness of the right collecting system is noted without definitive calculus. Bladder is well distended. Stomach/Bowel: Stomach is within normal limits. Appendix appears normal. No evidence of bowel wall thickening, distention, or inflammatory changes. Vascular/Lymphatic: No significant vascular findings are present. No enlarged abdominal or pelvic lymph nodes. Reproductive: Uterus and bilateral adnexa are unremarkable. Changes of tubal ligation are noted. Other: Small fat containing umbilical hernia is seen. No free pelvic fluid is noted. Musculoskeletal: Degenerative changes of lumbar spine are noted. IMPRESSION: Changes consistent with right pyelonephritis. The area of poor enhancement is increased in size when compared with the prior exam. Chronic changes as described above. Electronically Signed   By: MInez CatalinaM.D.   On: 03/29/2016 16:28    Lab Data:  CBC:  Recent Labs Lab 04/15/16 1643  04/16/16 0400  WBC 7.6 6.9  NEUTROABS 5.7  --   HGB 10.5* 9.1*  HCT 35.2* 30.8*  MCV 63.9* 64.2*  PLT 479* 747   Basic Metabolic Panel:  Recent Labs Lab 04/15/16 1643 04/16/16 0400  NA 131* 135  K 4.1 3.7  CL 94* 102  CO2 27 23  GLUCOSE 205* 122*  BUN <5* 5*  CREATININE 0.64 0.52  CALCIUM 9.0 7.8*   GFR: Estimated Creatinine Clearance: 119.9 mL/min (by C-G formula based on SCr of 0.52 mg/dL). Liver Function Tests:  Recent Labs Lab 04/15/16 1643  AST 33  ALT 24  ALKPHOS 82  BILITOT 0.4  PROT 8.3*  ALBUMIN 3.4*   No results for input(s): LIPASE, AMYLASE in the last 168 hours. No results for input(s): AMMONIA in the last 168 hours. Coagulation Profile: No results for input(s): INR, PROTIME in the last 168 hours. Cardiac Enzymes: No results for input(s): CKTOTAL, CKMB, CKMBINDEX, TROPONINI in the last 168 hours. BNP (last 3 results) No results for input(s): PROBNP in the last 8760 hours. HbA1C: No results for input(s): HGBA1C in the last 72 hours. CBG:  Recent Labs Lab 04/16/16 0117 04/16/16 0417 04/16/16 0756  GLUCAP 174* 117* 108*   Lipid Profile: No results for input(s): CHOL, HDL, LDLCALC, TRIG, CHOLHDL, LDLDIRECT in the last 72 hours. Thyroid Function Tests: No results for input(s): TSH, T4TOTAL, FREET4, T3FREE, THYROIDAB in the last 72 hours. Anemia Panel: No results for input(s): VITAMINB12, FOLATE, FERRITIN, TIBC, IRON, RETICCTPCT in the last 72 hours. Urine analysis:    Component Value Date/Time   COLORURINE YELLOW 04/16/2016 0205   APPEARANCEUR CLOUDY (A) 04/16/2016 0205   LABSPEC 1.021 04/16/2016 0205   PHURINE 5.0 04/16/2016 0205   GLUCOSEU >=500 (A) 04/16/2016 0205   HGBUR SMALL (A) 04/16/2016 0205   BILIRUBINUR NEGATIVE 04/16/2016 0205   KETONESUR NEGATIVE 04/16/2016 0205   PROTEINUR 30 (A) 04/16/2016 0205   UROBILINOGEN 0.2 07/22/2013 1703   NITRITE NEGATIVE 04/16/2016 0205   LEUKOCYTESUR MODERATE (A) 04/16/2016 0205      Andrea Wade M.D. [facility] 04/16/2016, 12:02 PM  Pager: [phone] Between 7am to 7pm - call Pager - [phone]  After 7pm go to [email or web address] - password [email or web address]  Call night coverage person covering after 7pm

## 2016-04-17 DIAGNOSIS — J111 Influenza due to unidentified influenza virus with other respiratory manifestations: Secondary | ICD-10-CM

## 2016-04-17 DIAGNOSIS — N12 Tubulo-interstitial nephritis, not specified as acute or chronic: Secondary | ICD-10-CM

## 2016-04-17 LAB — BASIC METABOLIC PANEL
ANION GAP: 9 (ref 5–15)
BUN: <5 mg/dL — ABNORMAL LOW (ref 6–20)
CALCIUM: 7.9 mg/dL — AB (ref 8.9–10.3)
CO2: 24 mmol/L (ref 22–32)
CREATININE: 0.51 mg/dL (ref 0.44–1.00)
Chloride: 103 mmol/L (ref 101–111)
Glucose, Bld: 107 mg/dL — ABNORMAL HIGH (ref 65–99)
Potassium: 3.3 mmol/L — ABNORMAL LOW (ref 3.5–5.1)
SODIUM: 136 mmol/L (ref 135–145)

## 2016-04-17 LAB — CBC
HEMATOCRIT: 29.9 % — AB (ref 36.0–46.0)
Hemoglobin: 8.7 g/dL — ABNORMAL LOW (ref 12.0–15.0)
MCH: 18.8 pg — ABNORMAL LOW (ref 26.0–34.0)
MCHC: 29.1 g/dL — AB (ref 30.0–36.0)
MCV: 64.6 fL — ABNORMAL LOW (ref 78.0–100.0)
PLATELETS: 286 10*3/uL (ref 150–400)
RBC: 4.63 MIL/uL (ref 3.87–5.11)
RDW: 20.2 % — AB (ref 11.5–15.5)
WBC: 4.6 10*3/uL (ref 4.0–10.5)

## 2016-04-17 LAB — GLUCOSE, CAPILLARY
GLUCOSE-CAPILLARY: 105 mg/dL — AB (ref 65–99)
GLUCOSE-CAPILLARY: 107 mg/dL — AB (ref 65–99)
GLUCOSE-CAPILLARY: 123 mg/dL — AB (ref 65–99)
Glucose-Capillary: 169 mg/dL — ABNORMAL HIGH (ref 65–99)
Glucose-Capillary: 165 mg/dL — ABNORMAL HIGH (ref 65–99)
Glucose-Capillary: 180 mg/dL — ABNORMAL HIGH (ref 65–99)
Glucose-Capillary: 208 mg/dL — ABNORMAL HIGH (ref 65–99)

## 2016-04-17 LAB — HEMOGLOBIN A1C
HEMOGLOBIN A1C: 7.4 % — AB (ref 4.8–5.6)
Mean Plasma Glucose: 166 mg/dL

## 2016-04-17 MED ORDER — POTASSIUM CHLORIDE CRYS ER 20 MEQ PO TBCR
40.0000 meq | EXTENDED_RELEASE_TABLET | Freq: Once | ORAL | Status: AC
  Administered 2016-04-17: 40 meq via ORAL
  Filled 2016-04-17 (×2): qty 2

## 2016-04-17 MED ORDER — INSULIN ASPART 100 UNIT/ML ~~LOC~~ SOLN
0.0000 [IU] | Freq: Every day | SUBCUTANEOUS | Status: DC
  Administered 2016-04-17: 2 [IU] via SUBCUTANEOUS

## 2016-04-17 MED ORDER — INSULIN ASPART 100 UNIT/ML ~~LOC~~ SOLN
0.0000 [IU] | Freq: Three times a day (TID) | SUBCUTANEOUS | Status: DC
  Administered 2016-04-18: 2 [IU] via SUBCUTANEOUS
  Administered 2016-04-18 – 2016-04-19 (×2): 1 [IU] via SUBCUTANEOUS
  Administered 2016-04-19: 2 [IU] via SUBCUTANEOUS
  Administered 2016-04-19: 1 [IU] via SUBCUTANEOUS
  Administered 2016-04-20: 2 [IU] via SUBCUTANEOUS

## 2016-04-17 NOTE — Evaluation (Signed)
Physical Therapy Evaluation Patient Details Name: Andrea Wade MRN: [id number] DOB: 1970-05-08 Today's Date: 04/17/2016   History of Present Illness  Pt is a 46 y/o female admitted secondary to cough, found to be septic and +Flu. PMH including but not limited to MS, Chron's disease, DM and tobacco use.  Clinical Impression  Pt presented supine in bed with HOB elevated, awake and willing to participate in therapy session. Prior to admission, pt reported that she was mod I with functional mobility with use of rollator to ambulate and occasionally required assistance for dressing and bathing. Pt currently requires min guard for bed mobility and min A for transfers with [facility]. Pt tolerated sitting EOB for approximately 10 minutes with supervision. All VSS throughout. Pt would continue to benefit from skilled physical therapy services at this time while admitted and after d/c to address her below listed limitations in order to improve her overall safety and independence with functional mobility.      Follow Up Recommendations Home health PT;Supervision/Assistance - 24 hour    Equipment Recommendations  None recommended by PT    Recommendations for Other Services       Precautions / Restrictions Precautions Precautions: Fall Precaution Comments: pt reported falling at least 5 times in the last 6 months Restrictions Weight Bearing Restrictions: No      Mobility  Bed Mobility Overal bed mobility: Needs Assistance Bed Mobility: Supine to Sit;Sit to Supine     Supine to sit: Min guard Sit to supine: Min guard   General bed mobility comments: increased time, use of bed rails and min guard for safety  Transfers Overall transfer level: Needs assistance Equipment used: 2 person hand held assist Transfers: Sit to/from Stand Sit to Stand: Min assist;From elevated surface         General transfer comment: pt required increased time, verbal and tactile cueing for hand placement and min  A to rise from elevated bed position  Ambulation/Gait             General Gait Details: pt deferred at this time stating that she was very tired and not feeling well. pt reported getting OOB earlier with assist to use BSC and sitting up in recliner to eat breakfast.  Stairs            Wheelchair Mobility    Modified Rankin (Stroke Patients Only)       Balance Overall balance assessment: Needs assistance;History of Falls Sitting-balance support: Feet supported Sitting balance-Leahy Scale: Fair     Standing balance support: During functional activity;Bilateral upper extremity supported Standing balance-Leahy Scale: Poor Standing balance comment: pt reliant on bilateral UEs on external supports                             Pertinent Vitals/Pain Pain Assessment: Faces Faces Pain Scale: Hurts little more Pain Location: generalized Pain Descriptors / Indicators: Sore;Aching Pain Intervention(s): Monitored during session;Limited activity within patient's tolerance    Home Living Family/patient expects to be discharged to:: Private residence Living Arrangements: Spouse/significant other;Parent Available Help at Discharge: Family;Available 24 hours/day Type of Home: House Home Access: Ramped entrance     Home Layout: One level Home Equipment: Shower seat;Toilet riser;Walker - 4 wheels;Wheelchair - Education officer, community - power      Prior Function Level of Independence: Needs assistance   Gait / Transfers Assistance Needed: pt reported that she ambulates with use of rollator  ADL's / Homemaking Assistance Needed: pt stated  that she occasionally needs assistance with bathing and dressing        Hand Dominance        Extremity/Trunk Assessment   Upper Extremity Assessment Upper Extremity Assessment: Generalized weakness    Lower Extremity Assessment Lower Extremity Assessment: Generalized weakness    Cervical / Trunk Assessment Cervical /  Trunk Assessment: Normal  Communication   Communication: No difficulties  Cognition Arousal/Alertness: Awake/alert Behavior During Therapy: WFL for tasks assessed/performed Overall Cognitive Status: Within Functional Limits for tasks assessed                      General Comments      Exercises     Assessment/Plan    PT Assessment Patient needs continued PT services  PT Problem List Decreased strength;Decreased activity tolerance;Decreased balance;Decreased mobility;Decreased coordination;Cardiopulmonary status limiting activity;Pain          PT Treatment Interventions DME instruction;Gait training;Therapeutic exercise;Stair training;Functional mobility training;Therapeutic activities;Balance training;Neuromuscular re-education;Patient/family education    PT Goals (Current goals can be found in the Care Plan section)  Acute Rehab PT Goals Patient Stated Goal: return home PT Goal Formulation: With patient Time For Goal Achievement: 05/01/16 Potential to Achieve Goals: Fair    Frequency Min 3X/week   Barriers to discharge        Co-evaluation               End of Session Equipment Utilized During Treatment: Gait belt;Oxygen Activity Tolerance: Patient limited by fatigue Patient left: in bed;with call bell/phone within reach Nurse Communication: Mobility status         Time: 4920-1007 PT Time Calculation (min) (ACUTE ONLY): 21 min   Charges:   PT Evaluation $PT Eval Moderate Complexity: 1 Procedure     PT G CodesClearnce Sorrel Sabirin Baray 04/17/2016, 10:23 AM Sherie Don, PT, DPT [phone]

## 2016-04-17 NOTE — Progress Notes (Signed)
[facility]                                                                              Patient Demographics  Andrea Wade, is a 46 y.o. female, DOB - 27-Nov-1970, JLL:[id number]  Admit date - 04/15/2016   Admitting Physician Norval Morton, MD  Outpatient Primary MD for the patient is Daphene Jaeger, PA-C  Outpatient specialists:   LOS - 2  days    Chief Complaint  Patient presents with  . Influenza  . Pneumonia  . Emesis  . Diarrhea       Brief summary   The patient is a 46 year old female with diabetes, chronic pain syndrome, tobacco abuse presented with a productive cough for last 2 days with yellowish sputum. Patient also reported headache, sore throat, wheezing, fever up to 101F, chills, body aches, nausea vomiting and diarrhea.Patient was just recently hospitalized from 1/27- 1/34 pyelonephritis which patient reports completing a 14 day course of oral ciprofloxacin. In ED, patient was febrile up to 158E, hr 825, systolic BP in 74V, O2 sats 87%. Chest x-ray showed right lower lobe infiltrates. She was started on Tamiflu and antibiotics.   Assessment & Plan    Principal Problem: Influenza/healthcare associated pneumonia: - Patient met SIRS criteria secondary to hypotension, tachycardia, hypoxia, source pneumonia, influenza - Patient was sent here from urgent care at [facility] and was told she had pneumonia and positive flu test - Continue IV vancomycin, IV cefepime,  Tamiflu.  - Continue scheduled nebs, O2 supplement - Urine strep antigen negative - Still feels very weak, although somewhat improving today    Hypotension: Systolic blood pressures noted to be as low as the 80s on admission - Likely due to #1, hold antihypertensives - BP now stable, DC IV fluids   Acute respiratory failure with hypoxia: Initial O2 saturations noted to be stable was 87% on room air. -  Continue scheduled nebs, wean O2 as tolerated  UTI/history of  pyelonephritis: Question of continuation of previous hospitalization infection. Patient reports taking ciprofloxacin. A repeat CT scan of the abdomen is recommended in 4 weeks to check for resolution of pyelonephritis. - Urine culture still positive, continue IV vancomycin and cefepime - Urine culture only 60,000 colonies of staph aureus  Diabetes mellitus type 2: Last hemoglobin A1c noted was 7.6 and 10/2015.  -Continue sliding scale insulin while inpatient  Chronic pain - Continue baclofen, amitriptyline, Dilaudid per her home schedule (discussed with pharmacy)  Chronic Anemia: Hemoglobin was noted to be 10.5 on admission. Patient's baseline hemoglobin is somewhere around 9. - Hemoglobin currently at baseline  Tobacco abuse: Patient reports quitting tobacco 2 days. declines need of a nicotine patch will hospitalized. - counseled on the need of cessation of tobacco   Hypokalemia Replaced   Code Status: full  DVT Prophylaxis:  Lovenox  Family Communication: Discussed in detail with the patient, all imaging results, lab results explained to the patient    Disposition Plan: Hopefully DC next 24-48 hours if improving Time Spent in minutes   25 minutes  Procedures:    Consultants:    Antimicrobials:   IV vancomycin  IV cefepime  Tamiflu   Medications  Scheduled Meds: . albuterol  2.5 mg Nebulization Q6H  . baclofen  20 mg Oral QID  . benzonatate  100 mg Oral TID  . ceFEPime (MAXIPIME) IV  1 g Intravenous Q8H  . DULoxetine  60 mg Oral Daily  . Eluxadoline  75 mg Oral BID  . enoxaparin (LOVENOX) injection  55 mg Subcutaneous Q24H  . Eslicarbazepine Acetate  1,600 mg Oral QHS  . Gabapentin Enacarbil  600 mg Oral BID  . guaiFENesin  600 mg Oral BID  . HYDROmorphone HCl  8 mg Oral Q24H  . insulin aspart  0-15 Units Subcutaneous Q4H  . nortriptyline  50 mg Oral QHS  . oseltamivir  75 mg Oral BID  . pantoprazole  40 mg Oral Daily  . vancomycin  1,500 mg  Intravenous Q12H   Continuous Infusions: . sodium chloride 100 mL/hr at 04/17/16 1122   PRN Meds:.acetaminophen **OR** acetaminophen, guaiFENesin-dextromethorphan, HYDROmorphone, ipratropium, ondansetron **OR** ondansetron (ZOFRAN) IV, promethazine   Antibiotics   Anti-infectives    Start     Dose/Rate Route Frequency Ordered Stop   04/16/16 1000  oseltamivir (TAMIFLU) capsule 75 mg     75 mg Oral 2 times daily 04/15/16 2353 04/20/16 2159   04/16/16 0900  vancomycin (VANCOCIN) 1,500 mg in sodium chloride 0.9 % 500 mL IVPB     1,500 mg 250 mL/hr over 120 Minutes Intravenous Every 12 hours 04/15/16 2237     04/15/16 2330  oseltamivir (TAMIFLU) capsule 75 mg  Status:  Discontinued     75 mg Oral 2 times daily 04/15/16 2329 04/15/16 2352   04/15/16 2300  ceFEPIme (MAXIPIME) 1 g in dextrose 5 % 50 mL IVPB     1 g 100 mL/hr over 30 Minutes Intravenous Every 8 hours 04/15/16 2232     04/15/16 2245  vancomycin (VANCOCIN) 1,500 mg in sodium chloride 0.9 % 500 mL IVPB     1,500 mg 250 mL/hr over 120 Minutes Intravenous  Once 04/15/16 2232 04/16/16 0228   04/15/16 2000  oseltamivir (TAMIFLU) capsule 75 mg     75 mg Oral  Once 04/15/16 1950 04/15/16 2000   04/15/16 1900  cefTRIAXone (ROCEPHIN) 1 g in dextrose 5 % 50 mL IVPB     1 g 100 mL/hr over 30 Minutes Intravenous  Once 04/15/16 1857 04/15/16 2055   04/15/16 1900  azithromycin (ZITHROMAX) 500 mg in dextrose 5 % 250 mL IVPB     500 mg 250 mL/hr over 60 Minutes Intravenous  Once 04/15/16 1857 04/15/16 2055        Subjective:   Andrea Wade was seen and examined today.Feeling somewhat better today, afebrile, coughing is improving. BP is improving. No nausea or vomiting. Patient denies dizziness, shortness of breath, D/C, new weakness, numbess, tingling. No acute events overnight.    Objective:   Vitals:   04/17/16 0758 04/17/16 0900 04/17/16 1056 04/17/16 1130  BP:  135/68 135/68   Pulse:  76 74 74  Resp:  17 16 (!) 21    Temp:  98.7 F (37.1 C)    TempSrc:  Oral    SpO2: 95% 95% 95% 91%  Weight:      Height:        Intake/Output Summary (Last 24 hours) at 04/17/16 1335 Last data filed at 04/17/16 1200  Gross per 24 hour  Intake             3780 ml  Output  6550 ml  Net            -2770 ml     Wt Readings from Last 3 Encounters:  04/16/16 116.7 kg (257 lb 4.4 oz)  03/31/16 112.8 kg (248 lb 11.2 oz)  11/19/15 108.9 kg (240 lb)     Exam  General: Alert and oriented x 3, NAD,  HEENT:  Neck: Supple, no JVD  Cardiovascular: S1 S2 clear, RRR  Respiratory: Decreased breath sounds at the bases  Gastrointestinal: Soft, nontender, nondistended, + bowel sounds  Ext: no cyanosis clubbing or edema  Neuro: no new deficits  Skin: No rashes  Psych: Normal affect and demeanor, alert and oriented x3    Data Reviewed:  I have personally reviewed following labs and imaging studies  Micro Results Recent Results (from the past 240 hour(s))  Urine culture     Status: Abnormal (Preliminary result)   Collection Time: 04/16/16  2:32 AM  Result Value Ref Range Status   Specimen Description URINE, RANDOM  Final   Special Requests NONE  Final   Culture 60,000 COLONIES/mL STAPHYLOCOCCUS AUREUS (A)  Final   Report Status PENDING  Incomplete  MRSA PCR Screening     Status: None   Collection Time: 04/16/16  5:39 AM  Result Value Ref Range Status   MRSA by PCR NEGATIVE NEGATIVE Final    Comment:        The GeneXpert MRSA Assay (FDA approved for NASAL specimens only), is one component of a comprehensive MRSA colonization surveillance program. It is not intended to diagnose MRSA infection nor to guide or monitor treatment for MRSA infections.     Radiology Reports Dg Chest 2 View  Result Date: 04/15/2016 CLINICAL DATA:  Pneumonia, EXAM: CHEST  2 VIEW COMPARISON:  Chest radiograph 12/01/2015 FINDINGS: Normal cardiac silhouette. There is subtle RIGHT infrahilar density over the  cardiophrenic angle. No pneumothorax. No pleural fluid. IMPRESSION: Potential RIGHT lower lobe pneumonia. Atelectasis could have a similar pattern. Electronically Signed   By: Suzy Bouchard M.D.   On: 04/15/2016 17:15   Ct Abdomen Pelvis W Contrast  Result Date: 03/29/2016 CLINICAL DATA:  Lower abdominal pain for 1 week EXAM: CT ABDOMEN AND PELVIS WITH CONTRAST TECHNIQUE: Multidetector CT imaging of the abdomen and pelvis was performed using the standard protocol following bolus administration of intravenous contrast. CONTRAST:  135m ISOVUE-300 IOPAMIDOL (ISOVUE-300) INJECTION 61% COMPARISON:  02/29/16 FINDINGS: Lower chest: No acute abnormality. Hepatobiliary: Liver is diffusely fatty infiltrated. The gallbladder has been surgically removed. Pancreas: Unremarkable. No pancreatic ductal dilatation or surrounding inflammatory changes. Spleen: Normal in size without focal abnormality. Adrenals/Urinary Tract: The adrenal glands and left kidney are within normal limits. Right kidney demonstrates some decreased enhancement and perinephric stranding. Delayed images demonstrate geographic large area of decreased attenuation and enhancement. These changes are consistent with pyelonephritis. These are similar but somewhat more marked than that seen on the prior exam. Mild fullness of the right collecting system is noted without definitive calculus. Bladder is well distended. Stomach/Bowel: Stomach is within normal limits. Appendix appears normal. No evidence of bowel wall thickening, distention, or inflammatory changes. Vascular/Lymphatic: No significant vascular findings are present. No enlarged abdominal or pelvic lymph nodes. Reproductive: Uterus and bilateral adnexa are unremarkable. Changes of tubal ligation are noted. Other: Small fat containing umbilical hernia is seen. No free pelvic fluid is noted. Musculoskeletal: Degenerative changes of lumbar spine are noted. IMPRESSION: Changes consistent with right  pyelonephritis. The area of poor enhancement is increased in size when  compared with the prior exam. Chronic changes as described above. Electronically Signed   By: Inez Catalina M.D.   On: 03/29/2016 16:28    Lab Data:  CBC:  Recent Labs Lab 04/15/16 1643 04/16/16 0400 04/17/16 0245  WBC 7.6 6.9 4.6  NEUTROABS 5.7  --   --   HGB 10.5* 9.1* 8.7*  HCT 35.2* 30.8* 29.9*  MCV 63.9* 64.2* 64.6*  PLT 479* 345 585   Basic Metabolic Panel:  Recent Labs Lab 04/15/16 1643 04/16/16 0400 04/17/16 0245  NA 131* 135 136  K 4.1 3.7 3.3*  CL 94* 102 103  CO2 _0 GLUCOSE 205* 122* 107*  BUN <5* 5* <5*  CREATININE 0.64 0.52 0.51  CALCIUM 9.0 7.8* 7.9*   GFR: Estimated Creatinine Clearance: 119.9 mL/min (by C-G formula based on SCr of 0.51 mg/dL). Liver Function Tests:  Recent Labs Lab 04/15/16 1643  AST 33  ALT 24  ALKPHOS 82  BILITOT 0.4  PROT 8.3*  ALBUMIN 3.4*   No results for input(s): LIPASE, AMYLASE in the last 168 hours. No results for input(s): AMMONIA in the last 168 hours. Coagulation Profile: No results for input(s): INR, PROTIME in the last 168 hours. Cardiac Enzymes: No results for input(s): CKTOTAL, CKMB, CKMBINDEX, TROPONINI in the last 168 hours. BNP (last 3 results) No results for input(s): PROBNP in the last 8760 hours. HbA1C:  Recent Labs  04/16/16 0400  HGBA1C 7.4*   CBG:  Recent Labs Lab 04/16/16 1948 04/17/16 0030 04/17/16 0502 04/17/16 0753 04/17/16 1121  GLUCAP 109* 107* 123* 105* 169*   Lipid Profile: No results for input(s): CHOL, HDL, LDLCALC, TRIG, CHOLHDL, LDLDIRECT in the last 72 hours. Thyroid Function Tests: No results for input(s): TSH, T4TOTAL, FREET4, T3FREE, THYROIDAB in the last 72 hours. Anemia Panel: No results for input(s): VITAMINB12, FOLATE, FERRITIN, TIBC, IRON, RETICCTPCT in the last 72 hours. Urine analysis:    Component Value Date/Time   COLORURINE YELLOW 04/16/2016 0205   APPEARANCEUR CLOUDY (A)  04/16/2016 0205   LABSPEC 1.021 04/16/2016 0205   PHURINE 5.0 04/16/2016 0205   GLUCOSEU >=500 (A) 04/16/2016 0205   HGBUR SMALL (A) 04/16/2016 0205   BILIRUBINUR NEGATIVE 04/16/2016 0205   KETONESUR NEGATIVE 04/16/2016 0205   PROTEINUR 30 (A) 04/16/2016 0205   UROBILINOGEN 0.2 07/22/2013 1703   NITRITE NEGATIVE 04/16/2016 0205   LEUKOCYTESUR MODERATE (A) 04/16/2016 0205     Shep Porter M.D. [facility] 04/17/2016, 1:35 PM  Pager: [phone] Between 7am to 7pm - call Pager - [phone]  After 7pm go to [email or web address] - password [email or web address]  Call night coverage person covering after 7pm

## 2016-04-18 ENCOUNTER — Encounter (HOSPITAL_COMMUNITY): Payer: Self-pay | Admitting: Radiology

## 2016-04-18 ENCOUNTER — Inpatient Hospital Stay (HOSPITAL_COMMUNITY): Payer: Medicare HMO

## 2016-04-18 ENCOUNTER — Encounter (HOSPITAL_COMMUNITY): Payer: Medicare HMO

## 2016-04-18 LAB — BASIC METABOLIC PANEL
ANION GAP: 8 (ref 5–15)
CHLORIDE: 105 mmol/L (ref 101–111)
CO2: 26 mmol/L (ref 22–32)
Calcium: 8.2 mg/dL — ABNORMAL LOW (ref 8.9–10.3)
Creatinine, Ser: 0.46 mg/dL (ref 0.44–1.00)
GFR calc Af Amer: >60 mL/min (ref >60)
GFR calc non Af Amer: >60 mL/min (ref >60)
GLUCOSE: 120 mg/dL — AB (ref 65–99)
POTASSIUM: 3.6 mmol/L (ref 3.5–5.1)
Sodium: 139 mmol/L (ref 135–145)

## 2016-04-18 LAB — CBC
HEMATOCRIT: 31.5 % — AB (ref 36.0–46.0)
HEMOGLOBIN: 8.9 g/dL — AB (ref 12.0–15.0)
MCH: 18.5 pg — AB (ref 26.0–34.0)
MCHC: 28.3 g/dL — ABNORMAL LOW (ref 30.0–36.0)
MCV: 65.6 fL — AB (ref 78.0–100.0)
PLATELETS: 287 10*3/uL (ref 150–400)
RBC: 4.8 MIL/uL (ref 3.87–5.11)
RDW: 20.6 % — ABNORMAL HIGH (ref 11.5–15.5)
WBC: 5.2 10*3/uL (ref 4.0–10.5)

## 2016-04-18 LAB — GLUCOSE, CAPILLARY
GLUCOSE-CAPILLARY: 126 mg/dL — AB (ref 65–99)
GLUCOSE-CAPILLARY: 192 mg/dL — AB (ref 65–99)
GLUCOSE-CAPILLARY: 148 mg/dL — AB (ref 65–99)
GLUCOSE-CAPILLARY: 181 mg/dL — AB (ref 65–99)

## 2016-04-18 LAB — BRAIN NATRIURETIC PEPTIDE: B Natriuretic Peptide: 39.2 pg/mL (ref 0.0–100.0)

## 2016-04-18 LAB — D-DIMER, QUANTITATIVE: D-Dimer, Quant: 0.54 ug/mL-FEU — ABNORMAL HIGH (ref 0.00–0.50)

## 2016-04-18 LAB — URINE CULTURE

## 2016-04-18 LAB — VANCOMYCIN, TROUGH: Vancomycin Tr: 11 ug/mL — ABNORMAL LOW (ref 15–20)

## 2016-04-18 MED ORDER — VANCOMYCIN HCL 10 G IV SOLR
2000.0000 mg | Freq: Two times a day (BID) | INTRAVENOUS | Status: DC
  Administered 2016-04-18 – 2016-04-19 (×4): 2000 mg via INTRAVENOUS
  Filled 2016-04-18 (×5): qty 2000

## 2016-04-18 MED ORDER — IOPAMIDOL (ISOVUE-370) INJECTION 76%
INTRAVENOUS | Status: AC
  Administered 2016-04-18: 100 mL
  Filled 2016-04-18: qty 100

## 2016-04-18 NOTE — Progress Notes (Signed)
Pharmacy Antibiotic Note Andrea Wade is a 46 y.o. female admitted on 04/15/2016 with pneumonia.  Pharmacy has been consulted for Cefepime and vancomycin dosing.  Patient currently afebrile, wbc normal, renal function is normal.   Vancomycin level this am is below goal at 11, will adjust dose to 2g q12 hours for new trough of 15.  Pan sensitive staph noted in urine, consider de-escalating antibiotics at this time. New CXR ordered this morning  Plan: 1. Vancomycin 2000 mg IV every 12 hours; goal trough 15 -20 2. Cefepime 1 gram IV every 8 hours 3. SCr every 72 hours while on vancomycin    Height: 5' 9"  (175.3 cm) Weight: 257 lb 4.4 oz (116.7 kg) IBW/kg (Calculated) : 66.2  Temp (24hrs), Avg:98.3 F (36.8 C), Min:97.9 F (36.6 C), Max:98.6 F (37 C)   Recent Labs Lab 04/15/16 1643 04/15/16 1650 04/15/16 2032 04/15/16 2332 04/16/16 0120 04/16/16 0400 04/17/16 0245 04/18/16 0330 04/18/16 0823  WBC 7.6  --   --   --   --  6.9 4.6 5.2  --   CREATININE 0.64  --   --   --   --  0.52 0.51 0.46  --   LATICACIDVEN  --  1.69 2.40* 1.1 1.0  --   --   --   --   VANCOTROUGH  --   --   --   --   --   --   --   --  11*    Estimated Creatinine Clearance: 119.9 mL/min (by C-G formula based on SCr of 0.46 mg/dL).    Allergies  Allergen Reactions  . Codeine Hives, Itching and Palpitations  . Peanut-Containing Drug Products Anaphylaxis  . Tape Other (See Comments)    Slight irritation  . Aspirin Itching    High doses only-baby is ok  . Penicillins Hives, Itching and Nausea And Vomiting    Allergy from childhood Has patient had a PCN reaction causing immediate rash, facial/tongue/throat swelling, SOB or lightheadedness with hypotension: Yes Has patient had a PCN reaction causing severe rash involving mucus membranes or skin necrosis: Unk Has patient had a PCN reaction that required hospitalization: Unk Has patient had a PCN reaction occurring within the last 10 years: No If all of  the above answers are "NO", then may proceed with Cephalosporin use.      Antimicrobials this admission: 2/14 Cefepime >>  2/14 vancomycin >>   Dose adjustments this admission: 2/17 Vancomycin 1500 q12 hours >>2050m q12 hours  Microbiology results: 2/15 urine - staph aureus>>pan sensitive   Thank you for allowing pharmacy to be a part of this patient's care.  FErin HearingPharmD., BCPS Clinical Pharmacist Pager [phone]/17/2018 9:39 AM

## 2016-04-18 NOTE — Progress Notes (Signed)
On initial assessment, noted pitting edema in BLE, left 3+, R 1+. Pt endorses bilateral calf pain and + Homan's sign. No redness or warmth noted. Dr Tana Coast paged to notify.

## 2016-04-18 NOTE — Progress Notes (Signed)
[facility]                                                                              Patient Demographics  Andrea Wade, is a 46 y.o. female, DOB - 07-Feb-1971, ZOX:[id number]  Admit date - 04/15/2016   Admitting Physician Norval Morton, MD  Outpatient Primary MD for the patient is Daphene Jaeger, PA-C  Outpatient specialists:   LOS - 3  days    Chief Complaint  Patient presents with  . Influenza  . Pneumonia  . Emesis  . Diarrhea       Brief summary   The patient is a 46 year old female with diabetes, chronic pain syndrome, tobacco abuse presented with a productive cough for last 2 days with yellowish sputum. Patient also reported headache, sore throat, wheezing, fever up to 101F, chills, body aches, nausea vomiting and diarrhea.Patient was just recently hospitalized from 1/27- 1/34 pyelonephritis which patient reports completing a 14 day course of oral ciprofloxacin. In ED, patient was febrile up to 811B, hr 147, systolic BP in 82N, O2 sats 87%. Chest x-ray showed right lower lobe infiltrates. She was started on Tamiflu and antibiotics.   Assessment & Plan    Principal Problem: Influenza/healthcare associated pneumonia: - Patient met SIRS criteria secondary to hypotension, tachycardia, hypoxia, source pneumonia, influenza - Patient was sent here from urgent care at [facility] and was told she had pneumonia and positive flu test - Continue IV vancomycin, IV cefepime, Tamiflu. Transition to oral antibiotics in a.m. - Continue scheduled nebs, O2 supplement - Urine strep antigen negative - Still feels short of breath, O2 sats 92% on 3 L, states, not on any O2 at home, check BNP, - D-dimer elevated at 0.54, obtain CT angiogram of the chest to rule out PE.  - Asymmetrical lower extremity edema, left >right, rule out DVT    Hypotension: Systolic blood pressures noted to be as low as the 80s on admission - Likely due to #1, hold antihypertensives - BP  now stable,discontinued IV fluids   Acute respiratory failure with hypoxia: Initial O2 saturations noted to be stable was 87% on room air. -  Continue scheduled nebs, wean O2 as tolerated  UTI/history of pyelonephritis: Question of continuation of previous hospitalization infection. Patient reports taking ciprofloxacin. A repeat CT scan of the abdomen is recommended in 4 weeks to check for resolution of pyelonephritis. - Urine culture still positive, continue IV vancomycin and cefepime - Urine culture only 60,000 colonies of staph aureus  Diabetes mellitus type 2: Last hemoglobin A1c noted was 7.6 and 10/2015.  -Continue sliding scale insulin while inpatient  Chronic pain - Continue baclofen, amitriptyline, Dilaudid per her home schedule (discussed with pharmacy)  Chronic Anemia: Hemoglobin was noted to be 10.5 on admission. Patient's baseline hemoglobin is somewhere around 9. - Hemoglobin currently at baseline  Tobacco abuse: Patient reports quitting tobacco 2 days. declines need of a nicotine patch will hospitalized. - counseled on the need of cessation of tobacco   Code Status: full  DVT Prophylaxis:  Lovenox  Family Communication: Discussed in detail with the patient, all imaging results, lab results explained to the patient  Disposition Plan: CT angiogram negative for any PE, will transfer to telemetry floor today  Time Spent in minutes   25 minutes  Procedures:    Consultants:    Antimicrobials:   IV vancomycin  IV cefepime  Tamiflu   Medications  Scheduled Meds: . albuterol  2.5 mg Nebulization Q6H  . baclofen  20 mg Oral QID  . benzonatate  100 mg Oral TID  . ceFEPime (MAXIPIME) IV  1 g Intravenous Q8H  . DULoxetine  60 mg Oral Daily  . Eluxadoline  75 mg Oral BID  . enoxaparin (LOVENOX) injection  55 mg Subcutaneous Q24H  . Eslicarbazepine Acetate  1,600 mg Oral QHS  . Gabapentin Enacarbil  600 mg Oral BID  . guaiFENesin  600 mg Oral BID  .  HYDROmorphone HCl  8 mg Oral Q24H  . insulin aspart  0-5 Units Subcutaneous QHS  . insulin aspart  0-9 Units Subcutaneous TID WC  . nortriptyline  50 mg Oral QHS  . oseltamivir  75 mg Oral BID  . pantoprazole  40 mg Oral Daily  . vancomycin  2,000 mg Intravenous Q12H   Continuous Infusions:  PRN Meds:.acetaminophen **OR** acetaminophen, guaiFENesin-dextromethorphan, HYDROmorphone, ipratropium, ondansetron **OR** ondansetron (ZOFRAN) IV, promethazine   Antibiotics   Anti-infectives    Start     Dose/Rate Route Frequency Ordered Stop   04/18/16 1000  vancomycin (VANCOCIN) 2,000 mg in sodium chloride 0.9 % 500 mL IVPB     2,000 mg 250 mL/hr over 120 Minutes Intravenous Every 12 hours 04/18/16 0932     04/16/16 1000  oseltamivir (TAMIFLU) capsule 75 mg     75 mg Oral 2 times daily 04/15/16 2353 04/20/16 2159   04/16/16 0900  vancomycin (VANCOCIN) 1,500 mg in sodium chloride 0.9 % 500 mL IVPB  Status:  Discontinued     1,500 mg 250 mL/hr over 120 Minutes Intravenous Every 12 hours 04/15/16 2237 04/18/16 0932   04/15/16 2330  oseltamivir (TAMIFLU) capsule 75 mg  Status:  Discontinued     75 mg Oral 2 times daily 04/15/16 2329 04/15/16 2352   04/15/16 2300  ceFEPIme (MAXIPIME) 1 g in dextrose 5 % 50 mL IVPB     1 g 100 mL/hr over 30 Minutes Intravenous Every 8 hours 04/15/16 2232     04/15/16 2245  vancomycin (VANCOCIN) 1,500 mg in sodium chloride 0.9 % 500 mL IVPB     1,500 mg 250 mL/hr over 120 Minutes Intravenous  Once 04/15/16 2232 04/16/16 0228   04/15/16 2000  oseltamivir (TAMIFLU) capsule 75 mg     75 mg Oral  Once 04/15/16 1950 04/15/16 2000   04/15/16 1900  cefTRIAXone (ROCEPHIN) 1 g in dextrose 5 % 50 mL IVPB     1 g 100 mL/hr over 30 Minutes Intravenous  Once 04/15/16 1857 04/15/16 2055   04/15/16 1900  azithromycin (ZITHROMAX) 500 mg in dextrose 5 % 250 mL IVPB     500 mg 250 mL/hr over 60 Minutes Intravenous  Once 04/15/16 1857 04/15/16 2055        Subjective:    Angelmarie Ponzo was seen and examined today. Still feels tired, weak, somewhat short of breath today. BP is improving. No nausea or vomiting. Patient denies dizziness,  abdominal pain,D/C, new weakness, numbess, tingling. No acute events overnight.    Objective:   Vitals:   04/18/16 0432 04/18/16 0808 04/18/16 0826 04/18/16 0900  BP: 119/78  118/73   Pulse: 65  74 66  Resp: 14  20 14  Temp: 97.9 F (36.6 C)  98.6 F (37 C)   TempSrc: Oral  Oral   SpO2: 93% 94% 90% 92%  Weight:      Height:        Intake/Output Summary (Last 24 hours) at 04/18/16 1118 Last data filed at 04/18/16 0900  Gross per 24 hour  Intake             1830 ml  Output             4750 ml  Net            -2920 ml     Wt Readings from Last 3 Encounters:  04/16/16 116.7 kg (257 lb 4.4 oz)  03/31/16 112.8 kg (248 lb 11.2 oz)  11/19/15 108.9 kg (240 lb)     Exam  General: Alert and oriented x 3, NAD,  HEENT:  Neck: Supple, no JVD  Cardiovascular: S1 S2 clear, RRR  Respiratory: Decreased breath sounds at the bases  Gastrointestinal: Soft, nontender, nondistended, + bowel sounds  Ext: no cyanosis clubbing or edema  Neuro: no new deficits  Skin: No rashes  Psych: Normal affect and demeanor, alert and oriented x3    Data Reviewed:  I have personally reviewed following labs and imaging studies  Micro Results Recent Results (from the past 240 hour(s))  Urine culture     Status: Abnormal   Collection Time: 04/16/16  2:32 AM  Result Value Ref Range Status   Specimen Description URINE, RANDOM  Final   Special Requests NONE  Final   Culture 60,000 COLONIES/mL STAPHYLOCOCCUS AUREUS (A)  Final   Report Status 04/18/2016 FINAL  Final   Organism ID, Bacteria STAPHYLOCOCCUS AUREUS (A)  Final      Susceptibility   Staphylococcus aureus - MIC*    CIPROFLOXACIN <=0.5 SENSITIVE Sensitive     GENTAMICIN <=0.5 SENSITIVE Sensitive     NITROFURANTOIN <=16 SENSITIVE Sensitive     OXACILLIN 0.5  SENSITIVE Sensitive     TETRACYCLINE <=1 SENSITIVE Sensitive     VANCOMYCIN 1 SENSITIVE Sensitive     TRIMETH/SULFA <=10 SENSITIVE Sensitive     CLINDAMYCIN <=0.25 SENSITIVE Sensitive     RIFAMPIN <=0.5 SENSITIVE Sensitive     Inducible Clindamycin NEGATIVE Sensitive     * 60,000 COLONIES/mL STAPHYLOCOCCUS AUREUS  MRSA PCR Screening     Status: None   Collection Time: 04/16/16  5:39 AM  Result Value Ref Range Status   MRSA by PCR NEGATIVE NEGATIVE Final    Comment:        The GeneXpert MRSA Assay (FDA approved for NASAL specimens only), is one component of a comprehensive MRSA colonization surveillance program. It is not intended to diagnose MRSA infection nor to guide or monitor treatment for MRSA infections.     Radiology Reports Dg Chest 2 View  Result Date: 04/15/2016 CLINICAL DATA:  Pneumonia, EXAM: CHEST  2 VIEW COMPARISON:  Chest radiograph 12/01/2015 FINDINGS: Normal cardiac silhouette. There is subtle RIGHT infrahilar density over the cardiophrenic angle. No pneumothorax. No pleural fluid. IMPRESSION: Potential RIGHT lower lobe pneumonia. Atelectasis could have a similar pattern. Electronically Signed   By: Suzy Bouchard M.D.   On: 04/15/2016 17:15   Ct Abdomen Pelvis W Contrast  Result Date: 03/29/2016 CLINICAL DATA:  Lower abdominal pain for 1 week EXAM: CT ABDOMEN AND PELVIS WITH CONTRAST TECHNIQUE: Multidetector CT imaging of the abdomen and pelvis was performed using the standard protocol following bolus administration of intravenous contrast.  CONTRAST:  127m ISOVUE-300 IOPAMIDOL (ISOVUE-300) INJECTION 61% COMPARISON:  02/29/16 FINDINGS: Lower chest: No acute abnormality. Hepatobiliary: Liver is diffusely fatty infiltrated. The gallbladder has been surgically removed. Pancreas: Unremarkable. No pancreatic ductal dilatation or surrounding inflammatory changes. Spleen: Normal in size without focal abnormality. Adrenals/Urinary Tract: The adrenal glands and left kidney  are within normal limits. Right kidney demonstrates some decreased enhancement and perinephric stranding. Delayed images demonstrate geographic large area of decreased attenuation and enhancement. These changes are consistent with pyelonephritis. These are similar but somewhat more marked than that seen on the prior exam. Mild fullness of the right collecting system is noted without definitive calculus. Bladder is well distended. Stomach/Bowel: Stomach is within normal limits. Appendix appears normal. No evidence of bowel wall thickening, distention, or inflammatory changes. Vascular/Lymphatic: No significant vascular findings are present. No enlarged abdominal or pelvic lymph nodes. Reproductive: Uterus and bilateral adnexa are unremarkable. Changes of tubal ligation are noted. Other: Small fat containing umbilical hernia is seen. No free pelvic fluid is noted. Musculoskeletal: Degenerative changes of lumbar spine are noted. IMPRESSION: Changes consistent with right pyelonephritis. The area of poor enhancement is increased in size when compared with the prior exam. Chronic changes as described above. Electronically Signed   By: MInez CatalinaM.D.   On: 03/29/2016 16:28    Lab Data:  CBC:  Recent Labs Lab 04/15/16 1643 04/16/16 0400 04/17/16 0245 04/18/16 0330  WBC 7.6 6.9 4.6 5.2  NEUTROABS 5.7  --   --   --   HGB 10.5* 9.1* 8.7* 8.9*  HCT 35.2* 30.8* 29.9* 31.5*  MCV 63.9* 64.2* 64.6* 65.6*  PLT 479* 345 286 2387  Basic Metabolic Panel:  Recent Labs Lab 04/15/16 1643 04/16/16 0400 04/17/16 0245 04/18/16 0330  NA 131* 135 136 139  K 4.1 3.7 3.3* 3.6  CL 94* 102 103 105  CO2 _0 GLUCOSE 205* 122* 107* 120*  BUN <5* 5* <5* <5*  CREATININE 0.64 0.52 0.51 0.46  CALCIUM 9.0 7.8* 7.9* 8.2*   GFR: Estimated Creatinine Clearance: 119.9 mL/min (by C-G formula based on SCr of 0.46 mg/dL). Liver Function Tests:  Recent Labs Lab 04/15/16 1643  AST 33  ALT 24  ALKPHOS 82    BILITOT 0.4  PROT 8.3*  ALBUMIN 3.4*   No results for input(s): LIPASE, AMYLASE in the last 168 hours. No results for input(s): AMMONIA in the last 168 hours. Coagulation Profile: No results for input(s): INR, PROTIME in the last 168 hours. Cardiac Enzymes: No results for input(s): CKTOTAL, CKMB, CKMBINDEX, TROPONINI in the last 168 hours. BNP (last 3 results) No results for input(s): PROBNP in the last 8760 hours. HbA1C:  Recent Labs  04/16/16 0400  HGBA1C 7.4*   CBG:  Recent Labs Lab 04/17/16 1121 04/17/16 1615 04/17/16 1959 04/17/16 2131 04/18/16 0825  GLUCAP 169* 165* 180* 208* 126*   Lipid Profile: No results for input(s): CHOL, HDL, LDLCALC, TRIG, CHOLHDL, LDLDIRECT in the last 72 hours. Thyroid Function Tests: No results for input(s): TSH, T4TOTAL, FREET4, T3FREE, THYROIDAB in the last 72 hours. Anemia Panel: No results for input(s): VITAMINB12, FOLATE, FERRITIN, TIBC, IRON, RETICCTPCT in the last 72 hours. Urine analysis:    Component Value Date/Time   COLORURINE YELLOW 04/16/2016 0205   APPEARANCEUR CLOUDY (A) 04/16/2016 0205   LABSPEC 1.021 04/16/2016 0205   PHURINE 5.0 04/16/2016 0205   GLUCOSEU >=500 (A) 04/16/2016 0205   HGBUR SMALL (A) 04/16/2016 0205   BILIRUBINUR NEGATIVE 04/16/2016 0205  KETONESUR NEGATIVE 04/16/2016 0205   PROTEINUR 30 (A) 04/16/2016 0205   UROBILINOGEN 0.2 07/22/2013 1703   NITRITE NEGATIVE 04/16/2016 0205   LEUKOCYTESUR MODERATE (A) 04/16/2016 0205     RAI,RIPUDEEP M.D. [facility] 04/18/2016, 11:18 AM  Pager: [phone] Between 7am to 7pm - call Pager - [phone]  After 7pm go to [email or web address] - password [email or web address]  Call night coverage person covering after 7pm

## 2016-04-19 ENCOUNTER — Inpatient Hospital Stay (HOSPITAL_COMMUNITY): Payer: Medicare HMO

## 2016-04-19 DIAGNOSIS — R609 Edema, unspecified: Secondary | ICD-10-CM

## 2016-04-19 LAB — BASIC METABOLIC PANEL
ANION GAP: 7 (ref 5–15)
BUN: <5 mg/dL — ABNORMAL LOW (ref 6–20)
CALCIUM: 8.4 mg/dL — AB (ref 8.9–10.3)
CO2: 28 mmol/L (ref 22–32)
Chloride: 103 mmol/L (ref 101–111)
Creatinine, Ser: 0.48 mg/dL (ref 0.44–1.00)
Glucose, Bld: 155 mg/dL — ABNORMAL HIGH (ref 65–99)
Potassium: 3.5 mmol/L (ref 3.5–5.1)
Sodium: 138 mmol/L (ref 135–145)

## 2016-04-19 LAB — CBC
HEMATOCRIT: 32.2 % — AB (ref 36.0–46.0)
Hemoglobin: 9.5 g/dL — ABNORMAL LOW (ref 12.0–15.0)
MCH: 19.4 pg — ABNORMAL LOW (ref 26.0–34.0)
MCHC: 29.5 g/dL — ABNORMAL LOW (ref 30.0–36.0)
MCV: 65.8 fL — ABNORMAL LOW (ref 78.0–100.0)
PLATELETS: 268 10*3/uL (ref 150–400)
RBC: 4.89 MIL/uL (ref 3.87–5.11)
RDW: 20.7 % — AB (ref 11.5–15.5)
WBC: 5.1 10*3/uL (ref 4.0–10.5)

## 2016-04-19 LAB — GLUCOSE, CAPILLARY
GLUCOSE-CAPILLARY: 152 mg/dL — AB (ref 65–99)
GLUCOSE-CAPILLARY: 149 mg/dL — AB (ref 65–99)
GLUCOSE-CAPILLARY: 130 mg/dL — AB (ref 65–99)
Glucose-Capillary: 184 mg/dL — ABNORMAL HIGH (ref 65–99)

## 2016-04-19 MED ORDER — ALBUTEROL SULFATE (2.5 MG/3ML) 0.083% IN NEBU
2.5000 mg | INHALATION_SOLUTION | Freq: Two times a day (BID) | RESPIRATORY_TRACT | Status: DC
  Filled 2016-04-19: qty 3

## 2016-04-19 MED ORDER — ALBUTEROL SULFATE (2.5 MG/3ML) 0.083% IN NEBU
2.5000 mg | INHALATION_SOLUTION | RESPIRATORY_TRACT | Status: DC | PRN
Start: 2016-04-19 — End: 2016-04-20

## 2016-04-19 NOTE — Progress Notes (Signed)
[facility]                                                                              Patient Demographics  Andrea Wade, is a 46 y.o. female, DOB - 03/31/1970, FGH:[id number]  Admit date - 04/15/2016   Admitting Physician Norval Morton, MD  Outpatient Primary MD for the patient is Daphene Jaeger, PA-C  Outpatient specialists:   LOS - 4  days    Chief Complaint  Patient presents with  . Influenza  . Pneumonia  . Emesis  . Diarrhea       Brief summary   The patient is a 46 year old female with diabetes, chronic pain syndrome, tobacco abuse presented with a productive cough for last 2 days with yellowish sputum. Patient also reported headache, sore throat, wheezing, fever up to 101F, chills, body aches, nausea vomiting and diarrhea.Patient was just recently hospitalized from 1/27- 1/34 pyelonephritis which patient reports completing a 14 day course of oral ciprofloxacin. In ED, patient was febrile up to 678L, hr 381, systolic BP in 01B, O2 sats 87%. Chest x-ray showed right lower lobe infiltrates. She was started on Tamiflu and antibiotics.   Assessment & Plan    Principal Problem: Influenza/healthcare associated pneumonia/Multifocal pneumonia: - Patient met SIRS criteria secondary to hypotension, tachycardia, hypoxia, source pneumonia, influenza - Patient was sent here from urgent care at [facility] and was told she had pneumonia and positive flu test - Continue IV vancomycin, IV cefepime, Tamiflu. Transition to oral antibiotics in a.m. - Continue scheduled nebs, O2 supplement - Urine strep antigen negative - BNP 29.2, CT angiogram of this chest showed multifocal pneumonia, no PE - Doppler ultrasound of the lower legs showed no DVT    Hypotension: Systolic blood pressures noted to be as low as the 80s on admission - BP currently stable  Acute respiratory failure with hypoxia: Initial O2 saturations noted to be stable was 87% on room air. -   Continue scheduled nebs, wean O2 as tolerated  UTI/history of pyelonephritis: Question of continuation of previous hospitalization infection. Patient reports taking ciprofloxacin. A repeat CT scan of the abdomen is recommended in 4 weeks to check for resolution of pyelonephritis. - Urine culture still positive, continue IV vancomycin and cefepime.  - Urine culture only 60,000 colonies of staph aureus - will transition to oral levofloxacin in am which will cover pneumonia and pyelonephritis  Diabetes mellitus type 2: Last hemoglobin A1c noted was 7.6 and 10/2015.  -Continue sliding scale insulin while inpatient  Chronic pain - Continue baclofen, amitriptyline, Dilaudid per her home schedule (discussed with pharmacy)  Chronic Anemia: Hemoglobin was noted to be 10.5 on admission. Patient's baseline hemoglobin is somewhere around 9. - Hemoglobin currently at baseline  Tobacco abuse: Patient reports quitting tobacco 2 days. declines need of a nicotine patch will hospitalized. - counseled on the need of cessation of tobacco   Code Status: full  DVT Prophylaxis:  Lovenox  Family Communication: Discussed in detail with the patient, all imaging results, lab results explained to the patient    Disposition Plan: transfer to tele today, possible DC home tomorrow  Time Spent in minutes  25 minutes  Procedures:    Consultants:    Antimicrobials:   IV vancomycin  IV cefepime  Tamiflu   Medications  Scheduled Meds: . albuterol  2.5 mg Nebulization Q6H  . baclofen  20 mg Oral QID  . benzonatate  100 mg Oral TID  . ceFEPime (MAXIPIME) IV  1 g Intravenous Q8H  . DULoxetine  60 mg Oral Daily  . Eluxadoline  75 mg Oral BID  . enoxaparin (LOVENOX) injection  55 mg Subcutaneous Q24H  . Eslicarbazepine Acetate  1,600 mg Oral QHS  . Gabapentin Enacarbil  600 mg Oral BID  . guaiFENesin  600 mg Oral BID  . HYDROmorphone HCl  8 mg Oral Q24H  . insulin aspart  0-5 Units Subcutaneous  QHS  . insulin aspart  0-9 Units Subcutaneous TID WC  . nortriptyline  50 mg Oral QHS  . oseltamivir  75 mg Oral BID  . pantoprazole  40 mg Oral Daily  . vancomycin  2,000 mg Intravenous Q12H   Continuous Infusions:  PRN Meds:.acetaminophen **OR** acetaminophen, guaiFENesin-dextromethorphan, HYDROmorphone, ipratropium, ondansetron **OR** ondansetron (ZOFRAN) IV, promethazine   Antibiotics   Anti-infectives    Start     Dose/Rate Route Frequency Ordered Stop   04/18/16 1000  vancomycin (VANCOCIN) 2,000 mg in sodium chloride 0.9 % 500 mL IVPB     2,000 mg 250 mL/hr over 120 Minutes Intravenous Every 12 hours 04/18/16 0932     04/16/16 1000  oseltamivir (TAMIFLU) capsule 75 mg     75 mg Oral 2 times daily 04/15/16 2353 04/20/16 2159   04/16/16 0900  vancomycin (VANCOCIN) 1,500 mg in sodium chloride 0.9 % 500 mL IVPB  Status:  Discontinued     1,500 mg 250 mL/hr over 120 Minutes Intravenous Every 12 hours 04/15/16 2237 04/18/16 0932   04/15/16 2330  oseltamivir (TAMIFLU) capsule 75 mg  Status:  Discontinued     75 mg Oral 2 times daily 04/15/16 2329 04/15/16 2352   04/15/16 2300  ceFEPIme (MAXIPIME) 1 g in dextrose 5 % 50 mL IVPB     1 g 100 mL/hr over 30 Minutes Intravenous Every 8 hours 04/15/16 2232     04/15/16 2245  vancomycin (VANCOCIN) 1,500 mg in sodium chloride 0.9 % 500 mL IVPB     1,500 mg 250 mL/hr over 120 Minutes Intravenous  Once 04/15/16 2232 04/16/16 0228   04/15/16 2000  oseltamivir (TAMIFLU) capsule 75 mg     75 mg Oral  Once 04/15/16 1950 04/15/16 2000   04/15/16 1900  cefTRIAXone (ROCEPHIN) 1 g in dextrose 5 % 50 mL IVPB     1 g 100 mL/hr over 30 Minutes Intravenous  Once 04/15/16 1857 04/15/16 2055   04/15/16 1900  azithromycin (ZITHROMAX) 500 mg in dextrose 5 % 250 mL IVPB     500 mg 250 mL/hr over 60 Minutes Intravenous  Once 04/15/16 1857 04/15/16 2055        Subjective:   Andrea Wade was seen and examined today. Feeling a lot better today.  Denies any specific complaints except feels hands are getting swollen due to fluids. Still weak. No fevers or chills.  No nausea or vomiting. Patient denies dizziness,  abdominal pain,D/C, new weakness, numbess, tingling. No acute events overnight.    Objective:   Vitals:   04/19/16 0300 04/19/16 0635 04/19/16 0817 04/19/16 0855  BP: 130/78  (!) 129/91   Pulse: 75 72 82   Resp: _0 Temp: 97.6 F (  36.4 C)  98 F (36.7 C)   TempSrc: Oral  Oral   SpO2: 99% 99% 95% 95%  Weight: 112.2 kg (247 lb 6.4 oz)     Height:        Intake/Output Summary (Last 24 hours) at 04/19/16 1046 Last data filed at 04/19/16 0901  Gross per 24 hour  Intake             2320 ml  Output             6275 ml  Net            -3955 ml     Wt Readings from Last 3 Encounters:  04/19/16 112.2 kg (247 lb 6.4 oz)  03/31/16 112.8 kg (248 lb 11.2 oz)  11/19/15 108.9 kg (240 lb)     Exam  General: Alert and oriented x 3, NAD,  HEENT:  Neck: Supple, no JVD  Cardiovascular: S1 S2 clear, RRR  Respiratory: Decreased breath sounds at the bases  Gastrointestinal: Soft, nontender, nondistended, + bowel sounds  Ext: no cyanosis clubbing or edema  Neuro: no new deficits  Skin: No rashes  Psych: Normal affect and demeanor, alert and oriented x3    Data Reviewed:  I have personally reviewed following labs and imaging studies  Micro Results Recent Results (from the past 240 hour(s))  Urine culture     Status: Abnormal   Collection Time: 04/16/16  2:32 AM  Result Value Ref Range Status   Specimen Description URINE, RANDOM  Final   Special Requests NONE  Final   Culture 60,000 COLONIES/mL STAPHYLOCOCCUS AUREUS (A)  Final   Report Status 04/18/2016 FINAL  Final   Organism ID, Bacteria STAPHYLOCOCCUS AUREUS (A)  Final      Susceptibility   Staphylococcus aureus - MIC*    CIPROFLOXACIN <=0.5 SENSITIVE Sensitive     GENTAMICIN <=0.5 SENSITIVE Sensitive     NITROFURANTOIN <=16 SENSITIVE Sensitive      OXACILLIN 0.5 SENSITIVE Sensitive     TETRACYCLINE <=1 SENSITIVE Sensitive     VANCOMYCIN 1 SENSITIVE Sensitive     TRIMETH/SULFA <=10 SENSITIVE Sensitive     CLINDAMYCIN <=0.25 SENSITIVE Sensitive     RIFAMPIN <=0.5 SENSITIVE Sensitive     Inducible Clindamycin NEGATIVE Sensitive     * 60,000 COLONIES/mL STAPHYLOCOCCUS AUREUS  MRSA PCR Screening     Status: None   Collection Time: 04/16/16  5:39 AM  Result Value Ref Range Status   MRSA by PCR NEGATIVE NEGATIVE Final    Comment:        The GeneXpert MRSA Assay (FDA approved for NASAL specimens only), is one component of a comprehensive MRSA colonization surveillance program. It is not intended to diagnose MRSA infection nor to guide or monitor treatment for MRSA infections.     Radiology Reports Dg Chest 2 View  Result Date: 04/15/2016 CLINICAL DATA:  Pneumonia, EXAM: CHEST  2 VIEW COMPARISON:  Chest radiograph 12/01/2015 FINDINGS: Normal cardiac silhouette. There is subtle RIGHT infrahilar density over the cardiophrenic angle. No pneumothorax. No pleural fluid. IMPRESSION: Potential RIGHT lower lobe pneumonia. Atelectasis could have a similar pattern. Electronically Signed   By: Suzy Bouchard M.D.   On: 04/15/2016 17:15   Ct Angio Chest Pe W Or Wo Contrast  Result Date: 04/18/2016 CLINICAL DATA:  Patient with chest pain and shortness of breath. EXAM: CT ANGIOGRAPHY CHEST WITH CONTRAST TECHNIQUE: Multidetector CT imaging of the chest was performed using the standard protocol during bolus administration of intravenous  contrast. Multiplanar CT image reconstructions and MIPs were obtained to evaluate the vascular anatomy. CONTRAST:  80 cc Isovue 370 COMPARISON:  Chest radiograph 04/15/2016. FINDINGS: Cardiovascular: Normal heart size. No pericardial effusion. Aorta and main pulmonary artery normal in caliber. No abnormal filling defects identified within the opacified pulmonary arteries to suggest acute pulmonary embolus.  Mediastinum/Nodes: No axillary adenopathy. Enlarged mediastinal and hilar lymph nodes. Reference subcarinal lymph node measures 2.0 cm (image 65; series 4), left hilar lymph node measures 1.1 cm (image 60; series 4) and right hilar lymph node measures 1.4 cm (image 54 ; series 4). Normal esophagus. Lungs/Pleura: Central airways are patent. Patchy ground-glass and consolidative opacities are demonstrated predominately involving the left-greater-than-right upper lobes and left greater than right lower lobes. No pleural effusion or pneumothorax. Upper Abdomen: Hepatic steatosis. Musculoskeletal: Thoracic spine degenerative changes. No aggressive or acute appearing osseous lesions. Review of the MIP images confirms the above findings. IMPRESSION: No evidence for pulmonary embolus. Patchy ground-glass and consolidative opacities raising the possibility of multifocal pneumonia. Pulmonary edema could have a similar appearance. Recommend follow-up chest CT in 6- 8 weeks to assess for interval improvement/ resolution. Mediastinal and hilar adenopathy favored to be reactive in etiology. Electronically Signed   By: Lovey Newcomer M.D.   On: 04/18/2016 18:51   Ct Abdomen Pelvis W Contrast  Result Date: 03/29/2016 CLINICAL DATA:  Lower abdominal pain for 1 week EXAM: CT ABDOMEN AND PELVIS WITH CONTRAST TECHNIQUE: Multidetector CT imaging of the abdomen and pelvis was performed using the standard protocol following bolus administration of intravenous contrast. CONTRAST:  119m ISOVUE-300 IOPAMIDOL (ISOVUE-300) INJECTION 61% COMPARISON:  02/29/16 FINDINGS: Lower chest: No acute abnormality. Hepatobiliary: Liver is diffusely fatty infiltrated. The gallbladder has been surgically removed. Pancreas: Unremarkable. No pancreatic ductal dilatation or surrounding inflammatory changes. Spleen: Normal in size without focal abnormality. Adrenals/Urinary Tract: The adrenal glands and left kidney are within normal limits. Right kidney  demonstrates some decreased enhancement and perinephric stranding. Delayed images demonstrate geographic large area of decreased attenuation and enhancement. These changes are consistent with pyelonephritis. These are similar but somewhat more marked than that seen on the prior exam. Mild fullness of the right collecting system is noted without definitive calculus. Bladder is well distended. Stomach/Bowel: Stomach is within normal limits. Appendix appears normal. No evidence of bowel wall thickening, distention, or inflammatory changes. Vascular/Lymphatic: No significant vascular findings are present. No enlarged abdominal or pelvic lymph nodes. Reproductive: Uterus and bilateral adnexa are unremarkable. Changes of tubal ligation are noted. Other: Small fat containing umbilical hernia is seen. No free pelvic fluid is noted. Musculoskeletal: Degenerative changes of lumbar spine are noted. IMPRESSION: Changes consistent with right pyelonephritis. The area of poor enhancement is increased in size when compared with the prior exam. Chronic changes as described above. Electronically Signed   By: MInez CatalinaM.D.   On: 03/29/2016 16:28    Lab Data:  CBC:  Recent Labs Lab 04/15/16 1643 04/16/16 0400 04/17/16 0245 04/18/16 0330 04/19/16 0233  WBC 7.6 6.9 4.6 5.2 5.1  NEUTROABS 5.7  --   --   --   --   HGB 10.5* 9.1* 8.7* 8.9* 9.5*  HCT 35.2* 30.8* 29.9* 31.5* 32.2*  MCV 63.9* 64.2* 64.6* 65.6* 65.8*  PLT 479* 345 286 287 2144  Basic Metabolic Panel:  Recent Labs Lab 04/15/16 1643 04/16/16 0400 04/17/16 0245 04/18/16 0330 04/19/16 0233  NA 131* 135 136 139 138  K 4.1 3.7 3.3* 3.6 3.5  CL 94* 102  103 105 103  CO2 _0 GLUCOSE 205* 122* 107* 120* 155*  BUN <5* 5* <5* <5* <5*  CREATININE 0.64 0.52 0.51 0.46 0.48  CALCIUM 9.0 7.8* 7.9* 8.2* 8.4*   GFR: Estimated Creatinine Clearance: 117.4 mL/min (by C-G formula based on SCr of 0.48 mg/dL). Liver Function Tests:  Recent  Labs Lab 04/15/16 1643  AST 33  ALT 24  ALKPHOS 82  BILITOT 0.4  PROT 8.3*  ALBUMIN 3.4*   No results for input(s): LIPASE, AMYLASE in the last 168 hours. No results for input(s): AMMONIA in the last 168 hours. Coagulation Profile: No results for input(s): INR, PROTIME in the last 168 hours. Cardiac Enzymes: No results for input(s): CKTOTAL, CKMB, CKMBINDEX, TROPONINI in the last 168 hours. BNP (last 3 results) No results for input(s): PROBNP in the last 8760 hours. HbA1C: No results for input(s): HGBA1C in the last 72 hours. CBG:  Recent Labs Lab 04/18/16 0825 04/18/16 1242 04/18/16 1634 04/18/16 2152 04/19/16 0816  GLUCAP 126* 192* 148* 181* 152*   Lipid Profile: No results for input(s): CHOL, HDL, LDLCALC, TRIG, CHOLHDL, LDLDIRECT in the last 72 hours. Thyroid Function Tests: No results for input(s): TSH, T4TOTAL, FREET4, T3FREE, THYROIDAB in the last 72 hours. Anemia Panel: No results for input(s): VITAMINB12, FOLATE, FERRITIN, TIBC, IRON, RETICCTPCT in the last 72 hours. Urine analysis:    Component Value Date/Time   COLORURINE YELLOW 04/16/2016 0205   APPEARANCEUR CLOUDY (A) 04/16/2016 0205   LABSPEC 1.021 04/16/2016 0205   PHURINE 5.0 04/16/2016 0205   GLUCOSEU >=500 (A) 04/16/2016 0205   HGBUR SMALL (A) 04/16/2016 0205   BILIRUBINUR NEGATIVE 04/16/2016 0205   KETONESUR NEGATIVE 04/16/2016 0205   PROTEINUR 30 (A) 04/16/2016 0205   UROBILINOGEN 0.2 07/22/2013 1703   NITRITE NEGATIVE 04/16/2016 0205   LEUKOCYTESUR MODERATE (A) 04/16/2016 0205     Troyce Febo M.D. [facility] 04/19/2016, 10:46 AM  Pager: [phone] Between 7am to 7pm - call Pager - [phone]  After 7pm go to [email or web address] - password [email or web address]  Call night coverage person covering after 7pm

## 2016-04-19 NOTE — Progress Notes (Signed)
New Admission Note: transfer from [facility]N  Arrival Method: Wheelchair Mental Orientation: a/o x4 Telemetry: placed Assessment: Completed Skin: clean dry intact IV: RFA  SL Pain: none Tubes: none Safety Measures: Safety Fall Prevention Plan has been given, discussed and signed Admission: Completed Unit Orientation: Patient has been orientated to the room, unit and staff.  Family: not present  Orders have been reviewed and implemented. Will continue to monitor the patient. Call light has been placed within reach and bed alarm has been activated.   Retta Mac BSN, RN

## 2016-04-19 NOTE — Progress Notes (Signed)
VASCULAR LAB PRELIMINARY  PRELIMINARY  PRELIMINARY  PRELIMINARY  Bilateral lower extremity venous duplex completed.    Preliminary report:  There is no obvious evidence of DVT or SVT noted in the bilateral lower extremities.  Andrea Wade, RVT 04/19/2016, 10:12 AM

## 2016-04-20 DIAGNOSIS — Z720 Tobacco use: Secondary | ICD-10-CM

## 2016-04-20 LAB — GLUCOSE, CAPILLARY
GLUCOSE-CAPILLARY: 106 mg/dL — AB (ref 65–99)
Glucose-Capillary: 160 mg/dL — ABNORMAL HIGH (ref 65–99)

## 2016-04-20 MED ORDER — BENZONATATE 100 MG PO CAPS: 100.0000 mg | ORAL_CAPSULE | Freq: Three times a day (TID) | ORAL | 0 refills | Status: DC

## 2016-04-20 MED ORDER — GUAIFENESIN-DM 100-10 MG/5ML PO SYRP: 5.0000 mL | ORAL_SOLUTION | ORAL | 0 refills | Status: DC | PRN

## 2016-04-20 MED ORDER — LEVOFLOXACIN 750 MG PO TABS: 750.0000 mg | ORAL_TABLET | Freq: Every day | ORAL | 0 refills | Status: DC

## 2016-04-20 MED ORDER — GUAIFENESIN ER 600 MG PO TB12: 600.0000 mg | ORAL_TABLET | Freq: Two times a day (BID) | ORAL | 3 refills | Status: DC

## 2016-04-20 MED ORDER — LEVOFLOXACIN 750 MG PO TABS
750.0000 mg | ORAL_TABLET | Freq: Every day | ORAL | Status: DC
  Administered 2016-04-20: 750 mg via ORAL
  Filled 2016-04-20: qty 1

## 2016-04-20 NOTE — Progress Notes (Signed)
PT Cancellation Note  Patient Details Name: Andrea Wade MRN: [id number] DOB: 1970-10-03   Cancelled Treatment:    Reason Eval/Treat Not Completed: Patient declined, no reason specified (Leaving for home today).  Will check on her tomorrow if discharge is held for some reason.   Ramond Dial 04/20/2016, 12:21 PM   Mee Hives, PT MS Acute Rehab Dept. Number: [place] [facility] [place]

## 2016-04-20 NOTE — Progress Notes (Signed)
Arnetha Gula to be D/C'd Home per MD order.  Discussed prescriptions and follow up appointments with the patient. Prescriptions given to patient, medication list explained in detail. Pt verbalized understanding.  Allergies as of 04/20/2016      Reactions   Codeine Hives, Itching, Palpitations   Peanut-containing Drug Products Anaphylaxis   Tape Other (See Comments)   Slight irritation   Aspirin Itching   High doses only-baby is ok   Penicillins Hives, Itching, Nausea And Vomiting   Allergy from childhood Has patient had a PCN reaction causing immediate rash, facial/tongue/throat swelling, SOB or lightheadedness with hypotension: Yes Has patient had a PCN reaction causing severe rash involving mucus membranes or skin necrosis: Unk Has patient had a PCN reaction that required hospitalization: Unk Has patient had a PCN reaction occurring within the last 10 years: No If all of the above answers are "NO", then may proceed with Cephalosporin use.      Medication List    TAKE these medications   APTIOM 800 MG Tabs Generic drug:  Eslicarbazepine Acetate Take 1,600 mg by mouth at bedtime.   baclofen 10 MG tablet Commonly known as:  LIORESAL Take 20 mg by mouth every 6 (six) hours.   benzonatate 100 MG capsule Commonly known as:  TESSALON Take 1 capsule (100 mg total) by mouth 3 (three) times daily. Cough   DULoxetine 60 MG capsule Commonly known as:  CYMBALTA Take 60 mg by mouth daily.   guaiFENesin 600 MG 12 hr tablet Commonly known as:  MUCINEX Take 1 tablet (600 mg total) by mouth 2 (two) times daily.   guaiFENesin-dextromethorphan 100-10 MG/5ML syrup Commonly known as:  ROBITUSSIN DM Take 5 mLs by mouth every 4 (four) hours as needed for cough.   HORIZANT 600 MG Tbcr Generic drug:  Gabapentin Enacarbil Take 600 mg by mouth 2 (two) times daily.   HYDROmorphone HCl 12 MG T24a SR tablet Commonly known as:  EXALGO Take 12 mg by mouth daily.   HYDROmorphone 8 MG  tablet Commonly known as:  DILAUDID Take 8 mg by mouth every 8 (eight) hours as needed (breakthrough pain).   insulin detemir 100 unit/ml Soln Commonly known as:  LEVEMIR Inject 40 Units into the skin at bedtime.   INVOKAMET [id number] MG Tabs Generic drug:  Canagliflozin-Metformin HCl Take 1 tablet by mouth 2 (two) times daily.   levofloxacin 750 MG tablet Commonly known as:  LEVAQUIN Take 1 tablet (750 mg total) by mouth daily. X 3 days   lisinopril 5 MG tablet Commonly known as:  PRINIVIL,ZESTRIL Take 1 tablet (5 mg total) by mouth daily.   nortriptyline 25 MG capsule Commonly known as:  PAMELOR Take 50 mg by mouth at bedtime.   NOVOLOG FLEXPEN 100 UNIT/ML FlexPen Generic drug:  insulin aspart Inject 16-24 Units into the skin 3 (three) times daily with meals. SLIDING SCALE   pantoprazole 40 MG tablet Commonly known as:  PROTONIX Take 1 tablet (40 mg total) by mouth daily.   promethazine 12.5 MG tablet Commonly known as:  PHENERGAN Take 12.5 mg by mouth 2 (two) times daily.   VIBERZI 75 MG Tabs Generic drug:  Eluxadoline Take 75 mg by mouth 2 (two) times daily.            Durable Medical Equipment        Start     Ordered   04/20/16 1033  For home use only DME oxygen  Once    Question Answer Comment  Mode or (  Route) Nasal cannula   Liters per Minute 3   Frequency Continuous (stationary and portable oxygen unit needed)   Oxygen conserving device Yes   Oxygen delivery system Gas      04/20/16 1032      Vitals:   04/20/16 0628 04/20/16 0945  BP: (!) 171/84 132/73  Pulse: 79 84  Resp: 17 18  Temp: 98.2 F (36.8 C) 98 F (36.7 C)    Skin clean, dry and intact without evidence of skin break down, no evidence of skin tears noted. IV catheter discontinued intact. Site without signs and symptoms of complications. Dressing and pressure applied. Pt denies pain at this time. No complaints noted. Medication returned to patient An After Visit Summary was  printed and given to the patient. Patient escorted via [place], and D/C home via private auto.  Retta Mac BSN, RN

## 2016-04-20 NOTE — Care Management Important Message (Signed)
Important Message  Patient Details  Name: Andrea Wade MRN: [id number] Date of Birth: [birth year]   Medicare Important Message Given:  Yes    Ella Bodo, RN 04/20/2016, 11:55 AM

## 2016-04-20 NOTE — Progress Notes (Signed)
Patient called me in to take a look at her IV. Patient running IV Vanc and it appeared IV infiltrated. I stopped the infusion immediately and notified MD. I removed the IV then measured the red elevated area (8x10 cm). I then brought an ice pack to the patient and educated her on applying ice every 20 mins the removing it for 20 mins. I also added IV infiltration education to patients exit care. Will continue to monitor patient.

## 2016-04-20 NOTE — Care Management Important Message (Signed)
Important Message  Patient Details  Name: Andrea Wade MRN: [id number] Date of Birth: 1970-05-06   Medicare Important Message Given:       Ella Bodo, RN 04/20/2016, 11:54 AM

## 2016-04-20 NOTE — Discharge Summary (Addendum)
Physician Discharge Summary   Patient ID: Andrea Wade MRN: [id number] DOB/AGE: [birth year] 47 y.o.  Admit date: 04/15/2016 Discharge date: 04/20/2016  Primary Care Physician:  Daphene Jaeger, PA-C  Discharge Diagnoses:     . Influenza .Multifocal HCAP (healthcare-associated pneumonia) . Hypotension . Acute Hypoxic respiratory failure ([place]) . Recent Pyelonephritis . Chronic pain syndrome . Tobacco abuse   Consults:  None  Recommendations for Outpatient Follow-up:  1. Patient declined home health PT 2. Please repeat CBC/BMET at next visit 3. Continue Levaquin for 3 more days, patient has completed full course of Tamiflu while in hospital 4. Patient will need repeat CT scan of the abdomen at the end of the month as per recommendations from the prior admission to ensure complete resolution of pyelonephritis.   DIET: Heart healthy diet   Allergies:   Allergies  Allergen Reactions  . Codeine Hives, Itching and Palpitations  . Peanut-Containing Drug Products Anaphylaxis  . Tape Other (See Comments)    Slight irritation  . Aspirin Itching    High doses only-baby is ok  . Penicillins Hives, Itching and Nausea And Vomiting    Allergy from childhood Has patient had a PCN reaction causing immediate rash, facial/tongue/throat swelling, SOB or lightheadedness with hypotension: Yes Has patient had a PCN reaction causing severe rash involving mucus membranes or skin necrosis: Unk Has patient had a PCN reaction that required hospitalization: Unk Has patient had a PCN reaction occurring within the last 10 years: No If all of the above answers are "NO", then may proceed with Cephalosporin use.       DISCHARGE MEDICATIONS: Current Discharge Medication List    START taking these medications   Details  benzonatate (TESSALON) 100 MG capsule Take 1 capsule (100 mg total) by mouth 3 (three) times daily. Cough Qty: 30 capsule, Refills: 0    guaiFENesin (MUCINEX) 600 MG 12 hr  tablet Take 1 tablet (600 mg total) by mouth 2 (two) times daily. Qty: 30 tablet, Refills: 3    guaiFENesin-dextromethorphan (ROBITUSSIN DM) 100-10 MG/5ML syrup Take 5 mLs by mouth every 4 (four) hours as needed for cough. Qty: 118 mL, Refills: 0    levofloxacin (LEVAQUIN) 750 MG tablet Take 1 tablet (750 mg total) by mouth daily. X 3 days Qty: 3 tablet, Refills: 0      CONTINUE these medications which have NOT CHANGED   Details  baclofen (LIORESAL) 10 MG tablet Take 20 mg by mouth every 6 (six) hours.    Canagliflozin-Metformin HCl (INVOKAMET) [phone] MG TABS Take 1 tablet by mouth 2 (two) times daily.    Eluxadoline (VIBERZI) 75 MG TABS Take 75 mg by mouth 2 (two) times daily.    Gabapentin Enacarbil (HORIZANT) 600 MG TBCR Take 600 mg by mouth 2 (two) times daily.     HYDROmorphone (DILAUDID) 8 MG tablet Take 8 mg by mouth every 8 (eight) hours as needed (breakthrough pain).    HYDROmorphone HCl (EXALGO) 12 MG T24A SR tablet Take 12 mg by mouth daily.    lisinopril (PRINIVIL,ZESTRIL) 5 MG tablet Take 1 tablet (5 mg total) by mouth daily. Qty: 30 tablet, Refills: 3    nortriptyline (PAMELOR) 25 MG capsule Take 50 mg by mouth at bedtime.     pantoprazole (PROTONIX) 40 MG tablet Take 1 tablet (40 mg total) by mouth daily. Qty: 30 tablet, Refills: 2   Associated Diagnoses: Gastroesophageal reflux disease without esophagitis    promethazine (PHENERGAN) 12.5 MG tablet Take 12.5 mg by mouth 2 (two) times  daily.    DULoxetine (CYMBALTA) 60 MG capsule Take 60 mg by mouth daily.    Eslicarbazepine Acetate (APTIOM) 800 MG TABS Take 1,600 mg by mouth at bedtime.    insulin aspart (NOVOLOG FLEXPEN) 100 UNIT/ML FlexPen Inject 16-24 Units into the skin 3 (three) times daily with meals. SLIDING SCALE    insulin detemir (LEVEMIR) 100 unit/ml SOLN Inject 40 Units into the skin at bedtime.         Brief H and P: For complete details please refer to admission H and P, but in briefThe  patient is a 46 year old female with diabetes, chronic pain syndrome, tobacco abuse presented with a productive cough for last 2 days with yellowish sputum. Patient also reported headache, sore throat, wheezing, fever up to 101F, chills, body aches, nausea vomiting and diarrhea.Patient was just recently hospitalized from 1/27- 1/34 pyelonephritis which patient reports completing a 14 day course of oral ciprofloxacin. In ED, patient was febrile up to 287G, hr 811, systolic BP in 57W, O2 sats 87%. Chest x-ray showed right lower lobe infiltrates. She was started on Tamiflu and antibiotics.  Hospital Course:  Influenza/healthcare associated pneumonia/Multifocal pneumonia: - Patient met SIRS criteria secondary to hypotension, tachycardia, hypoxia, source pneumonia, influenza - Patient was sent here from urgent care at [facility] and was told she had pneumonia and positive flu test - Patient was placed on IV vancomycin, IV cefepime, Tamiflu.  she was transitioned to oral Levaquin at the time of discharge. She has completed full course of Tamiflu at the time of discharge.  - Urine strep antigen negative - BNP 29.2, CT angiogram of this chest showed multifocal pneumonia, no PE - Doppler ultrasound of the lower legs showed no DVT - Patient is back to her baseline. Home O2 evaluation will be done prior to discharge. - PT evaluation was done and recommended home health physical therapy however patient declined at this time.   Hypotension: Systolic blood pressures noted to be as low as the 80s on admission due to #1  - BP currently stable, 132/73 at the time of discharge.   Acute respiratory failure with hypoxia: Initial O2 saturations noted to be stable was 87% on room air. -   Patient was placed on scheduled nebs, O2 as tolerated  UTI/history of pyelonephritis: Question of continuation of previous hospitalization infection. Patient reports taking ciprofloxacin. A repeat CT scan of the abdomen is  recommended in 4 weeks to check for resolution of pyelonephritis. She was placed on IV vancomycin and cefepime during this admission.  - Urine culture showed  only 60,000 colonies of staph aureus - Patient finished her full course of pyelonephritis treatment on 04/16/16.   Diabetes mellitus type 2: Last hemoglobin A1c noted was 7.6 and 10/2015. - patient was placed on sliding scale insulin while inpatient   Chronic pain - Continue baclofen, amitriptyline, Dilaudid per her home schedule (discussed with pharmacy)  Chronic Anemia: Hemoglobin was noted to be 10.5 on admission. Patient's baseline hemoglobin is somewhere around 9. - Hemoglobin currently at baseline  Tobacco abuse: Patient reports quitting tobacco 2 days. declines need of a nicotine patch will hospitalized. - counseled on the need of cessation of tobacco   Day of Discharge BP 132/73 (BP Location: Left Arm)   Pulse 84   Temp 98 F (36.7 C) (Oral)   Resp 18   Ht _0  (1.753 m)   Wt 112.1 kg (247 lb 1.6 oz)   LMP 04/02/2016   SpO2 97%   BMI 36.49  kg/m   Physical Exam: General: Alert and awake oriented x3 not in any acute distress. HEENT: anicteric sclera, pupils reactive to light and accommodation CVS: S1-S2 clear no murmur rubs or gallops Chest: clear to auscultation bilaterally, no wheezing rales or rhonchi Abdomen: soft nontender, nondistended, normal bowel sounds Extremities: no cyanosis, clubbing or edema noted bilaterally Neuro: Cranial nerves II-XII intact, no focal neurological deficits   The results of significant diagnostics from this hospitalization (including imaging, microbiology, ancillary and laboratory) are listed below for reference.    LAB RESULTS: Basic Metabolic Panel:  Recent Labs Lab 04/18/16 0330 04/19/16 0233  NA 139 138  K 3.6 3.5  CL 105 103  CO2 26 28  GLUCOSE 120* 155*  BUN <5* <5*  CREATININE 0.46 0.48  CALCIUM 8.2* 8.4*   Liver Function Tests:  Recent Labs Lab  04/15/16 1643  AST 33  ALT 24  ALKPHOS 82  BILITOT 0.4  PROT 8.3*  ALBUMIN 3.4*   No results for input(s): LIPASE, AMYLASE in the last 168 hours. No results for input(s): AMMONIA in the last 168 hours. CBC:  Recent Labs Lab 04/15/16 1643  04/18/16 0330 04/19/16 0233  WBC 7.6  < > 5.2 5.1  NEUTROABS 5.7  --   --   --   HGB 10.5*  < > 8.9* 9.5*  HCT 35.2*  < > 31.5* 32.2*  MCV 63.9*  < > 65.6* 65.8*  PLT 479*  < > 287 268  < > = values in this interval not displayed. Cardiac Enzymes: No results for input(s): CKTOTAL, CKMB, CKMBINDEX, TROPONINI in the last 168 hours. BNP: Invalid input(s): POCBNP CBG:  Recent Labs Lab 04/19/16 2120 04/20/16 0752  GLUCAP 184* 160*    Significant Diagnostic Studies:  Dg Chest 2 View  Result Date: 04/15/2016 CLINICAL DATA:  Pneumonia, EXAM: CHEST  2 VIEW COMPARISON:  Chest radiograph 12/01/2015 FINDINGS: Normal cardiac silhouette. There is subtle RIGHT infrahilar density over the cardiophrenic angle. No pneumothorax. No pleural fluid. IMPRESSION: Potential RIGHT lower lobe pneumonia. Atelectasis could have a similar pattern. Electronically Signed   By: Suzy Bouchard M.D.   On: 04/15/2016 17:15    2D ECHO:   Disposition and Follow-up: Discharge Instructions    Diet Carb Modified    Complete by:  As directed    Increase activity slowly    Complete by:  As directed        DISPOSHome. PT evaluation was done, recommended home health PT however the patient declined.  DISCHARGE FOLLOW-UP Follow-up Information    Vincente Poli, [place] Follow up on 05/04/2016.   Specialty:  Physician Assistant Why:  APPOINTMENT: Monday, May 04, 2016 at 10:30 am Contact information: [address] [address] [phone]            Time spent on Discharge: 40 MINS     Signed:   Ariea Rochin M.D. [facility] 04/20/2016, 10:25 AM Pager: [phone]   Coding query Sepsis ruled out. Patient met SIRS criteria  secondary to hypotension, tachycardia, hypoxia, source pneumonia, influenza  Rickesha Veracruz M.D. [facility] 04/28/2016, 5:04 PM  Pager: [phone]

## 2016-04-20 NOTE — Progress Notes (Signed)
Pt for discharge home today; she states she lives with her mother and her fiance.  Pt states she is feeling better and has been up without assistance in her room.  Discussed PT's recommendation for home health follow up; pt politely declines home health at this time.  She states she has had HHPT in the past, and it was not very helpful for her.  She states she still has the handouts that home therapist gave her, and can do recommended exercises at home.  Pt states that someone will be with her at all times at home.    Reinaldo Raddle, RN, BSN  Trauma/Neuro ICU Case Manager [phone]

## 2016-04-20 NOTE — Progress Notes (Signed)
SATURATION QUALIFICATIONS: (This note is used to comply with regulatory documentation for home oxygen)  Patient Saturations on Room Air at Rest = 96%  Patient Saturations on Room Air while Ambulating = 96-98%  Patient Saturations on 0 Liters of oxygen while Ambulating = 96-98%  Please briefly explain why patient needs home oxygen:N/A  Patient states her breathing is fine she just feels weak and unable to walk very far. I asked her if she feels her strength is at baseline and she said "yes."

## 2016-04-28 DIAGNOSIS — E1165 Type 2 diabetes mellitus with hyperglycemia: Secondary | ICD-10-CM | POA: Diagnosis not present

## 2016-04-28 DIAGNOSIS — E669 Obesity, unspecified: Secondary | ICD-10-CM | POA: Diagnosis not present

## 2016-04-28 DIAGNOSIS — Z794 Long term (current) use of insulin: Secondary | ICD-10-CM | POA: Diagnosis not present

## 2016-04-28 DIAGNOSIS — E1129 Type 2 diabetes mellitus with other diabetic kidney complication: Secondary | ICD-10-CM | POA: Diagnosis not present

## 2016-04-28 DIAGNOSIS — R809 Proteinuria, unspecified: Secondary | ICD-10-CM | POA: Diagnosis not present

## 2016-05-14 DIAGNOSIS — R69 Illness, unspecified: Secondary | ICD-10-CM | POA: Diagnosis not present

## 2016-05-14 DIAGNOSIS — F1120 Opioid dependence, uncomplicated: Secondary | ICD-10-CM | POA: Diagnosis not present

## 2016-05-14 DIAGNOSIS — Z79891 Long term (current) use of opiate analgesic: Secondary | ICD-10-CM | POA: Diagnosis not present

## 2016-05-14 DIAGNOSIS — G894 Chronic pain syndrome: Secondary | ICD-10-CM | POA: Diagnosis not present

## 2016-05-14 DIAGNOSIS — M5136 Other intervertebral disc degeneration, lumbar region: Secondary | ICD-10-CM | POA: Diagnosis not present

## 2016-05-14 DIAGNOSIS — Z720 Tobacco use: Secondary | ICD-10-CM | POA: Diagnosis not present

## 2016-05-14 DIAGNOSIS — M5416 Radiculopathy, lumbar region: Secondary | ICD-10-CM | POA: Diagnosis not present

## 2016-05-14 DIAGNOSIS — M5030 Other cervical disc degeneration, unspecified cervical region: Secondary | ICD-10-CM | POA: Diagnosis not present

## 2016-05-14 DIAGNOSIS — G629 Polyneuropathy, unspecified: Secondary | ICD-10-CM | POA: Diagnosis not present

## 2016-05-14 DIAGNOSIS — E119 Type 2 diabetes mellitus without complications: Secondary | ICD-10-CM | POA: Diagnosis not present

## 2016-05-14 DIAGNOSIS — M5134 Other intervertebral disc degeneration, thoracic region: Secondary | ICD-10-CM | POA: Diagnosis not present

## 2016-06-01 DIAGNOSIS — K580 Irritable bowel syndrome with diarrhea: Secondary | ICD-10-CM | POA: Diagnosis not present

## 2016-06-01 DIAGNOSIS — Z7984 Long term (current) use of oral hypoglycemic drugs: Secondary | ICD-10-CM | POA: Diagnosis not present

## 2016-06-01 DIAGNOSIS — R600 Localized edema: Secondary | ICD-10-CM | POA: Diagnosis not present

## 2016-06-01 DIAGNOSIS — G35 Multiple sclerosis: Secondary | ICD-10-CM | POA: Diagnosis not present

## 2016-06-01 DIAGNOSIS — Z7982 Long term (current) use of aspirin: Secondary | ICD-10-CM | POA: Diagnosis not present

## 2016-06-01 DIAGNOSIS — K210 Gastro-esophageal reflux disease with esophagitis: Secondary | ICD-10-CM | POA: Diagnosis not present

## 2016-06-01 DIAGNOSIS — R42 Dizziness and giddiness: Secondary | ICD-10-CM | POA: Diagnosis not present

## 2016-06-01 DIAGNOSIS — M059 Rheumatoid arthritis with rheumatoid factor, unspecified: Secondary | ICD-10-CM | POA: Diagnosis not present

## 2016-06-01 DIAGNOSIS — G40909 Epilepsy, unspecified, not intractable, without status epilepticus: Secondary | ICD-10-CM | POA: Diagnosis not present

## 2016-06-01 DIAGNOSIS — Z794 Long term (current) use of insulin: Secondary | ICD-10-CM | POA: Diagnosis not present

## 2016-06-01 DIAGNOSIS — E1165 Type 2 diabetes mellitus with hyperglycemia: Secondary | ICD-10-CM | POA: Diagnosis not present

## 2016-06-01 DIAGNOSIS — E1140 Type 2 diabetes mellitus with diabetic neuropathy, unspecified: Secondary | ICD-10-CM | POA: Diagnosis not present

## 2016-06-01 DIAGNOSIS — Z6835 Body mass index (BMI) 35.0-35.9, adult: Secondary | ICD-10-CM | POA: Diagnosis not present

## 2016-06-01 DIAGNOSIS — Z0000 Encounter for general adult medical examination without abnormal findings: Secondary | ICD-10-CM | POA: Diagnosis not present

## 2016-06-01 DIAGNOSIS — R69 Illness, unspecified: Secondary | ICD-10-CM | POA: Diagnosis not present

## 2016-06-15 DIAGNOSIS — M5416 Radiculopathy, lumbar region: Secondary | ICD-10-CM | POA: Diagnosis not present

## 2016-06-15 DIAGNOSIS — M5134 Other intervertebral disc degeneration, thoracic region: Secondary | ICD-10-CM | POA: Diagnosis not present

## 2016-06-15 DIAGNOSIS — Z720 Tobacco use: Secondary | ICD-10-CM | POA: Diagnosis not present

## 2016-06-15 DIAGNOSIS — E119 Type 2 diabetes mellitus without complications: Secondary | ICD-10-CM | POA: Diagnosis not present

## 2016-06-15 DIAGNOSIS — Z79891 Long term (current) use of opiate analgesic: Secondary | ICD-10-CM | POA: Diagnosis not present

## 2016-06-15 DIAGNOSIS — G894 Chronic pain syndrome: Secondary | ICD-10-CM | POA: Diagnosis not present

## 2016-06-15 DIAGNOSIS — G35 Multiple sclerosis: Secondary | ICD-10-CM | POA: Diagnosis not present

## 2016-06-15 DIAGNOSIS — M5030 Other cervical disc degeneration, unspecified cervical region: Secondary | ICD-10-CM | POA: Diagnosis not present

## 2016-06-15 DIAGNOSIS — G629 Polyneuropathy, unspecified: Secondary | ICD-10-CM | POA: Diagnosis not present

## 2016-06-15 DIAGNOSIS — M5136 Other intervertebral disc degeneration, lumbar region: Secondary | ICD-10-CM | POA: Diagnosis not present

## 2016-07-06 DIAGNOSIS — E1165 Type 2 diabetes mellitus with hyperglycemia: Secondary | ICD-10-CM | POA: Diagnosis not present

## 2016-07-15 DIAGNOSIS — R69 Illness, unspecified: Secondary | ICD-10-CM | POA: Diagnosis not present

## 2016-07-15 DIAGNOSIS — M5136 Other intervertebral disc degeneration, lumbar region: Secondary | ICD-10-CM | POA: Diagnosis not present

## 2016-07-15 DIAGNOSIS — G894 Chronic pain syndrome: Secondary | ICD-10-CM | POA: Diagnosis not present

## 2016-07-15 DIAGNOSIS — M5134 Other intervertebral disc degeneration, thoracic region: Secondary | ICD-10-CM | POA: Diagnosis not present

## 2016-07-15 DIAGNOSIS — M5416 Radiculopathy, lumbar region: Secondary | ICD-10-CM | POA: Diagnosis not present

## 2016-07-15 DIAGNOSIS — E119 Type 2 diabetes mellitus without complications: Secondary | ICD-10-CM | POA: Diagnosis not present

## 2016-07-15 DIAGNOSIS — Z720 Tobacco use: Secondary | ICD-10-CM | POA: Diagnosis not present

## 2016-07-15 DIAGNOSIS — Z79891 Long term (current) use of opiate analgesic: Secondary | ICD-10-CM | POA: Diagnosis not present

## 2016-07-15 DIAGNOSIS — G629 Polyneuropathy, unspecified: Secondary | ICD-10-CM | POA: Diagnosis not present

## 2016-07-15 DIAGNOSIS — M5030 Other cervical disc degeneration, unspecified cervical region: Secondary | ICD-10-CM | POA: Diagnosis not present

## 2016-07-17 DIAGNOSIS — R69 Illness, unspecified: Secondary | ICD-10-CM | POA: Diagnosis not present

## 2016-08-13 DIAGNOSIS — G894 Chronic pain syndrome: Secondary | ICD-10-CM | POA: Diagnosis not present

## 2016-08-13 DIAGNOSIS — G629 Polyneuropathy, unspecified: Secondary | ICD-10-CM | POA: Diagnosis not present

## 2016-08-13 DIAGNOSIS — Z720 Tobacco use: Secondary | ICD-10-CM | POA: Diagnosis not present

## 2016-08-13 DIAGNOSIS — M5416 Radiculopathy, lumbar region: Secondary | ICD-10-CM | POA: Diagnosis not present

## 2016-08-13 DIAGNOSIS — M5134 Other intervertebral disc degeneration, thoracic region: Secondary | ICD-10-CM | POA: Diagnosis not present

## 2016-08-13 DIAGNOSIS — Z79891 Long term (current) use of opiate analgesic: Secondary | ICD-10-CM | POA: Diagnosis not present

## 2016-08-13 DIAGNOSIS — R69 Illness, unspecified: Secondary | ICD-10-CM | POA: Diagnosis not present

## 2016-08-13 DIAGNOSIS — M5136 Other intervertebral disc degeneration, lumbar region: Secondary | ICD-10-CM | POA: Diagnosis not present

## 2016-08-13 DIAGNOSIS — E119 Type 2 diabetes mellitus without complications: Secondary | ICD-10-CM | POA: Diagnosis not present

## 2016-08-13 DIAGNOSIS — M5030 Other cervical disc degeneration, unspecified cervical region: Secondary | ICD-10-CM | POA: Diagnosis not present

## 2016-08-14 DIAGNOSIS — R69 Illness, unspecified: Secondary | ICD-10-CM | POA: Diagnosis not present

## 2016-09-09 DIAGNOSIS — M5416 Radiculopathy, lumbar region: Secondary | ICD-10-CM | POA: Diagnosis not present

## 2016-09-09 DIAGNOSIS — G894 Chronic pain syndrome: Secondary | ICD-10-CM | POA: Diagnosis not present

## 2016-09-09 DIAGNOSIS — G629 Polyneuropathy, unspecified: Secondary | ICD-10-CM | POA: Diagnosis not present

## 2016-09-09 DIAGNOSIS — E119 Type 2 diabetes mellitus without complications: Secondary | ICD-10-CM | POA: Diagnosis not present

## 2016-09-09 DIAGNOSIS — Z79891 Long term (current) use of opiate analgesic: Secondary | ICD-10-CM | POA: Diagnosis not present

## 2016-09-09 DIAGNOSIS — R69 Illness, unspecified: Secondary | ICD-10-CM | POA: Diagnosis not present

## 2016-09-09 DIAGNOSIS — M5030 Other cervical disc degeneration, unspecified cervical region: Secondary | ICD-10-CM | POA: Diagnosis not present

## 2016-09-09 DIAGNOSIS — Z720 Tobacco use: Secondary | ICD-10-CM | POA: Diagnosis not present

## 2016-09-09 DIAGNOSIS — M5134 Other intervertebral disc degeneration, thoracic region: Secondary | ICD-10-CM | POA: Diagnosis not present

## 2016-09-09 DIAGNOSIS — M5136 Other intervertebral disc degeneration, lumbar region: Secondary | ICD-10-CM | POA: Diagnosis not present

## 2016-09-20 DIAGNOSIS — L270 Generalized skin eruption due to drugs and medicaments taken internally: Secondary | ICD-10-CM | POA: Diagnosis not present

## 2016-09-20 DIAGNOSIS — R0602 Shortness of breath: Secondary | ICD-10-CM | POA: Diagnosis not present

## 2016-09-20 DIAGNOSIS — T50905A Adverse effect of unspecified drugs, medicaments and biological substances, initial encounter: Secondary | ICD-10-CM | POA: Diagnosis not present

## 2016-09-20 DIAGNOSIS — T426X5A Adverse effect of other antiepileptic and sedative-hypnotic drugs, initial encounter: Secondary | ICD-10-CM | POA: Diagnosis not present

## 2016-09-24 DIAGNOSIS — E1165 Type 2 diabetes mellitus with hyperglycemia: Secondary | ICD-10-CM | POA: Diagnosis not present

## 2016-10-07 DIAGNOSIS — L500 Allergic urticaria: Secondary | ICD-10-CM | POA: Diagnosis not present

## 2016-10-07 DIAGNOSIS — R220 Localized swelling, mass and lump, head: Secondary | ICD-10-CM | POA: Diagnosis not present

## 2016-10-07 DIAGNOSIS — T7801XA Anaphylactic reaction due to peanuts, initial encounter: Secondary | ICD-10-CM | POA: Diagnosis not present

## 2016-10-07 DIAGNOSIS — R21 Rash and other nonspecific skin eruption: Secondary | ICD-10-CM | POA: Diagnosis not present

## 2016-10-07 DIAGNOSIS — T7840XA Allergy, unspecified, initial encounter: Secondary | ICD-10-CM | POA: Diagnosis not present

## 2016-10-12 DIAGNOSIS — G894 Chronic pain syndrome: Secondary | ICD-10-CM | POA: Diagnosis not present

## 2016-10-12 DIAGNOSIS — Z79891 Long term (current) use of opiate analgesic: Secondary | ICD-10-CM | POA: Diagnosis not present

## 2016-10-12 DIAGNOSIS — G629 Polyneuropathy, unspecified: Secondary | ICD-10-CM | POA: Diagnosis not present

## 2016-10-12 DIAGNOSIS — M5030 Other cervical disc degeneration, unspecified cervical region: Secondary | ICD-10-CM | POA: Diagnosis not present

## 2016-10-12 DIAGNOSIS — M5134 Other intervertebral disc degeneration, thoracic region: Secondary | ICD-10-CM | POA: Diagnosis not present

## 2016-10-12 DIAGNOSIS — M5136 Other intervertebral disc degeneration, lumbar region: Secondary | ICD-10-CM | POA: Diagnosis not present

## 2016-10-12 DIAGNOSIS — Z720 Tobacco use: Secondary | ICD-10-CM | POA: Diagnosis not present

## 2016-10-12 DIAGNOSIS — R69 Illness, unspecified: Secondary | ICD-10-CM | POA: Diagnosis not present

## 2016-10-12 DIAGNOSIS — E119 Type 2 diabetes mellitus without complications: Secondary | ICD-10-CM | POA: Diagnosis not present

## 2016-10-12 DIAGNOSIS — M5416 Radiculopathy, lumbar region: Secondary | ICD-10-CM | POA: Diagnosis not present

## 2016-10-27 DIAGNOSIS — E1165 Type 2 diabetes mellitus with hyperglycemia: Secondary | ICD-10-CM | POA: Diagnosis not present

## 2016-10-27 DIAGNOSIS — R809 Proteinuria, unspecified: Secondary | ICD-10-CM | POA: Diagnosis not present

## 2016-10-27 DIAGNOSIS — Z794 Long term (current) use of insulin: Secondary | ICD-10-CM | POA: Diagnosis not present

## 2016-10-27 DIAGNOSIS — E1129 Type 2 diabetes mellitus with other diabetic kidney complication: Secondary | ICD-10-CM | POA: Diagnosis not present

## 2016-10-27 DIAGNOSIS — E669 Obesity, unspecified: Secondary | ICD-10-CM | POA: Diagnosis not present

## 2016-11-09 DIAGNOSIS — M5136 Other intervertebral disc degeneration, lumbar region: Secondary | ICD-10-CM | POA: Diagnosis not present

## 2016-11-09 DIAGNOSIS — F1120 Opioid dependence, uncomplicated: Secondary | ICD-10-CM | POA: Diagnosis not present

## 2016-11-09 DIAGNOSIS — E119 Type 2 diabetes mellitus without complications: Secondary | ICD-10-CM | POA: Diagnosis not present

## 2016-11-09 DIAGNOSIS — Z720 Tobacco use: Secondary | ICD-10-CM | POA: Diagnosis not present

## 2016-11-09 DIAGNOSIS — M5416 Radiculopathy, lumbar region: Secondary | ICD-10-CM | POA: Diagnosis not present

## 2016-11-09 DIAGNOSIS — Z79891 Long term (current) use of opiate analgesic: Secondary | ICD-10-CM | POA: Diagnosis not present

## 2016-11-09 DIAGNOSIS — R69 Illness, unspecified: Secondary | ICD-10-CM | POA: Diagnosis not present

## 2016-11-09 DIAGNOSIS — G629 Polyneuropathy, unspecified: Secondary | ICD-10-CM | POA: Diagnosis not present

## 2016-11-09 DIAGNOSIS — G894 Chronic pain syndrome: Secondary | ICD-10-CM | POA: Diagnosis not present

## 2016-11-09 DIAGNOSIS — M5030 Other cervical disc degeneration, unspecified cervical region: Secondary | ICD-10-CM | POA: Diagnosis not present

## 2016-11-09 DIAGNOSIS — M5134 Other intervertebral disc degeneration, thoracic region: Secondary | ICD-10-CM | POA: Diagnosis not present

## 2016-11-18 DIAGNOSIS — M069 Rheumatoid arthritis, unspecified: Secondary | ICD-10-CM | POA: Diagnosis not present

## 2016-11-18 DIAGNOSIS — R51 Headache: Secondary | ICD-10-CM | POA: Diagnosis not present

## 2016-11-18 DIAGNOSIS — R569 Unspecified convulsions: Secondary | ICD-10-CM | POA: Diagnosis not present

## 2016-11-20 DIAGNOSIS — R69 Illness, unspecified: Secondary | ICD-10-CM | POA: Diagnosis not present

## 2016-11-20 DIAGNOSIS — M069 Rheumatoid arthritis, unspecified: Secondary | ICD-10-CM | POA: Diagnosis not present

## 2016-11-20 DIAGNOSIS — K219 Gastro-esophageal reflux disease without esophagitis: Secondary | ICD-10-CM | POA: Diagnosis not present

## 2016-11-20 DIAGNOSIS — Z6837 Body mass index (BMI) 37.0-37.9, adult: Secondary | ICD-10-CM | POA: Diagnosis not present

## 2016-11-20 DIAGNOSIS — G988 Other disorders of nervous system: Secondary | ICD-10-CM | POA: Diagnosis not present

## 2016-11-20 DIAGNOSIS — E669 Obesity, unspecified: Secondary | ICD-10-CM | POA: Diagnosis not present

## 2016-11-20 DIAGNOSIS — I10 Essential (primary) hypertension: Secondary | ICD-10-CM | POA: Diagnosis not present

## 2016-11-20 DIAGNOSIS — E1165 Type 2 diabetes mellitus with hyperglycemia: Secondary | ICD-10-CM | POA: Diagnosis not present

## 2016-11-20 DIAGNOSIS — G8929 Other chronic pain: Secondary | ICD-10-CM | POA: Diagnosis not present

## 2016-11-25 DIAGNOSIS — M62838 Other muscle spasm: Secondary | ICD-10-CM | POA: Diagnosis not present

## 2016-12-02 ENCOUNTER — Other Ambulatory Visit: Payer: Self-pay | Admitting: Anesthesiology

## 2016-12-02 DIAGNOSIS — M62838 Other muscle spasm: Secondary | ICD-10-CM

## 2016-12-13 ENCOUNTER — Other Ambulatory Visit: Payer: Medicare HMO

## 2016-12-18 DIAGNOSIS — M5416 Radiculopathy, lumbar region: Secondary | ICD-10-CM | POA: Diagnosis not present

## 2016-12-18 DIAGNOSIS — Z79891 Long term (current) use of opiate analgesic: Secondary | ICD-10-CM | POA: Diagnosis not present

## 2016-12-18 DIAGNOSIS — M5030 Other cervical disc degeneration, unspecified cervical region: Secondary | ICD-10-CM | POA: Diagnosis not present

## 2016-12-18 DIAGNOSIS — G629 Polyneuropathy, unspecified: Secondary | ICD-10-CM | POA: Diagnosis not present

## 2016-12-18 DIAGNOSIS — M5136 Other intervertebral disc degeneration, lumbar region: Secondary | ICD-10-CM | POA: Diagnosis not present

## 2016-12-18 DIAGNOSIS — M5134 Other intervertebral disc degeneration, thoracic region: Secondary | ICD-10-CM | POA: Diagnosis not present

## 2016-12-18 DIAGNOSIS — R69 Illness, unspecified: Secondary | ICD-10-CM | POA: Diagnosis not present

## 2016-12-18 DIAGNOSIS — G894 Chronic pain syndrome: Secondary | ICD-10-CM | POA: Diagnosis not present

## 2016-12-18 DIAGNOSIS — E119 Type 2 diabetes mellitus without complications: Secondary | ICD-10-CM | POA: Diagnosis not present

## 2016-12-18 DIAGNOSIS — Z720 Tobacco use: Secondary | ICD-10-CM | POA: Diagnosis not present

## 2016-12-26 ENCOUNTER — Ambulatory Visit
Admission: RE | Admit: 2016-12-26 | Discharge: 2016-12-26 | Disposition: A | Discharge status: Home / Self Care | Payer: Medicare HMO | Source: Ambulatory Visit | Attending: Anesthesiology | Admitting: Anesthesiology

## 2016-12-26 DIAGNOSIS — M62838 Other muscle spasm: Secondary | ICD-10-CM

## 2016-12-26 DIAGNOSIS — S22010A Wedge compression fracture of first thoracic vertebra, initial encounter for closed fracture: Secondary | ICD-10-CM | POA: Diagnosis not present

## 2016-12-26 DIAGNOSIS — M50223 Other cervical disc displacement at C6-C7 level: Secondary | ICD-10-CM | POA: Diagnosis not present

## 2016-12-26 DIAGNOSIS — R51 Headache: Secondary | ICD-10-CM | POA: Diagnosis not present

## 2017-01-12 DIAGNOSIS — M5134 Other intervertebral disc degeneration, thoracic region: Secondary | ICD-10-CM | POA: Diagnosis not present

## 2017-01-12 DIAGNOSIS — Z720 Tobacco use: Secondary | ICD-10-CM | POA: Diagnosis not present

## 2017-01-12 DIAGNOSIS — M5416 Radiculopathy, lumbar region: Secondary | ICD-10-CM | POA: Diagnosis not present

## 2017-01-12 DIAGNOSIS — R69 Illness, unspecified: Secondary | ICD-10-CM | POA: Diagnosis not present

## 2017-01-12 DIAGNOSIS — Z79891 Long term (current) use of opiate analgesic: Secondary | ICD-10-CM | POA: Diagnosis not present

## 2017-01-12 DIAGNOSIS — G894 Chronic pain syndrome: Secondary | ICD-10-CM | POA: Diagnosis not present

## 2017-01-12 DIAGNOSIS — G629 Polyneuropathy, unspecified: Secondary | ICD-10-CM | POA: Diagnosis not present

## 2017-01-12 DIAGNOSIS — M25532 Pain in left wrist: Secondary | ICD-10-CM | POA: Diagnosis not present

## 2017-01-12 DIAGNOSIS — M5136 Other intervertebral disc degeneration, lumbar region: Secondary | ICD-10-CM | POA: Diagnosis not present

## 2017-01-12 DIAGNOSIS — M19031 Primary osteoarthritis, right wrist: Secondary | ICD-10-CM | POA: Diagnosis not present

## 2017-01-12 DIAGNOSIS — E119 Type 2 diabetes mellitus without complications: Secondary | ICD-10-CM | POA: Diagnosis not present

## 2017-01-27 DIAGNOSIS — M62838 Other muscle spasm: Secondary | ICD-10-CM | POA: Diagnosis not present

## 2017-02-03 DIAGNOSIS — E1165 Type 2 diabetes mellitus with hyperglycemia: Secondary | ICD-10-CM | POA: Diagnosis not present

## 2017-02-04 DIAGNOSIS — E1165 Type 2 diabetes mellitus with hyperglycemia: Secondary | ICD-10-CM | POA: Diagnosis not present

## 2017-02-06 ENCOUNTER — Emergency Department (HOSPITAL_COMMUNITY)
Admission: EM | Admit: 2017-02-06 | Discharge: 2017-02-07 | Disposition: A | Discharge status: Home / Self Care | Payer: Medicare HMO | Attending: Emergency Medicine | Admitting: Emergency Medicine

## 2017-02-06 DIAGNOSIS — Z79899 Other long term (current) drug therapy: Secondary | ICD-10-CM | POA: Insufficient documentation

## 2017-02-06 DIAGNOSIS — Z91010 Allergy to peanuts: Secondary | ICD-10-CM | POA: Diagnosis not present

## 2017-02-06 DIAGNOSIS — E119 Type 2 diabetes mellitus without complications: Secondary | ICD-10-CM | POA: Diagnosis not present

## 2017-02-06 DIAGNOSIS — T7840XA Allergy, unspecified, initial encounter: Secondary | ICD-10-CM | POA: Diagnosis not present

## 2017-02-06 DIAGNOSIS — Z794 Long term (current) use of insulin: Secondary | ICD-10-CM | POA: Insufficient documentation

## 2017-02-06 DIAGNOSIS — F17210 Nicotine dependence, cigarettes, uncomplicated: Secondary | ICD-10-CM | POA: Insufficient documentation

## 2017-02-06 DIAGNOSIS — R69 Illness, unspecified: Secondary | ICD-10-CM | POA: Diagnosis not present

## 2017-02-06 DIAGNOSIS — R069 Unspecified abnormalities of breathing: Secondary | ICD-10-CM | POA: Diagnosis not present

## 2017-02-06 NOTE — ED Provider Notes (Signed)
[facility] DEPT Provider Note   CSN: [id number] Arrival date & time: 02/06/17  2110   History   Chief Complaint Chief Complaint  Patient presents with  . Shortness of Breath    HPI Andrea Wade is a 46 y.o. female.  HPI   46 year old female presents today with complaints of allergic reaction.  Patient reports this morning she started to feel like her throat was closing.  She notes that she does have a history of anaphylactic reaction to peanuts, reports she has not been eating peanuts.  She notes this slowly developed through the day to the point this afternoon when her husband called EMS.  Patient has been thought she was having a panic attack, but wanted to verify the EMS.  EMS was called, epinephrine was given.  Patient reports abnormal sensation her throat presently, denies any significant shortness of breath, denies any abdominal pain, rash.     Past Medical History:  Diagnosis Date  . Anemia   . Anxiety   . Arthritis    "joints" (04/17/2013)  . Chronic back pain    "all over" (04/17/2013)  . Crohn disease ([place])   . Daily headache   . Depression   . Diabetes type 2, uncontrolled ([place])   . 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 GERD (gastroesophageal reflux disease)   . Migraine    "q day" (04/17/2013)  . MS (multiple sclerosis) ([place])   . Pneumonia    "about 10 times in my lifetime" (04/17/2013)    Patient Active Problem List   Diagnosis Date Noted  . Influenza 04/16/2016  . HCAP (healthcare-associated pneumonia) 04/16/2016  . Hypotension 04/16/2016  . Acute respiratory failure ([place]) 04/16/2016  . Sepsis ([place]) 04/15/2016  . Pyelonephritis 03/29/2016  . Chronic pain syndrome 03/29/2016  . Hypochloremia   . Hyponatremia   . Diabetes mellitus due to underlying condition without complications ([place]) 53/64/6803  . Anemia, unspecified 10/05/2013  . Hypokalemia 10/05/2013  . Smoking 10/05/2013  .  Menorrhagia with regular cycle 10/05/2013  . Esophageal reflux 10/05/2013  . Chest pain 04/17/2013  . Seizure ([place]) 02/24/2013  . Atypical chest pain 09/22/2012  . Noncompliance 09/22/2012  . Tobacco abuse 09/22/2012  . DM2 (diabetes mellitus, type 2) ([place]) 09/22/2012  . Protein-calorie malnutrition, severe ([place]) 09/22/2012    Past Surgical History:  Procedure Laterality Date  . CARDIAC CATHETERIZATION N/A 10/08/2015   Procedure: Left Heart Cath and Coronary Angiography;  Surgeon: Peter M Martinique, MD;  Location: [facility];  Service: Cardiovascular;  Laterality: N/A;  . CHOLECYSTECTOMY  1994  . TUBAL LIGATION  1993    OB History    No data available       Home Medications    Prior to Admission medications   Medication Sig Start Date End Date Taking? Authorizing Provider  baclofen (LIORESAL) 10 MG tablet Take 20 mg by mouth every 6 (six) hours.    [provider]  benzonatate (TESSALON) 100 MG capsule Take 1 capsule (100 mg total) by mouth 3 (three) times daily. Cough 04/20/16   Rai, Ripudeep K, MD  Canagliflozin-Metformin HCl (INVOKAMET) [phone] MG TABS Take 1 tablet by mouth 2 (two) times daily.    [provider]  DULoxetine (CYMBALTA) 60 MG capsule Take 60 mg by mouth daily.    [provider]  Eluxadoline (VIBERZI) 75 MG TABS Take 75 mg by mouth 2 (two) times daily.    [provider]  Eslicarbazepine Acetate (APTIOM) 800 MG TABS Take 1,600 mg by mouth at bedtime.    [provider]  Gabapentin Enacarbil (HORIZANT) 600 MG TBCR Take 600 mg by mouth 2 (two) times daily.     [provider]  guaiFENesin (MUCINEX) 600 MG 12 hr tablet Take 1 tablet (600 mg total) by mouth 2 (two) times daily. 04/20/16   Rai, Ripudeep K, MD  guaiFENesin-dextromethorphan (ROBITUSSIN DM) 100-10 MG/5ML syrup Take 5 mLs by mouth every 4 (four) hours as needed for cough. 04/20/16   Rai, Ripudeep K, MD  HYDROmorphone (DILAUDID) 8 MG tablet Take 8  mg by mouth every 8 (eight) hours as needed (breakthrough pain).    [provider]  HYDROmorphone HCl (EXALGO) 12 MG T24A SR tablet Take 12 mg by mouth daily.    [provider]  insulin aspart (NOVOLOG FLEXPEN) 100 UNIT/ML FlexPen Inject 16-24 Units into the skin 3 (three) times daily with meals. SLIDING SCALE    [provider]  insulin detemir (LEVEMIR) 100 unit/ml SOLN Inject 40 Units into the skin at bedtime.    [provider]  levofloxacin (LEVAQUIN) 750 MG tablet Take 1 tablet (750 mg total) by mouth daily. X 3 days 04/20/16   Rai, Ripudeep K, MD  lisinopril (PRINIVIL,ZESTRIL) 5 MG tablet Take 1 tablet (5 mg total) by mouth daily. 10/08/15   Rai, Vernelle Emerald, MD  nortriptyline (PAMELOR) 25 MG capsule Take 50 mg by mouth at bedtime.     [provider]  pantoprazole (PROTONIX) 40 MG tablet Take 1 tablet (40 mg total) by mouth daily. 11/16/13   Lorayne Marek, MD  promethazine (PHENERGAN) 12.5 MG tablet Take 12.5 mg by mouth 2 (two) times daily.    [provider]    Family History Family History  Problem Relation Age of Onset  . Diabetes Mother   . Heart disease Mother   . Stroke Mother   . Heart failure Mother   . Heart disease Father   . Stroke Father   . Heart failure Father   . Heart attack Brother   . Healthy Brother     Social History Social History   Tobacco Use  . Smoking status: Current Every Day Smoker    Packs/day: 0.50    Years: 21.00    Pack years: 10.50    Types: Cigarettes  . Smokeless tobacco: Never Used  Substance Use Topics  . Alcohol use: No  . Drug use: No     Allergies   Codeine; Peanut-containing drug products; Tape; Aspirin; and Penicillins   Review of Systems Review of Systems  All other systems reviewed and are negative.    Physical Exam Updated Vital Signs BP 135/73 (BP Location: Right Arm)   Pulse (!) 101   Resp 20   SpO2 98%   Physical Exam  Constitutional: She is oriented  to person, place, and time. She appears well-developed and well-nourished. She is not intubated.  HENT:  Head: Normocephalic and atraumatic.  Mouth/Throat: Oropharynx is clear and moist. No oropharyngeal exudate or posterior oropharyngeal edema.  Eyes: Conjunctivae are normal. Pupils are equal, round, and reactive to light. Right eye exhibits no discharge. Left eye exhibits no discharge. No scleral icterus.  Neck: Normal range of motion. No JVD present. No tracheal deviation present.  Cardiovascular: Normal rate and regular rhythm. Exam reveals no friction rub.  No murmur heard. Pulmonary/Chest: Effort normal. No accessory muscle usage or stridor. No apnea, no tachypnea and no  bradypnea. She is not intubated. No respiratory distress.  Neurological: She is alert and oriented to person, place, and time. Coordination normal.  Skin:  Local irritation at the site of fentanyl patch bilateral deltoids-no other rashes, hives or abnormalities noted  Psychiatric: She has a normal mood and affect. Her behavior is normal. Judgment and thought content normal.  Nursing note and vitals reviewed.    ED Treatments / Results  Labs (all labs ordered are listed, but only abnormal results are displayed) Labs Reviewed - No data to display  EKG  EKG Interpretation None       Radiology No results found.  Procedures Procedures (including critical care time)  Medications Ordered in ED Medications - No data to display   Initial Impression / Assessment and Plan / ED Course  I have reviewed the triage vital signs and the nursing notes.  Pertinent labs & imaging results that were available during my care of the patient were reviewed by me and considered in my medical decision making (see chart for details).      Final Clinical Impressions(s) / ED Diagnoses   Final diagnoses:  Allergic reaction, initial encounter    Labs:   Imaging:  Consults:  Therapeutics:  Discharge Meds:    Assessment/Plan: 46 year old female presents today with complaints of allergic reaction.  I have low suspicion that this was true allergic reaction, I agree with husband that this was likely anxiety invoked.  Patient with clear lung sounds no respiratory distress.  She has no intraoral swelling, no rashes.  Patient will be monitored here in the ED, discharged with symptomatic care instructions and strict return precautions.  Patient without any chest pain, shortness of breath, low suspicion for PE or intrathoracic abnormality.  Patient has been monitored here in the emergency room for approximately 3 hours, she is in no acute distress with improvement in symptoms.  No signs of allergic reaction.  Patient does have an EpiPen at home, encouraged to use Benadryl as needed for symptoms, EpiPen as needed return as needed.  Both her and her husband verbalized understanding and agreement to today's plan and had no further questions or concerns at time of discharge.  Incidental finding on MRI that was performed in October notes thyroid nodule, encouraged patient that she would need to have this followed up with ultrasound, she will follow-up with her primary care provider for evaluation of this.      ED Discharge Orders    None       Francee Gentile 02/07/17 0019    Milton Ferguson, MD 02/08/17 [phone]

## 2017-02-06 NOTE — ED Notes (Signed)
Bed: WA03 Expected date:  Expected time:  Means of arrival:  Comments: 46 f rash

## 2017-02-06 NOTE — ED Triage Notes (Signed)
Pt BIB EMS from home- Pt c/o shortness of breath and feeling of throat swelling. Pt has only experienced this one other time when reacted to peanuts. Only new change is an increase in does of fentanyl patch. Itchiness and rash noted to areas of arms where patches have been. 20 ga IV R AC access by [facility]- pt was given 50m epi IM and 25 mg benadryl IV en route. 232mPO was taken by pt before EMS arrival.

## 2017-02-07 NOTE — Discharge Instructions (Signed)
Please read attached information. If you experience any new or worsening signs or symptoms please return to the emergency room for evaluation. Please follow-up with your primary care provider or specialist as discussed.  °

## 2017-02-10 DIAGNOSIS — R69 Illness, unspecified: Secondary | ICD-10-CM | POA: Diagnosis not present

## 2017-02-11 DIAGNOSIS — Z720 Tobacco use: Secondary | ICD-10-CM | POA: Diagnosis not present

## 2017-02-11 DIAGNOSIS — Z79891 Long term (current) use of opiate analgesic: Secondary | ICD-10-CM | POA: Diagnosis not present

## 2017-02-11 DIAGNOSIS — M5136 Other intervertebral disc degeneration, lumbar region: Secondary | ICD-10-CM | POA: Diagnosis not present

## 2017-02-11 DIAGNOSIS — M25532 Pain in left wrist: Secondary | ICD-10-CM | POA: Diagnosis not present

## 2017-02-11 DIAGNOSIS — G894 Chronic pain syndrome: Secondary | ICD-10-CM | POA: Diagnosis not present

## 2017-02-11 DIAGNOSIS — R69 Illness, unspecified: Secondary | ICD-10-CM | POA: Diagnosis not present

## 2017-02-11 DIAGNOSIS — G629 Polyneuropathy, unspecified: Secondary | ICD-10-CM | POA: Diagnosis not present

## 2017-02-11 DIAGNOSIS — E119 Type 2 diabetes mellitus without complications: Secondary | ICD-10-CM | POA: Diagnosis not present

## 2017-02-11 DIAGNOSIS — M5134 Other intervertebral disc degeneration, thoracic region: Secondary | ICD-10-CM | POA: Diagnosis not present

## 2017-02-11 DIAGNOSIS — M5416 Radiculopathy, lumbar region: Secondary | ICD-10-CM | POA: Diagnosis not present

## 2017-03-10 DIAGNOSIS — G629 Polyneuropathy, unspecified: Secondary | ICD-10-CM | POA: Diagnosis not present

## 2017-03-10 DIAGNOSIS — M5136 Other intervertebral disc degeneration, lumbar region: Secondary | ICD-10-CM | POA: Diagnosis not present

## 2017-03-10 DIAGNOSIS — M5416 Radiculopathy, lumbar region: Secondary | ICD-10-CM | POA: Diagnosis not present

## 2017-03-10 DIAGNOSIS — E119 Type 2 diabetes mellitus without complications: Secondary | ICD-10-CM | POA: Diagnosis not present

## 2017-03-10 DIAGNOSIS — R69 Illness, unspecified: Secondary | ICD-10-CM | POA: Diagnosis not present

## 2017-03-10 DIAGNOSIS — M25532 Pain in left wrist: Secondary | ICD-10-CM | POA: Diagnosis not present

## 2017-03-10 DIAGNOSIS — Z79891 Long term (current) use of opiate analgesic: Secondary | ICD-10-CM | POA: Diagnosis not present

## 2017-03-10 DIAGNOSIS — G894 Chronic pain syndrome: Secondary | ICD-10-CM | POA: Diagnosis not present

## 2017-03-10 DIAGNOSIS — M5134 Other intervertebral disc degeneration, thoracic region: Secondary | ICD-10-CM | POA: Diagnosis not present

## 2017-03-10 DIAGNOSIS — Z720 Tobacco use: Secondary | ICD-10-CM | POA: Diagnosis not present

## 2017-04-07 DIAGNOSIS — Z79891 Long term (current) use of opiate analgesic: Secondary | ICD-10-CM | POA: Diagnosis not present

## 2017-04-07 DIAGNOSIS — Z720 Tobacco use: Secondary | ICD-10-CM | POA: Diagnosis not present

## 2017-04-07 DIAGNOSIS — R69 Illness, unspecified: Secondary | ICD-10-CM | POA: Diagnosis not present

## 2017-04-07 DIAGNOSIS — G629 Polyneuropathy, unspecified: Secondary | ICD-10-CM | POA: Diagnosis not present

## 2017-04-07 DIAGNOSIS — G894 Chronic pain syndrome: Secondary | ICD-10-CM | POA: Diagnosis not present

## 2017-04-07 DIAGNOSIS — M5134 Other intervertebral disc degeneration, thoracic region: Secondary | ICD-10-CM | POA: Diagnosis not present

## 2017-04-07 DIAGNOSIS — M25532 Pain in left wrist: Secondary | ICD-10-CM | POA: Diagnosis not present

## 2017-04-07 DIAGNOSIS — M5416 Radiculopathy, lumbar region: Secondary | ICD-10-CM | POA: Diagnosis not present

## 2017-04-07 DIAGNOSIS — M5136 Other intervertebral disc degeneration, lumbar region: Secondary | ICD-10-CM | POA: Diagnosis not present

## 2017-04-07 DIAGNOSIS — E119 Type 2 diabetes mellitus without complications: Secondary | ICD-10-CM | POA: Diagnosis not present

## 2017-04-24 DIAGNOSIS — E1165 Type 2 diabetes mellitus with hyperglycemia: Secondary | ICD-10-CM | POA: Diagnosis not present

## 2017-05-05 DIAGNOSIS — Z79891 Long term (current) use of opiate analgesic: Secondary | ICD-10-CM | POA: Diagnosis not present

## 2017-05-05 DIAGNOSIS — Z720 Tobacco use: Secondary | ICD-10-CM | POA: Diagnosis not present

## 2017-05-05 DIAGNOSIS — G894 Chronic pain syndrome: Secondary | ICD-10-CM | POA: Diagnosis not present

## 2017-05-05 DIAGNOSIS — G629 Polyneuropathy, unspecified: Secondary | ICD-10-CM | POA: Diagnosis not present

## 2017-05-05 DIAGNOSIS — M5134 Other intervertebral disc degeneration, thoracic region: Secondary | ICD-10-CM | POA: Diagnosis not present

## 2017-05-05 DIAGNOSIS — M5136 Other intervertebral disc degeneration, lumbar region: Secondary | ICD-10-CM | POA: Diagnosis not present

## 2017-05-05 DIAGNOSIS — M25532 Pain in left wrist: Secondary | ICD-10-CM | POA: Diagnosis not present

## 2017-05-05 DIAGNOSIS — R69 Illness, unspecified: Secondary | ICD-10-CM | POA: Diagnosis not present

## 2017-05-05 DIAGNOSIS — M5416 Radiculopathy, lumbar region: Secondary | ICD-10-CM | POA: Diagnosis not present

## 2017-05-05 DIAGNOSIS — F1120 Opioid dependence, uncomplicated: Secondary | ICD-10-CM | POA: Diagnosis not present

## 2017-05-05 DIAGNOSIS — E119 Type 2 diabetes mellitus without complications: Secondary | ICD-10-CM | POA: Diagnosis not present

## 2017-05-28 DIAGNOSIS — I10 Essential (primary) hypertension: Secondary | ICD-10-CM | POA: Diagnosis not present

## 2017-05-28 DIAGNOSIS — R69 Illness, unspecified: Secondary | ICD-10-CM | POA: Diagnosis not present

## 2017-05-28 DIAGNOSIS — M069 Rheumatoid arthritis, unspecified: Secondary | ICD-10-CM | POA: Diagnosis not present

## 2017-05-28 DIAGNOSIS — G8929 Other chronic pain: Secondary | ICD-10-CM | POA: Diagnosis not present

## 2017-05-28 DIAGNOSIS — Z6834 Body mass index (BMI) 34.0-34.9, adult: Secondary | ICD-10-CM | POA: Diagnosis not present

## 2017-05-28 DIAGNOSIS — E041 Nontoxic single thyroid nodule: Secondary | ICD-10-CM | POA: Diagnosis not present

## 2017-05-28 DIAGNOSIS — E119 Type 2 diabetes mellitus without complications: Secondary | ICD-10-CM | POA: Diagnosis not present

## 2017-05-28 DIAGNOSIS — G988 Other disorders of nervous system: Secondary | ICD-10-CM | POA: Diagnosis not present

## 2017-05-28 DIAGNOSIS — Z23 Encounter for immunization: Secondary | ICD-10-CM | POA: Diagnosis not present

## 2017-05-28 DIAGNOSIS — Z720 Tobacco use: Secondary | ICD-10-CM | POA: Diagnosis not present

## 2017-06-09 DIAGNOSIS — M25532 Pain in left wrist: Secondary | ICD-10-CM | POA: Diagnosis not present

## 2017-06-09 DIAGNOSIS — M5136 Other intervertebral disc degeneration, lumbar region: Secondary | ICD-10-CM | POA: Diagnosis not present

## 2017-06-09 DIAGNOSIS — M5134 Other intervertebral disc degeneration, thoracic region: Secondary | ICD-10-CM | POA: Diagnosis not present

## 2017-06-09 DIAGNOSIS — Z720 Tobacco use: Secondary | ICD-10-CM | POA: Diagnosis not present

## 2017-06-09 DIAGNOSIS — M5416 Radiculopathy, lumbar region: Secondary | ICD-10-CM | POA: Diagnosis not present

## 2017-06-09 DIAGNOSIS — Z79891 Long term (current) use of opiate analgesic: Secondary | ICD-10-CM | POA: Diagnosis not present

## 2017-06-09 DIAGNOSIS — G894 Chronic pain syndrome: Secondary | ICD-10-CM | POA: Diagnosis not present

## 2017-06-09 DIAGNOSIS — G629 Polyneuropathy, unspecified: Secondary | ICD-10-CM | POA: Diagnosis not present

## 2017-06-09 DIAGNOSIS — E119 Type 2 diabetes mellitus without complications: Secondary | ICD-10-CM | POA: Diagnosis not present

## 2017-06-09 DIAGNOSIS — R69 Illness, unspecified: Secondary | ICD-10-CM | POA: Diagnosis not present

## 2017-07-08 DIAGNOSIS — Z720 Tobacco use: Secondary | ICD-10-CM | POA: Diagnosis not present

## 2017-07-08 DIAGNOSIS — R69 Illness, unspecified: Secondary | ICD-10-CM | POA: Diagnosis not present

## 2017-07-08 DIAGNOSIS — M25532 Pain in left wrist: Secondary | ICD-10-CM | POA: Diagnosis not present

## 2017-07-08 DIAGNOSIS — E119 Type 2 diabetes mellitus without complications: Secondary | ICD-10-CM | POA: Diagnosis not present

## 2017-07-08 DIAGNOSIS — M5134 Other intervertebral disc degeneration, thoracic region: Secondary | ICD-10-CM | POA: Diagnosis not present

## 2017-07-08 DIAGNOSIS — Z79891 Long term (current) use of opiate analgesic: Secondary | ICD-10-CM | POA: Diagnosis not present

## 2017-07-08 DIAGNOSIS — M5416 Radiculopathy, lumbar region: Secondary | ICD-10-CM | POA: Diagnosis not present

## 2017-07-08 DIAGNOSIS — M5136 Other intervertebral disc degeneration, lumbar region: Secondary | ICD-10-CM | POA: Diagnosis not present

## 2017-07-08 DIAGNOSIS — G894 Chronic pain syndrome: Secondary | ICD-10-CM | POA: Diagnosis not present

## 2017-07-08 DIAGNOSIS — G629 Polyneuropathy, unspecified: Secondary | ICD-10-CM | POA: Diagnosis not present

## 2017-07-13 DIAGNOSIS — E1165 Type 2 diabetes mellitus with hyperglycemia: Secondary | ICD-10-CM | POA: Diagnosis not present

## 2017-07-25 DIAGNOSIS — R0602 Shortness of breath: Secondary | ICD-10-CM | POA: Diagnosis not present

## 2017-07-25 DIAGNOSIS — R531 Weakness: Secondary | ICD-10-CM | POA: Diagnosis not present

## 2017-07-25 DIAGNOSIS — N12 Tubulo-interstitial nephritis, not specified as acute or chronic: Secondary | ICD-10-CM | POA: Diagnosis not present

## 2017-07-25 DIAGNOSIS — A419 Sepsis, unspecified organism: Secondary | ICD-10-CM | POA: Diagnosis not present

## 2017-07-26 DIAGNOSIS — E876 Hypokalemia: Secondary | ICD-10-CM | POA: Diagnosis not present

## 2017-07-26 DIAGNOSIS — I350 Nonrheumatic aortic (valve) stenosis: Secondary | ICD-10-CM | POA: Diagnosis not present

## 2017-07-26 DIAGNOSIS — N179 Acute kidney failure, unspecified: Secondary | ICD-10-CM | POA: Diagnosis not present

## 2017-07-26 DIAGNOSIS — J9601 Acute respiratory failure with hypoxia: Secondary | ICD-10-CM | POA: Diagnosis not present

## 2017-07-26 DIAGNOSIS — E1165 Type 2 diabetes mellitus with hyperglycemia: Secondary | ICD-10-CM | POA: Diagnosis not present

## 2017-07-26 DIAGNOSIS — B9561 Methicillin susceptible Staphylococcus aureus infection as the cause of diseases classified elsewhere: Secondary | ICD-10-CM | POA: Diagnosis not present

## 2017-07-26 DIAGNOSIS — R0602 Shortness of breath: Secondary | ICD-10-CM | POA: Diagnosis not present

## 2017-07-26 DIAGNOSIS — N10 Acute tubulo-interstitial nephritis: Secondary | ICD-10-CM | POA: Diagnosis not present

## 2017-07-26 DIAGNOSIS — A4101 Sepsis due to Methicillin susceptible Staphylococcus aureus: Secondary | ICD-10-CM | POA: Diagnosis not present

## 2017-07-26 DIAGNOSIS — I10 Essential (primary) hypertension: Secondary | ICD-10-CM | POA: Diagnosis not present

## 2017-07-26 DIAGNOSIS — E119 Type 2 diabetes mellitus without complications: Secondary | ICD-10-CM | POA: Diagnosis not present

## 2017-07-26 DIAGNOSIS — R531 Weakness: Secondary | ICD-10-CM | POA: Diagnosis not present

## 2017-07-26 DIAGNOSIS — M48061 Spinal stenosis, lumbar region without neurogenic claudication: Secondary | ICD-10-CM | POA: Diagnosis not present

## 2017-07-26 DIAGNOSIS — G894 Chronic pain syndrome: Secondary | ICD-10-CM | POA: Diagnosis not present

## 2017-07-26 DIAGNOSIS — R739 Hyperglycemia, unspecified: Secondary | ICD-10-CM | POA: Diagnosis not present

## 2017-07-26 DIAGNOSIS — M5126 Other intervertebral disc displacement, lumbar region: Secondary | ICD-10-CM | POA: Diagnosis not present

## 2017-07-26 DIAGNOSIS — A419 Sepsis, unspecified organism: Secondary | ICD-10-CM | POA: Diagnosis not present

## 2017-07-26 DIAGNOSIS — R1030 Lower abdominal pain, unspecified: Secondary | ICD-10-CM | POA: Diagnosis not present

## 2017-07-26 DIAGNOSIS — E871 Hypo-osmolality and hyponatremia: Secondary | ICD-10-CM | POA: Diagnosis not present

## 2017-07-26 DIAGNOSIS — Z452 Encounter for adjustment and management of vascular access device: Secondary | ICD-10-CM | POA: Diagnosis not present

## 2017-07-26 DIAGNOSIS — N12 Tubulo-interstitial nephritis, not specified as acute or chronic: Secondary | ICD-10-CM | POA: Diagnosis not present

## 2017-07-26 DIAGNOSIS — K5090 Crohn's disease, unspecified, without complications: Secondary | ICD-10-CM | POA: Diagnosis not present

## 2017-07-26 DIAGNOSIS — G40909 Epilepsy, unspecified, not intractable, without status epilepticus: Secondary | ICD-10-CM | POA: Diagnosis not present

## 2017-07-26 DIAGNOSIS — R69 Illness, unspecified: Secondary | ICD-10-CM | POA: Diagnosis not present

## 2017-07-26 DIAGNOSIS — R7881 Bacteremia: Secondary | ICD-10-CM | POA: Diagnosis not present

## 2017-07-29 DIAGNOSIS — R7881 Bacteremia: Secondary | ICD-10-CM | POA: Diagnosis not present

## 2017-07-29 DIAGNOSIS — B9561 Methicillin susceptible Staphylococcus aureus infection as the cause of diseases classified elsewhere: Secondary | ICD-10-CM | POA: Diagnosis not present

## 2017-07-29 DIAGNOSIS — I350 Nonrheumatic aortic (valve) stenosis: Secondary | ICD-10-CM

## 2017-07-29 DIAGNOSIS — A419 Sepsis, unspecified organism: Secondary | ICD-10-CM

## 2017-08-06 DIAGNOSIS — G894 Chronic pain syndrome: Secondary | ICD-10-CM

## 2017-08-06 DIAGNOSIS — R7881 Bacteremia: Secondary | ICD-10-CM

## 2017-08-06 DIAGNOSIS — Z8669 Personal history of other diseases of the nervous system and sense organs: Secondary | ICD-10-CM

## 2017-08-06 DIAGNOSIS — I10 Essential (primary) hypertension: Secondary | ICD-10-CM

## 2017-08-06 DIAGNOSIS — E119 Type 2 diabetes mellitus without complications: Secondary | ICD-10-CM

## 2017-08-06 DIAGNOSIS — N179 Acute kidney failure, unspecified: Secondary | ICD-10-CM

## 2017-08-06 DIAGNOSIS — Z8739 Personal history of other diseases of the musculoskeletal system and connective tissue: Secondary | ICD-10-CM

## 2017-08-08 DIAGNOSIS — G35 Multiple sclerosis: Secondary | ICD-10-CM | POA: Diagnosis not present

## 2017-08-08 DIAGNOSIS — K5090 Crohn's disease, unspecified, without complications: Secondary | ICD-10-CM | POA: Diagnosis not present

## 2017-08-08 DIAGNOSIS — N12 Tubulo-interstitial nephritis, not specified as acute or chronic: Secondary | ICD-10-CM | POA: Diagnosis not present

## 2017-08-08 DIAGNOSIS — M1990 Unspecified osteoarthritis, unspecified site: Secondary | ICD-10-CM | POA: Diagnosis not present

## 2017-08-08 DIAGNOSIS — A4101 Sepsis due to Methicillin susceptible Staphylococcus aureus: Secondary | ICD-10-CM | POA: Diagnosis not present

## 2017-08-08 DIAGNOSIS — I10 Essential (primary) hypertension: Secondary | ICD-10-CM | POA: Diagnosis not present

## 2017-08-08 DIAGNOSIS — M069 Rheumatoid arthritis, unspecified: Secondary | ICD-10-CM | POA: Diagnosis not present

## 2017-08-08 DIAGNOSIS — R69 Illness, unspecified: Secondary | ICD-10-CM | POA: Diagnosis not present

## 2017-08-08 DIAGNOSIS — G894 Chronic pain syndrome: Secondary | ICD-10-CM | POA: Diagnosis not present

## 2017-08-08 DIAGNOSIS — E119 Type 2 diabetes mellitus without complications: Secondary | ICD-10-CM | POA: Diagnosis not present

## 2017-08-08 DIAGNOSIS — D509 Iron deficiency anemia, unspecified: Secondary | ICD-10-CM | POA: Diagnosis not present

## 2017-08-08 DIAGNOSIS — G40909 Epilepsy, unspecified, not intractable, without status epilepticus: Secondary | ICD-10-CM | POA: Diagnosis not present

## 2017-08-09 DIAGNOSIS — E1165 Type 2 diabetes mellitus with hyperglycemia: Secondary | ICD-10-CM | POA: Diagnosis not present

## 2017-08-11 DIAGNOSIS — M069 Rheumatoid arthritis, unspecified: Secondary | ICD-10-CM | POA: Diagnosis not present

## 2017-08-11 DIAGNOSIS — D509 Iron deficiency anemia, unspecified: Secondary | ICD-10-CM | POA: Diagnosis not present

## 2017-08-11 DIAGNOSIS — A4101 Sepsis due to Methicillin susceptible Staphylococcus aureus: Secondary | ICD-10-CM | POA: Diagnosis not present

## 2017-08-11 DIAGNOSIS — I10 Essential (primary) hypertension: Secondary | ICD-10-CM | POA: Diagnosis not present

## 2017-08-11 DIAGNOSIS — G40909 Epilepsy, unspecified, not intractable, without status epilepticus: Secondary | ICD-10-CM | POA: Diagnosis not present

## 2017-08-11 DIAGNOSIS — R69 Illness, unspecified: Secondary | ICD-10-CM | POA: Diagnosis not present

## 2017-08-11 DIAGNOSIS — E119 Type 2 diabetes mellitus without complications: Secondary | ICD-10-CM | POA: Diagnosis not present

## 2017-08-11 DIAGNOSIS — G35 Multiple sclerosis: Secondary | ICD-10-CM | POA: Diagnosis not present

## 2017-08-11 DIAGNOSIS — Z5181 Encounter for therapeutic drug level monitoring: Secondary | ICD-10-CM | POA: Diagnosis not present

## 2017-08-11 DIAGNOSIS — M1990 Unspecified osteoarthritis, unspecified site: Secondary | ICD-10-CM | POA: Diagnosis not present

## 2017-08-11 DIAGNOSIS — N12 Tubulo-interstitial nephritis, not specified as acute or chronic: Secondary | ICD-10-CM | POA: Diagnosis not present

## 2017-08-16 DIAGNOSIS — Z5181 Encounter for therapeutic drug level monitoring: Secondary | ICD-10-CM | POA: Diagnosis not present

## 2017-08-16 DIAGNOSIS — E119 Type 2 diabetes mellitus without complications: Secondary | ICD-10-CM | POA: Diagnosis not present

## 2017-08-16 DIAGNOSIS — I10 Essential (primary) hypertension: Secondary | ICD-10-CM | POA: Diagnosis not present

## 2017-08-16 DIAGNOSIS — M1990 Unspecified osteoarthritis, unspecified site: Secondary | ICD-10-CM | POA: Diagnosis not present

## 2017-08-16 DIAGNOSIS — R69 Illness, unspecified: Secondary | ICD-10-CM | POA: Diagnosis not present

## 2017-08-16 DIAGNOSIS — D509 Iron deficiency anemia, unspecified: Secondary | ICD-10-CM | POA: Diagnosis not present

## 2017-08-16 DIAGNOSIS — M069 Rheumatoid arthritis, unspecified: Secondary | ICD-10-CM | POA: Diagnosis not present

## 2017-08-16 DIAGNOSIS — G40909 Epilepsy, unspecified, not intractable, without status epilepticus: Secondary | ICD-10-CM | POA: Diagnosis not present

## 2017-08-16 DIAGNOSIS — N12 Tubulo-interstitial nephritis, not specified as acute or chronic: Secondary | ICD-10-CM | POA: Diagnosis not present

## 2017-08-16 DIAGNOSIS — G35 Multiple sclerosis: Secondary | ICD-10-CM | POA: Diagnosis not present

## 2017-08-16 DIAGNOSIS — A4101 Sepsis due to Methicillin susceptible Staphylococcus aureus: Secondary | ICD-10-CM | POA: Diagnosis not present

## 2017-08-20 DIAGNOSIS — G4700 Insomnia, unspecified: Secondary | ICD-10-CM | POA: Diagnosis not present

## 2017-08-20 DIAGNOSIS — N12 Tubulo-interstitial nephritis, not specified as acute or chronic: Secondary | ICD-10-CM | POA: Diagnosis not present

## 2017-08-20 DIAGNOSIS — R7881 Bacteremia: Secondary | ICD-10-CM | POA: Diagnosis not present

## 2017-08-20 DIAGNOSIS — Z09 Encounter for follow-up examination after completed treatment for conditions other than malignant neoplasm: Secondary | ICD-10-CM | POA: Diagnosis not present

## 2017-08-20 DIAGNOSIS — Z6834 Body mass index (BMI) 34.0-34.9, adult: Secondary | ICD-10-CM | POA: Diagnosis not present

## 2017-08-20 DIAGNOSIS — Z79899 Other long term (current) drug therapy: Secondary | ICD-10-CM | POA: Diagnosis not present

## 2017-08-23 NOTE — Progress Notes (Deleted)
Patient: Andrea Wade  DOB: [birth year] MRN: [id number] PCP: Daphene Jaeger, PA-C  Referring Provider: Dr. Sloan Leiter [facility])   No chief complaint on file.    Patient Active Problem List   Diagnosis Date Noted  . Influenza 04/16/2016  . HCAP (healthcare-associated pneumonia) 04/16/2016  . Hypotension 04/16/2016  . Acute respiratory failure ([place]) 04/16/2016  . Sepsis ([place]) 04/15/2016  . Pyelonephritis 03/29/2016  . Chronic pain syndrome 03/29/2016  . Hypochloremia   . Hyponatremia   . Diabetes mellitus due to underlying condition without complications ([place]) 70/48/8891  . Anemia, unspecified 10/05/2013  . Hypokalemia 10/05/2013  . Smoking 10/05/2013  . Menorrhagia with regular cycle 10/05/2013  . Esophageal reflux 10/05/2013  . Chest pain 04/17/2013  . Seizure ([place]) 02/24/2013  . Atypical chest pain 09/22/2012  . Noncompliance 09/22/2012  . Tobacco abuse 09/22/2012  . DM2 (diabetes mellitus, type 2) ([place]) 09/22/2012  . Protein-calorie malnutrition, severe ([place]) 09/22/2012     Subjective:  Andrea Wade is a 47 y.o. F with past medical history significant for MS, DM-2, GERD, HTN, seizures, Chron diease, depression and migraines.   She was recently hospitalized for MSSA bacteremia at [facility] 5/26 - ***. She presented to the ED after a 4-5 day history of acutely worsening lower back and flank pain, dysuria and fevers. She was treated with supportive care on 07/20/17 during ED visit, however was called back to the hospital when her blood cultures were realized to be positive 2/2 sets for MSSA. She has a history of anaphylaxis to PCN and was given vancomycin for treatment during hospitalization. Work up for her staph aureus bacteremia included: TEE on 5/30 -negative for vegetations; L-spine MRI probable suppurative pyelonephritis of the right kidney --> U/S 6/3 3 cm phlegmon. This was discussed with urology at the time and decision was made to not proceed with  drainage but continue antibiotics for 3-4 weeks and consider repeating imaging.   Blood cultures:  5/21 - 2/2 MSSA 5/26 - 2/2 MSSA  5/29 - 1.2 MSSA 5/31 - No Growth   Urine Culture 5/27 > no growth (after abx initiated)  MRI L-Spine 5/29: Nodular loss of enhancement w/in the central R renal parenchyma suggesting pyelonephritis associated suppuration or necrosis. No lumbar discitis or osteomyelitis. Lumbar disc degeneration L2-3 through L4-5.   Case was d/w Dr. Baxter Flattery and recommended 4 weeks IV vancomycin for treatment. PICC was placed on 08/02/17 and she was discharged on *** to SNF. Other significant events noted during hospital stay included: AKI that resolved (Peak creatinine 1.7 - D/C  0.90). Last Vanc trough on 07/31/17 was 16.5 and she was sent out on 1500 mg IV Q18h. She is projected to complete IV therapy 08/27/17.   Since this time Andrea Wade ***.   ROS  Past Medical History:  Diagnosis Date  . Anemia   . Anxiety   . Arthritis    "joints" (04/17/2013)  . Chronic back pain    "all over" (04/17/2013)  . Crohn disease ([place])   . Daily headache   . Depression   . Diabetes type 2, uncontrolled ([place])   . 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 GERD (gastroesophageal reflux disease)   . Migraine    "q day" (04/17/2013)  . MS (multiple sclerosis) ([place])   . Pneumonia    "about 10 times in my lifetime" (04/17/2013)    Outpatient Medications Prior to Visit  Medication Sig Dispense Refill  .  baclofen (LIORESAL) 10 MG tablet Take 20 mg by mouth every 6 (six) hours.    . benzonatate (TESSALON) 100 MG capsule Take 1 capsule (100 mg total) by mouth 3 (three) times daily. Cough 30 capsule 0  . Canagliflozin-Metformin HCl (INVOKAMET) [phone] MG TABS Take 1 tablet by mouth 2 (two) times daily.    . DULoxetine (CYMBALTA) 60 MG capsule Take 60 mg by mouth daily.    . Eluxadoline (VIBERZI) 75 MG TABS Take 75 mg by mouth 2 (two) times daily.    . Eslicarbazepine  Acetate (APTIOM) 800 MG TABS Take 1,600 mg by mouth at bedtime.    . Gabapentin Enacarbil (HORIZANT) 600 MG TBCR Take 600 mg by mouth 2 (two) times daily.     Marland Kitchen guaiFENesin (MUCINEX) 600 MG 12 hr tablet Take 1 tablet (600 mg total) by mouth 2 (two) times daily. 30 tablet 3  . guaiFENesin-dextromethorphan (ROBITUSSIN DM) 100-10 MG/5ML syrup Take 5 mLs by mouth every 4 (four) hours as needed for cough. 118 mL 0  . HYDROmorphone (DILAUDID) 8 MG tablet Take 8 mg by mouth every 8 (eight) hours as needed (breakthrough pain).    Marland Kitchen HYDROmorphone HCl (EXALGO) 12 MG T24A SR tablet Take 12 mg by mouth daily.    . insulin aspart (NOVOLOG FLEXPEN) 100 UNIT/ML FlexPen Inject 16-24 Units into the skin 3 (three) times daily with meals. SLIDING SCALE    . insulin detemir (LEVEMIR) 100 unit/ml SOLN Inject 40 Units into the skin at bedtime.    Marland Kitchen levofloxacin (LEVAQUIN) 750 MG tablet Take 1 tablet (750 mg total) by mouth daily. X 3 days 3 tablet 0  . lisinopril (PRINIVIL,ZESTRIL) 5 MG tablet Take 1 tablet (5 mg total) by mouth daily. 30 tablet 3  . nortriptyline (PAMELOR) 25 MG capsule Take 50 mg by mouth at bedtime.     . pantoprazole (PROTONIX) 40 MG tablet Take 1 tablet (40 mg total) by mouth daily. 30 tablet 2  . promethazine (PHENERGAN) 12.5 MG tablet Take 12.5 mg by mouth 2 (two) times daily.     No facility-administered medications prior to visit.      Allergies  Allergen Reactions  . Codeine Hives, Itching and Palpitations  . Peanut-Containing Drug Products Anaphylaxis  . Tape Other (See Comments)    Slight irritation  . Aspirin Itching    High doses only-baby is ok  . Penicillins Hives, Itching and Nausea And Vomiting    Allergy from childhood Has patient had a PCN reaction causing immediate rash, facial/tongue/throat swelling, SOB or lightheadedness with hypotension: Yes Has patient had a PCN reaction causing severe rash involving mucus membranes or skin necrosis: Unk Has patient had a PCN  reaction that required hospitalization: Unk Has patient had a PCN reaction occurring within the last 10 years: No If all of the above answers are "NO", then may proceed with Cephalosporin use.      Social History   Tobacco Use  . Smoking status: Current Every Day Smoker    Packs/day: 0.50    Years: 21.00    Pack years: 10.50    Types: Cigarettes  . Smokeless tobacco: Never Used  Substance Use Topics  . Alcohol use: No  . Drug use: No    Family History  Problem Relation Age of Onset  . Diabetes Mother   . Heart disease Mother   . Stroke Mother   . Heart failure Mother   . Heart disease Father   . Stroke Father   .  Heart failure Father   . Heart attack Brother   . Healthy Brother     Objective:  There were no vitals filed for this visit. There is no height or weight on file to calculate BMI.  Physical Exam  Lab Results: Lab Results  Component Value Date   WBC 5.1 04/19/2016   HGB 9.5 (L) 04/19/2016   HCT 32.2 (L) 04/19/2016   MCV 65.8 (L) 04/19/2016   PLT 268 04/19/2016    Lab Results  Component Value Date   CREATININE 0.48 04/19/2016   BUN <5 (L) 04/19/2016   NA 138 04/19/2016   K 3.5 04/19/2016   CL 103 04/19/2016   CO2 28 04/19/2016    Lab Results  Component Value Date   ALT 24 04/15/2016   AST 33 04/15/2016   ALKPHOS 82 04/15/2016   BILITOT 0.4 04/15/2016     Assessment & Plan:   Problem List Items Addressed This Visit    None      *** will return to clinic in *** {weeks/months} for follow up  Janene Madeira, MSN, NP-C [facility] for [place] Pager: [phone] Office: [phone]  08/23/17  9:57 PM

## 2017-08-24 ENCOUNTER — Ambulatory Visit: Payer: Medicare HMO | Admitting: Infectious Diseases

## 2017-08-24 DIAGNOSIS — Z5181 Encounter for therapeutic drug level monitoring: Secondary | ICD-10-CM | POA: Diagnosis not present

## 2017-08-25 DIAGNOSIS — G894 Chronic pain syndrome: Secondary | ICD-10-CM | POA: Diagnosis not present

## 2017-08-25 DIAGNOSIS — M5136 Other intervertebral disc degeneration, lumbar region: Secondary | ICD-10-CM | POA: Diagnosis not present

## 2017-08-25 DIAGNOSIS — G629 Polyneuropathy, unspecified: Secondary | ICD-10-CM | POA: Diagnosis not present

## 2017-08-25 DIAGNOSIS — Z79891 Long term (current) use of opiate analgesic: Secondary | ICD-10-CM | POA: Diagnosis not present

## 2017-08-25 DIAGNOSIS — R69 Illness, unspecified: Secondary | ICD-10-CM | POA: Diagnosis not present

## 2017-08-25 DIAGNOSIS — M25532 Pain in left wrist: Secondary | ICD-10-CM | POA: Diagnosis not present

## 2017-08-25 DIAGNOSIS — M5134 Other intervertebral disc degeneration, thoracic region: Secondary | ICD-10-CM | POA: Diagnosis not present

## 2017-08-25 DIAGNOSIS — E119 Type 2 diabetes mellitus without complications: Secondary | ICD-10-CM | POA: Diagnosis not present

## 2017-08-25 DIAGNOSIS — Z720 Tobacco use: Secondary | ICD-10-CM | POA: Diagnosis not present

## 2017-08-25 DIAGNOSIS — M5416 Radiculopathy, lumbar region: Secondary | ICD-10-CM | POA: Diagnosis not present

## 2017-08-27 NOTE — Progress Notes (Deleted)
Patient: Andrea Wade  DOB: 21-Oct-1970 MRN: [id number] PCP: Daphene Jaeger, PA-C  Referring Provider: ***  No chief complaint on file.    Patient Active Problem List   Diagnosis Date Noted  . Influenza 04/16/2016  . HCAP (healthcare-associated pneumonia) 04/16/2016  . Hypotension 04/16/2016  . Acute respiratory failure ([place]) 04/16/2016  . Sepsis ([place]) 04/15/2016  . Pyelonephritis 03/29/2016  . Chronic pain syndrome 03/29/2016  . Hypochloremia   . Hyponatremia   . Diabetes mellitus due to underlying condition without complications ([place]) 87/68/1157  . Anemia, unspecified 10/05/2013  . Hypokalemia 10/05/2013  . Smoking 10/05/2013  . Menorrhagia with regular cycle 10/05/2013  . Esophageal reflux 10/05/2013  . Chest pain 04/17/2013  . Seizure ([place]) 02/24/2013  . Atypical chest pain 09/22/2012  . Noncompliance 09/22/2012  . Tobacco abuse 09/22/2012  . DM2 (diabetes mellitus, type 2) ([place]) 09/22/2012  . Protein-calorie malnutrition, severe ([place]) 09/22/2012     Subjective:  Andrea Wade is a 47 y.o. F with past medical history significant for MS, DM-2, GERD, HTN, seizures, Chron diease, depression and migraines.   She was recently hospitalized for MSSA bacteremia at [facility] 5/26 - ***. She presented to the ED after a 4-5 day history of acutely worsening lower back and flank pain, dysuria and fevers. She was treated with supportive care on 07/20/17 during ED visit, however was called back to the hospital when her blood cultures were realized to be positive 2/2 sets for MSSA. She has a history of anaphylaxis to PCN and was given vancomycin for treatment during hospitalization. Work up for her staph aureus bacteremia included: TEE on 5/30 -negative for vegetations; L-spine MRI probable suppurative pyelonephritis of the right kidney --> U/S 6/3 3 cm phlegmon. This was discussed with urology at the time and decision was made to not proceed with drainage but continue  antibiotics for 3-4 weeks and consider repeating imaging.   Blood cultures:  5/21 - 2/2 MSSA 5/26 - 2/2 MSSA  5/29 - 1.2 MSSA 5/31 - No Growth   Urine Culture 5/27 > no growth (after abx initiated)  MRI L-Spine 5/29: Nodular loss of enhancement w/in the central R renal parenchyma suggesting pyelonephritis associated suppuration or necrosis. No lumbar discitis or osteomyelitis. Lumbar disc degeneration L2-3 through L4-5.   Case was d/w Dr. Baxter Flattery and recommended 4 weeks IV vancomycin for treatment. PICC was placed on 08/02/17 and she was discharged on *** to SNF. Other significant events noted during hospital stay included: AKI that resolved (Peak creatinine 1.7 - D/C  0.90). Last Vanc trough on 07/31/17 was 16.5 and she was sent out on 1500 mg IV Q18h. She is projected to complete IV therapy 08/27/17.   Since this time [place] ***.  ROS  Past Medical History:  Diagnosis Date  . Anemia   . Anxiety   . Arthritis    "joints" (04/17/2013)  . Chronic back pain    "all over" (04/17/2013)  . Crohn disease ([place])   . Daily headache   . Depression   . Diabetes type 2, uncontrolled ([place])   . 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 GERD (gastroesophageal reflux disease)   . Migraine    "q day" (04/17/2013)  . MS (multiple sclerosis) ([place])   . Pneumonia    "about 10 times in my lifetime" (04/17/2013)    Outpatient Medications Prior to Visit  Medication Sig Dispense Refill  . baclofen (LIORESAL) 10 MG tablet  Take 20 mg by mouth every 6 (six) hours.    . benzonatate (TESSALON) 100 MG capsule Take 1 capsule (100 mg total) by mouth 3 (three) times daily. Cough 30 capsule 0  . Canagliflozin-Metformin HCl (INVOKAMET) [phone] MG TABS Take 1 tablet by mouth 2 (two) times daily.    . DULoxetine (CYMBALTA) 60 MG capsule Take 60 mg by mouth daily.    . Eluxadoline (VIBERZI) 75 MG TABS Take 75 mg by mouth 2 (two) times daily.    . Eslicarbazepine Acetate (APTIOM) 800 MG  TABS Take 1,600 mg by mouth at bedtime.    . Gabapentin Enacarbil (HORIZANT) 600 MG TBCR Take 600 mg by mouth 2 (two) times daily.     Marland Kitchen guaiFENesin (MUCINEX) 600 MG 12 hr tablet Take 1 tablet (600 mg total) by mouth 2 (two) times daily. 30 tablet 3  . guaiFENesin-dextromethorphan (ROBITUSSIN DM) 100-10 MG/5ML syrup Take 5 mLs by mouth every 4 (four) hours as needed for cough. 118 mL 0  . HYDROmorphone (DILAUDID) 8 MG tablet Take 8 mg by mouth every 8 (eight) hours as needed (breakthrough pain).    Marland Kitchen HYDROmorphone HCl (EXALGO) 12 MG T24A SR tablet Take 12 mg by mouth daily.    . insulin aspart (NOVOLOG FLEXPEN) 100 UNIT/ML FlexPen Inject 16-24 Units into the skin 3 (three) times daily with meals. SLIDING SCALE    . insulin detemir (LEVEMIR) 100 unit/ml SOLN Inject 40 Units into the skin at bedtime.    Marland Kitchen levofloxacin (LEVAQUIN) 750 MG tablet Take 1 tablet (750 mg total) by mouth daily. X 3 days 3 tablet 0  . lisinopril (PRINIVIL,ZESTRIL) 5 MG tablet Take 1 tablet (5 mg total) by mouth daily. 30 tablet 3  . nortriptyline (PAMELOR) 25 MG capsule Take 50 mg by mouth at bedtime.     . pantoprazole (PROTONIX) 40 MG tablet Take 1 tablet (40 mg total) by mouth daily. 30 tablet 2  . promethazine (PHENERGAN) 12.5 MG tablet Take 12.5 mg by mouth 2 (two) times daily.     No facility-administered medications prior to visit.      Allergies  Allergen Reactions  . Codeine Hives, Itching and Palpitations  . Peanut-Containing Drug Products Anaphylaxis  . Tape Other (See Comments)    Slight irritation  . Aspirin Itching    High doses only-baby is ok  . Penicillins Hives, Itching and Nausea And Vomiting    Allergy from childhood Has patient had a PCN reaction causing immediate rash, facial/tongue/throat swelling, SOB or lightheadedness with hypotension: Yes Has patient had a PCN reaction causing severe rash involving mucus membranes or skin necrosis: Unk Has patient had a PCN reaction that required  hospitalization: Unk Has patient had a PCN reaction occurring within the last 10 years: No If all of the above answers are "NO", then may proceed with Cephalosporin use.      Social History   Tobacco Use  . Smoking status: Current Every Day Smoker    Packs/day: 0.50    Years: 21.00    Pack years: 10.50    Types: Cigarettes  . Smokeless tobacco: Never Used  Substance Use Topics  . Alcohol use: No  . Drug use: No    Family History  Problem Relation Age of Onset  . Diabetes Mother   . Heart disease Mother   . Stroke Mother   . Heart failure Mother   . Heart disease Father   . Stroke Father   . Heart failure Father   .  Heart attack Brother   . Healthy Brother     Objective:  There were no vitals filed for this visit. There is no height or weight on file to calculate BMI.  Physical Exam  Lab Results: Lab Results  Component Value Date   WBC 5.1 04/19/2016   HGB 9.5 (L) 04/19/2016   HCT 32.2 (L) 04/19/2016   MCV 65.8 (L) 04/19/2016   PLT 268 04/19/2016    Lab Results  Component Value Date   CREATININE 0.48 04/19/2016   BUN <5 (L) 04/19/2016   NA 138 04/19/2016   K 3.5 04/19/2016   CL 103 04/19/2016   CO2 28 04/19/2016    Lab Results  Component Value Date   ALT 24 04/15/2016   AST 33 04/15/2016   ALKPHOS 82 04/15/2016   BILITOT 0.4 04/15/2016     Assessment & Plan:   Problem List Items Addressed This Visit    None      *** will return to clinic in *** {weeks/months} for follow up  Janene Madeira, MSN, NP-C [facility] for [place] Pager: [phone] Office: [phone]  08/27/17  4:26 PM

## 2017-08-30 ENCOUNTER — Ambulatory Visit: Payer: Medicare HMO | Admitting: Infectious Diseases

## 2017-09-01 DIAGNOSIS — M1990 Unspecified osteoarthritis, unspecified site: Secondary | ICD-10-CM | POA: Diagnosis not present

## 2017-09-01 DIAGNOSIS — R69 Illness, unspecified: Secondary | ICD-10-CM | POA: Diagnosis not present

## 2017-09-01 DIAGNOSIS — A4101 Sepsis due to Methicillin susceptible Staphylococcus aureus: Secondary | ICD-10-CM | POA: Diagnosis not present

## 2017-09-01 DIAGNOSIS — N12 Tubulo-interstitial nephritis, not specified as acute or chronic: Secondary | ICD-10-CM | POA: Diagnosis not present

## 2017-09-01 DIAGNOSIS — K5090 Crohn's disease, unspecified, without complications: Secondary | ICD-10-CM | POA: Diagnosis not present

## 2017-09-01 DIAGNOSIS — G35 Multiple sclerosis: Secondary | ICD-10-CM | POA: Diagnosis not present

## 2017-09-01 DIAGNOSIS — G894 Chronic pain syndrome: Secondary | ICD-10-CM | POA: Diagnosis not present

## 2017-09-22 DIAGNOSIS — M5134 Other intervertebral disc degeneration, thoracic region: Secondary | ICD-10-CM | POA: Diagnosis not present

## 2017-09-22 DIAGNOSIS — G894 Chronic pain syndrome: Secondary | ICD-10-CM | POA: Diagnosis not present

## 2017-09-22 DIAGNOSIS — Z79891 Long term (current) use of opiate analgesic: Secondary | ICD-10-CM | POA: Diagnosis not present

## 2017-09-22 DIAGNOSIS — R69 Illness, unspecified: Secondary | ICD-10-CM | POA: Diagnosis not present

## 2017-09-22 DIAGNOSIS — Z720 Tobacco use: Secondary | ICD-10-CM | POA: Diagnosis not present

## 2017-09-22 DIAGNOSIS — M25532 Pain in left wrist: Secondary | ICD-10-CM | POA: Diagnosis not present

## 2017-09-22 DIAGNOSIS — G629 Polyneuropathy, unspecified: Secondary | ICD-10-CM | POA: Diagnosis not present

## 2017-09-22 DIAGNOSIS — E119 Type 2 diabetes mellitus without complications: Secondary | ICD-10-CM | POA: Diagnosis not present

## 2017-09-22 DIAGNOSIS — M5136 Other intervertebral disc degeneration, lumbar region: Secondary | ICD-10-CM | POA: Diagnosis not present

## 2017-09-22 DIAGNOSIS — M5416 Radiculopathy, lumbar region: Secondary | ICD-10-CM | POA: Diagnosis not present

## 2017-10-01 DIAGNOSIS — E1165 Type 2 diabetes mellitus with hyperglycemia: Secondary | ICD-10-CM | POA: Diagnosis not present

## 2017-10-13 DIAGNOSIS — R0902 Hypoxemia: Secondary | ICD-10-CM | POA: Diagnosis not present

## 2017-10-13 DIAGNOSIS — R109 Unspecified abdominal pain: Secondary | ICD-10-CM | POA: Diagnosis not present

## 2017-10-13 DIAGNOSIS — R0789 Other chest pain: Secondary | ICD-10-CM | POA: Diagnosis not present

## 2017-10-13 DIAGNOSIS — R079 Chest pain, unspecified: Secondary | ICD-10-CM | POA: Diagnosis not present

## 2017-10-13 DIAGNOSIS — I10 Essential (primary) hypertension: Secondary | ICD-10-CM | POA: Diagnosis not present

## 2017-10-13 DIAGNOSIS — R531 Weakness: Secondary | ICD-10-CM | POA: Diagnosis not present

## 2017-10-13 DIAGNOSIS — E876 Hypokalemia: Secondary | ICD-10-CM | POA: Diagnosis not present

## 2017-10-13 DIAGNOSIS — M7989 Other specified soft tissue disorders: Secondary | ICD-10-CM | POA: Diagnosis not present

## 2017-10-13 DIAGNOSIS — R339 Retention of urine, unspecified: Secondary | ICD-10-CM | POA: Diagnosis not present

## 2017-10-13 DIAGNOSIS — N83292 Other ovarian cyst, left side: Secondary | ICD-10-CM | POA: Diagnosis not present

## 2017-10-13 DIAGNOSIS — G35 Multiple sclerosis: Secondary | ICD-10-CM | POA: Diagnosis not present

## 2017-10-13 DIAGNOSIS — G40909 Epilepsy, unspecified, not intractable, without status epilepticus: Secondary | ICD-10-CM | POA: Diagnosis not present

## 2017-10-13 DIAGNOSIS — N83209 Unspecified ovarian cyst, unspecified side: Secondary | ICD-10-CM | POA: Diagnosis not present

## 2017-10-13 DIAGNOSIS — N83291 Other ovarian cyst, right side: Secondary | ICD-10-CM | POA: Diagnosis not present

## 2017-10-13 DIAGNOSIS — E1165 Type 2 diabetes mellitus with hyperglycemia: Secondary | ICD-10-CM | POA: Diagnosis not present

## 2017-10-17 DIAGNOSIS — E119 Type 2 diabetes mellitus without complications: Secondary | ICD-10-CM | POA: Diagnosis not present

## 2017-10-17 DIAGNOSIS — G40909 Epilepsy, unspecified, not intractable, without status epilepticus: Secondary | ICD-10-CM | POA: Diagnosis not present

## 2017-10-17 DIAGNOSIS — M1991 Primary osteoarthritis, unspecified site: Secondary | ICD-10-CM | POA: Diagnosis not present

## 2017-10-17 DIAGNOSIS — Z466 Encounter for fitting and adjustment of urinary device: Secondary | ICD-10-CM | POA: Diagnosis not present

## 2017-10-17 DIAGNOSIS — K219 Gastro-esophageal reflux disease without esophagitis: Secondary | ICD-10-CM | POA: Diagnosis not present

## 2017-10-17 DIAGNOSIS — R69 Illness, unspecified: Secondary | ICD-10-CM | POA: Diagnosis not present

## 2017-10-17 DIAGNOSIS — R339 Retention of urine, unspecified: Secondary | ICD-10-CM | POA: Diagnosis not present

## 2017-10-17 DIAGNOSIS — I10 Essential (primary) hypertension: Secondary | ICD-10-CM | POA: Diagnosis not present

## 2017-10-17 DIAGNOSIS — G35 Multiple sclerosis: Secondary | ICD-10-CM | POA: Diagnosis not present

## 2017-10-19 DIAGNOSIS — M1991 Primary osteoarthritis, unspecified site: Secondary | ICD-10-CM | POA: Diagnosis not present

## 2017-10-19 DIAGNOSIS — R69 Illness, unspecified: Secondary | ICD-10-CM | POA: Diagnosis not present

## 2017-10-19 DIAGNOSIS — I10 Essential (primary) hypertension: Secondary | ICD-10-CM | POA: Diagnosis not present

## 2017-10-19 DIAGNOSIS — R339 Retention of urine, unspecified: Secondary | ICD-10-CM | POA: Diagnosis not present

## 2017-10-19 DIAGNOSIS — K219 Gastro-esophageal reflux disease without esophagitis: Secondary | ICD-10-CM | POA: Diagnosis not present

## 2017-10-19 DIAGNOSIS — G35 Multiple sclerosis: Secondary | ICD-10-CM | POA: Diagnosis not present

## 2017-10-19 DIAGNOSIS — E119 Type 2 diabetes mellitus without complications: Secondary | ICD-10-CM | POA: Diagnosis not present

## 2017-10-19 DIAGNOSIS — Z466 Encounter for fitting and adjustment of urinary device: Secondary | ICD-10-CM | POA: Diagnosis not present

## 2017-10-19 DIAGNOSIS — G40909 Epilepsy, unspecified, not intractable, without status epilepticus: Secondary | ICD-10-CM | POA: Diagnosis not present

## 2017-10-20 DIAGNOSIS — R339 Retention of urine, unspecified: Secondary | ICD-10-CM | POA: Diagnosis not present

## 2017-10-20 DIAGNOSIS — I10 Essential (primary) hypertension: Secondary | ICD-10-CM | POA: Diagnosis not present

## 2017-10-20 DIAGNOSIS — R69 Illness, unspecified: Secondary | ICD-10-CM | POA: Diagnosis not present

## 2017-10-20 DIAGNOSIS — G35 Multiple sclerosis: Secondary | ICD-10-CM | POA: Diagnosis not present

## 2017-10-20 DIAGNOSIS — Z466 Encounter for fitting and adjustment of urinary device: Secondary | ICD-10-CM | POA: Diagnosis not present

## 2017-10-20 DIAGNOSIS — K219 Gastro-esophageal reflux disease without esophagitis: Secondary | ICD-10-CM | POA: Diagnosis not present

## 2017-10-20 DIAGNOSIS — E119 Type 2 diabetes mellitus without complications: Secondary | ICD-10-CM | POA: Diagnosis not present

## 2017-10-20 DIAGNOSIS — M1991 Primary osteoarthritis, unspecified site: Secondary | ICD-10-CM | POA: Diagnosis not present

## 2017-10-20 DIAGNOSIS — G40909 Epilepsy, unspecified, not intractable, without status epilepticus: Secondary | ICD-10-CM | POA: Diagnosis not present

## 2017-10-21 DIAGNOSIS — K219 Gastro-esophageal reflux disease without esophagitis: Secondary | ICD-10-CM | POA: Diagnosis not present

## 2017-10-21 DIAGNOSIS — E119 Type 2 diabetes mellitus without complications: Secondary | ICD-10-CM | POA: Diagnosis not present

## 2017-10-21 DIAGNOSIS — R339 Retention of urine, unspecified: Secondary | ICD-10-CM | POA: Diagnosis not present

## 2017-10-21 DIAGNOSIS — R309 Painful micturition, unspecified: Secondary | ICD-10-CM | POA: Diagnosis not present

## 2017-10-21 DIAGNOSIS — G40909 Epilepsy, unspecified, not intractable, without status epilepticus: Secondary | ICD-10-CM | POA: Diagnosis not present

## 2017-10-21 DIAGNOSIS — G35 Multiple sclerosis: Secondary | ICD-10-CM | POA: Diagnosis not present

## 2017-10-21 DIAGNOSIS — M1991 Primary osteoarthritis, unspecified site: Secondary | ICD-10-CM | POA: Diagnosis not present

## 2017-10-21 DIAGNOSIS — Z466 Encounter for fitting and adjustment of urinary device: Secondary | ICD-10-CM | POA: Diagnosis not present

## 2017-10-21 DIAGNOSIS — R69 Illness, unspecified: Secondary | ICD-10-CM | POA: Diagnosis not present

## 2017-10-21 DIAGNOSIS — I10 Essential (primary) hypertension: Secondary | ICD-10-CM | POA: Diagnosis not present

## 2017-10-22 DIAGNOSIS — N3000 Acute cystitis without hematuria: Secondary | ICD-10-CM | POA: Diagnosis not present

## 2017-10-22 DIAGNOSIS — G35 Multiple sclerosis: Secondary | ICD-10-CM | POA: Diagnosis not present

## 2017-10-22 DIAGNOSIS — Z6834 Body mass index (BMI) 34.0-34.9, adult: Secondary | ICD-10-CM | POA: Diagnosis not present

## 2017-10-22 DIAGNOSIS — N3090 Cystitis, unspecified without hematuria: Secondary | ICD-10-CM | POA: Diagnosis not present

## 2017-10-22 DIAGNOSIS — E876 Hypokalemia: Secondary | ICD-10-CM | POA: Diagnosis not present

## 2017-10-22 DIAGNOSIS — N318 Other neuromuscular dysfunction of bladder: Secondary | ICD-10-CM | POA: Diagnosis not present

## 2017-10-28 DIAGNOSIS — G40909 Epilepsy, unspecified, not intractable, without status epilepticus: Secondary | ICD-10-CM | POA: Diagnosis not present

## 2017-10-28 DIAGNOSIS — Z466 Encounter for fitting and adjustment of urinary device: Secondary | ICD-10-CM | POA: Diagnosis not present

## 2017-10-28 DIAGNOSIS — G35 Multiple sclerosis: Secondary | ICD-10-CM | POA: Diagnosis not present

## 2017-10-28 DIAGNOSIS — M1991 Primary osteoarthritis, unspecified site: Secondary | ICD-10-CM | POA: Diagnosis not present

## 2017-10-28 DIAGNOSIS — R338 Other retention of urine: Secondary | ICD-10-CM | POA: Diagnosis not present

## 2017-10-28 DIAGNOSIS — K219 Gastro-esophageal reflux disease without esophagitis: Secondary | ICD-10-CM | POA: Diagnosis not present

## 2017-10-28 DIAGNOSIS — R69 Illness, unspecified: Secondary | ICD-10-CM | POA: Diagnosis not present

## 2017-10-28 DIAGNOSIS — I10 Essential (primary) hypertension: Secondary | ICD-10-CM | POA: Diagnosis not present

## 2017-10-28 DIAGNOSIS — R339 Retention of urine, unspecified: Secondary | ICD-10-CM | POA: Diagnosis not present

## 2017-10-28 DIAGNOSIS — E119 Type 2 diabetes mellitus without complications: Secondary | ICD-10-CM | POA: Diagnosis not present

## 2017-11-03 DIAGNOSIS — G40909 Epilepsy, unspecified, not intractable, without status epilepticus: Secondary | ICD-10-CM | POA: Diagnosis not present

## 2017-11-03 DIAGNOSIS — G35 Multiple sclerosis: Secondary | ICD-10-CM | POA: Diagnosis not present

## 2017-11-03 DIAGNOSIS — M1991 Primary osteoarthritis, unspecified site: Secondary | ICD-10-CM | POA: Diagnosis not present

## 2017-11-03 DIAGNOSIS — R69 Illness, unspecified: Secondary | ICD-10-CM | POA: Diagnosis not present

## 2017-11-03 DIAGNOSIS — I10 Essential (primary) hypertension: Secondary | ICD-10-CM | POA: Diagnosis not present

## 2017-11-03 DIAGNOSIS — R339 Retention of urine, unspecified: Secondary | ICD-10-CM | POA: Diagnosis not present

## 2017-11-03 DIAGNOSIS — Z466 Encounter for fitting and adjustment of urinary device: Secondary | ICD-10-CM | POA: Diagnosis not present

## 2017-11-03 DIAGNOSIS — K219 Gastro-esophageal reflux disease without esophagitis: Secondary | ICD-10-CM | POA: Diagnosis not present

## 2017-11-03 DIAGNOSIS — E119 Type 2 diabetes mellitus without complications: Secondary | ICD-10-CM | POA: Diagnosis not present

## 2017-11-09 DIAGNOSIS — G40909 Epilepsy, unspecified, not intractable, without status epilepticus: Secondary | ICD-10-CM | POA: Diagnosis not present

## 2017-11-09 DIAGNOSIS — R69 Illness, unspecified: Secondary | ICD-10-CM | POA: Diagnosis not present

## 2017-11-09 DIAGNOSIS — G35 Multiple sclerosis: Secondary | ICD-10-CM | POA: Diagnosis not present

## 2017-11-09 DIAGNOSIS — R339 Retention of urine, unspecified: Secondary | ICD-10-CM | POA: Diagnosis not present

## 2017-11-09 DIAGNOSIS — I10 Essential (primary) hypertension: Secondary | ICD-10-CM | POA: Diagnosis not present

## 2017-11-09 DIAGNOSIS — M1991 Primary osteoarthritis, unspecified site: Secondary | ICD-10-CM | POA: Diagnosis not present

## 2017-11-09 DIAGNOSIS — K219 Gastro-esophageal reflux disease without esophagitis: Secondary | ICD-10-CM | POA: Diagnosis not present

## 2017-11-09 DIAGNOSIS — E119 Type 2 diabetes mellitus without complications: Secondary | ICD-10-CM | POA: Diagnosis not present

## 2017-11-09 DIAGNOSIS — Z466 Encounter for fitting and adjustment of urinary device: Secondary | ICD-10-CM | POA: Diagnosis not present

## 2017-11-19 DIAGNOSIS — K219 Gastro-esophageal reflux disease without esophagitis: Secondary | ICD-10-CM | POA: Diagnosis not present

## 2017-11-19 DIAGNOSIS — G40909 Epilepsy, unspecified, not intractable, without status epilepticus: Secondary | ICD-10-CM | POA: Diagnosis not present

## 2017-11-19 DIAGNOSIS — Z466 Encounter for fitting and adjustment of urinary device: Secondary | ICD-10-CM | POA: Diagnosis not present

## 2017-11-19 DIAGNOSIS — I10 Essential (primary) hypertension: Secondary | ICD-10-CM | POA: Diagnosis not present

## 2017-11-19 DIAGNOSIS — M1991 Primary osteoarthritis, unspecified site: Secondary | ICD-10-CM | POA: Diagnosis not present

## 2017-11-19 DIAGNOSIS — R69 Illness, unspecified: Secondary | ICD-10-CM | POA: Diagnosis not present

## 2017-11-19 DIAGNOSIS — E119 Type 2 diabetes mellitus without complications: Secondary | ICD-10-CM | POA: Diagnosis not present

## 2017-11-19 DIAGNOSIS — G35 Multiple sclerosis: Secondary | ICD-10-CM | POA: Diagnosis not present

## 2017-11-19 DIAGNOSIS — R339 Retention of urine, unspecified: Secondary | ICD-10-CM | POA: Diagnosis not present

## 2017-11-24 DIAGNOSIS — G40909 Epilepsy, unspecified, not intractable, without status epilepticus: Secondary | ICD-10-CM | POA: Diagnosis not present

## 2017-11-24 DIAGNOSIS — K219 Gastro-esophageal reflux disease without esophagitis: Secondary | ICD-10-CM | POA: Diagnosis not present

## 2017-11-24 DIAGNOSIS — R69 Illness, unspecified: Secondary | ICD-10-CM | POA: Diagnosis not present

## 2017-11-24 DIAGNOSIS — E119 Type 2 diabetes mellitus without complications: Secondary | ICD-10-CM | POA: Diagnosis not present

## 2017-11-24 DIAGNOSIS — M1991 Primary osteoarthritis, unspecified site: Secondary | ICD-10-CM | POA: Diagnosis not present

## 2017-11-24 DIAGNOSIS — G35 Multiple sclerosis: Secondary | ICD-10-CM | POA: Diagnosis not present

## 2017-11-24 DIAGNOSIS — I10 Essential (primary) hypertension: Secondary | ICD-10-CM | POA: Diagnosis not present

## 2017-11-24 DIAGNOSIS — R339 Retention of urine, unspecified: Secondary | ICD-10-CM | POA: Diagnosis not present

## 2017-11-24 DIAGNOSIS — Z466 Encounter for fitting and adjustment of urinary device: Secondary | ICD-10-CM | POA: Diagnosis not present

## 2017-11-26 DIAGNOSIS — G35 Multiple sclerosis: Secondary | ICD-10-CM | POA: Diagnosis not present

## 2017-11-26 DIAGNOSIS — G43909 Migraine, unspecified, not intractable, without status migrainosus: Secondary | ICD-10-CM | POA: Diagnosis not present

## 2017-11-26 DIAGNOSIS — R69 Illness, unspecified: Secondary | ICD-10-CM | POA: Diagnosis not present

## 2017-11-26 DIAGNOSIS — I10 Essential (primary) hypertension: Secondary | ICD-10-CM | POA: Diagnosis not present

## 2017-11-26 DIAGNOSIS — E119 Type 2 diabetes mellitus without complications: Secondary | ICD-10-CM | POA: Diagnosis not present

## 2017-11-26 DIAGNOSIS — G8929 Other chronic pain: Secondary | ICD-10-CM | POA: Diagnosis not present

## 2017-11-26 DIAGNOSIS — E876 Hypokalemia: Secondary | ICD-10-CM | POA: Diagnosis not present

## 2017-11-26 DIAGNOSIS — M069 Rheumatoid arthritis, unspecified: Secondary | ICD-10-CM | POA: Diagnosis not present

## 2017-11-26 DIAGNOSIS — Z1331 Encounter for screening for depression: Secondary | ICD-10-CM | POA: Diagnosis not present

## 2017-11-26 DIAGNOSIS — G40909 Epilepsy, unspecified, not intractable, without status epilepticus: Secondary | ICD-10-CM | POA: Diagnosis not present

## 2017-12-07 DIAGNOSIS — M25532 Pain in left wrist: Secondary | ICD-10-CM | POA: Diagnosis not present

## 2017-12-07 DIAGNOSIS — Z79891 Long term (current) use of opiate analgesic: Secondary | ICD-10-CM | POA: Diagnosis not present

## 2017-12-07 DIAGNOSIS — G629 Polyneuropathy, unspecified: Secondary | ICD-10-CM | POA: Diagnosis not present

## 2017-12-07 DIAGNOSIS — M5416 Radiculopathy, lumbar region: Secondary | ICD-10-CM | POA: Diagnosis not present

## 2017-12-07 DIAGNOSIS — E119 Type 2 diabetes mellitus without complications: Secondary | ICD-10-CM | POA: Diagnosis not present

## 2017-12-07 DIAGNOSIS — R69 Illness, unspecified: Secondary | ICD-10-CM | POA: Diagnosis not present

## 2017-12-07 DIAGNOSIS — Z720 Tobacco use: Secondary | ICD-10-CM | POA: Diagnosis not present

## 2017-12-07 DIAGNOSIS — M5136 Other intervertebral disc degeneration, lumbar region: Secondary | ICD-10-CM | POA: Diagnosis not present

## 2017-12-07 DIAGNOSIS — G894 Chronic pain syndrome: Secondary | ICD-10-CM | POA: Diagnosis not present

## 2017-12-07 DIAGNOSIS — M5134 Other intervertebral disc degeneration, thoracic region: Secondary | ICD-10-CM | POA: Diagnosis not present

## 2017-12-07 DIAGNOSIS — F1120 Opioid dependence, uncomplicated: Secondary | ICD-10-CM | POA: Diagnosis not present

## 2017-12-14 DIAGNOSIS — Z466 Encounter for fitting and adjustment of urinary device: Secondary | ICD-10-CM | POA: Diagnosis not present

## 2017-12-14 DIAGNOSIS — K219 Gastro-esophageal reflux disease without esophagitis: Secondary | ICD-10-CM | POA: Diagnosis not present

## 2017-12-14 DIAGNOSIS — G35 Multiple sclerosis: Secondary | ICD-10-CM | POA: Diagnosis not present

## 2017-12-14 DIAGNOSIS — E119 Type 2 diabetes mellitus without complications: Secondary | ICD-10-CM | POA: Diagnosis not present

## 2017-12-14 DIAGNOSIS — I10 Essential (primary) hypertension: Secondary | ICD-10-CM | POA: Diagnosis not present

## 2017-12-14 DIAGNOSIS — R339 Retention of urine, unspecified: Secondary | ICD-10-CM | POA: Diagnosis not present

## 2017-12-14 DIAGNOSIS — R69 Illness, unspecified: Secondary | ICD-10-CM | POA: Diagnosis not present

## 2017-12-14 DIAGNOSIS — G40909 Epilepsy, unspecified, not intractable, without status epilepticus: Secondary | ICD-10-CM | POA: Diagnosis not present

## 2017-12-14 DIAGNOSIS — M1991 Primary osteoarthritis, unspecified site: Secondary | ICD-10-CM | POA: Diagnosis not present

## 2017-12-15 DIAGNOSIS — E871 Hypo-osmolality and hyponatremia: Secondary | ICD-10-CM | POA: Diagnosis not present

## 2017-12-20 DIAGNOSIS — E1165 Type 2 diabetes mellitus with hyperglycemia: Secondary | ICD-10-CM | POA: Diagnosis not present

## 2018-01-04 DIAGNOSIS — Z79891 Long term (current) use of opiate analgesic: Secondary | ICD-10-CM | POA: Diagnosis not present

## 2018-01-04 DIAGNOSIS — G629 Polyneuropathy, unspecified: Secondary | ICD-10-CM | POA: Diagnosis not present

## 2018-01-04 DIAGNOSIS — M25532 Pain in left wrist: Secondary | ICD-10-CM | POA: Diagnosis not present

## 2018-01-04 DIAGNOSIS — M5416 Radiculopathy, lumbar region: Secondary | ICD-10-CM | POA: Diagnosis not present

## 2018-01-04 DIAGNOSIS — E119 Type 2 diabetes mellitus without complications: Secondary | ICD-10-CM | POA: Diagnosis not present

## 2018-01-04 DIAGNOSIS — M5134 Other intervertebral disc degeneration, thoracic region: Secondary | ICD-10-CM | POA: Diagnosis not present

## 2018-01-04 DIAGNOSIS — G894 Chronic pain syndrome: Secondary | ICD-10-CM | POA: Diagnosis not present

## 2018-01-04 DIAGNOSIS — R69 Illness, unspecified: Secondary | ICD-10-CM | POA: Diagnosis not present

## 2018-01-04 DIAGNOSIS — M5136 Other intervertebral disc degeneration, lumbar region: Secondary | ICD-10-CM | POA: Diagnosis not present

## 2018-01-04 DIAGNOSIS — Z720 Tobacco use: Secondary | ICD-10-CM | POA: Diagnosis not present

## 2018-02-02 DIAGNOSIS — Z720 Tobacco use: Secondary | ICD-10-CM | POA: Diagnosis not present

## 2018-02-02 DIAGNOSIS — M5134 Other intervertebral disc degeneration, thoracic region: Secondary | ICD-10-CM | POA: Diagnosis not present

## 2018-02-02 DIAGNOSIS — M5136 Other intervertebral disc degeneration, lumbar region: Secondary | ICD-10-CM | POA: Diagnosis not present

## 2018-02-02 DIAGNOSIS — R69 Illness, unspecified: Secondary | ICD-10-CM | POA: Diagnosis not present

## 2018-02-02 DIAGNOSIS — G629 Polyneuropathy, unspecified: Secondary | ICD-10-CM | POA: Diagnosis not present

## 2018-02-02 DIAGNOSIS — M25532 Pain in left wrist: Secondary | ICD-10-CM | POA: Diagnosis not present

## 2018-02-02 DIAGNOSIS — E119 Type 2 diabetes mellitus without complications: Secondary | ICD-10-CM | POA: Diagnosis not present

## 2018-02-02 DIAGNOSIS — M5416 Radiculopathy, lumbar region: Secondary | ICD-10-CM | POA: Diagnosis not present

## 2018-02-02 DIAGNOSIS — G894 Chronic pain syndrome: Secondary | ICD-10-CM | POA: Diagnosis not present

## 2018-02-02 DIAGNOSIS — Z79891 Long term (current) use of opiate analgesic: Secondary | ICD-10-CM | POA: Diagnosis not present

## 2018-02-03 DIAGNOSIS — R69 Illness, unspecified: Secondary | ICD-10-CM | POA: Diagnosis not present

## 2018-02-03 DIAGNOSIS — M5134 Other intervertebral disc degeneration, thoracic region: Secondary | ICD-10-CM | POA: Diagnosis not present

## 2018-02-03 DIAGNOSIS — Z720 Tobacco use: Secondary | ICD-10-CM | POA: Diagnosis not present

## 2018-02-03 DIAGNOSIS — M5136 Other intervertebral disc degeneration, lumbar region: Secondary | ICD-10-CM | POA: Diagnosis not present

## 2018-02-03 DIAGNOSIS — G894 Chronic pain syndrome: Secondary | ICD-10-CM | POA: Diagnosis not present

## 2018-02-03 DIAGNOSIS — Z79891 Long term (current) use of opiate analgesic: Secondary | ICD-10-CM | POA: Diagnosis not present

## 2018-02-03 DIAGNOSIS — E119 Type 2 diabetes mellitus without complications: Secondary | ICD-10-CM | POA: Diagnosis not present

## 2018-02-03 DIAGNOSIS — M5416 Radiculopathy, lumbar region: Secondary | ICD-10-CM | POA: Diagnosis not present

## 2018-02-03 DIAGNOSIS — G629 Polyneuropathy, unspecified: Secondary | ICD-10-CM | POA: Diagnosis not present

## 2018-02-03 DIAGNOSIS — M25532 Pain in left wrist: Secondary | ICD-10-CM | POA: Diagnosis not present

## 2018-02-10 DIAGNOSIS — E1165 Type 2 diabetes mellitus with hyperglycemia: Secondary | ICD-10-CM | POA: Diagnosis not present

## 2018-03-09 DIAGNOSIS — R69 Illness, unspecified: Secondary | ICD-10-CM | POA: Diagnosis not present

## 2018-03-09 DIAGNOSIS — Z720 Tobacco use: Secondary | ICD-10-CM | POA: Diagnosis not present

## 2018-03-09 DIAGNOSIS — M25532 Pain in left wrist: Secondary | ICD-10-CM | POA: Diagnosis not present

## 2018-03-09 DIAGNOSIS — M5416 Radiculopathy, lumbar region: Secondary | ICD-10-CM | POA: Diagnosis not present

## 2018-03-09 DIAGNOSIS — G629 Polyneuropathy, unspecified: Secondary | ICD-10-CM | POA: Diagnosis not present

## 2018-03-09 DIAGNOSIS — M5136 Other intervertebral disc degeneration, lumbar region: Secondary | ICD-10-CM | POA: Diagnosis not present

## 2018-03-09 DIAGNOSIS — E119 Type 2 diabetes mellitus without complications: Secondary | ICD-10-CM | POA: Diagnosis not present

## 2018-03-09 DIAGNOSIS — Z79891 Long term (current) use of opiate analgesic: Secondary | ICD-10-CM | POA: Diagnosis not present

## 2018-03-09 DIAGNOSIS — G894 Chronic pain syndrome: Secondary | ICD-10-CM | POA: Diagnosis not present

## 2018-03-09 DIAGNOSIS — M5134 Other intervertebral disc degeneration, thoracic region: Secondary | ICD-10-CM | POA: Diagnosis not present

## 2018-03-22 DIAGNOSIS — R51 Headache: Secondary | ICD-10-CM | POA: Diagnosis not present

## 2018-03-22 DIAGNOSIS — R569 Unspecified convulsions: Secondary | ICD-10-CM | POA: Diagnosis not present

## 2018-03-22 DIAGNOSIS — R9082 White matter disease, unspecified: Secondary | ICD-10-CM | POA: Diagnosis not present

## 2018-03-29 DIAGNOSIS — G35 Multiple sclerosis: Secondary | ICD-10-CM | POA: Diagnosis not present

## 2018-03-29 DIAGNOSIS — R69 Illness, unspecified: Secondary | ICD-10-CM | POA: Diagnosis not present

## 2018-03-29 DIAGNOSIS — E119 Type 2 diabetes mellitus without complications: Secondary | ICD-10-CM | POA: Diagnosis not present

## 2018-03-29 DIAGNOSIS — D509 Iron deficiency anemia, unspecified: Secondary | ICD-10-CM | POA: Diagnosis not present

## 2018-03-29 DIAGNOSIS — Z6833 Body mass index (BMI) 33.0-33.9, adult: Secondary | ICD-10-CM | POA: Diagnosis not present

## 2018-03-29 DIAGNOSIS — M069 Rheumatoid arthritis, unspecified: Secondary | ICD-10-CM | POA: Diagnosis not present

## 2018-03-29 DIAGNOSIS — G40909 Epilepsy, unspecified, not intractable, without status epilepticus: Secondary | ICD-10-CM | POA: Diagnosis not present

## 2018-03-29 DIAGNOSIS — G8929 Other chronic pain: Secondary | ICD-10-CM | POA: Diagnosis not present

## 2018-03-29 DIAGNOSIS — I10 Essential (primary) hypertension: Secondary | ICD-10-CM | POA: Diagnosis not present

## 2018-03-29 DIAGNOSIS — G43909 Migraine, unspecified, not intractable, without status migrainosus: Secondary | ICD-10-CM | POA: Diagnosis not present

## 2018-04-11 DIAGNOSIS — E119 Type 2 diabetes mellitus without complications: Secondary | ICD-10-CM | POA: Diagnosis not present

## 2018-04-11 DIAGNOSIS — G629 Polyneuropathy, unspecified: Secondary | ICD-10-CM | POA: Diagnosis not present

## 2018-04-11 DIAGNOSIS — M5416 Radiculopathy, lumbar region: Secondary | ICD-10-CM | POA: Diagnosis not present

## 2018-04-11 DIAGNOSIS — Z79891 Long term (current) use of opiate analgesic: Secondary | ICD-10-CM | POA: Diagnosis not present

## 2018-04-11 DIAGNOSIS — M25532 Pain in left wrist: Secondary | ICD-10-CM | POA: Diagnosis not present

## 2018-04-11 DIAGNOSIS — Z720 Tobacco use: Secondary | ICD-10-CM | POA: Diagnosis not present

## 2018-04-11 DIAGNOSIS — G894 Chronic pain syndrome: Secondary | ICD-10-CM | POA: Diagnosis not present

## 2018-04-11 DIAGNOSIS — M5134 Other intervertebral disc degeneration, thoracic region: Secondary | ICD-10-CM | POA: Diagnosis not present

## 2018-04-11 DIAGNOSIS — R69 Illness, unspecified: Secondary | ICD-10-CM | POA: Diagnosis not present

## 2018-04-11 DIAGNOSIS — M5136 Other intervertebral disc degeneration, lumbar region: Secondary | ICD-10-CM | POA: Diagnosis not present

## 2018-04-12 DIAGNOSIS — R51 Headache: Secondary | ICD-10-CM | POA: Diagnosis not present

## 2018-04-12 DIAGNOSIS — R569 Unspecified convulsions: Secondary | ICD-10-CM | POA: Diagnosis not present

## 2018-04-12 DIAGNOSIS — R9082 White matter disease, unspecified: Secondary | ICD-10-CM | POA: Diagnosis not present

## 2018-04-20 DIAGNOSIS — E1165 Type 2 diabetes mellitus with hyperglycemia: Secondary | ICD-10-CM | POA: Diagnosis not present

## 2018-04-20 DIAGNOSIS — Z794 Long term (current) use of insulin: Secondary | ICD-10-CM | POA: Diagnosis not present

## 2018-05-02 DIAGNOSIS — R69 Illness, unspecified: Secondary | ICD-10-CM | POA: Diagnosis not present

## 2018-05-03 DIAGNOSIS — E119 Type 2 diabetes mellitus without complications: Secondary | ICD-10-CM | POA: Diagnosis not present

## 2018-05-03 DIAGNOSIS — R69 Illness, unspecified: Secondary | ICD-10-CM | POA: Diagnosis not present

## 2018-05-03 DIAGNOSIS — G629 Polyneuropathy, unspecified: Secondary | ICD-10-CM | POA: Diagnosis not present

## 2018-05-03 DIAGNOSIS — G894 Chronic pain syndrome: Secondary | ICD-10-CM | POA: Diagnosis not present

## 2018-05-03 DIAGNOSIS — Z720 Tobacco use: Secondary | ICD-10-CM | POA: Diagnosis not present

## 2018-05-03 DIAGNOSIS — M5134 Other intervertebral disc degeneration, thoracic region: Secondary | ICD-10-CM | POA: Diagnosis not present

## 2018-05-03 DIAGNOSIS — M5136 Other intervertebral disc degeneration, lumbar region: Secondary | ICD-10-CM | POA: Diagnosis not present

## 2018-05-03 DIAGNOSIS — M25532 Pain in left wrist: Secondary | ICD-10-CM | POA: Diagnosis not present

## 2018-05-03 DIAGNOSIS — M5416 Radiculopathy, lumbar region: Secondary | ICD-10-CM | POA: Diagnosis not present

## 2018-05-03 DIAGNOSIS — Z79891 Long term (current) use of opiate analgesic: Secondary | ICD-10-CM | POA: Diagnosis not present

## 2018-05-03 DIAGNOSIS — F1120 Opioid dependence, uncomplicated: Secondary | ICD-10-CM | POA: Diagnosis not present

## 2018-06-09 DIAGNOSIS — M25532 Pain in left wrist: Secondary | ICD-10-CM | POA: Diagnosis not present

## 2018-06-09 DIAGNOSIS — R69 Illness, unspecified: Secondary | ICD-10-CM | POA: Diagnosis not present

## 2018-06-09 DIAGNOSIS — M5416 Radiculopathy, lumbar region: Secondary | ICD-10-CM | POA: Diagnosis not present

## 2018-06-09 DIAGNOSIS — G894 Chronic pain syndrome: Secondary | ICD-10-CM | POA: Diagnosis not present

## 2018-06-09 DIAGNOSIS — Z79891 Long term (current) use of opiate analgesic: Secondary | ICD-10-CM | POA: Diagnosis not present

## 2018-06-09 DIAGNOSIS — M5134 Other intervertebral disc degeneration, thoracic region: Secondary | ICD-10-CM | POA: Diagnosis not present

## 2018-06-09 DIAGNOSIS — M5136 Other intervertebral disc degeneration, lumbar region: Secondary | ICD-10-CM | POA: Diagnosis not present

## 2018-06-09 DIAGNOSIS — E119 Type 2 diabetes mellitus without complications: Secondary | ICD-10-CM | POA: Diagnosis not present

## 2018-06-09 DIAGNOSIS — Z720 Tobacco use: Secondary | ICD-10-CM | POA: Diagnosis not present

## 2018-06-09 DIAGNOSIS — G629 Polyneuropathy, unspecified: Secondary | ICD-10-CM | POA: Diagnosis not present

## 2018-07-12 DIAGNOSIS — E119 Type 2 diabetes mellitus without complications: Secondary | ICD-10-CM | POA: Diagnosis not present

## 2018-07-12 DIAGNOSIS — M5416 Radiculopathy, lumbar region: Secondary | ICD-10-CM | POA: Diagnosis not present

## 2018-07-12 DIAGNOSIS — R69 Illness, unspecified: Secondary | ICD-10-CM | POA: Diagnosis not present

## 2018-07-12 DIAGNOSIS — Z79891 Long term (current) use of opiate analgesic: Secondary | ICD-10-CM | POA: Diagnosis not present

## 2018-07-12 DIAGNOSIS — G894 Chronic pain syndrome: Secondary | ICD-10-CM | POA: Diagnosis not present

## 2018-07-12 DIAGNOSIS — M5136 Other intervertebral disc degeneration, lumbar region: Secondary | ICD-10-CM | POA: Diagnosis not present

## 2018-07-12 DIAGNOSIS — M25532 Pain in left wrist: Secondary | ICD-10-CM | POA: Diagnosis not present

## 2018-07-12 DIAGNOSIS — Z720 Tobacco use: Secondary | ICD-10-CM | POA: Diagnosis not present

## 2018-07-12 DIAGNOSIS — M5134 Other intervertebral disc degeneration, thoracic region: Secondary | ICD-10-CM | POA: Diagnosis not present

## 2018-07-12 DIAGNOSIS — G629 Polyneuropathy, unspecified: Secondary | ICD-10-CM | POA: Diagnosis not present

## 2018-07-27 DIAGNOSIS — E119 Type 2 diabetes mellitus without complications: Secondary | ICD-10-CM | POA: Diagnosis not present

## 2018-07-27 DIAGNOSIS — I10 Essential (primary) hypertension: Secondary | ICD-10-CM | POA: Diagnosis not present

## 2018-07-27 DIAGNOSIS — R69 Illness, unspecified: Secondary | ICD-10-CM | POA: Diagnosis not present

## 2018-07-27 DIAGNOSIS — M069 Rheumatoid arthritis, unspecified: Secondary | ICD-10-CM | POA: Diagnosis not present

## 2018-07-27 DIAGNOSIS — G40909 Epilepsy, unspecified, not intractable, without status epilepticus: Secondary | ICD-10-CM | POA: Diagnosis not present

## 2018-07-27 DIAGNOSIS — G43909 Migraine, unspecified, not intractable, without status migrainosus: Secondary | ICD-10-CM | POA: Diagnosis not present

## 2018-07-27 DIAGNOSIS — D509 Iron deficiency anemia, unspecified: Secondary | ICD-10-CM | POA: Diagnosis not present

## 2018-07-27 DIAGNOSIS — G35 Multiple sclerosis: Secondary | ICD-10-CM | POA: Diagnosis not present

## 2018-08-09 DIAGNOSIS — G629 Polyneuropathy, unspecified: Secondary | ICD-10-CM | POA: Diagnosis not present

## 2018-08-09 DIAGNOSIS — G894 Chronic pain syndrome: Secondary | ICD-10-CM | POA: Diagnosis not present

## 2018-08-09 DIAGNOSIS — M25532 Pain in left wrist: Secondary | ICD-10-CM | POA: Diagnosis not present

## 2018-08-09 DIAGNOSIS — M5136 Other intervertebral disc degeneration, lumbar region: Secondary | ICD-10-CM | POA: Diagnosis not present

## 2018-08-09 DIAGNOSIS — M5416 Radiculopathy, lumbar region: Secondary | ICD-10-CM | POA: Diagnosis not present

## 2018-08-09 DIAGNOSIS — M5134 Other intervertebral disc degeneration, thoracic region: Secondary | ICD-10-CM | POA: Diagnosis not present

## 2018-08-09 DIAGNOSIS — R69 Illness, unspecified: Secondary | ICD-10-CM | POA: Diagnosis not present

## 2018-08-09 DIAGNOSIS — Z79891 Long term (current) use of opiate analgesic: Secondary | ICD-10-CM | POA: Diagnosis not present

## 2018-08-09 DIAGNOSIS — E119 Type 2 diabetes mellitus without complications: Secondary | ICD-10-CM | POA: Diagnosis not present

## 2018-08-09 DIAGNOSIS — Z720 Tobacco use: Secondary | ICD-10-CM | POA: Diagnosis not present

## 2018-08-12 IMAGING — CT CT ABD-PELV W/ CM
2 of 5 series · 17 of 46 positions shown, 19 images · IV contrast (APPLIED)
Comparison: 02/29/16

CLINICAL DATA: Lower abdominal pain for 1 week

EXAM:
CT ABDOMEN AND PELVIS WITH CONTRAST
TECHNIQUE: Multidetector CT imaging of the abdomen and pelvis was performed
using the standard protocol following bolus administration of
intravenous contrast.
CONTRAST:  100mL LT4B7D-9PP IOPAMIDOL (LT4B7D-9PP) INJECTION 61%

[Series 2: abd/ pelvis 5.0 i30f 1 · axial · 0.90mm/px · z∈[-480,-50]mm · 14 of 98 slices shown, 16 images]
[im 6/98  soft-tissue]
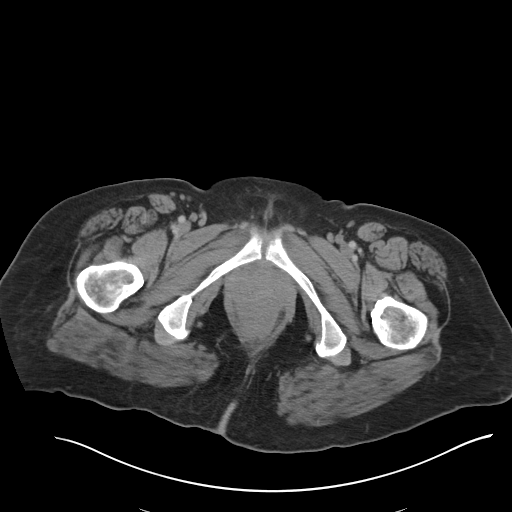
[im 6/98  bone]
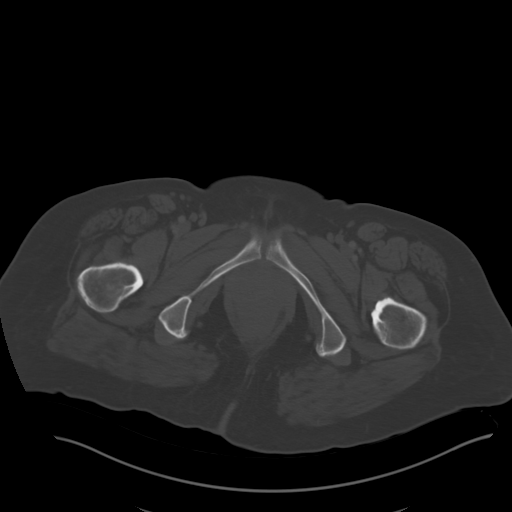
[im 11/98  soft-tissue]
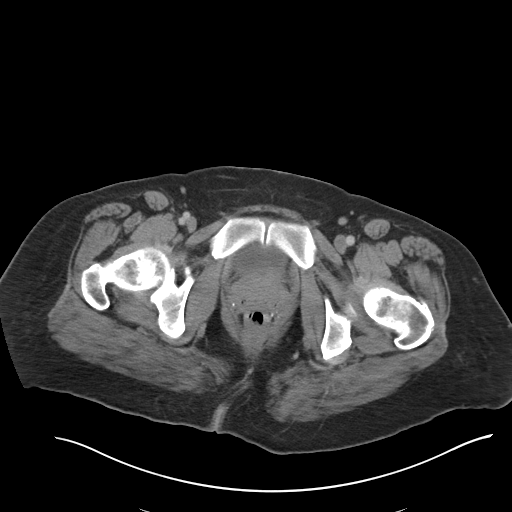
[im 21/98  soft-tissue]
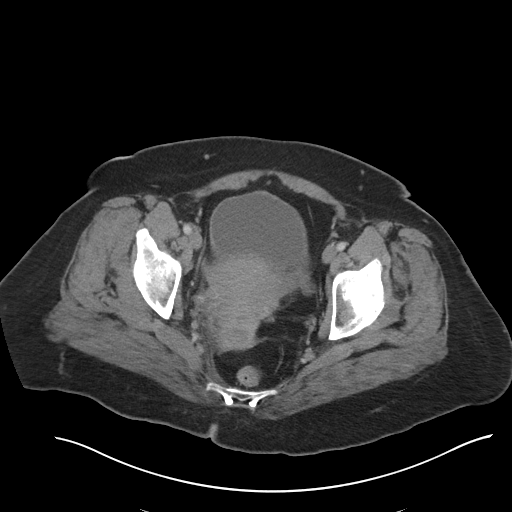
[im 26/98  soft-tissue]
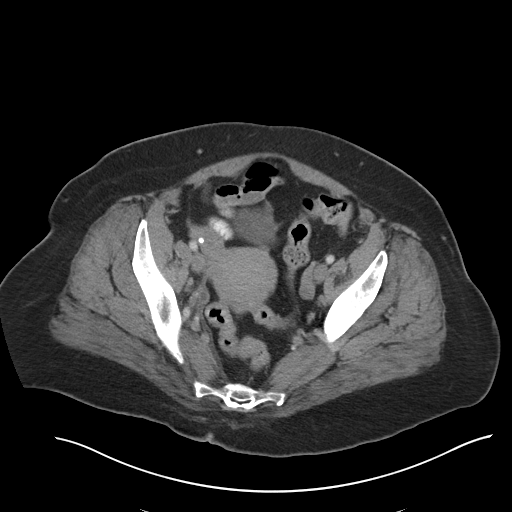
[im 31/98  soft-tissue]
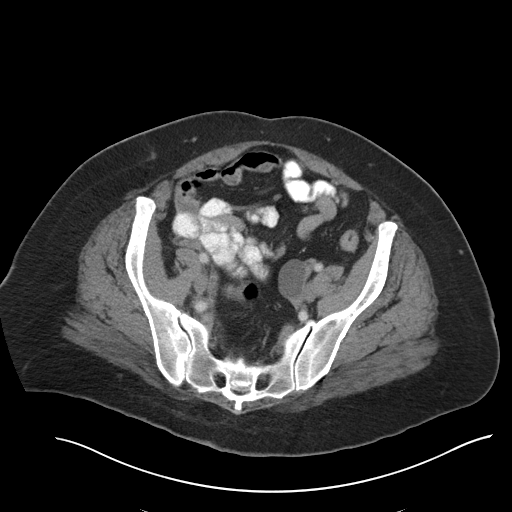
[im 41/98  soft-tissue]
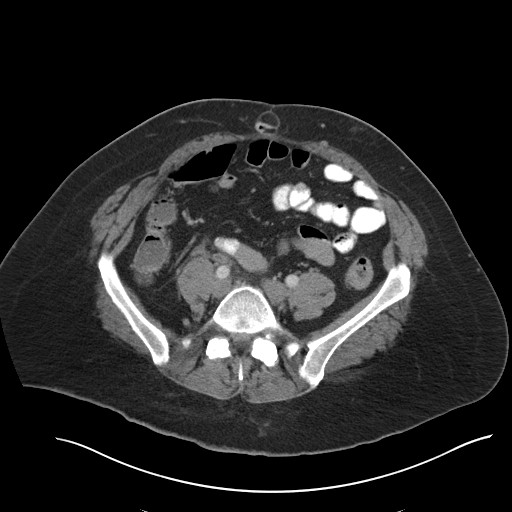
[im 46/98  soft-tissue]
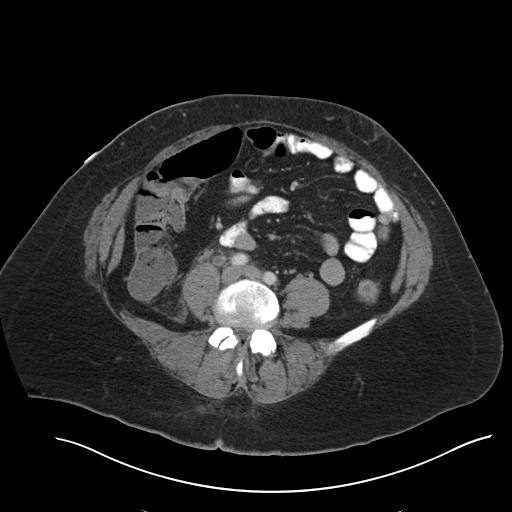
[im 52/98  soft-tissue]
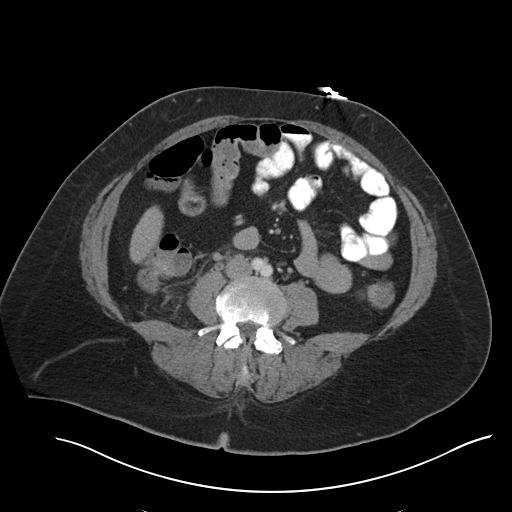
[im 57/98  soft-tissue]
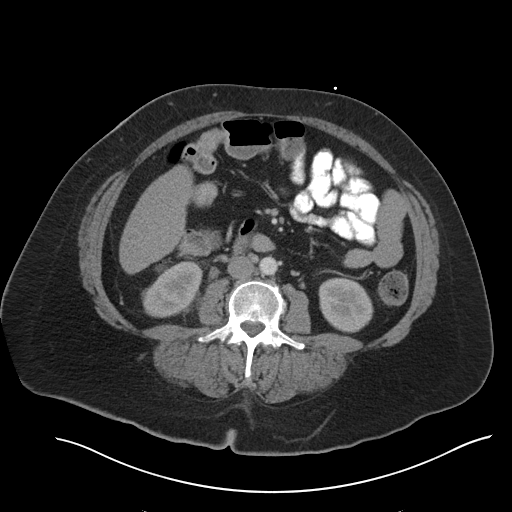
[im 57/98  bone]
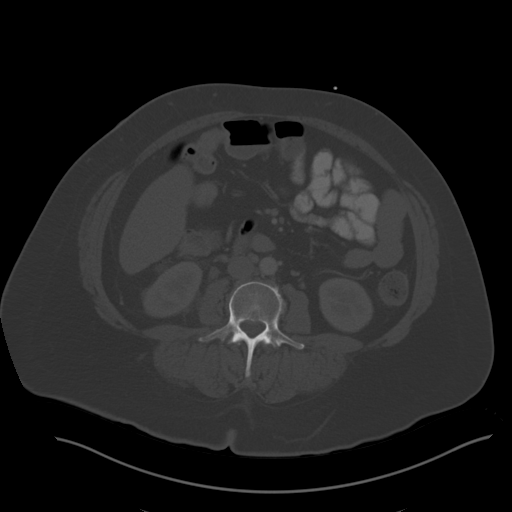
[im 67/98  soft-tissue]
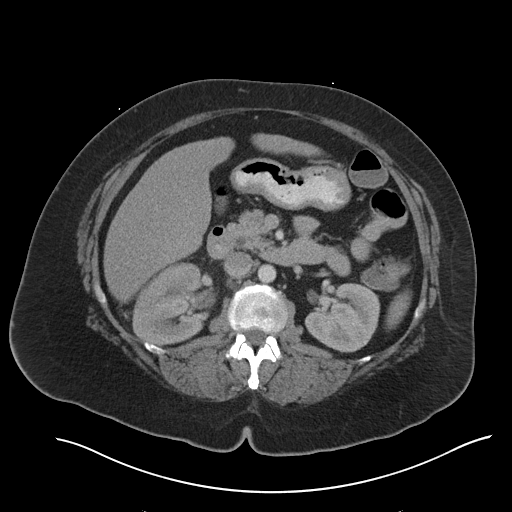
[im 72/98  soft-tissue]
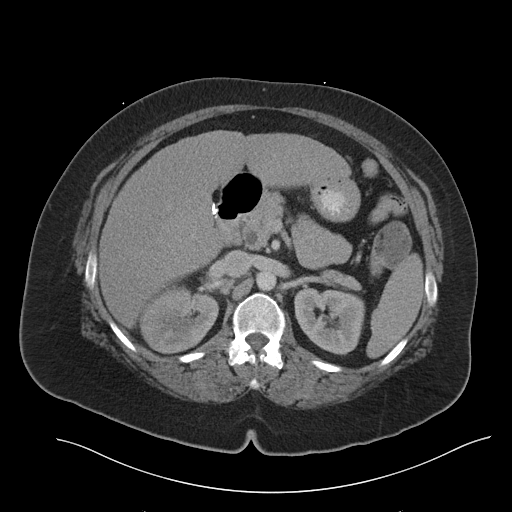
[im 77/98  soft-tissue]
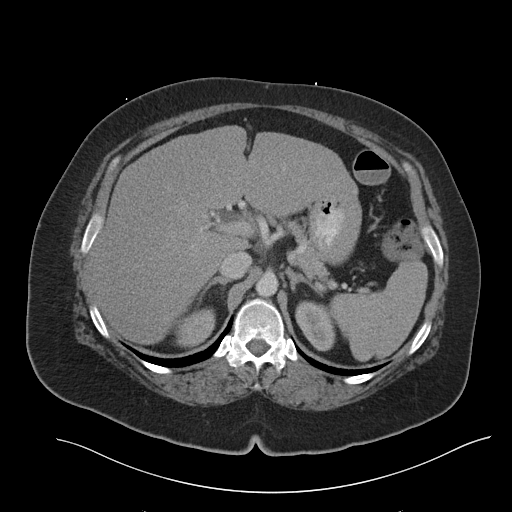
[im 87/98  soft-tissue]
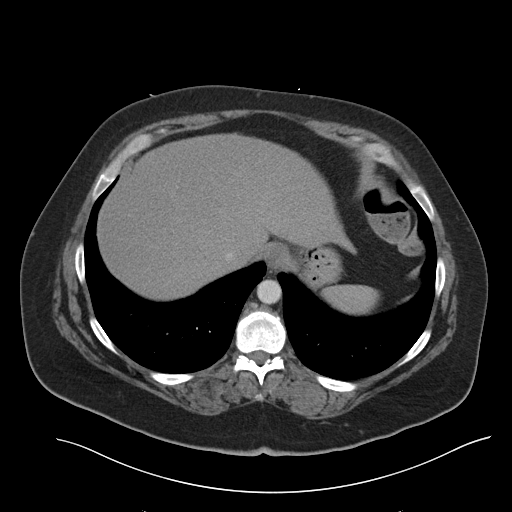
[im 92/98  soft-tissue]
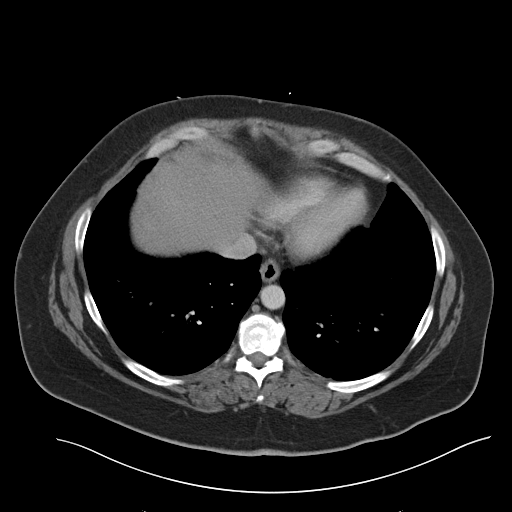

[Series 5: coronal soft tissue · coronal · 0.97mm/px · 3 of 115 slices shown]
[im 39/115  soft-tissue]
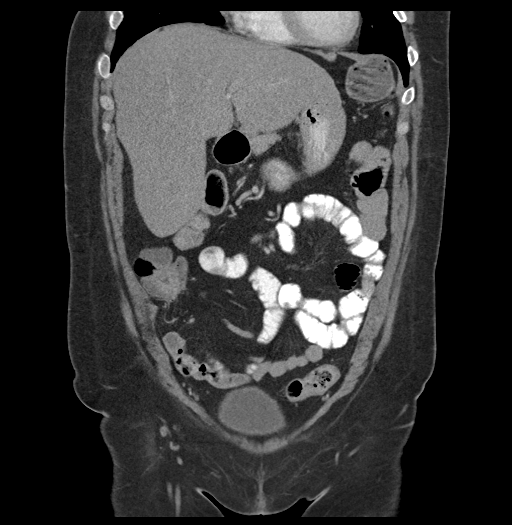
[im 51/115  soft-tissue]
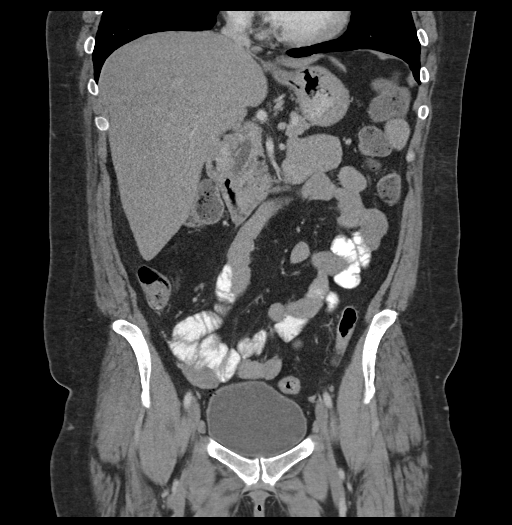
[im 64/115  soft-tissue]
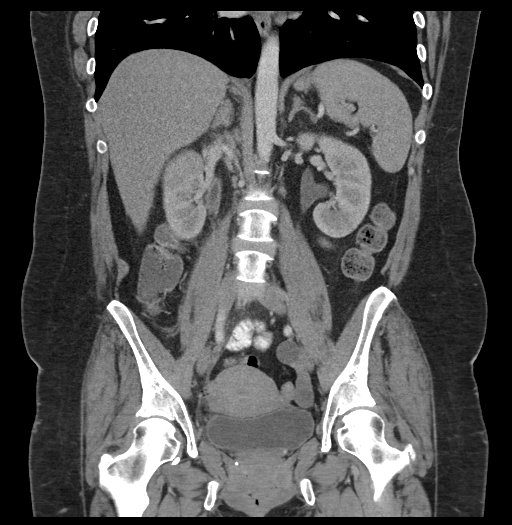

[17 of 46 positions shown; findings below may reference images not displayed]

FINDINGS: Lower chest: No acute abnormality.

Hepatobiliary: Liver is diffusely fatty infiltrated. The gallbladder
has been surgically removed.

Pancreas: Unremarkable. No pancreatic ductal dilatation or
surrounding inflammatory changes.

Spleen: Normal in size without focal abnormality.

Adrenals/Urinary Tract: The adrenal glands and left kidney are
within normal limits. Right kidney demonstrates some decreased
enhancement and perinephric stranding. Delayed images demonstrate
geographic large area of decreased attenuation and enhancement.
These changes are consistent with pyelonephritis. These are similar
but somewhat more marked than that seen on the prior exam. Mild
fullness of the right collecting system is noted without definitive
calculus. Bladder is well distended.

Stomach/Bowel: Stomach is within normal limits. Appendix appears
normal. No evidence of bowel wall thickening, distention, or
inflammatory changes.

Vascular/Lymphatic: No significant vascular findings are present. No
enlarged abdominal or pelvic lymph nodes.

Reproductive: Uterus and bilateral adnexa are unremarkable. Changes
of tubal ligation are noted.

Other: Small fat containing umbilical hernia is seen. No free pelvic
fluid is noted.

Musculoskeletal: Degenerative changes of lumbar spine are noted.
IMPRESSION: Changes consistent with right pyelonephritis. The area of poor
enhancement is increased in size when compared with the prior exam.

Chronic changes as described above.

## 2018-08-24 DIAGNOSIS — R69 Illness, unspecified: Secondary | ICD-10-CM | POA: Diagnosis not present

## 2018-08-24 DIAGNOSIS — S40812A Abrasion of left upper arm, initial encounter: Secondary | ICD-10-CM | POA: Diagnosis not present

## 2018-08-24 DIAGNOSIS — J449 Chronic obstructive pulmonary disease, unspecified: Secondary | ICD-10-CM | POA: Diagnosis not present

## 2018-08-24 DIAGNOSIS — Z79899 Other long term (current) drug therapy: Secondary | ICD-10-CM | POA: Diagnosis not present

## 2018-08-24 DIAGNOSIS — Z20828 Contact with and (suspected) exposure to other viral communicable diseases: Secondary | ICD-10-CM | POA: Diagnosis not present

## 2018-08-24 DIAGNOSIS — K219 Gastro-esophageal reflux disease without esophagitis: Secondary | ICD-10-CM | POA: Diagnosis not present

## 2018-08-24 DIAGNOSIS — Z6822 Body mass index (BMI) 22.0-22.9, adult: Secondary | ICD-10-CM | POA: Diagnosis not present

## 2018-08-24 DIAGNOSIS — M159 Polyosteoarthritis, unspecified: Secondary | ICD-10-CM | POA: Diagnosis not present

## 2018-08-24 DIAGNOSIS — E785 Hyperlipidemia, unspecified: Secondary | ICD-10-CM | POA: Diagnosis not present

## 2018-08-24 DIAGNOSIS — I10 Essential (primary) hypertension: Secondary | ICD-10-CM | POA: Diagnosis not present

## 2018-08-24 DIAGNOSIS — D692 Other nonthrombocytopenic purpura: Secondary | ICD-10-CM | POA: Diagnosis not present

## 2018-09-06 DIAGNOSIS — Z1389 Encounter for screening for other disorder: Secondary | ICD-10-CM | POA: Diagnosis not present

## 2018-09-06 DIAGNOSIS — M25532 Pain in left wrist: Secondary | ICD-10-CM | POA: Diagnosis not present

## 2018-09-06 DIAGNOSIS — G629 Polyneuropathy, unspecified: Secondary | ICD-10-CM | POA: Diagnosis not present

## 2018-09-06 DIAGNOSIS — R69 Illness, unspecified: Secondary | ICD-10-CM | POA: Diagnosis not present

## 2018-09-06 DIAGNOSIS — G894 Chronic pain syndrome: Secondary | ICD-10-CM | POA: Diagnosis not present

## 2018-09-06 DIAGNOSIS — Z79891 Long term (current) use of opiate analgesic: Secondary | ICD-10-CM | POA: Diagnosis not present

## 2018-09-06 DIAGNOSIS — M5136 Other intervertebral disc degeneration, lumbar region: Secondary | ICD-10-CM | POA: Diagnosis not present

## 2018-09-06 DIAGNOSIS — M5416 Radiculopathy, lumbar region: Secondary | ICD-10-CM | POA: Diagnosis not present

## 2018-09-06 DIAGNOSIS — M5134 Other intervertebral disc degeneration, thoracic region: Secondary | ICD-10-CM | POA: Diagnosis not present

## 2018-09-06 DIAGNOSIS — E119 Type 2 diabetes mellitus without complications: Secondary | ICD-10-CM | POA: Diagnosis not present

## 2018-09-28 DIAGNOSIS — Z79891 Long term (current) use of opiate analgesic: Secondary | ICD-10-CM | POA: Diagnosis not present

## 2018-09-28 DIAGNOSIS — M25532 Pain in left wrist: Secondary | ICD-10-CM | POA: Diagnosis not present

## 2018-09-28 DIAGNOSIS — M5416 Radiculopathy, lumbar region: Secondary | ICD-10-CM | POA: Diagnosis not present

## 2018-09-28 DIAGNOSIS — G629 Polyneuropathy, unspecified: Secondary | ICD-10-CM | POA: Diagnosis not present

## 2018-09-28 DIAGNOSIS — R69 Illness, unspecified: Secondary | ICD-10-CM | POA: Diagnosis not present

## 2018-09-28 DIAGNOSIS — M5136 Other intervertebral disc degeneration, lumbar region: Secondary | ICD-10-CM | POA: Diagnosis not present

## 2018-09-28 DIAGNOSIS — M5134 Other intervertebral disc degeneration, thoracic region: Secondary | ICD-10-CM | POA: Diagnosis not present

## 2018-09-28 DIAGNOSIS — Z1389 Encounter for screening for other disorder: Secondary | ICD-10-CM | POA: Diagnosis not present

## 2018-09-28 DIAGNOSIS — G894 Chronic pain syndrome: Secondary | ICD-10-CM | POA: Diagnosis not present

## 2018-09-28 DIAGNOSIS — E119 Type 2 diabetes mellitus without complications: Secondary | ICD-10-CM | POA: Diagnosis not present

## 2018-10-27 DIAGNOSIS — R51 Headache: Secondary | ICD-10-CM | POA: Diagnosis not present

## 2018-10-27 DIAGNOSIS — R569 Unspecified convulsions: Secondary | ICD-10-CM | POA: Diagnosis not present

## 2018-11-02 DIAGNOSIS — Z1389 Encounter for screening for other disorder: Secondary | ICD-10-CM | POA: Diagnosis not present

## 2018-11-02 DIAGNOSIS — M5416 Radiculopathy, lumbar region: Secondary | ICD-10-CM | POA: Diagnosis not present

## 2018-11-02 DIAGNOSIS — G894 Chronic pain syndrome: Secondary | ICD-10-CM | POA: Diagnosis not present

## 2018-11-02 DIAGNOSIS — M5136 Other intervertebral disc degeneration, lumbar region: Secondary | ICD-10-CM | POA: Diagnosis not present

## 2018-11-23 DIAGNOSIS — M545 Low back pain: Secondary | ICD-10-CM | POA: Diagnosis not present

## 2018-11-30 DIAGNOSIS — M5416 Radiculopathy, lumbar region: Secondary | ICD-10-CM | POA: Diagnosis not present

## 2018-11-30 DIAGNOSIS — M5136 Other intervertebral disc degeneration, lumbar region: Secondary | ICD-10-CM | POA: Diagnosis not present

## 2018-11-30 DIAGNOSIS — Z1389 Encounter for screening for other disorder: Secondary | ICD-10-CM | POA: Diagnosis not present

## 2018-11-30 DIAGNOSIS — G894 Chronic pain syndrome: Secondary | ICD-10-CM | POA: Diagnosis not present

## 2019-01-04 DIAGNOSIS — Z1389 Encounter for screening for other disorder: Secondary | ICD-10-CM | POA: Diagnosis not present

## 2019-01-04 DIAGNOSIS — G894 Chronic pain syndrome: Secondary | ICD-10-CM | POA: Diagnosis not present

## 2019-01-04 DIAGNOSIS — M5416 Radiculopathy, lumbar region: Secondary | ICD-10-CM | POA: Diagnosis not present

## 2019-01-04 DIAGNOSIS — M5136 Other intervertebral disc degeneration, lumbar region: Secondary | ICD-10-CM | POA: Diagnosis not present

## 2019-01-17 DIAGNOSIS — E1165 Type 2 diabetes mellitus with hyperglycemia: Secondary | ICD-10-CM | POA: Diagnosis not present

## 2019-01-17 DIAGNOSIS — Z794 Long term (current) use of insulin: Secondary | ICD-10-CM | POA: Diagnosis not present

## 2019-01-17 DIAGNOSIS — R69 Illness, unspecified: Secondary | ICD-10-CM | POA: Diagnosis not present

## 2019-01-18 DIAGNOSIS — D509 Iron deficiency anemia, unspecified: Secondary | ICD-10-CM | POA: Diagnosis not present

## 2019-01-18 DIAGNOSIS — E119 Type 2 diabetes mellitus without complications: Secondary | ICD-10-CM | POA: Diagnosis not present

## 2019-01-18 DIAGNOSIS — Z6832 Body mass index (BMI) 32.0-32.9, adult: Secondary | ICD-10-CM | POA: Diagnosis not present

## 2019-01-18 DIAGNOSIS — R69 Illness, unspecified: Secondary | ICD-10-CM | POA: Diagnosis not present

## 2019-01-18 DIAGNOSIS — I10 Essential (primary) hypertension: Secondary | ICD-10-CM | POA: Diagnosis not present

## 2019-01-18 DIAGNOSIS — M069 Rheumatoid arthritis, unspecified: Secondary | ICD-10-CM | POA: Diagnosis not present

## 2019-01-18 DIAGNOSIS — G40909 Epilepsy, unspecified, not intractable, without status epilepticus: Secondary | ICD-10-CM | POA: Diagnosis not present

## 2019-01-18 DIAGNOSIS — G43909 Migraine, unspecified, not intractable, without status migrainosus: Secondary | ICD-10-CM | POA: Diagnosis not present

## 2019-01-19 DIAGNOSIS — R519 Headache, unspecified: Secondary | ICD-10-CM | POA: Diagnosis not present

## 2019-01-19 DIAGNOSIS — R569 Unspecified convulsions: Secondary | ICD-10-CM | POA: Diagnosis not present

## 2019-02-08 DIAGNOSIS — M5416 Radiculopathy, lumbar region: Secondary | ICD-10-CM | POA: Diagnosis not present

## 2019-02-08 DIAGNOSIS — G894 Chronic pain syndrome: Secondary | ICD-10-CM | POA: Diagnosis not present

## 2019-02-08 DIAGNOSIS — Z1389 Encounter for screening for other disorder: Secondary | ICD-10-CM | POA: Diagnosis not present

## 2019-02-08 DIAGNOSIS — M5136 Other intervertebral disc degeneration, lumbar region: Secondary | ICD-10-CM | POA: Diagnosis not present

## 2019-05-03 DIAGNOSIS — Z8673 Personal history of transient ischemic attack (TIA), and cerebral infarction without residual deficits: Secondary | ICD-10-CM | POA: Diagnosis not present

## 2019-05-03 DIAGNOSIS — M069 Rheumatoid arthritis, unspecified: Secondary | ICD-10-CM | POA: Diagnosis not present

## 2019-05-03 DIAGNOSIS — D649 Anemia, unspecified: Secondary | ICD-10-CM | POA: Diagnosis not present

## 2019-05-03 DIAGNOSIS — E8809 Other disorders of plasma-protein metabolism, not elsewhere classified: Secondary | ICD-10-CM | POA: Diagnosis not present

## 2019-05-03 DIAGNOSIS — D500 Iron deficiency anemia secondary to blood loss (chronic): Secondary | ICD-10-CM | POA: Insufficient documentation

## 2019-05-03 DIAGNOSIS — D509 Iron deficiency anemia, unspecified: Secondary | ICD-10-CM | POA: Diagnosis not present

## 2019-05-05 DIAGNOSIS — Z9181 History of falling: Secondary | ICD-10-CM | POA: Diagnosis not present

## 2019-05-05 DIAGNOSIS — E785 Hyperlipidemia, unspecified: Secondary | ICD-10-CM | POA: Diagnosis not present

## 2019-05-05 DIAGNOSIS — Z0000 Encounter for general adult medical examination without abnormal findings: Secondary | ICD-10-CM | POA: Diagnosis not present

## 2019-05-25 DIAGNOSIS — R4702 Dysphasia: Secondary | ICD-10-CM | POA: Diagnosis not present

## 2019-05-25 DIAGNOSIS — G894 Chronic pain syndrome: Secondary | ICD-10-CM | POA: Diagnosis not present

## 2019-05-25 DIAGNOSIS — M329 Systemic lupus erythematosus, unspecified: Secondary | ICD-10-CM | POA: Diagnosis not present

## 2019-05-25 DIAGNOSIS — G35 Multiple sclerosis: Secondary | ICD-10-CM | POA: Diagnosis not present

## 2019-05-25 DIAGNOSIS — M5136 Other intervertebral disc degeneration, lumbar region: Secondary | ICD-10-CM | POA: Diagnosis not present

## 2019-05-25 DIAGNOSIS — R519 Headache, unspecified: Secondary | ICD-10-CM | POA: Diagnosis not present

## 2019-06-21 DIAGNOSIS — D509 Iron deficiency anemia, unspecified: Secondary | ICD-10-CM | POA: Diagnosis not present

## 2019-06-22 DIAGNOSIS — M5136 Other intervertebral disc degeneration, lumbar region: Secondary | ICD-10-CM | POA: Diagnosis not present

## 2019-06-22 DIAGNOSIS — R4702 Dysphasia: Secondary | ICD-10-CM | POA: Diagnosis not present

## 2019-06-22 DIAGNOSIS — R519 Headache, unspecified: Secondary | ICD-10-CM | POA: Diagnosis not present

## 2019-06-22 DIAGNOSIS — G35 Multiple sclerosis: Secondary | ICD-10-CM | POA: Diagnosis not present

## 2019-06-22 DIAGNOSIS — G894 Chronic pain syndrome: Secondary | ICD-10-CM | POA: Diagnosis not present

## 2019-06-22 DIAGNOSIS — M329 Systemic lupus erythematosus, unspecified: Secondary | ICD-10-CM | POA: Diagnosis not present

## 2019-06-29 DIAGNOSIS — D509 Iron deficiency anemia, unspecified: Secondary | ICD-10-CM | POA: Diagnosis not present

## 2019-07-13 DIAGNOSIS — E119 Type 2 diabetes mellitus without complications: Secondary | ICD-10-CM | POA: Diagnosis not present

## 2019-07-13 DIAGNOSIS — D509 Iron deficiency anemia, unspecified: Secondary | ICD-10-CM | POA: Diagnosis not present

## 2019-07-13 DIAGNOSIS — M069 Rheumatoid arthritis, unspecified: Secondary | ICD-10-CM | POA: Diagnosis not present

## 2019-07-26 DIAGNOSIS — L930 Discoid lupus erythematosus: Secondary | ICD-10-CM | POA: Diagnosis not present

## 2019-07-26 DIAGNOSIS — G8929 Other chronic pain: Secondary | ICD-10-CM | POA: Diagnosis not present

## 2019-07-26 DIAGNOSIS — Z79899 Other long term (current) drug therapy: Secondary | ICD-10-CM | POA: Diagnosis not present

## 2019-07-26 DIAGNOSIS — R079 Chest pain, unspecified: Secondary | ICD-10-CM | POA: Diagnosis not present

## 2019-07-26 DIAGNOSIS — M069 Rheumatoid arthritis, unspecified: Secondary | ICD-10-CM | POA: Diagnosis not present

## 2019-07-26 DIAGNOSIS — R0602 Shortness of breath: Secondary | ICD-10-CM | POA: Diagnosis not present

## 2019-07-26 DIAGNOSIS — Z87891 Personal history of nicotine dependence: Secondary | ICD-10-CM | POA: Diagnosis not present

## 2019-07-26 DIAGNOSIS — I10 Essential (primary) hypertension: Secondary | ICD-10-CM | POA: Diagnosis not present

## 2019-07-26 DIAGNOSIS — Z7982 Long term (current) use of aspirin: Secondary | ICD-10-CM | POA: Diagnosis not present

## 2019-07-26 DIAGNOSIS — Z79891 Long term (current) use of opiate analgesic: Secondary | ICD-10-CM | POA: Diagnosis not present

## 2019-07-26 DIAGNOSIS — R509 Fever, unspecified: Secondary | ICD-10-CM | POA: Diagnosis not present

## 2019-07-26 DIAGNOSIS — J45909 Unspecified asthma, uncomplicated: Secondary | ICD-10-CM | POA: Diagnosis not present

## 2019-07-26 DIAGNOSIS — M1990 Unspecified osteoarthritis, unspecified site: Secondary | ICD-10-CM | POA: Diagnosis not present

## 2019-07-26 DIAGNOSIS — N3289 Other specified disorders of bladder: Secondary | ICD-10-CM | POA: Diagnosis not present

## 2019-07-26 DIAGNOSIS — R112 Nausea with vomiting, unspecified: Secondary | ICD-10-CM | POA: Diagnosis not present

## 2019-07-26 DIAGNOSIS — N390 Urinary tract infection, site not specified: Secondary | ICD-10-CM | POA: Diagnosis not present

## 2019-07-26 DIAGNOSIS — E119 Type 2 diabetes mellitus without complications: Secondary | ICD-10-CM | POA: Diagnosis not present

## 2019-07-26 DIAGNOSIS — B9689 Other specified bacterial agents as the cause of diseases classified elsewhere: Secondary | ICD-10-CM | POA: Diagnosis not present

## 2019-07-26 DIAGNOSIS — E860 Dehydration: Secondary | ICD-10-CM | POA: Diagnosis not present

## 2019-08-17 DIAGNOSIS — E1165 Type 2 diabetes mellitus with hyperglycemia: Secondary | ICD-10-CM | POA: Diagnosis not present

## 2019-08-17 DIAGNOSIS — Z794 Long term (current) use of insulin: Secondary | ICD-10-CM | POA: Diagnosis not present

## 2019-08-25 DIAGNOSIS — E118 Type 2 diabetes mellitus with unspecified complications: Secondary | ICD-10-CM | POA: Diagnosis not present

## 2019-08-25 DIAGNOSIS — Z794 Long term (current) use of insulin: Secondary | ICD-10-CM | POA: Diagnosis not present

## 2019-09-07 DIAGNOSIS — M5136 Other intervertebral disc degeneration, lumbar region: Secondary | ICD-10-CM | POA: Diagnosis not present

## 2019-09-07 DIAGNOSIS — G894 Chronic pain syndrome: Secondary | ICD-10-CM | POA: Diagnosis not present

## 2019-09-07 DIAGNOSIS — M329 Systemic lupus erythematosus, unspecified: Secondary | ICD-10-CM | POA: Diagnosis not present

## 2019-09-07 DIAGNOSIS — Z1389 Encounter for screening for other disorder: Secondary | ICD-10-CM | POA: Diagnosis not present

## 2019-09-07 DIAGNOSIS — G35 Multiple sclerosis: Secondary | ICD-10-CM | POA: Diagnosis not present

## 2019-09-24 DIAGNOSIS — Z794 Long term (current) use of insulin: Secondary | ICD-10-CM | POA: Diagnosis not present

## 2019-09-24 DIAGNOSIS — E118 Type 2 diabetes mellitus with unspecified complications: Secondary | ICD-10-CM | POA: Diagnosis not present

## 2019-10-16 DIAGNOSIS — G35 Multiple sclerosis: Secondary | ICD-10-CM | POA: Diagnosis not present

## 2019-10-16 DIAGNOSIS — M329 Systemic lupus erythematosus, unspecified: Secondary | ICD-10-CM | POA: Diagnosis not present

## 2019-10-25 DIAGNOSIS — E118 Type 2 diabetes mellitus with unspecified complications: Secondary | ICD-10-CM | POA: Diagnosis not present

## 2019-10-25 DIAGNOSIS — Z794 Long term (current) use of insulin: Secondary | ICD-10-CM | POA: Diagnosis not present

## 2019-11-10 DIAGNOSIS — G894 Chronic pain syndrome: Secondary | ICD-10-CM | POA: Diagnosis not present

## 2019-11-10 DIAGNOSIS — Z79891 Long term (current) use of opiate analgesic: Secondary | ICD-10-CM | POA: Diagnosis not present

## 2019-11-10 DIAGNOSIS — G35 Multiple sclerosis: Secondary | ICD-10-CM | POA: Diagnosis not present

## 2019-11-10 DIAGNOSIS — M329 Systemic lupus erythematosus, unspecified: Secondary | ICD-10-CM | POA: Diagnosis not present

## 2019-11-17 DIAGNOSIS — D508 Other iron deficiency anemias: Secondary | ICD-10-CM | POA: Diagnosis not present

## 2019-11-17 DIAGNOSIS — E538 Deficiency of other specified B group vitamins: Secondary | ICD-10-CM | POA: Diagnosis not present

## 2019-11-17 DIAGNOSIS — M069 Rheumatoid arthritis, unspecified: Secondary | ICD-10-CM | POA: Diagnosis not present

## 2019-11-17 DIAGNOSIS — G629 Polyneuropathy, unspecified: Secondary | ICD-10-CM | POA: Diagnosis not present

## 2019-11-17 DIAGNOSIS — Z8673 Personal history of transient ischemic attack (TIA), and cerebral infarction without residual deficits: Secondary | ICD-10-CM | POA: Diagnosis not present

## 2019-11-17 DIAGNOSIS — E559 Vitamin D deficiency, unspecified: Secondary | ICD-10-CM | POA: Diagnosis not present

## 2019-11-21 DIAGNOSIS — Z794 Long term (current) use of insulin: Secondary | ICD-10-CM | POA: Diagnosis not present

## 2019-11-21 DIAGNOSIS — E118 Type 2 diabetes mellitus with unspecified complications: Secondary | ICD-10-CM | POA: Diagnosis not present

## 2019-12-08 DIAGNOSIS — M329 Systemic lupus erythematosus, unspecified: Secondary | ICD-10-CM | POA: Diagnosis not present

## 2019-12-08 DIAGNOSIS — G894 Chronic pain syndrome: Secondary | ICD-10-CM | POA: Diagnosis not present

## 2019-12-08 DIAGNOSIS — G35 Multiple sclerosis: Secondary | ICD-10-CM | POA: Diagnosis not present

## 2019-12-21 DIAGNOSIS — E118 Type 2 diabetes mellitus with unspecified complications: Secondary | ICD-10-CM | POA: Diagnosis not present

## 2019-12-21 DIAGNOSIS — Z794 Long term (current) use of insulin: Secondary | ICD-10-CM | POA: Diagnosis not present

## 2020-01-05 DIAGNOSIS — Z79891 Long term (current) use of opiate analgesic: Secondary | ICD-10-CM | POA: Diagnosis not present

## 2020-01-05 DIAGNOSIS — M329 Systemic lupus erythematosus, unspecified: Secondary | ICD-10-CM | POA: Diagnosis not present

## 2020-01-05 DIAGNOSIS — G894 Chronic pain syndrome: Secondary | ICD-10-CM | POA: Diagnosis not present

## 2020-01-05 DIAGNOSIS — G35 Multiple sclerosis: Secondary | ICD-10-CM | POA: Diagnosis not present

## 2020-01-14 DIAGNOSIS — M25512 Pain in left shoulder: Secondary | ICD-10-CM | POA: Diagnosis not present

## 2020-01-14 DIAGNOSIS — E041 Nontoxic single thyroid nodule: Secondary | ICD-10-CM | POA: Diagnosis not present

## 2020-01-14 DIAGNOSIS — T887XXA Unspecified adverse effect of drug or medicament, initial encounter: Secondary | ICD-10-CM | POA: Diagnosis not present

## 2020-01-14 DIAGNOSIS — T50904A Poisoning by unspecified drugs, medicaments and biological substances, undetermined, initial encounter: Secondary | ICD-10-CM | POA: Diagnosis not present

## 2020-01-14 DIAGNOSIS — M545 Low back pain, unspecified: Secondary | ICD-10-CM | POA: Diagnosis not present

## 2020-01-14 DIAGNOSIS — R55 Syncope and collapse: Secondary | ICD-10-CM | POA: Diagnosis not present

## 2020-01-14 DIAGNOSIS — M25511 Pain in right shoulder: Secondary | ICD-10-CM | POA: Diagnosis not present

## 2020-01-14 DIAGNOSIS — R079 Chest pain, unspecified: Secondary | ICD-10-CM | POA: Diagnosis not present

## 2020-01-14 DIAGNOSIS — M79621 Pain in right upper arm: Secondary | ICD-10-CM | POA: Diagnosis not present

## 2020-01-14 DIAGNOSIS — R52 Pain, unspecified: Secondary | ICD-10-CM | POA: Diagnosis not present

## 2020-01-14 DIAGNOSIS — R000 Tachycardia, unspecified: Secondary | ICD-10-CM | POA: Diagnosis not present

## 2020-01-14 DIAGNOSIS — M546 Pain in thoracic spine: Secondary | ICD-10-CM | POA: Diagnosis not present

## 2020-01-14 DIAGNOSIS — R590 Localized enlarged lymph nodes: Secondary | ICD-10-CM | POA: Diagnosis not present

## 2020-01-14 DIAGNOSIS — M79622 Pain in left upper arm: Secondary | ICD-10-CM | POA: Diagnosis not present

## 2020-01-14 DIAGNOSIS — J9811 Atelectasis: Secondary | ICD-10-CM | POA: Diagnosis not present

## 2020-01-19 DIAGNOSIS — M069 Rheumatoid arthritis, unspecified: Secondary | ICD-10-CM | POA: Diagnosis not present

## 2020-01-19 DIAGNOSIS — E119 Type 2 diabetes mellitus without complications: Secondary | ICD-10-CM | POA: Diagnosis not present

## 2020-01-19 DIAGNOSIS — F32A Depression, unspecified: Secondary | ICD-10-CM | POA: Diagnosis not present

## 2020-01-21 DIAGNOSIS — Z794 Long term (current) use of insulin: Secondary | ICD-10-CM | POA: Diagnosis not present

## 2020-01-21 DIAGNOSIS — E118 Type 2 diabetes mellitus with unspecified complications: Secondary | ICD-10-CM | POA: Diagnosis not present

## 2020-02-09 DIAGNOSIS — G35 Multiple sclerosis: Secondary | ICD-10-CM | POA: Diagnosis not present

## 2020-02-09 DIAGNOSIS — G894 Chronic pain syndrome: Secondary | ICD-10-CM | POA: Diagnosis not present

## 2020-02-09 DIAGNOSIS — Z79891 Long term (current) use of opiate analgesic: Secondary | ICD-10-CM | POA: Diagnosis not present

## 2020-02-09 DIAGNOSIS — M329 Systemic lupus erythematosus, unspecified: Secondary | ICD-10-CM | POA: Diagnosis not present

## 2020-03-22 DIAGNOSIS — M5136 Other intervertebral disc degeneration, lumbar region: Secondary | ICD-10-CM | POA: Diagnosis not present

## 2020-03-22 DIAGNOSIS — Z1389 Encounter for screening for other disorder: Secondary | ICD-10-CM | POA: Diagnosis not present

## 2020-03-22 DIAGNOSIS — M329 Systemic lupus erythematosus, unspecified: Secondary | ICD-10-CM | POA: Diagnosis not present

## 2020-03-22 DIAGNOSIS — R519 Headache, unspecified: Secondary | ICD-10-CM | POA: Diagnosis not present

## 2020-03-22 DIAGNOSIS — M5416 Radiculopathy, lumbar region: Secondary | ICD-10-CM | POA: Diagnosis not present

## 2020-03-22 DIAGNOSIS — G35 Multiple sclerosis: Secondary | ICD-10-CM | POA: Diagnosis not present

## 2020-03-22 DIAGNOSIS — R4702 Dysphasia: Secondary | ICD-10-CM | POA: Diagnosis not present

## 2020-04-19 DIAGNOSIS — Z1389 Encounter for screening for other disorder: Secondary | ICD-10-CM | POA: Diagnosis not present

## 2020-04-19 DIAGNOSIS — G894 Chronic pain syndrome: Secondary | ICD-10-CM | POA: Diagnosis not present

## 2020-04-19 DIAGNOSIS — M5416 Radiculopathy, lumbar region: Secondary | ICD-10-CM | POA: Diagnosis not present

## 2020-04-19 DIAGNOSIS — G35 Multiple sclerosis: Secondary | ICD-10-CM | POA: Diagnosis not present

## 2020-04-19 DIAGNOSIS — R519 Headache, unspecified: Secondary | ICD-10-CM | POA: Diagnosis not present

## 2020-04-19 DIAGNOSIS — Z79891 Long term (current) use of opiate analgesic: Secondary | ICD-10-CM | POA: Diagnosis not present

## 2020-04-19 DIAGNOSIS — R4702 Dysphasia: Secondary | ICD-10-CM | POA: Diagnosis not present

## 2020-04-19 DIAGNOSIS — M5136 Other intervertebral disc degeneration, lumbar region: Secondary | ICD-10-CM | POA: Diagnosis not present

## 2020-05-08 DIAGNOSIS — I10 Essential (primary) hypertension: Secondary | ICD-10-CM | POA: Diagnosis not present

## 2020-05-08 DIAGNOSIS — G43909 Migraine, unspecified, not intractable, without status migrainosus: Secondary | ICD-10-CM | POA: Diagnosis not present

## 2020-05-08 DIAGNOSIS — M069 Rheumatoid arthritis, unspecified: Secondary | ICD-10-CM | POA: Diagnosis not present

## 2020-05-08 DIAGNOSIS — E1169 Type 2 diabetes mellitus with other specified complication: Secondary | ICD-10-CM | POA: Diagnosis not present

## 2020-05-08 DIAGNOSIS — F32A Depression, unspecified: Secondary | ICD-10-CM | POA: Diagnosis not present

## 2020-05-08 DIAGNOSIS — D509 Iron deficiency anemia, unspecified: Secondary | ICD-10-CM | POA: Diagnosis not present

## 2020-05-08 DIAGNOSIS — K219 Gastro-esophageal reflux disease without esophagitis: Secondary | ICD-10-CM | POA: Diagnosis not present

## 2020-05-17 ENCOUNTER — Other Ambulatory Visit: Payer: Medicare Other

## 2020-05-17 ENCOUNTER — Ambulatory Visit: Payer: Medicare Other | Admitting: Hematology and Oncology

## 2020-05-17 DIAGNOSIS — M5136 Other intervertebral disc degeneration, lumbar region: Secondary | ICD-10-CM | POA: Diagnosis not present

## 2020-05-17 DIAGNOSIS — G35 Multiple sclerosis: Secondary | ICD-10-CM | POA: Diagnosis not present

## 2020-05-17 DIAGNOSIS — R4702 Dysphasia: Secondary | ICD-10-CM | POA: Diagnosis not present

## 2020-05-17 DIAGNOSIS — M5416 Radiculopathy, lumbar region: Secondary | ICD-10-CM | POA: Diagnosis not present

## 2020-05-17 DIAGNOSIS — G894 Chronic pain syndrome: Secondary | ICD-10-CM | POA: Diagnosis not present

## 2020-05-17 DIAGNOSIS — R519 Headache, unspecified: Secondary | ICD-10-CM | POA: Diagnosis not present

## 2020-05-17 DIAGNOSIS — Z1389 Encounter for screening for other disorder: Secondary | ICD-10-CM | POA: Diagnosis not present

## 2020-05-17 DIAGNOSIS — M329 Systemic lupus erythematosus, unspecified: Secondary | ICD-10-CM | POA: Diagnosis not present

## 2020-05-20 ENCOUNTER — Encounter: Payer: Self-pay | Admitting: Hematology and Oncology

## 2020-05-20 ENCOUNTER — Inpatient Hospital Stay (HOSPITAL_BASED_OUTPATIENT_CLINIC_OR_DEPARTMENT_OTHER): Payer: Medicare Other | Admitting: Hematology and Oncology

## 2020-05-20 ENCOUNTER — Telehealth: Payer: Self-pay | Admitting: Oncology

## 2020-05-20 ENCOUNTER — Other Ambulatory Visit: Payer: Self-pay | Admitting: Hematology and Oncology

## 2020-05-20 ENCOUNTER — Other Ambulatory Visit: Payer: Self-pay

## 2020-05-20 ENCOUNTER — Inpatient Hospital Stay: Payer: Medicare Other | Attending: Hematology and Oncology

## 2020-05-20 VITALS — BP 134/73 | HR 95 | Temp 98.9°F | Wt 227.3 lb

## 2020-05-20 DIAGNOSIS — E559 Vitamin D deficiency, unspecified: Secondary | ICD-10-CM | POA: Diagnosis not present

## 2020-05-20 DIAGNOSIS — M069 Rheumatoid arthritis, unspecified: Secondary | ICD-10-CM | POA: Diagnosis not present

## 2020-05-20 DIAGNOSIS — D500 Iron deficiency anemia secondary to blood loss (chronic): Secondary | ICD-10-CM

## 2020-05-20 DIAGNOSIS — R109 Unspecified abdominal pain: Secondary | ICD-10-CM

## 2020-05-20 DIAGNOSIS — Z8673 Personal history of transient ischemic attack (TIA), and cerebral infarction without residual deficits: Secondary | ICD-10-CM | POA: Insufficient documentation

## 2020-05-20 DIAGNOSIS — D509 Iron deficiency anemia, unspecified: Secondary | ICD-10-CM | POA: Insufficient documentation

## 2020-05-20 DIAGNOSIS — D649 Anemia, unspecified: Secondary | ICD-10-CM | POA: Diagnosis not present

## 2020-05-20 DIAGNOSIS — R300 Dysuria: Secondary | ICD-10-CM | POA: Diagnosis not present

## 2020-05-20 LAB — IRON AND TIBC
Iron: 37 ug/dL (ref 28–170)
Saturation Ratios: 13 % (ref 10.4–31.8)
TIBC: 292 ug/dL (ref 250–450)
UIBC: 255 ug/dL

## 2020-05-20 LAB — CBC
MCV: 78 — AB (ref 81–99)
RBC: 5.33 — AB (ref 3.87–5.11)

## 2020-05-20 LAB — FERRITIN: Ferritin: 53 ng/mL (ref 11–307)

## 2020-05-20 LAB — CBC AND DIFFERENTIAL
HCT: 42 (ref 36–46)
Hemoglobin: 12.8 (ref 12.0–16.0)
Neutrophils Absolute: 6.27
Platelets: 383 (ref 150–399)
WBC: 9.5

## 2020-05-20 LAB — VITAMIN D 25 HYDROXY (VIT D DEFICIENCY, FRACTURES): Vit D, 25-Hydroxy: 18.95 ng/mL — ABNORMAL LOW (ref 30–100)

## 2020-05-20 LAB — VITAMIN B12: Vitamin B-12: 241 pg/mL (ref 180–914)

## 2020-05-20 NOTE — Telephone Encounter (Signed)
Per 3/21 LOS, patient scheduled for [facility] X-Ray - June Appt's.  Gave patient Appt Summary/Orders

## 2020-05-20 NOTE — Progress Notes (Signed)
[facility]  [address] [place],  [place]  [address] [phone]  Clinic Day:  05/21/2020  Referring physician: Jeanie Sewer, NP   CHIEF COMPLAINT:  CC:   Fatigue with history of iron deficiency anemia  Current Treatment:   Observation    HISTORY OF PRESENT ILLNESS:  Andrea Wade is a 50 y.o. female with a iron deficiency anemia who we began seeing in March 2021 when her anemia did not respond to oral iron supplementation twice daily for 6 months.  Her iron deficiency was felt to be due to heavy menses. The patient reported a history of anemia for the past 20 years. She had a colonoscopy in March 2016 with Dr. Lyndel Safe, due to rectal bleeding, anemia and diarrhea.  This revealed internal hemorrhoids, felt to be the cause of the rectal bleeding, with very minimal sigmoid diverticulosis, otherwise normal. Further evaluation of the anemia  did not reveal any evidence of hemolysis, monoclonal gammopathy or other nutritional deficiency contributing to her anemia.  Her B12 was low normal at 278, but a methylmalonic acid could not be added at that time.  Hemoglobin electrophoresis was normal.  Rheumatoid factor and ANA were positive.  The patient was instructed to bring back stool Hemoccult cards, but did not do so.  Oral iron was discontinued due to poor absorption.  She was given IV iron in the form of Feraheme in April.  She also has a history of stroke x2 with associated dysphagia and memory difficulties, seizures, rheumatoid arthritis, polyneuropathy, diabetes and depression and anxiety.  INTERVAL HISTORY:  Andrea Wade is here today for repeat clinical assessment. She states she has been very fatigued.  She states she has not been having regular menses for almost a year, but did have a light menses out of the blue in the past couple of weeks.  She otherwise denies any overt form of blood loss.  She denies melena or hematochezia.  She reports left flank  pain and dark urine for about a week, but has not seen her primary care provider.  She does not have a history of kidney stones. She denies fevers or chills. Her appetite is good. Her weight has been stable.  REVIEW OF SYSTEMS:  Review of Systems  Constitutional: Positive for fatigue. Negative for appetite change, chills, fever and unexpected weight change.  HENT:   Negative for lump/mass, mouth sores and sore throat.   Respiratory: Positive for shortness of breath (mild with exertion). Negative for cough.   Cardiovascular: Negative for chest pain and leg swelling.  Gastrointestinal: Positive for diarrhea (intermittent). Negative for abdominal pain, constipation, nausea and vomiting.  Endocrine: Negative for hot flashes.  Genitourinary: Negative for difficulty urinating, dysuria, frequency and hematuria.   Musculoskeletal: Negative for arthralgias, back pain and myalgias.  Skin: Negative for rash.  Neurological: Negative for dizziness and headaches.  Hematological: Negative for adenopathy. Does not bruise/bleed easily.  Psychiatric/Behavioral: Negative for depression and sleep disturbance. The patient is not nervous/anxious.      VITALS:  Blood pressure 134/73, pulse 95, temperature 98.9 F (37.2 C), temperature source Oral, weight 227 lb 4.8 oz (103.1 kg), SpO2 98 %.  Wt Readings from Last 3 Encounters:  05/20/20 227 lb 4.8 oz (103.1 kg)  04/19/16 247 lb 1.6 oz (112.1 kg)  03/31/16 248 lb 11.2 oz (112.8 kg)    Body mass index is 33.57 kg/m.  Performance status (ECOG): 2 - Symptomatic, <50% confined to bed  PHYSICAL EXAM:  Physical  Exam Vitals and nursing note reviewed.  Constitutional:      General: She is not in acute distress.    Appearance: Normal appearance.  HENT:     Head: Normocephalic and atraumatic.     Mouth/Throat:     Mouth: Mucous membranes are moist.     Pharynx: Oropharynx is clear. No oropharyngeal exudate or posterior oropharyngeal erythema.  Eyes:      General: No scleral icterus.    Extraocular Movements: Extraocular movements intact.     Conjunctiva/sclera: Conjunctivae normal.     Pupils: Pupils are equal, round, and reactive to light.  Cardiovascular:     Rate and Rhythm: Normal rate and regular rhythm.     Heart sounds: Normal heart sounds. No murmur heard. No friction rub. No gallop.   Pulmonary:     Effort: Pulmonary effort is normal.     Breath sounds: Normal breath sounds. No rhonchi or rales.  Chest:  Breasts:     Right: No axillary adenopathy or supraclavicular adenopathy.     Left: No axillary adenopathy or supraclavicular adenopathy.    Abdominal:     General: There is no distension.     Palpations: Abdomen is soft. There is splenomegaly ( probable mild splenomegaly which is difficult to palpate). There is no hepatomegaly or mass.     Tenderness: There is no abdominal tenderness. There is left CVA tenderness. There is no right CVA tenderness.  Musculoskeletal:        General: Normal range of motion.     Cervical back: Normal range of motion and neck supple. No tenderness.     Right lower leg: No edema.     Left lower leg: No edema.  Lymphadenopathy:     Cervical: No cervical adenopathy.     Upper Body:     Right upper body: No supraclavicular or axillary adenopathy.     Left upper body: No supraclavicular or axillary adenopathy.     Lower Body: No right inguinal adenopathy. No left inguinal adenopathy.  Skin:    General: Skin is warm and dry.     Coloration: Skin is not jaundiced.     Findings: No rash.  Neurological:     Mental Status: She is alert and oriented to person, place, and time.     Cranial Nerves: No cranial nerve deficit.  Psychiatric:        Mood and Affect: Mood normal.        Behavior: Behavior normal.        Thought Content: Thought content normal.    LABS:   CBC Latest Ref Rng & Units 05/20/2020 04/19/2016 04/18/2016  WBC - 9.5 5.1 5.2  Hemoglobin 12.0 - 16.0 12.8 9.5(L) 8.9(L)   Hematocrit 36 - 46 42 32.2(L) 31.5(L)  Platelets 150 - 399 383 268 287   CMP Latest Ref Rng & Units 04/19/2016 04/18/2016 04/17/2016  Glucose 65 - 99 mg/dL 155(H) 120(H) 107(H)  BUN 6 - 20 mg/dL <5(L) <5(L) <5(L)  Creatinine 0.44 - 1.00 mg/dL 0.48 0.46 0.51  Sodium 135 - 145 mmol/L 138 139 136  Potassium 3.5 - 5.1 mmol/L 3.5 3.6 3.3(L)  Chloride 101 - 111 mmol/L 103 105 103  CO2 22 - 32 mmol/L 28 26 24   Calcium 8.9 - 10.3 mg/dL 8.4(L) 8.2(L) 7.9(L)  Total Protein 6.5 - 8.1 g/dL - - -  Total Bilirubin 0.3 - 1.2 mg/dL - - -  Alkaline Phos 38 - 126 U/L - - -  AST 15 -  41 U/L - - -  ALT 14 - 54 U/L - - -     No results found for: CEA1 / No results found for: CEA1 No results found for: PSA1 No results found for: KKX381 No results found for: CAN125  No results found for: Ronnald Ramp, A1GS, A2GS, BETS, BETA2SER, GAMS, MSPIKE, SPEI Lab Results  Component Value Date   TIBC 292 05/20/2020   TIBC 347 10/05/2013   TIBC 300 09/22/2012   FERRITIN 53 05/20/2020   FERRITIN 9 (L) 10/05/2013   FERRITIN 16 04/17/2013   IRONPCTSAT 13 05/20/2020   IRONPCTSAT 7 (L) 10/05/2013   IRONPCTSAT 8 (L) 09/22/2012   No results found for: LDH  STUDIES:  No results found.    HISTORY:   Past Medical History:  Diagnosis Date  . Anemia   . Anxiety   . Arthritis    "joints" (04/17/2013)  . Chronic back pain    "all over" (04/17/2013)  . Crohn disease ([place])   . Daily headache   . Depression   . Diabetes type 2, uncontrolled ([facility])   . 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 GERD (gastroesophageal reflux disease)   . Migraine    "q day" (04/17/2013)  . MS (multiple sclerosis) ([place])   . Pneumonia    "about 10 times in my lifetime" (04/17/2013)    Past Surgical History:  Procedure Laterality Date  . CARDIAC CATHETERIZATION N/A 10/08/2015   Procedure: Left Heart Cath and Coronary Angiography;  Surgeon: Peter M Martinique, MD;  Location: [facility];   Service: Cardiovascular;  Laterality: N/A;  . CHOLECYSTECTOMY  1994  . TUBAL LIGATION  1993    Family History  Problem Relation Age of Onset  . Diabetes Mother   . Heart disease Mother   . Stroke Mother   . Heart failure Mother   . Heart disease Father   . Stroke Father   . Heart failure Father   . Heart attack Brother   . Healthy Brother     Social History:  reports that she has quit smoking. Her smoking use included cigarettes. She has a 10.50 pack-year smoking history. She has never used smokeless tobacco. She reports that she does not drink alcohol and does not use drugs.The patient is accompanied by her husband today.  Allergies:  Allergies  Allergen Reactions  . Codeine Hives, Itching and Palpitations  . Peanut-Containing Drug Products Anaphylaxis  . Tape Other (See Comments)    Slight irritation  . Aspirin Itching    High doses only-baby is ok  . Penicillins Hives, Itching and Nausea And Vomiting    Allergy from childhood Has patient had a PCN reaction causing immediate rash, facial/tongue/throat swelling, SOB or lightheadedness with hypotension: Yes Has patient had a PCN reaction causing severe rash involving mucus membranes or skin necrosis: Unk Has patient had a PCN reaction that required hospitalization: Unk Has patient had a PCN reaction occurring within the last 10 years: No If all of the above answers are "NO", then may proceed with Cephalosporin use.      Current Medications: Current Outpatient Medications  Medication Sig Dispense Refill  . baclofen (LIORESAL) 10 MG tablet Take 20 mg by mouth every 6 (six) hours.    . benzonatate (TESSALON) 100 MG capsule Take 1 capsule (100 mg total) by mouth 3 (three) times daily. Cough 30 capsule 0  . Canagliflozin-Metformin HCl (INVOKAMET) [phone] MG TABS Take 1 tablet by  mouth 2 (two) times daily.    . DULoxetine (CYMBALTA) 60 MG capsule Take 60 mg by mouth daily.    . Eluxadoline (VIBERZI) 75 MG TABS Take 75 mg by  mouth 2 (two) times daily.    . Eslicarbazepine Acetate (APTIOM) 800 MG TABS Take 1,600 mg by mouth at bedtime.    . Gabapentin Enacarbil (HORIZANT) 600 MG TBCR Take 600 mg by mouth 2 (two) times daily.     Marland Kitchen guaiFENesin (MUCINEX) 600 MG 12 hr tablet Take 1 tablet (600 mg total) by mouth 2 (two) times daily. 30 tablet 3  . guaiFENesin-dextromethorphan (ROBITUSSIN DM) 100-10 MG/5ML syrup Take 5 mLs by mouth every 4 (four) hours as needed for cough. 118 mL 0  . HYDROmorphone (DILAUDID) 8 MG tablet Take 8 mg by mouth every 8 (eight) hours as needed (breakthrough pain).    Marland Kitchen HYDROmorphone HCl (EXALGO) 12 MG T24A SR tablet Take 12 mg by mouth daily.    . insulin aspart (NOVOLOG FLEXPEN) 100 UNIT/ML FlexPen Inject 16-24 Units into the skin 3 (three) times daily with meals. SLIDING SCALE    . insulin detemir (LEVEMIR) 100 unit/ml SOLN Inject 40 Units into the skin at bedtime.    Marland Kitchen levofloxacin (LEVAQUIN) 750 MG tablet Take 1 tablet (750 mg total) by mouth daily. X 3 days 3 tablet 0  . lisinopril (PRINIVIL,ZESTRIL) 5 MG tablet Take 1 tablet (5 mg total) by mouth daily. 30 tablet 3  . nortriptyline (PAMELOR) 25 MG capsule Take 50 mg by mouth at bedtime.     . pantoprazole (PROTONIX) 40 MG tablet Take 1 tablet (40 mg total) by mouth daily. 30 tablet 2  . promethazine (PHENERGAN) 12.5 MG tablet Take 12.5 mg by mouth 2 (two) times daily.     No current facility-administered medications for this visit.    ASSESSMENT & PLAN:   Assessment:  1. Microcytic hypochromic anemia, secondary to iron deficiency.  Iron studies did not reveal recurrent iron deficiency. 2. 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  3. Severe rheumatoid arthritis. We have recommended she see a rheumatologist.   4. 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 5. History of strokes. 6. Vitamin-D insufficiency. She has not been taking vitamin-D supplementation, so advised her once again vitamin  D3 2000 international units daily. 7. Low normal B12. B12 remains low normal, so advised her to start vitamin B12 1000 mcg daily. 8. Left flank tenderness with dark urine.  Urinalysis reveals 4+ glucose, otherwise normal.  Urine culture  Was negative.. 9. Fatigue, with a normal hemoglobin, likely due to other medical comorbidities.  Plan:   As above, we recommend vitamin-D and B12 supplementation.  I recommend she follow-up with her primary care provider regarding the 4+ glucose in the urine, as well as her flank pain.  We will forward today's results to Jeanie Sewer, NP We will otherwise plan to see her back in 3 months for continued observation.  The patient and her husband understand the plans discussed today and are in agreement with them.  They know to contact us if she develops concerns prior to her next appointment.      [name], PA-C

## 2020-05-21 ENCOUNTER — Telehealth: Payer: Self-pay | Admitting: Hematology and Oncology

## 2020-05-21 NOTE — Telephone Encounter (Signed)
Telephoned patient to give her the results of her labs and x-ray from  March 21st. The urinalysis was not suspicious for infection, but did show 4+ glucose. The KUB did not reveal any evidence of kidney stones but probable constipation. She states she is not constipated.  She states she continues to have left flank pain.  I instructed her to follow up with her primary care provider soon as possible regarding the diabetes and flank pain.  She states that she needs to schedule to see her endocrinologist regarding her diabetes.  Her vitamin-D level remained low, so I instructed her to take vitamin D3 2000 international units daily.  Her B12 level remained low normal, so I instructed her to take a 1000 mcg of B12 daily.  Iron studies did not reveal at recurrent iron deficiency despite persistent microcytosis, so she does not need IV iron supplement at this time. The patient verbalized understanding.  We will forward her labs and x-ray to Jeanie Sewer, NP

## 2020-06-10 DIAGNOSIS — M5416 Radiculopathy, lumbar region: Secondary | ICD-10-CM | POA: Diagnosis not present

## 2020-06-10 DIAGNOSIS — M5136 Other intervertebral disc degeneration, lumbar region: Secondary | ICD-10-CM | POA: Diagnosis not present

## 2020-06-10 DIAGNOSIS — G35 Multiple sclerosis: Secondary | ICD-10-CM | POA: Diagnosis not present

## 2020-06-10 DIAGNOSIS — Z1389 Encounter for screening for other disorder: Secondary | ICD-10-CM | POA: Diagnosis not present

## 2020-06-10 DIAGNOSIS — G894 Chronic pain syndrome: Secondary | ICD-10-CM | POA: Diagnosis not present

## 2020-06-10 DIAGNOSIS — R4702 Dysphasia: Secondary | ICD-10-CM | POA: Diagnosis not present

## 2020-06-10 DIAGNOSIS — R519 Headache, unspecified: Secondary | ICD-10-CM | POA: Diagnosis not present

## 2020-07-05 DIAGNOSIS — E785 Hyperlipidemia, unspecified: Secondary | ICD-10-CM | POA: Diagnosis not present

## 2020-07-05 DIAGNOSIS — Z0000 Encounter for general adult medical examination without abnormal findings: Secondary | ICD-10-CM | POA: Diagnosis not present

## 2020-07-05 DIAGNOSIS — Z9181 History of falling: Secondary | ICD-10-CM | POA: Diagnosis not present

## 2020-07-08 DIAGNOSIS — Z1389 Encounter for screening for other disorder: Secondary | ICD-10-CM | POA: Diagnosis not present

## 2020-07-08 DIAGNOSIS — R4702 Dysphasia: Secondary | ICD-10-CM | POA: Diagnosis not present

## 2020-07-08 DIAGNOSIS — M5136 Other intervertebral disc degeneration, lumbar region: Secondary | ICD-10-CM | POA: Diagnosis not present

## 2020-07-08 DIAGNOSIS — G35 Multiple sclerosis: Secondary | ICD-10-CM | POA: Diagnosis not present

## 2020-07-08 DIAGNOSIS — R519 Headache, unspecified: Secondary | ICD-10-CM | POA: Diagnosis not present

## 2020-07-08 DIAGNOSIS — M5416 Radiculopathy, lumbar region: Secondary | ICD-10-CM | POA: Diagnosis not present

## 2020-07-08 DIAGNOSIS — G894 Chronic pain syndrome: Secondary | ICD-10-CM | POA: Diagnosis not present

## 2020-08-08 DIAGNOSIS — G35 Multiple sclerosis: Secondary | ICD-10-CM | POA: Diagnosis not present

## 2020-08-08 DIAGNOSIS — R4702 Dysphasia: Secondary | ICD-10-CM | POA: Diagnosis not present

## 2020-08-08 DIAGNOSIS — M5136 Other intervertebral disc degeneration, lumbar region: Secondary | ICD-10-CM | POA: Diagnosis not present

## 2020-08-08 DIAGNOSIS — G894 Chronic pain syndrome: Secondary | ICD-10-CM | POA: Diagnosis not present

## 2020-08-08 DIAGNOSIS — Z1389 Encounter for screening for other disorder: Secondary | ICD-10-CM | POA: Diagnosis not present

## 2020-08-08 DIAGNOSIS — R519 Headache, unspecified: Secondary | ICD-10-CM | POA: Diagnosis not present

## 2020-08-08 DIAGNOSIS — M5416 Radiculopathy, lumbar region: Secondary | ICD-10-CM | POA: Diagnosis not present

## 2020-08-13 DIAGNOSIS — Z794 Long term (current) use of insulin: Secondary | ICD-10-CM | POA: Diagnosis not present

## 2020-08-13 DIAGNOSIS — E1165 Type 2 diabetes mellitus with hyperglycemia: Secondary | ICD-10-CM | POA: Diagnosis not present

## 2020-08-19 NOTE — Progress Notes (Signed)
[facility]  [address] [place],  [place]  [address] [phone]  Clinic Day:  08/31/2020  Referring physician: Jeanie Sewer, NP   CHIEF COMPLAINT:  CC:   A 50 year old female with history of  iron deficiency anemia  Current Treatment:   Observation    HISTORY OF PRESENT ILLNESS:  Andrea Wade is a 50 y.o. female with a iron deficiency anemia who we began seeing in March 2021 when her anemia did not respond to oral iron supplementation twice daily for 6 months.  Her iron deficiency was felt to be due to heavy menses. The patient reported a history of anemia for the past 20 years. She had a colonoscopy in March 2016 with Dr. Lyndel Safe, due to rectal bleeding, anemia and diarrhea.  This revealed internal hemorrhoids, felt to be the cause of the rectal bleeding, with very minimal sigmoid diverticulosis, otherwise normal. Further evaluation of the anemia  did not reveal any evidence of hemolysis, monoclonal gammopathy or other nutritional deficiency contributing to her anemia.  Her B12 was low normal at 278, but a methylmalonic acid could not be added at that time.  Hemoglobin electrophoresis was normal.  Rheumatoid factor and ANA were positive.  The patient was instructed to bring back stool Hemoccult cards, but did not do so.  Oral iron was discontinued due to poor absorption.  She was given IV iron in the form of Feraheme in April.  She also has a history of stroke x2 with associated dysphagia and memory difficulties, seizures, rheumatoid arthritis, polyneuropathy, diabetes and depression and anxiety.  INTERVAL HISTORY:  Andrea Wade is here today for evaluation. She has been well since her last visit. Her main complaints today are related to pain most likely related to rheumatoid arthritis. She has an appointment with rheumatology later this afternoon.   REVIEW OF SYSTEMS:  Review of Systems  Constitutional:  Positive for fatigue. Negative for appetite  change, chills, fever and unexpected weight change.  HENT:   Negative for lump/mass, mouth sores and sore throat.   Respiratory:  Negative for cough and shortness of breath (mild with exertion).   Cardiovascular:  Negative for chest pain and leg swelling.  Gastrointestinal:  Negative for abdominal pain, constipation, diarrhea (intermittent), nausea and vomiting.  Endocrine: Negative for hot flashes.  Genitourinary:  Negative for difficulty urinating, dysuria, frequency and hematuria.   Musculoskeletal:  Negative for arthralgias, back pain and myalgias.  Skin:  Negative for rash.  Neurological:  Negative for dizziness and headaches.  Hematological:  Negative for adenopathy. Does not bruise/bleed easily.  Psychiatric/Behavioral:  Negative for depression and sleep disturbance. The patient is not nervous/anxious.     VITALS:  Blood pressure 103/65, pulse 97, temperature 98.3 F (36.8 C), temperature source Oral, resp. rate 18, height 5' 9"  (1.753 m), weight 221 lb 11.2 oz (100.6 kg), SpO2 96 %.  Wt Readings from Last 3 Encounters:  08/20/20 221 lb 11.2 oz (100.6 kg)  05/20/20 227 lb 4.8 oz (103.1 kg)  04/19/16 247 lb 1.6 oz (112.1 kg)    Body mass index is 32.74 kg/m.  Performance status (ECOG): 2 - Symptomatic, <50% confined to bed  PHYSICAL EXAM:  Physical Exam Constitutional:      General: She is not in acute distress.    Appearance: Normal appearance. She is normal weight. She is not ill-appearing, toxic-appearing or diaphoretic.  HENT:     Head: Normocephalic and atraumatic.     Right Ear: Tympanic membrane, ear  canal and external ear normal. There is no impacted cerumen.     Left Ear: Tympanic membrane, ear canal and external ear normal. There is no impacted cerumen.     Nose: Nose normal. No congestion or rhinorrhea.     Mouth/Throat:     Mouth: Mucous membranes are dry.     Pharynx: No oropharyngeal exudate or posterior oropharyngeal erythema.  Eyes:     General: No  scleral icterus.       Right eye: No discharge.        Left eye: No discharge.     Extraocular Movements: Extraocular movements intact.     Conjunctiva/sclera: Conjunctivae normal.     Pupils: Pupils are equal, round, and reactive to light.  Neck:     Vascular: No carotid bruit.  Cardiovascular:     Rate and Rhythm: Normal rate. Rhythm irregular.     Pulses: Normal pulses.     Heart sounds: No murmur heard.   No friction rub. No gallop.  Pulmonary:     Effort: Pulmonary effort is normal. No respiratory distress.     Breath sounds: Normal breath sounds. No stridor. No wheezing, rhonchi or rales.  Chest:     Chest wall: No tenderness.  Abdominal:     General: Abdomen is flat. Bowel sounds are normal. There is no distension.     Palpations: Abdomen is soft. There is no mass.     Tenderness: There is no abdominal tenderness. There is no right CVA tenderness, left CVA tenderness, guarding or rebound.     Hernia: No hernia is present.  Musculoskeletal:        General: No swelling, tenderness, deformity or signs of injury. Normal range of motion.     Cervical back: Normal range of motion and neck supple. No rigidity or tenderness.     Right lower leg: No edema.     Left lower leg: No edema.  Lymphadenopathy:     Cervical: No cervical adenopathy.  Skin:    General: Skin is warm and dry.     Capillary Refill: Capillary refill takes less than 2 seconds.     Coloration: Skin is not jaundiced or pale.     Findings: No bruising, erythema, lesion or rash.  Neurological:     General: No focal deficit present.     Mental Status: She is alert and oriented to person, place, and time. Mental status is at baseline.     Cranial Nerves: No cranial nerve deficit.     Sensory: No sensory deficit.     Motor: No weakness.     Coordination: Coordination normal.     Gait: Gait normal.     Deep Tendon Reflexes: Reflexes normal.  Psychiatric:        Mood and Affect: Mood normal.        Behavior:  Behavior normal.        Thought Content: Thought content normal.        Judgment: Judgment normal.   LABS:   CBC Latest Ref Rng & Units 08/20/2020 05/20/2020 04/19/2016  WBC - 12.1 9.5 5.1  Hemoglobin 12.0 - 16.0 12.5 12.8 9.5(L)  Hematocrit 36 - 46 38 42 32.2(L)  Platelets 150 - 399 268 383 268   CMP Latest Ref Rng & Units 04/19/2016 04/18/2016 04/17/2016  Glucose 65 - 99 mg/dL 155(H) 120(H) 107(H)  BUN 6 - 20 mg/dL <5(L) <5(L) <5(L)  Creatinine 0.44 - 1.00 mg/dL 0.48 0.46 0.51  Sodium 135 -  145 mmol/L 138 139 136  Potassium 3.5 - 5.1 mmol/L 3.5 3.6 3.3(L)  Chloride 101 - 111 mmol/L 103 105 103  CO2 22 - 32 mmol/L 28 26 24   Calcium 8.9 - 10.3 mg/dL 8.4(L) 8.2(L) 7.9(L)  Total Protein 6.5 - 8.1 g/dL - - -  Total Bilirubin 0.3 - 1.2 mg/dL - - -  Alkaline Phos 38 - 126 U/L - - -  AST 15 - 41 U/L - - -  ALT 14 - 54 U/L - - -     No results found for: CEA1 / No results found for: CEA1 No results found for: PSA1 No results found for: EYC144 No results found for: CAN125  No results found for: Ronnald Ramp, A1GS, A2GS, Arnaldo Natal, GAMS, MSPIKE, SPEI Lab Results  Component Value Date   TIBC 286 08/20/2020   TIBC 292 05/20/2020   TIBC 347 10/05/2013   FERRITIN 48 08/20/2020   FERRITIN 53 05/20/2020   FERRITIN 9 (L) 10/05/2013   IRONPCTSAT 16 08/20/2020   IRONPCTSAT 13 05/20/2020   IRONPCTSAT 7 (L) 10/05/2013   No results found for: LDH  STUDIES:  No results found.    HISTORY:   Past Medical History:  Diagnosis Date   Anemia    Anxiety    Arthritis    "joints" (04/17/2013)   Chronic back pain    "all over" (04/17/2013)   Crohn disease ([place])    Daily headache    Depression    Diabetes type 2, uncontrolled ([place])    Epileptic ([place])    "had 1-2/night; started RX; last one was 01/2014"   GERD (gastroesophageal reflux disease)    Migraine    "q day" (04/17/2013)   MS (multiple sclerosis) ([place])    Pneumonia    "about 10 times in my lifetime" (04/17/2013)     Past Surgical History:  Procedure Laterality Date   CARDIAC CATHETERIZATION N/A 10/08/2015   Procedure: Left Heart Cath and Coronary Angiography;  Surgeon: Peter M Martinique, MD;  Location: [facility];  Service: Cardiovascular;  Laterality: N/A;   CHOLECYSTECTOMY  1994   TUBAL LIGATION  1993    Family History  Problem Relation Age of Onset   Diabetes Mother    Heart disease Mother    Stroke Mother    Heart failure Mother    Heart disease Father    Stroke Father    Heart failure Father    Heart attack Brother    Healthy Brother     Social History:  reports that she has quit smoking. Her smoking use included cigarettes. She has a 10.50 pack-year smoking history. She has never used smokeless tobacco. She reports that she does not drink alcohol and does not use drugs.The patient is accompanied by her husband today.  Allergies:  Allergies  Allergen Reactions   Codeine Hives, Itching and Palpitations   Peanut-Containing Drug Products Anaphylaxis and Itching   Aspirin Itching    High doses only-baby is ok   Pecan Extract Allergy Skin Test Itching   Penicillins Hives, Itching and Nausea And Vomiting    Allergy from childhood Has patient had a PCN reaction causing immediate rash, facial/tongue/throat swelling, SOB or lightheadedness with hypotension: Yes Has patient had a PCN reaction causing severe rash involving mucus membranes or skin necrosis: Unk Has patient had a PCN reaction that required hospitalization: Unk Has patient had a PCN reaction occurring within the last 10 years: No If all of the above answers are "NO", then  may proceed with Cephalosporin use.     Tape Other (See Comments)    Slight irritation Other reaction(s): Other (See Comments) Slight irritation    Current Medications: Current Outpatient Medications  Medication Sig Dispense Refill   EPINEPHrine 0.3 mg/0.3 mL IJ SOAJ injection Inject into the muscle.     meloxicam (MOBIC) 7.5 MG tablet Take 7.5  mg by mouth 2 (two) times daily.     Semaglutide,0.25 or 0.5MG/DOS, (OZEMPIC, 0.25 OR 0.5 MG/DOSE,) 2 MG/1.5ML SOPN Inject into the skin.     baclofen (LIORESAL) 10 MG tablet Take 20 mg by mouth every 6 (six) hours.     buPROPion (WELLBUTRIN XL) 150 MG 24 hr tablet Take 1 tablet by mouth every morning.     DULoxetine (CYMBALTA) 60 MG capsule Take 60 mg by mouth daily.     EMGALITY 120 MG/ML SOSY Inject into the skin.     insulin aspart (NOVOLOG FLEXPEN) 100 UNIT/ML FlexPen Inject 16-24 Units into the skin 3 (three) times daily with meals. SLIDING SCALE     lisinopril (PRINIVIL,ZESTRIL) 5 MG tablet Take 1 tablet (5 mg total) by mouth daily. 30 tablet 3   nortriptyline (PAMELOR) 75 MG capsule Take 75 mg by mouth at bedtime.     Oxycodone HCl 20 MG TABS Take 1 tablet by mouth 4 (four) times daily as needed.     pantoprazole (PROTONIX) 40 MG tablet Take 1 tablet (40 mg total) by mouth daily. 30 tablet 2   promethazine (PHENERGAN) 12.5 MG tablet Take 12.5 mg by mouth 2 (two) times daily.     SYNJARDY XR 11-998 MG TB24 Take by mouth.     TRESIBA FLEXTOUCH 100 UNIT/ML FlexTouch Pen Inject into the skin.     No current facility-administered medications for this visit.    ASSESSMENT & PLAN:   Assessment:  1. Microcytic hypochromic anemia, secondary to iron deficiency.  Iron studies did not reveal recurrent iron deficiency. Iron studies normal today. 2. 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 No splenomegaly noted today.  3. Severe rheumatoid arthritis. We have recommended she see a rheumatologist. She is scheduled with rheumatologist today. 4. 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 5. History of strokes. 6. Fatigue, with a normal hemoglobin, likely due to other medical comorbidities.  Plan:   Iron studies are normal today. We will see her back in clinic in 3 months for repeat evaluation. She will continue to follow with PCP and  Rheumatology.  She verbalizes understanding of and agreement to the plans discussed today. She knows to call the office should any new questions or concerns arise.     Melodye Ped, NP

## 2020-08-20 ENCOUNTER — Inpatient Hospital Stay: Payer: Medicare Other | Attending: Hematology and Oncology

## 2020-08-20 ENCOUNTER — Inpatient Hospital Stay (INDEPENDENT_AMBULATORY_CARE_PROVIDER_SITE_OTHER): Payer: Medicare Other | Admitting: Hematology and Oncology

## 2020-08-20 ENCOUNTER — Other Ambulatory Visit: Payer: Self-pay

## 2020-08-20 ENCOUNTER — Encounter: Payer: Self-pay | Admitting: Hematology and Oncology

## 2020-08-20 ENCOUNTER — Telehealth: Payer: Self-pay | Admitting: Hematology and Oncology

## 2020-08-20 VITALS — BP 103/65 | HR 97 | Temp 98.3°F | Resp 18 | Ht 69.0 in | Wt 221.7 lb

## 2020-08-20 DIAGNOSIS — D509 Iron deficiency anemia, unspecified: Secondary | ICD-10-CM | POA: Diagnosis not present

## 2020-08-20 DIAGNOSIS — D500 Iron deficiency anemia secondary to blood loss (chronic): Secondary | ICD-10-CM

## 2020-08-20 DIAGNOSIS — E119 Type 2 diabetes mellitus without complications: Secondary | ICD-10-CM | POA: Insufficient documentation

## 2020-08-20 DIAGNOSIS — Z87891 Personal history of nicotine dependence: Secondary | ICD-10-CM | POA: Insufficient documentation

## 2020-08-20 DIAGNOSIS — M069 Rheumatoid arthritis, unspecified: Secondary | ICD-10-CM | POA: Insufficient documentation

## 2020-08-20 DIAGNOSIS — Z79899 Other long term (current) drug therapy: Secondary | ICD-10-CM | POA: Insufficient documentation

## 2020-08-20 DIAGNOSIS — G35 Multiple sclerosis: Secondary | ICD-10-CM | POA: Insufficient documentation

## 2020-08-20 DIAGNOSIS — D649 Anemia, unspecified: Secondary | ICD-10-CM | POA: Diagnosis not present

## 2020-08-20 DIAGNOSIS — G40909 Epilepsy, unspecified, not intractable, without status epilepticus: Secondary | ICD-10-CM | POA: Diagnosis not present

## 2020-08-20 DIAGNOSIS — Z794 Long term (current) use of insulin: Secondary | ICD-10-CM | POA: Insufficient documentation

## 2020-08-20 LAB — IRON AND TIBC
Iron: 45 ug/dL (ref 28–170)
Saturation Ratios: 16 % (ref 10.4–31.8)
TIBC: 286 ug/dL (ref 250–450)
UIBC: 241 ug/dL

## 2020-08-20 LAB — FERRITIN: Ferritin: 48 ng/mL (ref 11–307)

## 2020-08-20 LAB — CBC AND DIFFERENTIAL
HCT: 38 (ref 36–46)
Hemoglobin: 12.5 (ref 12.0–16.0)
Platelets: 268 (ref 150–399)
WBC: 12.1

## 2020-08-20 LAB — CBC: RBC: 4.82 (ref 3.87–5.11)

## 2020-08-20 NOTE — Telephone Encounter (Signed)
Per 6/21 los next appt scheduled and given to patient

## 2020-08-28 DIAGNOSIS — R569 Unspecified convulsions: Secondary | ICD-10-CM | POA: Diagnosis not present

## 2020-08-28 DIAGNOSIS — M1990 Unspecified osteoarthritis, unspecified site: Secondary | ICD-10-CM | POA: Diagnosis not present

## 2020-08-28 DIAGNOSIS — I9589 Other hypotension: Secondary | ICD-10-CM | POA: Diagnosis not present

## 2020-08-28 DIAGNOSIS — G8929 Other chronic pain: Secondary | ICD-10-CM | POA: Diagnosis not present

## 2020-08-28 DIAGNOSIS — R519 Headache, unspecified: Secondary | ICD-10-CM | POA: Diagnosis not present

## 2020-09-01 ENCOUNTER — Emergency Department (HOSPITAL_COMMUNITY): Payer: Medicare Other

## 2020-09-01 ENCOUNTER — Other Ambulatory Visit: Payer: Self-pay

## 2020-09-01 ENCOUNTER — Inpatient Hospital Stay (HOSPITAL_COMMUNITY)
Admission: EM | Admit: 2020-09-01 | Discharge: 2020-09-04 | DRG: 315 | Disposition: A | Discharge status: Home / Self Care | Payer: Medicare Other | Attending: Internal Medicine | Admitting: Internal Medicine

## 2020-09-01 ENCOUNTER — Encounter (HOSPITAL_COMMUNITY): Payer: Self-pay | Admitting: Emergency Medicine

## 2020-09-01 DIAGNOSIS — Z794 Long term (current) use of insulin: Secondary | ICD-10-CM

## 2020-09-01 DIAGNOSIS — Z91048 Other nonmedicinal substance allergy status: Secondary | ICD-10-CM | POA: Diagnosis not present

## 2020-09-01 DIAGNOSIS — R6510 Systemic inflammatory response syndrome (SIRS) of non-infectious origin without acute organ dysfunction: Secondary | ICD-10-CM | POA: Diagnosis not present

## 2020-09-01 DIAGNOSIS — Z880 Allergy status to penicillin: Secondary | ICD-10-CM

## 2020-09-01 DIAGNOSIS — J020 Streptococcal pharyngitis: Secondary | ICD-10-CM

## 2020-09-01 DIAGNOSIS — R1110 Vomiting, unspecified: Secondary | ICD-10-CM | POA: Diagnosis not present

## 2020-09-01 DIAGNOSIS — Z833 Family history of diabetes mellitus: Secondary | ICD-10-CM | POA: Diagnosis not present

## 2020-09-01 DIAGNOSIS — I959 Hypotension, unspecified: Principal | ICD-10-CM | POA: Diagnosis present

## 2020-09-01 DIAGNOSIS — G35 Multiple sclerosis: Secondary | ICD-10-CM

## 2020-09-01 DIAGNOSIS — M069 Rheumatoid arthritis, unspecified: Secondary | ICD-10-CM

## 2020-09-01 DIAGNOSIS — G894 Chronic pain syndrome: Secondary | ICD-10-CM | POA: Diagnosis not present

## 2020-09-01 DIAGNOSIS — K219 Gastro-esophageal reflux disease without esophagitis: Secondary | ICD-10-CM | POA: Diagnosis present

## 2020-09-01 DIAGNOSIS — E1165 Type 2 diabetes mellitus with hyperglycemia: Secondary | ICD-10-CM

## 2020-09-01 DIAGNOSIS — R519 Headache, unspecified: Secondary | ICD-10-CM | POA: Diagnosis not present

## 2020-09-01 DIAGNOSIS — R197 Diarrhea, unspecified: Secondary | ICD-10-CM

## 2020-09-01 DIAGNOSIS — R9431 Abnormal electrocardiogram [ECG] [EKG]: Secondary | ICD-10-CM | POA: Diagnosis not present

## 2020-09-01 DIAGNOSIS — D72829 Elevated white blood cell count, unspecified: Secondary | ICD-10-CM | POA: Diagnosis not present

## 2020-09-01 DIAGNOSIS — G8929 Other chronic pain: Secondary | ICD-10-CM | POA: Diagnosis present

## 2020-09-01 DIAGNOSIS — Z885 Allergy status to narcotic agent status: Secondary | ICD-10-CM

## 2020-09-01 DIAGNOSIS — J984 Other disorders of lung: Secondary | ICD-10-CM | POA: Diagnosis not present

## 2020-09-01 DIAGNOSIS — Z91010 Allergy to peanuts: Secondary | ICD-10-CM

## 2020-09-01 DIAGNOSIS — E871 Hypo-osmolality and hyponatremia: Secondary | ICD-10-CM | POA: Diagnosis not present

## 2020-09-01 DIAGNOSIS — R112 Nausea with vomiting, unspecified: Secondary | ICD-10-CM

## 2020-09-01 DIAGNOSIS — R569 Unspecified convulsions: Secondary | ICD-10-CM

## 2020-09-01 DIAGNOSIS — E119 Type 2 diabetes mellitus without complications: Secondary | ICD-10-CM

## 2020-09-01 DIAGNOSIS — R42 Dizziness and giddiness: Secondary | ICD-10-CM

## 2020-09-01 DIAGNOSIS — Z823 Family history of stroke: Secondary | ICD-10-CM

## 2020-09-01 DIAGNOSIS — F319 Bipolar disorder, unspecified: Secondary | ICD-10-CM | POA: Diagnosis present

## 2020-09-01 DIAGNOSIS — Z791 Long term (current) use of non-steroidal anti-inflammatories (NSAID): Secondary | ICD-10-CM

## 2020-09-01 DIAGNOSIS — Z8673 Personal history of transient ischemic attack (TIA), and cerebral infarction without residual deficits: Secondary | ICD-10-CM | POA: Diagnosis not present

## 2020-09-01 DIAGNOSIS — Z8249 Family history of ischemic heart disease and other diseases of the circulatory system: Secondary | ICD-10-CM

## 2020-09-01 DIAGNOSIS — R109 Unspecified abdominal pain: Secondary | ICD-10-CM | POA: Diagnosis not present

## 2020-09-01 DIAGNOSIS — G43909 Migraine, unspecified, not intractable, without status migrainosus: Secondary | ICD-10-CM | POA: Diagnosis not present

## 2020-09-01 DIAGNOSIS — Z87891 Personal history of nicotine dependence: Secondary | ICD-10-CM

## 2020-09-01 DIAGNOSIS — R55 Syncope and collapse: Secondary | ICD-10-CM | POA: Diagnosis not present

## 2020-09-01 DIAGNOSIS — B9629 Other Escherichia coli [E. coli] as the cause of diseases classified elsewhere: Secondary | ICD-10-CM | POA: Diagnosis present

## 2020-09-01 DIAGNOSIS — H81391 Other peripheral vertigo, right ear: Secondary | ICD-10-CM | POA: Diagnosis not present

## 2020-09-01 DIAGNOSIS — K5090 Crohn's disease, unspecified, without complications: Secondary | ICD-10-CM | POA: Diagnosis not present

## 2020-09-01 DIAGNOSIS — Z20822 Contact with and (suspected) exposure to covid-19: Secondary | ICD-10-CM | POA: Diagnosis present

## 2020-09-01 DIAGNOSIS — B950 Streptococcus, group A, as the cause of diseases classified elsewhere: Secondary | ICD-10-CM

## 2020-09-01 DIAGNOSIS — R1084 Generalized abdominal pain: Secondary | ICD-10-CM | POA: Diagnosis not present

## 2020-09-01 DIAGNOSIS — Z79899 Other long term (current) drug therapy: Secondary | ICD-10-CM

## 2020-09-01 DIAGNOSIS — Z7982 Long term (current) use of aspirin: Secondary | ICD-10-CM

## 2020-09-01 DIAGNOSIS — G40909 Epilepsy, unspecified, not intractable, without status epilepticus: Secondary | ICD-10-CM | POA: Diagnosis present

## 2020-09-01 LAB — CBC
HCT: 40.5 % (ref 36.0–46.0)
Hemoglobin: 12.1 g/dL (ref 12.0–15.0)
MCH: 25.5 pg — ABNORMAL LOW (ref 26.0–34.0)
MCHC: 29.9 g/dL — ABNORMAL LOW (ref 30.0–36.0)
MCV: 85.3 fL (ref 80.0–100.0)
Platelets: 319 10*3/uL (ref 150–400)
RBC: 4.75 MIL/uL (ref 3.87–5.11)
RDW: 16.4 % — ABNORMAL HIGH (ref 11.5–15.5)
WBC: 18.7 10*3/uL — ABNORMAL HIGH (ref 4.0–10.5)
nRBC: 0 % (ref 0.0–0.2)

## 2020-09-01 LAB — COMPREHENSIVE METABOLIC PANEL
ALT: 13 U/L (ref 0–44)
AST: 15 U/L (ref 15–41)
Albumin: 3 g/dL — ABNORMAL LOW (ref 3.5–5.0)
Alkaline Phosphatase: 86 U/L (ref 38–126)
Anion gap: 9 (ref 5–15)
BUN: 16 mg/dL (ref 6–20)
CO2: 18 mmol/L — ABNORMAL LOW (ref 22–32)
Calcium: 8.8 mg/dL — ABNORMAL LOW (ref 8.9–10.3)
Chloride: 105 mmol/L (ref 98–111)
Creatinine, Ser: 0.99 mg/dL (ref 0.44–1.00)
GFR, Estimated: >60 mL/min (ref >60)
Glucose, Bld: 182 mg/dL — ABNORMAL HIGH (ref 70–99)
Potassium: 4.2 mmol/L (ref 3.5–5.1)
Sodium: 132 mmol/L — ABNORMAL LOW (ref 135–145)
Total Bilirubin: 0.5 mg/dL (ref 0.3–1.2)
Total Protein: 7.8 g/dL (ref 6.5–8.1)

## 2020-09-01 LAB — I-STAT BETA HCG BLOOD, ED (MC, WL, AP ONLY): I-stat hCG, quantitative: <5 m[IU]/mL (ref <5)

## 2020-09-01 LAB — PROTIME-INR
INR: 1.1 (ref 0.8–1.2)
Prothrombin Time: 13.7 seconds (ref 11.4–15.2)

## 2020-09-01 LAB — I-STAT CHEM 8, ED
BUN: 16 mg/dL (ref 6–20)
Calcium, Ion: 1.03 mmol/L — ABNORMAL LOW (ref 1.15–1.40)
Chloride: 108 mmol/L (ref 98–111)
Creatinine, Ser: 0.9 mg/dL (ref 0.44–1.00)
Glucose, Bld: 187 mg/dL — ABNORMAL HIGH (ref 70–99)
HCT: 40 % (ref 36.0–46.0)
Hemoglobin: 13.6 g/dL (ref 12.0–15.0)
Potassium: 4.3 mmol/L (ref 3.5–5.1)
Sodium: 135 mmol/L (ref 135–145)
TCO2: 16 mmol/L — ABNORMAL LOW (ref 22–32)

## 2020-09-01 LAB — RESP PANEL BY RT-PCR (FLU A&B, COVID) ARPGX2
Influenza A by PCR: NEGATIVE
Influenza B by PCR: NEGATIVE
SARS Coronavirus 2 by RT PCR: NEGATIVE

## 2020-09-01 LAB — GROUP A STREP BY PCR: Group A Strep by PCR: DETECTED — AB

## 2020-09-01 LAB — APTT: aPTT: 29 seconds (ref 24–36)

## 2020-09-01 LAB — LACTIC ACID, PLASMA: Lactic Acid, Venous: 2.2 mmol/L (ref 0.5–1.9)

## 2020-09-01 LAB — CBG MONITORING, ED
Glucose-Capillary: 113 mg/dL — ABNORMAL HIGH (ref 70–99)
Glucose-Capillary: 134 mg/dL — ABNORMAL HIGH (ref 70–99)

## 2020-09-01 LAB — LIPASE, BLOOD: Lipase: 27 U/L (ref 11–51)

## 2020-09-01 MED ORDER — PANTOPRAZOLE SODIUM 40 MG PO TBEC
40.0000 mg | DELAYED_RELEASE_TABLET | Freq: Every day | ORAL | Status: DC
  Administered 2020-09-02: 40 mg via ORAL
  Filled 2020-09-01: qty 1

## 2020-09-01 MED ORDER — METRONIDAZOLE 500 MG/100ML IV SOLN
500.0000 mg | Freq: Once | INTRAVENOUS | Status: AC
  Administered 2020-09-01: 500 mg via INTRAVENOUS
  Filled 2020-09-01: qty 100

## 2020-09-01 MED ORDER — EPINEPHRINE PF 1 MG/ML IJ SOLN: 0.3000 mg | INTRAMUSCULAR | Status: DC | PRN

## 2020-09-01 MED ORDER — VANCOMYCIN HCL 1.25 G IV SOLR
1250.0000 mg | Freq: Two times a day (BID) | INTRAVENOUS | Status: DC
  Administered 2020-09-02: 1250 mg via INTRAVENOUS
  Filled 2020-09-01 (×3): qty 1250

## 2020-09-01 MED ORDER — PROMETHAZINE HCL 25 MG PO TABS
12.5000 mg | ORAL_TABLET | Freq: Two times a day (BID) | ORAL | Status: DC
  Administered 2020-09-02 (×2): 12.5 mg via ORAL
  Filled 2020-09-01 (×2): qty 1

## 2020-09-01 MED ORDER — TOPIRAMATE 100 MG PO TABS
200.0000 mg | ORAL_TABLET | Freq: Two times a day (BID) | ORAL | Status: DC
  Administered 2020-09-01 – 2020-09-04 (×6): 200 mg via ORAL
  Filled 2020-09-01 (×8): qty 2

## 2020-09-01 MED ORDER — IOHEXOL 300 MG/ML  SOLN
100.0000 mL | Freq: Once | INTRAMUSCULAR | Status: AC | PRN
  Administered 2020-09-01: 100 mL via INTRAVENOUS

## 2020-09-01 MED ORDER — OXYCODONE HCL 20 MG PO TABS: 1.0000 | ORAL_TABLET | Freq: Four times a day (QID) | ORAL | Status: DC | PRN

## 2020-09-01 MED ORDER — LOPERAMIDE HCL 2 MG PO CAPS: 4.0000 mg | ORAL_CAPSULE | ORAL | Status: DC | PRN

## 2020-09-01 MED ORDER — SODIUM CHLORIDE 0.9 % IV SOLN
2.0000 g | Freq: Three times a day (TID) | INTRAVENOUS | Status: DC
  Administered 2020-09-02 (×2): 2 g via INTRAVENOUS
  Filled 2020-09-01 (×2): qty 2

## 2020-09-01 MED ORDER — VANCOMYCIN HCL 10 G IV SOLR
2000.0000 mg | Freq: Once | INTRAVENOUS | Status: AC
  Administered 2020-09-01: 2000 mg via INTRAVENOUS
  Filled 2020-09-01: qty 2000

## 2020-09-01 MED ORDER — SODIUM CHLORIDE 0.9 % IV BOLUS
1000.0000 mL | Freq: Once | INTRAVENOUS | Status: AC
  Administered 2020-09-01: 1000 mL via INTRAVENOUS

## 2020-09-01 MED ORDER — FENTANYL CITRATE (PF) 100 MCG/2ML IJ SOLN
50.0000 ug | Freq: Once | INTRAMUSCULAR | Status: AC
Start: 2020-09-01 — End: 2020-09-01
  Administered 2020-09-01: 50 ug via INTRAVENOUS
  Filled 2020-09-01: qty 2

## 2020-09-01 MED ORDER — SODIUM CHLORIDE 0.9% FLUSH
3.0000 mL | Freq: Two times a day (BID) | INTRAVENOUS | Status: DC
  Administered 2020-09-01 – 2020-09-03 (×4): 3 mL via INTRAVENOUS

## 2020-09-01 MED ORDER — ACETAMINOPHEN 325 MG PO TABS: 650.0000 mg | ORAL_TABLET | Freq: Four times a day (QID) | ORAL | Status: DC | PRN

## 2020-09-01 MED ORDER — INSULIN ASPART 100 UNIT/ML IJ SOLN: 0.0000 [IU] | Freq: Every day | INTRAMUSCULAR | Status: DC

## 2020-09-01 MED ORDER — INSULIN GLARGINE 100 UNIT/ML ~~LOC~~ SOLN
30.0000 [IU] | Freq: Every day | SUBCUTANEOUS | Status: DC
  Administered 2020-09-01 – 2020-09-03 (×3): 30 [IU] via SUBCUTANEOUS
  Filled 2020-09-01 (×4): qty 0.3

## 2020-09-01 MED ORDER — PHENOL 1.4 % MT LIQD
1.0000 | OROMUCOSAL | Status: DC | PRN
  Administered 2020-09-02: 1 via OROMUCOSAL
  Filled 2020-09-01: qty 177

## 2020-09-01 MED ORDER — ACETAMINOPHEN 650 MG RE SUPP: 650.0000 mg | Freq: Four times a day (QID) | RECTAL | Status: DC | PRN

## 2020-09-01 MED ORDER — INSULIN ASPART 100 UNIT/ML IJ SOLN
8.0000 [IU] | Freq: Three times a day (TID) | INTRAMUSCULAR | Status: DC
  Administered 2020-09-03 – 2020-09-04 (×3): 8 [IU] via SUBCUTANEOUS

## 2020-09-01 MED ORDER — MELOXICAM 7.5 MG PO TABS
7.5000 mg | ORAL_TABLET | Freq: Two times a day (BID) | ORAL | Status: DC
  Administered 2020-09-01 – 2020-09-04 (×6): 7.5 mg via ORAL
  Filled 2020-09-01 (×8): qty 1

## 2020-09-01 MED ORDER — INSULIN ASPART 100 UNIT/ML IJ SOLN
0.0000 [IU] | Freq: Three times a day (TID) | INTRAMUSCULAR | Status: DC
  Administered 2020-09-02 – 2020-09-03 (×2): 2 [IU] via SUBCUTANEOUS

## 2020-09-01 MED ORDER — ASPIRIN EC 81 MG PO TBEC
81.0000 mg | DELAYED_RELEASE_TABLET | Freq: Every day | ORAL | Status: DC
  Administered 2020-09-02 – 2020-09-04 (×3): 81 mg via ORAL
  Filled 2020-09-01 (×3): qty 1

## 2020-09-01 MED ORDER — OXYCODONE HCL 5 MG PO TABS
10.0000 mg | ORAL_TABLET | Freq: Four times a day (QID) | ORAL | Status: DC | PRN
  Administered 2020-09-01 – 2020-09-02 (×2): 10 mg via ORAL
  Filled 2020-09-01 (×2): qty 2

## 2020-09-01 MED ORDER — ONDANSETRON HCL 4 MG/2ML IJ SOLN
4.0000 mg | Freq: Once | INTRAMUSCULAR | Status: AC
  Administered 2020-09-01: 4 mg via INTRAVENOUS
  Filled 2020-09-01: qty 2

## 2020-09-01 MED ORDER — ENOXAPARIN SODIUM 40 MG/0.4ML IJ SOSY: 40.0000 mg | PREFILLED_SYRINGE | INTRAMUSCULAR | Status: DC

## 2020-09-01 MED ORDER — SODIUM CHLORIDE 0.9 % IV SOLN: 2.0000 g | Freq: Once | INTRAVENOUS | Status: DC

## 2020-09-01 MED ORDER — BUPROPION HCL ER (XL) 150 MG PO TB24
150.0000 mg | ORAL_TABLET | Freq: Every morning | ORAL | Status: DC
  Administered 2020-09-02 – 2020-09-04 (×3): 150 mg via ORAL
  Filled 2020-09-01 (×3): qty 1

## 2020-09-01 MED ORDER — SODIUM CHLORIDE 0.9 % IV SOLN: Freq: Once | INTRAVENOUS | Status: AC

## 2020-09-01 MED ORDER — DULOXETINE HCL 60 MG PO CPEP
60.0000 mg | ORAL_CAPSULE | Freq: Every day | ORAL | Status: DC
  Administered 2020-09-02 – 2020-09-04 (×3): 60 mg via ORAL
  Filled 2020-09-01 (×3): qty 1

## 2020-09-01 MED ORDER — BACLOFEN 10 MG PO TABS
20.0000 mg | ORAL_TABLET | Freq: Three times a day (TID) | ORAL | Status: DC
  Administered 2020-09-01 – 2020-09-04 (×11): 20 mg via ORAL
  Filled 2020-09-01 (×5): qty 2
  Filled 2020-09-01 (×2): qty 1
  Filled 2020-09-01: qty 2
  Filled 2020-09-01: qty 1
  Filled 2020-09-01 (×2): qty 2
  Filled 2020-09-01: qty 1
  Filled 2020-09-01: qty 2
  Filled 2020-09-01: qty 1

## 2020-09-01 NOTE — ED Notes (Signed)
Patient transported to CT 

## 2020-09-01 NOTE — ED Provider Notes (Signed)
Emergency Medicine Provider Triage Evaluation Note  Andrea Wade , a 50 y.o. female  was evaluated in triage.  Pt complains of who presents with concern for 2 weeks of generalized abdominal pain worse in the left upper quadrant, nausea, vomiting, and diarrhea without hematemesis, hematochezia, or melena.  Additionally has sore throat x4 days, hypotensive in the waiting room with systolic in the 75T. Of note patient with extensive medical history including type 2 diabetes, Crohn's disease, RA, MS.  Not anticoagulated.  Review of Systems  Positive: Nausea, vomiting, diarrhea, lightheadedness, chills, sore throat Negative: Chest pain, shortness of breath, palpitations  Physical Exam  BP (!) 85/61 (BP Location: Left Arm)   Pulse 100   Temp 99.3 F (37.4 C) (Oral)   Resp 18   SpO2 96%  Gen:   Awake, no distress   Resp:  Normal effort  MSK:   Moves extremities without difficulty  Other:  Abdomen generally tender to palpation, worse in the left upper quadrant.  Borderline tachycardia, lungs CTA B.  Medical Decision Making  Medically screening exam initiated at 6:32 PM.  Appropriate orders placed.  Eran Windish was informed that the remainder of the evaluation will be completed by another provider, this initial triage assessment does not replace that evaluation, and the importance of remaining in the ED until their evaluation is complete.  Patient ill-appearing, being transferred to room 19 at this time.  This chart was dictated using voice recognition software, Dragon. Despite the best efforts of this provider to proofread and correct errors, errors may still occur which can change documentation meaning.    Aura Dials 09/01/20 1834    Arnaldo Natal, MD 09/01/20 2101

## 2020-09-01 NOTE — Progress Notes (Signed)
Pharmacy Antibiotic Note  Andrea Wade is a 50 y.o. female admitted on 09/01/2020 with sepsis.  Pharmacy has been consulted for cefepime and vancomycin dosing.  Patient endorses feeling badly and subjective fevers.  Penicillin allergy noted with reaction of Hives, Itching, Nausea And Vomiting. Patient has received multiple cephalosporin doses at this facility in the past including cefepime.  Plan: Cefepime substituted for aztreonam per pharmacy consult given has tolerated cephalosporins in the past Vancomycin 2035m once then start 1250 mg q12h for eAUC of 5Nometo adjust dosing/frequency per renal function F/u cultures  Height: 5' 8"  (172.7 cm) Weight: 100.2 kg (221 lb) IBW/kg (Calculated) : 63.9  Temp (24hrs), Avg:99.3 F (37.4 C), Min:99.3 F (37.4 C), Max:99.3 F (37.4 C)  Recent Labs  Lab 09/01/20 1828 09/01/20 1833 09/01/20 1840  WBC 18.7*  --   --   CREATININE 0.99  --  0.90  LATICACIDVEN  --  2.2*  --     Estimated Creatinine Clearance: 92.6 mL/min (by C-G formula based on SCr of 0.9 mg/dL).    Allergies  Allergen Reactions   Codeine Hives, Itching and Palpitations   Peanut-Containing Drug Products Anaphylaxis and Itching   Aspirin Itching    High doses only-baby is ok   Pecan Extract Allergy Skin Test Itching   Penicillins Hives, Itching and Nausea And Vomiting    Allergy from childhood Has patient had a PCN reaction causing immediate rash, facial/tongue/throat swelling, SOB or lightheadedness with hypotension: Yes Has patient had a PCN reaction causing severe rash involving mucus membranes or skin necrosis: Unk Has patient had a PCN reaction that required hospitalization: Unk Has patient had a PCN reaction occurring within the last 10 years: No If all of the above answers are "NO", then may proceed with Cephalosporin use.     Tape Other (See Comments)    Slight irritation Other reaction(s): Other (See Comments) Slight irritation     Antimicrobials this admission: Cefepime 7/3 >>  Vancomycin 7/3 >>   Microbiology results: Pending  Thank you for allowing pharmacy to be a part of this patient's care.  JLorelei Pont PharmD, BCPS 09/01/2020 9:43 PM ED Clinical Pharmacist -  [phone]

## 2020-09-01 NOTE — ED Triage Notes (Signed)
Pt reports LUQ pain x 2 weeks with nausea, vomiting, and diarrhea.  Reports burning in throat and sore throat for 3-4 days.  Sent from [facility].  Hypotensive.

## 2020-09-01 NOTE — H&P (Signed)
History and Physical   Andrea Wade ZSM:[id number] DOB: 1970-03-07 DOA: 09/01/2020  PCP: Jeanie Sewer, NP   Patient coming from: Home then urgent care  Chief Complaint: Left upper quadrant pain, sore throat, hypotension  HPI: Andrea Wade is a 50 y.o. female with medical history significant of anemia, chronic pain, diabetes, GERD, seizure, Crohn's disease, RA, MS, TIA who presents with ongoing abdominal pain, sore throat. Patient has had left upper quadrant pain for the past 2 weeks with nausea vomiting and diarrhea.  Diarrhea has now become so frequent that the episodes are too many to count.  She also reports a sore throat for the past 3 to 4 days.  She presented to urgent care initially but was noted to have hypotension and was referred to the ED. Of note her blood pressure has been on the lower side recently for the last 1 month multiple outpatient visits in the 492E systolic.  With 1 visit in the last week with a blood pressure in the 10O to 71Q systolic.  She states she has been working with her neurologist to figure out what is going on.  She does report a couple episodes of syncope. She denies fevers, chills, chest pain, shortness of breath, constipation.  ED Course: Vital signs in the ED initially significant for blood pressure in the ED 19X systolic within normal map this is improved to the 100s with IV fluid.  Vital signs otherwise stable.  Lab work-up showed CMP with sodium 132, bicarb 18, glucose 182, calcium 8.8, albumin 3.0.  CBC showed leukocytosis to 18.7.  PT, PTT, INR within normal limits.  Lipase normal.  Lactic acid 2.2 with repeat pending.  Respiratory panel for flu and COVID-negative.  Creatinine group A strep swab positive.  Urinalysis, urine culture, blood cultures pending.  Chest x-ray with no acute abnormality but did demonstrate low lung volumes.  CT of the abdomen pelvis showed no acute abnormality.  Patient started on broad-spectrum antibiotics with bank,  cefepime, Flagyl in the ED.  Also received dose of fentanyl, Zofran.  Was also given 2 L of IV fluids and started on a rate of 150 cc an hour.  Review of Systems: As per HPI otherwise all other systems reviewed and are negative.  Past Medical History:  Diagnosis Date   Anemia    Anxiety    Arthritis    "joints" (04/17/2013)   Chronic back pain    "all over" (04/17/2013)   Crohn disease ([place]    Daily headache    Depression    Diabetes type 2, uncontrolled ([place])    Epileptic ([place])    "had 1-2/night; started RX; last one was 01/2014"   GERD (gastroesophageal reflux disease)    Migraine    "q day" (04/17/2013)   MS (multiple sclerosis) ([place])    Pneumonia    "about 10 times in my lifetime" (04/17/2013)    Past Surgical History:  Procedure Laterality Date   CARDIAC CATHETERIZATION N/A 10/08/2015   Procedure: Left Heart Cath and Coronary Angiography;  Surgeon: Peter M Martinique, MD;  Location: [facility];  Service: Cardiovascular;  Laterality: N/A;   [place] History  reports that she has quit smoking. Her smoking use included cigarettes. She has a 10.50 pack-year smoking history. She has never used smokeless tobacco. She reports that she does not drink alcohol and does not use drugs.  Allergies  Allergen Reactions   Codeine  Hives, Itching and Palpitations   Peanut-Containing Drug Products Anaphylaxis and Itching   Aspirin Itching    High doses only-baby is ok   Pecan Extract Allergy Skin Test Itching   Penicillins Hives, Itching and Nausea And Vomiting    Allergy from childhood Has patient had a PCN reaction causing immediate rash, facial/tongue/throat swelling, SOB or lightheadedness with hypotension: Yes Has patient had a PCN reaction causing severe rash involving mucus membranes or skin necrosis: Unk Has patient had a PCN reaction that required hospitalization: Unk Has patient had a PCN reaction occurring within the last 10  years: No If all of the above answers are "NO", then may proceed with Cephalosporin use.     Tape Other (See Comments)    Slight irritation Other reaction(s): Other (See Comments) Slight irritation    Family History  Problem Relation Age of Onset   Diabetes Mother    Heart disease Mother    Stroke Mother    Heart failure Mother    Heart disease Father    Stroke Father    Heart failure Father    Heart attack Brother    Healthy Brother   Reviewed on admission  Prior to Admission medications   Medication Sig Start Date End Date Taking? Authorizing Provider  aspirin EC 81 MG tablet Take 81 mg by mouth daily. Swallow whole.   Yes [provider]  baclofen (LIORESAL) 20 MG tablet Take 20 mg by mouth 4 (four) times daily. 03/15/20  Yes [provider]  buPROPion (WELLBUTRIN XL) 150 MG 24 hr tablet Take 1 tablet by mouth every morning. 07/31/20  Yes [provider]  DULoxetine (CYMBALTA) 60 MG capsule Take 60 mg by mouth daily.   Yes [provider]  EPINEPHrine 0.3 mg/0.3 mL IJ SOAJ injection Inject 0.3 mg into the muscle as needed for anaphylaxis. 11/18/16  Yes [provider]  insulin aspart (NOVOLOG) 100 UNIT/ML FlexPen Inject 16-24 Units into the skin 3 (three) times daily with meals. SLIDING SCALE   Yes [provider]  lisinopril (PRINIVIL,ZESTRIL) 5 MG tablet Take 1 tablet (5 mg total) by mouth daily. 10/08/15  Yes Rai, Ripudeep K, MD  meloxicam (MOBIC) 7.5 MG tablet Take 7.5 mg by mouth 2 (two) times daily.   Yes [provider]  nortriptyline (PAMELOR) 75 MG capsule Take 75 mg by mouth at bedtime. 07/30/20  Yes [provider]  Oxycodone HCl 20 MG TABS Take 1 tablet by mouth 4 (four) times daily as needed (pain). 07/18/20  Yes [provider]  pantoprazole (PROTONIX) 40 MG tablet Take 40 mg by mouth daily.   Yes [provider]  promethazine (PHENERGAN) 12.5 MG tablet Take 12.5 mg by mouth 2 (two)  times daily.   Yes [provider]  Semaglutide,0.25 or 0.5MG/DOS, (OZEMPIC, 0.25 OR 0.5 MG/DOSE,) 2 MG/1.5ML SOPN Inject 0.25 mg into the skin once a week. Take on Sunday 08/13/20  Yes [provider]  SYNJARDY XR 11-998 MG TB24 Take 1 tablet by mouth in the morning and at bedtime. 07/30/20  Yes [provider]  topiramate (TOPAMAX) 200 MG tablet Take 200 mg by mouth 2 (two) times daily. 04/29/20  Yes [provider]  TRESIBA FLEXTOUCH 100 UNIT/ML FlexTouch Pen Inject 50 Units into the skin at bedtime. 08/13/20  Yes [provider]  pantoprazole (PROTONIX) 40 MG tablet Take 1 tablet (40 mg total) by mouth daily. Patient not taking: Reported on 09/01/2020 11/16/13   Lorayne Marek, MD  Physical Exam: Vitals:   09/01/20 2000 09/01/20 2030 09/01/20 2130 09/01/20 2200  BP: (!) 85/57 (!) 101/54 110/67 102/66  Pulse: 87 86 86 99  Resp: (!) 24 (!) 21 (!) 28 (!) 27  Temp:      TempSrc:      SpO2: 98% 100% 98% 99%  Weight:      Height:       Physical Exam Constitutional:      General: She is not in acute distress.    Appearance: Normal appearance.  HENT:     Head: Normocephalic and atraumatic.     Nose: Nose normal.     Mouth/Throat:     Mouth: Mucous membranes are moist.  Eyes:     Extraocular Movements: Extraocular movements intact.     Pupils: Pupils are equal, round, and reactive to light.  Cardiovascular:     Rate and Rhythm: Normal rate and regular rhythm.     Pulses: Normal pulses.     Heart sounds: Normal heart sounds.  Pulmonary:     Effort: Pulmonary effort is normal. No respiratory distress.     Breath sounds: Normal breath sounds.  Abdominal:     General: Bowel sounds are normal. There is no distension.     Palpations: Abdomen is soft.     Tenderness: There is no abdominal tenderness.  Musculoskeletal:        General: No swelling or deformity.  Skin:    General: Skin is warm and dry.  Neurological:     General: No focal  deficit present.     Mental Status: Mental status is at baseline.   Labs on Admission: I have personally reviewed following labs and imaging studies  CBC: Recent Labs  Lab 09/01/20 1828 09/01/20 1840  WBC 18.7*  --   HGB 12.1 13.6  HCT 40.5 40.0  MCV 85.3  --   PLT 319  --     Basic Metabolic Panel: Recent Labs  Lab 09/01/20 1828 09/01/20 1840  NA 132* 135  K 4.2 4.3  CL 105 108  CO2 18*  --   GLUCOSE 182* 187*  BUN 16 16  CREATININE 0.99 0.90  CALCIUM 8.8*  --     GFR: Estimated Creatinine Clearance: 92.6 mL/min (by C-G formula based on SCr of 0.9 mg/dL).  Liver Function Tests: Recent Labs  Lab 09/01/20 1828  AST 15  ALT 13  ALKPHOS 86  BILITOT 0.5  PROT 7.8  ALBUMIN 3.0*    Urine analysis:    Component Value Date/Time   COLORURINE YELLOW 04/16/2016 0205   APPEARANCEUR CLOUDY (A) 04/16/2016 0205   LABSPEC 1.021 04/16/2016 0205   PHURINE 5.0 04/16/2016 0205   GLUCOSEU >=500 (A) 04/16/2016 0205   HGBUR SMALL (A) 04/16/2016 0205   BILIRUBINUR NEGATIVE 04/16/2016 0205   KETONESUR NEGATIVE 04/16/2016 0205   PROTEINUR 30 (A) 04/16/2016 0205   UROBILINOGEN 0.2 07/22/2013 1703   NITRITE NEGATIVE 04/16/2016 0205   LEUKOCYTESUR MODERATE (A) 04/16/2016 0205    Radiological Exams on Admission: CT ABDOMEN PELVIS W CONTRAST  Result Date: 09/01/2020 CLINICAL DATA:  Left lower quadrant abdominal pain, nausea, vomiting, and diarrhea for 2 weeks. Sore throat for 4 days. EXAM: CT ABDOMEN AND PELVIS WITH CONTRAST TECHNIQUE: Multidetector CT imaging of the abdomen and pelvis was performed using the standard protocol following bolus administration of intravenous contrast. CONTRAST:  182m OMNIPAQUE IOHEXOL 300 MG/ML  SOLN COMPARISON:  07/26/2019 FINDINGS: Lower chest: Lung bases are clear. Hepatobiliary: No focal liver  abnormality is seen. Status post cholecystectomy. No biliary dilatation. Pancreas: Unremarkable. No pancreatic ductal dilatation or surrounding  inflammatory changes. Spleen: Normal in size without focal abnormality. Adrenals/Urinary Tract: Adrenal glands are unremarkable. Kidneys are normal, without renal calculi, focal lesion, or hydronephrosis. Bladder is unremarkable. Stomach/Bowel: Stomach, small bowel, and colon are not abnormally distended. No wall thickening or inflammatory changes are appreciated. Appendix is not identified. Vascular/Lymphatic: No significant vascular findings are present. No enlarged abdominal or pelvic lymph nodes. Reproductive: Uterus and bilateral adnexa are unremarkable. Surgical clips consistent with tubal ligations. Other: No free air or free fluid in the abdomen. Minimal periumbilical hernia containing fat. Musculoskeletal: No acute or significant osseous findings. IMPRESSION: No acute abnormalities demonstrated in the abdomen or pelvis. No evidence of bowel obstruction or inflammation. Electronically Signed   By: Lucienne Capers M.D.   On: 09/01/2020 20:33   DG Chest Port 1 View  Result Date: 09/01/2020 CLINICAL DATA:  Questionable sepsis - evaluate for abnormality EXAM: PORTABLE CHEST 1 VIEW COMPARISON:  Radiograph 01/14/2020 FINDINGS: Lung volumes are low.The cardiomediastinal contours are normal. The lungs are clear. Pulmonary vasculature is normal. No consolidation, pleural effusion, or pneumothorax. No acute osseous abnormalities are seen. IMPRESSION: Low lung volumes without acute abnormality. Electronically Signed   By: Keith Rake M.D.   On: 09/01/2020 19:43    EKG: Independently reviewed.  Sinus rhythm at 94 bpm.  RSR prime in V1.  Nonspecific intraventricular conduction delay.  Some T wave flattening diffusely RSR prime new from previous, T wave flattening similar to previous.  Assessment/Plan Principal Problem:   Hypotension Active Problems:   Multiple sclerosis (HCC)   DM2 (diabetes mellitus, type 2) (HCC)   Seizure (HCC)   Gastroesophageal reflux disease   Hyponatremia   Chronic pain  syndrome   Group A streptococcal infection   Diarrhea   Leukocytosis   Rheumatoid arthritis  Hypotension Group A strep Diarrhea ?Sepsis > Patient presenting with couple weeks of left upper quadrant pain nausea vomiting diarrhea.  With diarrhea episodes becoming too frequent to count.  Also with sore throat x3 to 4 days.  And found to be hypotensive. > Of note she has had some lower blood pressures for the past month.  She states she has continued to take her lisinopril. > Blood pressure likely low due to chronic low normal blood pressure with ongoing diarrhea.  However no significant creatinine elevation to indicate severe hydration and has leukocytosis to 18.7 with concern of possible developing sepsis considering her blood pressure was as low as the 85U systolic. > Noted to have strep throat by swab we will also check GI pathogen panel to evaluate source of frequent diarrhea and to rule out GI source of leukocytosis. > Continue with broad-spectrum antibiotics pending culture results - Monitor on telemetry - Continue with IV fluids - Continue with broad-spectrum antibiotics overnight until culture results return - Follow-up UA, urine culture, blood culture - Hold lisinopril  - Imodium as needed  Syncope > Reports some episodes of syncope during the last month with low blood pressures. > Episodes likely orthostatic given her ongoing lower blood pressure - We will keep on monitoring either way - Check orthostatics - Consider further work-up, pending initial results  Hyponatremia > Mild in the setting of diarrhea.  Has received 2 L in the ED. - Monitor results to IV fluids, trend electrolytes  Diabetes > On Tresiba 50 units nightly and 16 to 24 units with meals at home -We will start Lantus 30  units nightly, NovoLog 8 units 3 times daily, SSI  MS  Lupus Chronic pain Rheumatoid arthritis - Continue home Mobic, baclofen, as needed oxycodone - Continue duloxetine - Hold  nortriptyline  Bipolar - Continue home duloxetine and bupropion - Hold home nortriptyline  Seizure disorder - Continue home Topamax  GERD > Also NSAID use. - Continue home PPI  DVT prophylaxis: Lovenox  Code Status:   Full  Family Communication:  Daughter was on the phone with patient during our conversation and per patient heard the discussion. Disposition Plan:   Patient is from:  Home  Anticipated DC to:  Home  Anticipated DC date:  1 to 3 days  Anticipated DC barriers: None  Consults called:  None Admission status:  Observation, telemetry   Severity of Illness: The appropriate patient status for this patient is OBSERVATION. Observation status is judged to be reasonable and necessary in order to provide the required intensity of service to ensure the patient's safety. The patient's presenting symptoms, physical exam findings, and initial radiographic and laboratory data in the context of their medical condition is felt to place them at decreased risk for further clinical deterioration. Furthermore, it is anticipated that the patient will be medically stable for discharge from the hospital within 2 midnights of admission. The following factors support the patient status of observation.   " The patient's presenting symptoms include left upper quadrant pain, nausea, vomiting, diarrhea, sore throat. " The physical exam findings include stable physical exam. " The initial radiographic and laboratory data are leukocytosis to 18.7, lactic acid 2.2 with repeat pending.  Group A strep positive.  Glucose 182, sodium 132.  Marcelyn Bruins MD [facility]  How to contact the [facility] Attending or Consulting provider [place] or covering provider during after hours [place], for this patient?   Check the care team in [facility] and look for a) attending/consulting [facility] provider listed and b) the [facility] team listed Log into [email or web address] and use [place]'s universal password to access. If you do  not have the password, please contact the hospital operator. Locate the [facility] provider you are looking for under [facility] and page to a number that you can be directly reached. If you still have difficulty reaching the provider, please page the [facility] (Director on Call) for the Hospitalists listed on amion for assistance.  09/01/2020, 10:27 PM

## 2020-09-01 NOTE — ED Notes (Signed)
Moved to 36 at this time

## 2020-09-01 NOTE — ED Provider Notes (Signed)
Emergency Department Provider Note   I have reviewed the triage vital signs and the nursing notes.   HISTORY  Chief Complaint Abdominal Pain   HPI Andrea Wade is a 50 y.o. female with past medical history reviewed below including diabetes, lupus, RA, presents to the emergency department with abdominal pain, vomiting, diarrhea.  She is had some associated sore throat.  She is feeling subjective fever at home but has not measured a temperature.  She states that she felt badly for the past 2 weeks.  The only new medication is that her PCP started her on Ozempic but has not had changes to other medicines.  She denies seeing any blood in the emesis or stool.  She is having too many episodes of diarrhea daily to count.  She estimates 3-4 times vomiting each day including today.  She has not been on antibiotics recently.  No recent travel.  She is not having chest pain or shortness of breath.  No symptoms like this in the past.  She tells me that she feels like her spleen is enlarged and is having more focal burning pain in the left upper quadrant.   Past Medical History:  Diagnosis Date   Anemia    Anxiety    Arthritis    "joints" (04/17/2013)   Chronic back pain    "all over" (04/17/2013)   Crohn disease ([place])    Daily headache    Depression    Diabetes type 2, uncontrolled ([place])    Epileptic ([place])    "had 1-2/night; started RX; last one was 01/2014"   GERD (gastroesophageal reflux disease)    Migraine    "q day" (04/17/2013)   MS (multiple sclerosis) ([place])    Pneumonia    "about 10 times in my lifetime" (04/17/2013)    Patient Active Problem List   Diagnosis Date Noted   Dizziness 09/02/2020   Group A streptococcal infection 09/01/2020   Diarrhea 09/01/2020   Leukocytosis 09/01/2020   Rheumatoid arthritis 09/01/2020   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   Influenza 04/16/2016   HCAP (healthcare-associated pneumonia) 04/16/2016    Hypotension 04/16/2016   Acute respiratory failure ([place]) 04/16/2016   Sepsis ([place]) 04/15/2016   Pyelonephritis 03/29/2016   Chronic pain syndrome 03/29/2016   Hypochloremia    Hyponatremia    Unspecified osteoarthritis, unspecified site 06/20/2015   Diabetes mellitus due to underlying condition without complications ([place]) 91/66/0600   Anemia, unspecified 10/05/2013   Hypokalemia 10/05/2013   Smoking 10/05/2013   Menorrhagia with regular cycle 10/05/2013   Gastroesophageal reflux disease 10/05/2013   Chest pain 04/17/2013   Seizure ([place]) 02/24/2013   Atypical chest pain 09/22/2012   Multiple sclerosis ([place]) 09/22/2012   Noncompliance 09/22/2012   Tobacco abuse 09/22/2012   DM2 (diabetes mellitus, type 2) ([place]) 09/22/2012   Protein-calorie malnutrition, severe ([place]) 09/22/2012    Past Surgical History:  Procedure Laterality Date   CARDIAC CATHETERIZATION N/A 10/08/2015   Procedure: Left Heart Cath and Coronary Angiography;  Surgeon: Peter M Martinique, MD;  Location: [facility];  Service: Cardiovascular;  Laterality: N/A;   CHOLECYSTECTOMY  1994   TUBAL LIGATION  1993    Allergies Codeine, Peanut-containing drug products, Aspirin, Pecan extract allergy skin test, Penicillins, and Tape  Family History  Problem Relation Age of Onset   Diabetes Mother    Heart disease Mother    Stroke Mother    Heart failure Mother    Heart disease Father  Stroke Father    Heart failure Father    Heart attack Brother    Healthy Brother     Social History Social History   Tobacco Use   Smoking status: Former    Packs/day: 0.50    Years: 21.00    Pack years: 10.50    Types: Cigarettes   Smokeless tobacco: Never   Tobacco comments:    Quit in 2020  Substance Use Topics   Alcohol use: No   Drug use: No    Review of Systems  Constitutional: Positive fatigue. Positive subjective fever.  Eyes: No visual changes. ENT: Positive sore throat. Cardiovascular: Denies chest  pain. Respiratory: Denies shortness of breath. Gastrointestinal: Positive abdominal pain. Positive nausea, vomiting, and diarrhea.  No constipation. Genitourinary: Negative for dysuria. Musculoskeletal: Negative for back pain. Skin: Negative for rash. Neurological: Negative for headaches, focal weakness or numbness.  10-point ROS otherwise negative.  ____________________________________________   PHYSICAL EXAM:  VITAL SIGNS: ED Triage Vitals  Enc Vitals Group     BP 09/01/20 1823 (!) 85/61     Pulse Rate 09/01/20 1823 100     Resp 09/01/20 1823 18     Temp 09/01/20 1823 99.3 F (37.4 C)     Temp Source 09/01/20 1823 Oral     SpO2 09/01/20 1823 96 %   Constitutional: Alert and oriented. Well appearing and in no acute distress. Eyes: Conjunctivae are normal.  Head: Atraumatic. Nose: No congestion/rhinnorhea. Mouth/Throat: Mucous membranes are dry. Erythematous posterior pharynx w/o PTA. No trismus. Clear voice. Managing oral secretions.  Neck: No stridor.   Cardiovascular: Normal rate, regular rhythm. Good peripheral circulation. Grossly normal heart sounds.   Respiratory: Normal respiratory effort.  No retractions. Lungs CTAB. Gastrointestinal: Soft with mild diffuse tenderness. No distention.  Musculoskeletal: No lower extremity tenderness nor edema. No gross deformities of extremities. Neurologic:  Normal speech and language. No gross focal neurologic deficits are appreciated.  Skin:  Skin is warm, dry and intact. No rash noted.   ____________________________________________   LABS (all labs ordered are listed, but only abnormal results are displayed)  Labs Reviewed  GROUP A STREP BY PCR - Abnormal; Notable for the following components:      Result Value   Group A Strep by PCR DETECTED (*)    All other components within normal limits  COMPREHENSIVE METABOLIC PANEL - Abnormal; Notable for the following components:   Sodium 132 (*)    CO2 18 (*)    Glucose, Bld 182  (*)    Calcium 8.8 (*)    Albumin 3.0 (*)    All other components within normal limits  CBC - Abnormal; Notable for the following components:   WBC 18.7 (*)    MCH 25.5 (*)    MCHC 29.9 (*)    RDW 16.4 (*)    All other components within normal limits  URINALYSIS, ROUTINE W REFLEX MICROSCOPIC - Abnormal; Notable for the following components:   Color, Urine STRAW (*)    Glucose, UA >=500 (*)    All other components within normal limits  LACTIC ACID, PLASMA - Abnormal; Notable for the following components:   Lactic Acid, Venous 2.2 (*)    All other components within normal limits  CBC - Abnormal; Notable for the following components:   WBC 15.4 (*)    Hemoglobin 10.8 (*)    HCT 35.2 (*)    MCH 25.7 (*)    RDW 16.4 (*)    All other components within normal  limits  COMPREHENSIVE METABOLIC PANEL - Abnormal; Notable for the following components:   CO2 20 (*)    Glucose, Bld 122 (*)    Calcium 8.2 (*)    Albumin 2.5 (*)    AST 11 (*)    All other components within normal limits  HEMOGLOBIN A1C - Abnormal; Notable for the following components:   Hgb A1c MFr Bld 10.5 (*)    All other components within normal limits  GLUCOSE, CAPILLARY - Abnormal; Notable for the following components:   Glucose-Capillary 132 (*)    All other components within normal limits  GLUCOSE, CAPILLARY - Abnormal; Notable for the following components:   Glucose-Capillary 138 (*)    All other components within normal limits  I-STAT CHEM 8, ED - Abnormal; Notable for the following components:   Glucose, Bld 187 (*)    Calcium, Ion 1.03 (*)    TCO2 16 (*)    All other components within normal limits  CBG MONITORING, ED - Abnormal; Notable for the following components:   Glucose-Capillary 134 (*)    All other components within normal limits  CBG MONITORING, ED - Abnormal; Notable for the following components:   Glucose-Capillary 113 (*)    All other components within normal limits  CBG MONITORING, ED -  Abnormal; Notable for the following components:   Glucose-Capillary 103 (*)    All other components within normal limits  CBG MONITORING, ED - Abnormal; Notable for the following components:   Glucose-Capillary 110 (*)    All other components within normal limits  CULTURE, BLOOD (ROUTINE X 2)  CULTURE, BLOOD (ROUTINE X 2)  RESP PANEL BY RT-PCR (FLU A&B, COVID) ARPGX2  C DIFFICILE QUICK SCREEN W PCR REFLEX    URINE CULTURE  GASTROINTESTINAL PANEL BY PCR, STOOL (REPLACES STOOL CULTURE)  LIPASE, BLOOD  LACTIC ACID, PLASMA  PROTIME-INR  APTT  HIV ANTIBODY (ROUTINE TESTING W REFLEX)  I-STAT BETA HCG BLOOD, ED ([facility], [facility], [facility] ONLY)   ____________________________________________  EKG   EKG Interpretation  Date/Time:  Sunday September 01 2020 18:57:57 EDT Ventricular Rate:  94 PR Interval:  185 QRS Duration: 107 QT Interval:  343 QTC Calculation: 429 R Axis:   270 Text Interpretation: Sinus rhythm LAD, consider left anterior fascicular block Abnormal R-wave progression, late transition Borderline T wave abnormalities No previous ECGs available Confirmed by Theotis Burrow [phone]) on 09/02/2020 9:52:31 PM         ____________________________________________  RADIOLOGY  CT HEAD WO CONTRAST  Result Date: 09/02/2020 CLINICAL DATA:  Vertigo. EXAM: CT HEAD WITHOUT CONTRAST TECHNIQUE: Contiguous axial images were obtained from the base of the skull through the vertex without intravenous contrast. COMPARISON:  January 14, 2020. FINDINGS: Brain: No evidence of acute infarction, hemorrhage, hydrocephalus, extra-axial collection or mass lesion/mass effect. Vascular: No hyperdense vessel or unexpected calcification. Skull: Normal. Negative for fracture or focal lesion. Sinuses/Orbits: Small fluid levels are noted in both maxillary sinuses suggesting possible sinusitis. Other: None. IMPRESSION: Small fluid levels are noted in both maxillary sinuses suggesting sinusitis. No acute intracranial abnormality  seen. Electronically Signed   By: Marijo Conception M.D.   On: 09/02/2020 15:36    ____________________________________________   PROCEDURES  Procedure(s) performed:   Procedures  CRITICAL CARE Performed by: Margette Fast Total critical care time: 35 minutes Critical care time was exclusive of separately billable procedures and treating other patients. Critical care was necessary to treat or prevent imminent or life-threatening deterioration. Critical care was time spent personally by  me on the following activities: development of treatment plan with patient and/or surrogate as well as nursing, discussions with consultants, evaluation of patient's response to treatment, examination of patient, obtaining history from patient or surrogate, ordering and performing treatments and interventions, ordering and review of laboratory studies, ordering and review of radiographic studies, pulse oximetry and re-evaluation of patient's condition.  Nanda Quinton, MD Emergency Medicine  ____________________________________________   INITIAL IMPRESSION / ASSESSMENT AND PLAN / ED COURSE  Pertinent labs & imaging results that were available during my care of the patient were reviewed by me and considered in my medical decision making (see chart for details).   Patient arrives to the emergency department with report of diarrhea, vomiting, poor appetite.  Subjective fever at home along with sore throat.  Arrives hypotensive which I suspect is hypovolemic.  Lower suspicion for sepsis but will send lactate, cultures, and cover with antibiotics if needed.  Chest x-ray with no acute findings.  CT abdomen pelvis reviewed.  Patient does have strep positive on her PCR throat swab.  Lactate is mildly elevated.  Will cover with broad-spectrum antibiotics.  Patient is responding to IV fluid boluses and blood pressure improving. No indication for pressors.   Discussed patient's case with [facility] to request admission. Patient  and family (if present) updated with plan. Care transferred to [facility] service.  I reviewed all nursing notes, vitals, pertinent old records, EKGs, labs, imaging (as available).    ____________________________________________  FINAL CLINICAL IMPRESSION(S) / ED DIAGNOSES  Final diagnoses:  Hypotension, unspecified hypotension type  Nausea vomiting and diarrhea  Strep pharyngitis     MEDICATIONS GIVEN DURING THIS VISIT:  Medications  aspirin EC tablet 81 mg (81 mg Oral Given 09/02/20 1013)  meloxicam (MOBIC) tablet 7.5 mg (7.5 mg Oral Given 09/02/20 1729)  EPINEPHrine (ADRENALIN) 0.3 mg (has no administration in time range)  DULoxetine (CYMBALTA) DR capsule 60 mg (60 mg Oral Given 09/02/20 1012)  buPROPion (WELLBUTRIN XL) 24 hr tablet 150 mg (150 mg Oral Given 09/02/20 1016)  topiramate (TOPAMAX) tablet 200 mg (200 mg Oral Given 09/02/20 2250)  baclofen (LIORESAL) tablet 20 mg (20 mg Oral Given 09/02/20 2202)  sodium chloride flush (NS) 0.9 % injection 3 mL (3 mLs Intravenous Given 09/02/20 2252)  acetaminophen (TYLENOL) tablet 650 mg (has no administration in time range)    Or  acetaminophen (TYLENOL) suppository 650 mg (has no administration in time range)  insulin glargine (LANTUS) injection 30 Units (30 Units Subcutaneous Given 09/02/20 2250)  insulin aspart (novoLOG) injection 0-15 Units (2 Units Subcutaneous Given 09/02/20 1740)  insulin aspart (novoLOG) injection 0-5 Units (0 Units Subcutaneous Not Given 09/02/20 2243)  insulin aspart (novoLOG) injection 8 Units (8 Units Subcutaneous Not Given 09/02/20 1840)  phenol (CHLORASEPTIC) mouth spray 1 spray (1 spray Mouth/Throat Given 09/02/20 0037)  0.9 %  sodium chloride infusion ( Intravenous New Bag/Given 09/03/20 0557)  enoxaparin (LOVENOX) injection 50 mg (50 mg Subcutaneous Given 09/02/20 1732)  nortriptyline (PAMELOR) capsule 75 mg (75 mg Oral Given 09/02/20 2250)  promethazine (PHENERGAN) tablet 12.5 mg (has no administration in time range)  cefTRIAXone  (ROCEPHIN) 2 g in sodium chloride 0.9 % 100 mL IVPB (2 g Intravenous New Bag/Given 09/02/20 2032)  oxyCODONE (Oxy IR/ROXICODONE) immediate release tablet 10 mg (10 mg Oral Given 09/03/20 0553)  senna-docusate (Senokot-S) tablet 2 tablet (2 tablets Oral Given 09/02/20 2202)  naloxone [facility]) injection 0.4 mg (has no administration in time range)  sodium chloride 0.9 % bolus 1,000 mL (0  mLs Intravenous Stopped 09/01/20 2037)  ondansetron (ZOFRAN) injection 4 mg (4 mg Intravenous Given 09/01/20 2025)  sodium chloride 0.9 % bolus 1,000 mL (0 mLs Intravenous Stopped 09/01/20 2229)  0.9 %  sodium chloride infusion ( Intravenous Stopped 09/02/20 0624)  fentaNYL (SUBLIMAZE) injection 50 mcg (50 mcg Intravenous Given 09/01/20 2028)  iohexol (OMNIPAQUE) 300 MG/ML solution 100 mL (100 mLs Intravenous Contrast Given 09/01/20 2025)  metroNIDAZOLE (FLAGYL) IVPB 500 mg (0 mg Intravenous Stopped 09/01/20 2228)  vancomycin (VANCOCIN) 2,000 mg in sodium chloride 0.9 % 500 mL IVPB (0 mg Intravenous Stopped 09/02/20 0039)  potassium chloride SA (KLOR-CON) CR tablet 40 mEq (40 mEq Oral Given 09/02/20 1012)     Note:  This document was prepared using Dragon voice recognition software and may include unintentional dictation errors.  Nanda Quinton, MD, [facility] Emergency Medicine    Takeisha Cianci, [facility], MD 09/03/20 [phone]

## 2020-09-02 ENCOUNTER — Observation Stay (HOSPITAL_COMMUNITY): Payer: Medicare Other

## 2020-09-02 DIAGNOSIS — Z833 Family history of diabetes mellitus: Secondary | ICD-10-CM | POA: Diagnosis not present

## 2020-09-02 DIAGNOSIS — B950 Streptococcus, group A, as the cause of diseases classified elsewhere: Secondary | ICD-10-CM | POA: Diagnosis present

## 2020-09-02 DIAGNOSIS — R42 Dizziness and giddiness: Secondary | ICD-10-CM | POA: Diagnosis not present

## 2020-09-02 DIAGNOSIS — B9629 Other Escherichia coli [E. coli] as the cause of diseases classified elsewhere: Secondary | ICD-10-CM | POA: Diagnosis present

## 2020-09-02 DIAGNOSIS — Z91048 Other nonmedicinal substance allergy status: Secondary | ICD-10-CM | POA: Diagnosis not present

## 2020-09-02 DIAGNOSIS — Z8249 Family history of ischemic heart disease and other diseases of the circulatory system: Secondary | ICD-10-CM | POA: Diagnosis not present

## 2020-09-02 DIAGNOSIS — Z885 Allergy status to narcotic agent status: Secondary | ICD-10-CM | POA: Diagnosis not present

## 2020-09-02 DIAGNOSIS — Z20822 Contact with and (suspected) exposure to covid-19: Secondary | ICD-10-CM | POA: Diagnosis present

## 2020-09-02 DIAGNOSIS — Z8673 Personal history of transient ischemic attack (TIA), and cerebral infarction without residual deficits: Secondary | ICD-10-CM | POA: Diagnosis not present

## 2020-09-02 DIAGNOSIS — E119 Type 2 diabetes mellitus without complications: Secondary | ICD-10-CM | POA: Diagnosis present

## 2020-09-02 DIAGNOSIS — J020 Streptococcal pharyngitis: Secondary | ICD-10-CM | POA: Diagnosis present

## 2020-09-02 DIAGNOSIS — G894 Chronic pain syndrome: Secondary | ICD-10-CM | POA: Diagnosis present

## 2020-09-02 DIAGNOSIS — R55 Syncope and collapse: Secondary | ICD-10-CM | POA: Diagnosis not present

## 2020-09-02 DIAGNOSIS — E871 Hypo-osmolality and hyponatremia: Secondary | ICD-10-CM | POA: Diagnosis present

## 2020-09-02 DIAGNOSIS — F319 Bipolar disorder, unspecified: Secondary | ICD-10-CM | POA: Diagnosis present

## 2020-09-02 DIAGNOSIS — G35 Multiple sclerosis: Secondary | ICD-10-CM | POA: Diagnosis present

## 2020-09-02 DIAGNOSIS — Z880 Allergy status to penicillin: Secondary | ICD-10-CM | POA: Diagnosis not present

## 2020-09-02 DIAGNOSIS — K5090 Crohn's disease, unspecified, without complications: Secondary | ICD-10-CM | POA: Diagnosis present

## 2020-09-02 DIAGNOSIS — G43909 Migraine, unspecified, not intractable, without status migrainosus: Secondary | ICD-10-CM | POA: Diagnosis present

## 2020-09-02 DIAGNOSIS — K219 Gastro-esophageal reflux disease without esophagitis: Secondary | ICD-10-CM | POA: Diagnosis present

## 2020-09-02 DIAGNOSIS — Z91010 Allergy to peanuts: Secondary | ICD-10-CM | POA: Diagnosis not present

## 2020-09-02 DIAGNOSIS — I959 Hypotension, unspecified: Principal | ICD-10-CM

## 2020-09-02 DIAGNOSIS — Z87891 Personal history of nicotine dependence: Secondary | ICD-10-CM | POA: Diagnosis not present

## 2020-09-02 DIAGNOSIS — G40909 Epilepsy, unspecified, not intractable, without status epilepticus: Secondary | ICD-10-CM | POA: Diagnosis present

## 2020-09-02 DIAGNOSIS — H81391 Other peripheral vertigo, right ear: Secondary | ICD-10-CM | POA: Diagnosis present

## 2020-09-02 DIAGNOSIS — M069 Rheumatoid arthritis, unspecified: Secondary | ICD-10-CM | POA: Diagnosis present

## 2020-09-02 DIAGNOSIS — R6510 Systemic inflammatory response syndrome (SIRS) of non-infectious origin without acute organ dysfunction: Secondary | ICD-10-CM | POA: Diagnosis present

## 2020-09-02 LAB — CBC
HCT: 35.2 % — ABNORMAL LOW (ref 36.0–46.0)
Hemoglobin: 10.8 g/dL — ABNORMAL LOW (ref 12.0–15.0)
MCH: 25.7 pg — ABNORMAL LOW (ref 26.0–34.0)
MCHC: 30.7 g/dL (ref 30.0–36.0)
MCV: 83.6 fL (ref 80.0–100.0)
Platelets: 272 10*3/uL (ref 150–400)
RBC: 4.21 MIL/uL (ref 3.87–5.11)
RDW: 16.4 % — ABNORMAL HIGH (ref 11.5–15.5)
WBC: 15.4 10*3/uL — ABNORMAL HIGH (ref 4.0–10.5)
nRBC: 0 % (ref 0.0–0.2)

## 2020-09-02 LAB — C DIFFICILE QUICK SCREEN W PCR REFLEX
C Diff antigen: NEGATIVE
C Diff interpretation: NOT DETECTED
C Diff toxin: NEGATIVE

## 2020-09-02 LAB — LACTIC ACID, PLASMA: Lactic Acid, Venous: 1 mmol/L (ref 0.5–1.9)

## 2020-09-02 LAB — COMPREHENSIVE METABOLIC PANEL
ALT: 11 U/L (ref 0–44)
AST: 11 U/L — ABNORMAL LOW (ref 15–41)
Albumin: 2.5 g/dL — ABNORMAL LOW (ref 3.5–5.0)
Alkaline Phosphatase: 76 U/L (ref 38–126)
Anion gap: 5 (ref 5–15)
BUN: 11 mg/dL (ref 6–20)
CO2: 20 mmol/L — ABNORMAL LOW (ref 22–32)
Calcium: 8.2 mg/dL — ABNORMAL LOW (ref 8.9–10.3)
Chloride: 111 mmol/L (ref 98–111)
Creatinine, Ser: 0.8 mg/dL (ref 0.44–1.00)
GFR, Estimated: >60 mL/min (ref >60)
Glucose, Bld: 122 mg/dL — ABNORMAL HIGH (ref 70–99)
Potassium: 3.7 mmol/L (ref 3.5–5.1)
Sodium: 136 mmol/L (ref 135–145)
Total Bilirubin: 0.6 mg/dL (ref 0.3–1.2)
Total Protein: 6.7 g/dL (ref 6.5–8.1)

## 2020-09-02 LAB — HIV ANTIBODY (ROUTINE TESTING W REFLEX): HIV Screen 4th Generation wRfx: NONREACTIVE

## 2020-09-02 LAB — URINALYSIS, ROUTINE W REFLEX MICROSCOPIC
Bacteria, UA: NONE SEEN
Bilirubin Urine: NEGATIVE
Glucose, UA: >=500 mg/dL — AB
Hgb urine dipstick: NEGATIVE
Ketones, ur: NEGATIVE mg/dL
Leukocytes,Ua: NEGATIVE
Nitrite: NEGATIVE
Protein, ur: NEGATIVE mg/dL
Specific Gravity, Urine: 1.011 (ref 1.005–1.030)
pH: 7 (ref 5.0–8.0)

## 2020-09-02 LAB — GLUCOSE, CAPILLARY
Glucose-Capillary: 132 mg/dL — ABNORMAL HIGH (ref 70–99)
Glucose-Capillary: 138 mg/dL — ABNORMAL HIGH (ref 70–99)

## 2020-09-02 LAB — HEMOGLOBIN A1C
Hgb A1c MFr Bld: 10.5 % — ABNORMAL HIGH (ref 4.8–5.6)
Mean Plasma Glucose: 254.65 mg/dL

## 2020-09-02 LAB — CBG MONITORING, ED
Glucose-Capillary: 103 mg/dL — ABNORMAL HIGH (ref 70–99)
Glucose-Capillary: 110 mg/dL — ABNORMAL HIGH (ref 70–99)

## 2020-09-02 MED ORDER — NORTRIPTYLINE HCL 25 MG PO CAPS
75.0000 mg | ORAL_CAPSULE | Freq: Every day | ORAL | Status: DC
  Administered 2020-09-02 – 2020-09-03 (×2): 75 mg via ORAL
  Filled 2020-09-02 (×2): qty 3

## 2020-09-02 MED ORDER — SENNOSIDES-DOCUSATE SODIUM 8.6-50 MG PO TABS
2.0000 | ORAL_TABLET | Freq: Every day | ORAL | Status: DC
  Administered 2020-09-02: 2 via ORAL
  Filled 2020-09-02 (×2): qty 2

## 2020-09-02 MED ORDER — ENOXAPARIN SODIUM 60 MG/0.6ML IJ SOSY
0.5000 mg/kg | PREFILLED_SYRINGE | INTRAMUSCULAR | Status: DC
  Administered 2020-09-02 – 2020-09-03 (×2): 50 mg via SUBCUTANEOUS
  Filled 2020-09-02 (×2): qty 0.6

## 2020-09-02 MED ORDER — OXYCODONE HCL 5 MG PO TABS
5.0000 mg | ORAL_TABLET | Freq: Four times a day (QID) | ORAL | Status: DC | PRN
  Administered 2020-09-02: 5 mg via ORAL
  Filled 2020-09-02: qty 1

## 2020-09-02 MED ORDER — SODIUM CHLORIDE 0.9 % IV SOLN
2.0000 g | INTRAVENOUS | Status: DC
  Administered 2020-09-02 – 2020-09-03 (×2): 2 g via INTRAVENOUS
  Filled 2020-09-02: qty 2
  Filled 2020-09-02 (×2): qty 20

## 2020-09-02 MED ORDER — OXYCODONE HCL 5 MG PO TABS
10.0000 mg | ORAL_TABLET | ORAL | Status: DC | PRN
Start: 2020-09-02 — End: 2020-09-03
  Administered 2020-09-02 – 2020-09-03 (×2): 10 mg via ORAL
  Filled 2020-09-02 (×2): qty 2

## 2020-09-02 MED ORDER — SODIUM CHLORIDE 0.9 % IV SOLN: INTRAVENOUS | Status: DC

## 2020-09-02 MED ORDER — POTASSIUM CHLORIDE CRYS ER 20 MEQ PO TBCR
40.0000 meq | EXTENDED_RELEASE_TABLET | Freq: Once | ORAL | Status: AC
  Administered 2020-09-02: 40 meq via ORAL
  Filled 2020-09-02: qty 2

## 2020-09-02 MED ORDER — PROMETHAZINE HCL 25 MG PO TABS: 12.5000 mg | ORAL_TABLET | Freq: Four times a day (QID) | ORAL | Status: DC | PRN

## 2020-09-02 MED ORDER — NALOXONE HCL 0.4 MG/ML IJ SOLN: 0.4000 mg | INTRAMUSCULAR | Status: DC | PRN

## 2020-09-02 NOTE — ED Notes (Signed)
PT at patient bedside.

## 2020-09-02 NOTE — Progress Notes (Signed)
09/02/2020 Patient transfer from the emergency room to 2west. She is alert, oriented to person place, time and situation. Patient right lower abdomen excoriation on under right abdominal fold, bruise on arms. Telemetry was placed and patient and central telemetry was called. Rico Sheehan RN

## 2020-09-02 NOTE — ED Notes (Signed)
Attempted to give report x2. Nurse secretary stated room "was not approved" by charge RN yet (time approved was more than 16 minutes). Nurse secretary refused to transfer my call to the RN taking patient.

## 2020-09-02 NOTE — Progress Notes (Signed)
[facility] Progress Note  Patient: Andrea Wade    ZRA:[id number]  DOA: 09/01/2020     Date of Service: the patient was seen and examined on 09/02/2020  Brief hospital course: Past medical history of Crohn's disease, type II DM, MS, TIA, chronic pain syndrome on multiple psychotropic medications, GERD.  Presents with complaints of abdominal pain and sore throat. Found to have group A strep sore throat. Also has multiple complaints. Currently plan is further work-up.  Subjective: Continues to report abdominal pain.  Continues to report nausea without any vomiting.  Had diarrhea prior to admission but no bowel movement here.  Passing gas.  Minimal oral intake.  Reports headache which is frontal in location and migraine like in nature.  Present since last 4 days.  No focal deficit.  She has chronic pain and therefore unable to lift her hands.  She tells me that she has vertigo when she is moving her head.  This all symptoms have been intermittently present since almost 2 years to 6 months.  No fever no chills.  Assessment and Plan: 1.  Hypotension with the diarrhea SIRS like picture. No sepsis. Orthostatic mildly positive. Diarrhea resolved. Continue with IV fluids.  C. difficile negative.  GI pathogen panel currently pending.  Continue enteric precaution.  2.  Group A strep Was started on broad-spectrum antibiotic but we will change it to Augmentin.  3.  Syncope Peripheral vertigo Reports ongoing vertigo for many months.  Sees a neurologist but has not had any further work-up or treatment for this condition reportedly. Will get vestibular evaluation as well as CT scan. Orthostatics are positive.  Continue with fluids.  Monitor on telemetry.  4.  Hyponatremia Improving with fluids.  Monitor.  5.  MS, lupus, arthritis, chronic pain. Bipolar disorder Patient is on multiple psychotropic medications at home but not on any medication for disease modification. Will continue home  regimen.  6.  History of migraine On Topamax. Monitor.  7.  Abdominal pain. Etiology not clear. CT abdomen clearly negative for any acute abnormality. Currently does not have any diarrhea in the hospital. Will monitor.  Resume nortriptyline.  Scheduled Meds:  aspirin EC  81 mg Oral Daily   baclofen  20 mg Oral TID AC & HS   buPROPion  150 mg Oral q morning   DULoxetine  60 mg Oral Daily   enoxaparin (LOVENOX) injection  0.5 mg/kg Subcutaneous Q24H   insulin aspart  0-15 Units Subcutaneous TID WC   insulin aspart  0-5 Units Subcutaneous QHS   insulin aspart  8 Units Subcutaneous TID WC   insulin glargine  30 Units Subcutaneous QHS   meloxicam  7.5 mg Oral BID   nortriptyline  75 mg Oral QHS   sodium chloride flush  3 mL Intravenous Q12H   topiramate  200 mg Oral BID   Continuous Infusions:  sodium chloride 100 mL/hr at 09/02/20 1839   PRN Meds: acetaminophen **OR** acetaminophen, EPINEPHrine, oxyCODONE, phenol, promethazine  Body mass index is 33.6 kg/m.        DVT Prophylaxis: Subcutaneous Lovenox      Advance goals of care discussion: Pt is Full code.  Family Communication: no family was present at bedside, at the time of interview.   Data Reviewed: I have personally reviewed and interpreted daily labs, tele strips, imaging. Group A strep test positive. Serum creatinine stable. Sodium potassium stable. Leukocytosis improving. Anemia likely dilutional in nature with hemoglobin dropping from 12.1-10.8. Hemoglobin A1c 10.5.  Physical  Exam:  General: Appear in moderate distress, no Rash; Oral Mucosa Clear, moist. no Abnormal Neck Mass Or lumps, Conjunctiva normal  Cardiovascular: S1 and S2 Present, no Murmur, Respiratory: good respiratory effort, Bilateral Air entry present and CTA, no Crackles, no wheezes Abdomen: Bowel Sound present, Soft and diffuse tenderness Extremities: no Pedal edema Neurology: alert and oriented to time, place, and person affect  anxious. no new focal deficit Gait not checked due to patient safety concerns  Vitals:   09/02/20 1445 09/02/20 1500 09/02/20 1615 09/02/20 1703  BP: (!) 104/59 106/65 (!) 103/55 123/75  Pulse: 95 82 80 83  Resp: 15 (!) 24 17   Temp:      TempSrc:      SpO2: 97% 93% 95% 99%  Weight:      Height:        Disposition:  Status is: Inpatient  Remains inpatient appropriate because:Ongoing diagnostic testing needed not appropriate for outpatient work up  Dispo: The patient is from: Home              Anticipated d/c is to: Home              Patient currently is not medically stable to d/c.   Difficult to place patient No  Time spent: 35 minutes. I reviewed all nursing notes, pharmacy notes, vitals, pertinent old records. I have discussed plan of care as described above with RN.  Author: Berle Mull, MD [facility] 09/02/2020 6:43 PM  To reach On-call, see care teams to locate the attending and reach out via [email or web address]. Between 7PM-7AM, please contact night-coverage If you still have difficulty reaching the attending provider, please page the [facility] (Director on Call) for [facility] on amion for assistance.

## 2020-09-02 NOTE — ED Notes (Signed)
Attending provider at bedside

## 2020-09-02 NOTE — ED Notes (Signed)
Patient returned to room from CT.

## 2020-09-02 NOTE — Evaluation (Signed)
Physical Therapy Evaluation Patient Details Name: Andrea Wade MRN: [id number] DOB: 1971-01-07 Today's Date: 09/02/2020   History of Present Illness  50yo female admitted 09/01/20 with abdominal pain and sore throat. Admitted with hypotension, group A strep, and sepsis. PMH chronic back pain, chrohns, DM, epilepsy, MS  Clinical Impression   Patient received in bed, pleasant and cooperative. Today's focus was on vestibular workup- would recommend second person for safety given that she did seem to have pre-syncopal episode during this session just laying in bed (did not lose consciousness, episode lasted for 2-3 seconds and then she tells me she was able to "fight it but sometimes I can't"). Seems to have very complex vestibular workup- had direction changing nystagmus with visual tracking (and tells me this happens to her randomly throughout the day, she cannot control it), has increased dizziness when turning her head right but it is usually immediately alleviated by turning her head left, and also seems to have some signs of possible R sided BPPV. Will continue to follow closely. Has had some falls related to this dizziness in the past. Left in bed with all needs met on ED stretcher, RN/MD aware of PT findings. May benefit from SNF pending progress and further vestibular w/u.     Follow Up Recommendations SNF;Supervision/Assistance - 24 hour;Other (comment) (might be able to progress to HHPT)    Equipment Recommendations  Rolling walker with 5" wheels;3in1 (PT)    Recommendations for Other Services       Precautions / Restrictions Precautions Precautions: Fall;Other (comment) Precaution Comments: BPPV vs central dizziness Restrictions Weight Bearing Restrictions: No      Mobility  Bed Mobility               General bed mobility comments: focus on vestibular w/u    Transfers                 General transfer comment: focus on vestibular w/u  Ambulation/Gait              General Gait Details: focus on vestibular w/u  Stairs            Wheelchair Mobility    Modified Rankin (Stroke Patients Only)       Balance                                             Pertinent Vitals/Pain Pain Assessment: 0-10 Pain Score: 9  Pain Location: low back and R UE Pain Descriptors / Indicators: Stabbing;Aching;Discomfort Pain Intervention(s): Premedicated before session    Home Living Family/patient expects to be discharged to:: Private residence Living Arrangements: Spouse/significant other Available Help at Discharge: Family;Available PRN/intermittently Type of Home: House Home Access: Ramped entrance     Home Layout: One level Home Equipment: Wheelchair - [employer] - 4 wheels;Wheelchair - power;Shower seat;Toilet riser Additional Comments: power chair does not have batteries at the moment, in process of getting more; also in process of getting bars for the shower and changing shower to a step in unit    Prior Function Level of Independence: Needs assistance   Gait / Transfers Assistance Needed: sometimes needs help when walking  ADL's / Homemaking Assistance Needed: needs help wtih cooking/cleaning, needs help bathing too, sometimes needs help getting dressed  Comments: has MS and lupus so feels like she has daily flares- every day is getting a little  more challenging; has 3-5 minutes of energy at a time at baseline. Has had 2 falls in the path 3 months- shoe snags on floor     Hand Dominance   Dominant Hand: Right    Extremity/Trunk Assessment   Upper Extremity Assessment Upper Extremity Assessment: Defer to OT evaluation    Lower Extremity Assessment Lower Extremity Assessment: Generalized weakness    Cervical / Trunk Assessment Cervical / Trunk Assessment: Normal  Communication   Communication: No difficulties  Cognition Arousal/Alertness: Awake/alert Behavior During Therapy: WFL for tasks  assessed/performed;Anxious;Flat affect Overall Cognitive Status: Within Functional Limits for tasks assessed                                        General Comments General comments (skin integrity, edema, etc.): will attempt gait/balance next session- today's focus was on vestibular w/u    Exercises     Assessment/Plan    PT Assessment Patient needs continued PT services  PT Problem List Decreased strength;Decreased knowledge of use of DME;Decreased activity tolerance;Decreased safety awareness;Decreased balance;Decreased mobility;Decreased coordination       PT Treatment Interventions DME instruction;Balance training;Gait training;Stair training;Functional mobility training;Patient/family education;Therapeutic activities;Wheelchair mobility training;Therapeutic exercise    PT Goals (Current goals can be found in the Care Plan section)  Acute Rehab PT Goals Patient Stated Goal: not be dizzy PT Goal Formulation: With patient Time For Goal Achievement: 09/16/20 Potential to Achieve Goals: Fair    Frequency Min 3X/week   Barriers to discharge        Co-evaluation               AM-PAC PT "6 Clicks" Mobility  Outcome Measure Help needed turning from your back to your side while in a flat bed without using bedrails?: A Little Help needed moving from lying on your back to sitting on the side of a flat bed without using bedrails?: A Little Help needed moving to and from a bed to a chair (including a wheelchair)?: A Lot Help needed standing up from a chair using your arms (e.g., wheelchair or bedside chair)?: A Lot Help needed to walk in hospital room?: A Lot Help needed climbing 3-5 steps with a railing? : Total 6 Click Score: 13    End of Session   Activity Tolerance: Patient tolerated treatment well Patient left: in bed;with call bell/phone within reach (ED stretcher) Nurse Communication: Mobility status PT Visit Diagnosis: Unsteadiness on feet  (R26.81);Dizziness and giddiness (R42);Muscle weakness (generalized) (M62.81);History of falling (Z91.81);Difficulty in walking, not elsewhere classified (R26.2)    Time: 9163-8466 PT Time Calculation (min) (ACUTE ONLY): 31 min   Charges:   PT Evaluation $PT Eval High Complexity: 1 High PT Treatments $Neuromuscular Re-education: 8-22 mins       Windell Norfolk, DPT, PN1   Supplemental Physical Therapist [place]    Pager [phone] Acute Rehab Office [phone]

## 2020-09-02 NOTE — Progress Notes (Signed)
PT Cancellation Note  Patient Details Name: Anaiza Behrens MRN: [id number] DOB: 01-16-1971   Cancelled Treatment:    Reason Eval/Treat Not Completed: Patient at procedure or test/unavailable attempted to see patient- not in room/not available for PT. Will attempt to return later if time/schedule allow.   Windell Norfolk, DPT, PN1   Supplemental Physical Therapist [facility]    Pager [phone] Acute Rehab Office [phone]

## 2020-09-03 ENCOUNTER — Inpatient Hospital Stay (HOSPITAL_COMMUNITY): Payer: Medicare Other

## 2020-09-03 DIAGNOSIS — R55 Syncope and collapse: Secondary | ICD-10-CM

## 2020-09-03 LAB — GASTROINTESTINAL PANEL BY PCR, STOOL (REPLACES STOOL CULTURE)

## 2020-09-03 LAB — ECHOCARDIOGRAM COMPLETE
Area-P 1/2: 3.76 cm2
Calc EF: 51.7 %
Height: 68 in
S' Lateral: 4.2 cm
Single Plane A2C EF: 50.7 %
Single Plane A4C EF: 46.8 %
Weight: 3536 oz

## 2020-09-03 LAB — URINE CULTURE: Culture: NO GROWTH

## 2020-09-03 LAB — GLUCOSE, CAPILLARY
Glucose-Capillary: 97 mg/dL (ref 70–99)
Glucose-Capillary: 146 mg/dL — ABNORMAL HIGH (ref 70–99)
Glucose-Capillary: 101 mg/dL — ABNORMAL HIGH (ref 70–99)
Glucose-Capillary: 151 mg/dL — ABNORMAL HIGH (ref 70–99)

## 2020-09-03 MED ORDER — PROMETHAZINE HCL 25 MG PO TABS
12.5000 mg | ORAL_TABLET | Freq: Two times a day (BID) | ORAL | Status: DC
  Administered 2020-09-03 – 2020-09-04 (×3): 12.5 mg via ORAL
  Filled 2020-09-03 (×3): qty 1

## 2020-09-03 MED ORDER — ADULT MULTIVITAMIN W/MINERALS CH
1.0000 | ORAL_TABLET | Freq: Every day | ORAL | Status: DC
  Administered 2020-09-03 – 2020-09-04 (×2): 1 via ORAL
  Filled 2020-09-03 (×2): qty 1

## 2020-09-03 MED ORDER — GLUCERNA SHAKE PO LIQD
237.0000 mL | Freq: Three times a day (TID) | ORAL | Status: DC
  Administered 2020-09-04: 237 mL via ORAL
  Filled 2020-09-03: qty 237

## 2020-09-03 MED ORDER — PROSOURCE PLUS PO LIQD
30.0000 mL | Freq: Two times a day (BID) | ORAL | Status: DC
  Administered 2020-09-04 (×2): 30 mL via ORAL
  Filled 2020-09-03 (×2): qty 30

## 2020-09-03 MED ORDER — DICYCLOMINE HCL 10 MG PO CAPS
10.0000 mg | ORAL_CAPSULE | Freq: Three times a day (TID) | ORAL | Status: DC
  Administered 2020-09-03 – 2020-09-04 (×3): 10 mg via ORAL
  Filled 2020-09-03 (×4): qty 1

## 2020-09-03 MED ORDER — OXYCODONE HCL 5 MG PO TABS
20.0000 mg | ORAL_TABLET | Freq: Four times a day (QID) | ORAL | Status: DC | PRN
  Administered 2020-09-03 – 2020-09-04 (×3): 20 mg via ORAL
  Filled 2020-09-03 (×3): qty 4

## 2020-09-03 NOTE — Progress Notes (Signed)
[facility] Progress Note  Patient: Andrea Wade    IWP:[id number]  DOA: 09/01/2020     Date of Service: the patient was seen and examined on 09/03/2020  Brief hospital course: Past medical history of Crohn's disease, type II DM, MS, TIA, chronic pain syndrome on multiple psychotropic medications, GERD.  Presents with complaints of abdominal pain and sore throat. Found to have group A strep sore throat. Also has multiple complaints. Currently plan is arrange for safe discharge  Subjective: Abdominal pain still present.  No nausea no vomiting.  No diarrhea.  No blood in the stool.  No fever no chills.  Tolerating oral diet.  Assessment and Plan: 1.  Hypotension with the diarrhea Positive STEC SIRS like picture. No sepsis. Orthostatic mildly positive. Diarrhea resolved. Treated with IV fluids.  C. difficile negative.  GI pathogen panel currently positive for STEC.  Discontinue enteric precaution.  No antibiotic needed for this. Monitor on telemetry.  2.  Group A strep Was started on broad-spectrum antibiotic with vancomycin and Zosyn. Currently on IV ceftriaxone. Transition to oral tomorrow. Sore throat is improving.  3.  Syncope Peripheral vertigo Reports ongoing vertigo for many months.  Sees a neurologist but has not had any further work-up or treatment for this condition reportedly. Vestibular evaluation nondiagnostic. CT head unremarkable for any acute stroke or mass or lesion. Carotid Doppler negative for any significant stenosis. Echocardiogram 55 to 60% EF without any significant wall motion abnormality or valvular abnormality. Symptoms gradually improving.  Monitor. Orthostatics are positive.  Treated with fluids.  Monitor on telemetry.  4.  Hyponatremia Improving with fluids.  Monitor.  5.  MS, lupus, arthritis, chronic pain. Bipolar disorder Patient is on multiple psychotropic medications at home but not on any medication for disease modification. Will  continue home regimen.  6.  History of migraine On Topamax. Monitor.  7.  Abdominal pain.  Postinfectious pain. Etiology not clear. CT abdomen clearly negative for any acute abnormality. Currently does not have any diarrhea in the hospital. Will monitor.  Resume nortriptyline.  8.  Chronic pain syndrome. Mood disorder. Patient is on multiple psychotropic medication as well as pain medication. Likely can contribute to patient's dizziness and lightheadedness as well as vertigo. For now we will resume it due to patient's significant burden of symptoms.   Scheduled Meds:  [START ON 09/04/2020] (feeding supplement) PROSource Plus  30 mL Oral BID BM   aspirin EC  81 mg Oral Daily   baclofen  20 mg Oral TID AC & HS   buPROPion  150 mg Oral q morning   dicyclomine  10 mg Oral TID AC   DULoxetine  60 mg Oral Daily   enoxaparin (LOVENOX) injection  0.5 mg/kg Subcutaneous Q24H   feeding supplement (GLUCERNA SHAKE)  237 mL Oral TID BM   insulin aspart  0-15 Units Subcutaneous TID WC   insulin aspart  0-5 Units Subcutaneous QHS   insulin aspart  8 Units Subcutaneous TID WC   insulin glargine  30 Units Subcutaneous QHS   meloxicam  7.5 mg Oral BID   multivitamin with minerals  1 tablet Oral Daily   nortriptyline  75 mg Oral QHS   promethazine  12.5 mg Oral BID   senna-docusate  2 tablet Oral QHS   sodium chloride flush  3 mL Intravenous Q12H   topiramate  200 mg Oral BID   Continuous Infusions:  sodium chloride 100 mL/hr at 09/03/20 1645   cefTRIAXone (ROCEPHIN)  IV 2 g (  09/02/20 2032)   PRN Meds: acetaminophen **OR** acetaminophen, EPINEPHrine, naLOXone (NARCAN)  injection, oxyCODONE, phenol, promethazine  Body mass index is 33.6 kg/m.  Nutrition Problem: Inadequate oral intake Etiology: acute illness     DVT Prophylaxis: Subcutaneous Lovenox      Advance goals of care discussion: Pt is Full code.  Family Communication: no family was present at bedside, at the time of  interview.   Data Reviewed: I have personally reviewed and interpreted daily labs, tele strips, imaging. CBG stable.  Echocardiogram shows preserved EF.  Carotid Doppler negative.  Physical Exam:  General: Appear in mild distress, no Rash; Oral Mucosa Clear, moist. no Abnormal Neck Mass Or lumps, Conjunctiva normal  Cardiovascular: S1 and S2 Present, no Murmur, Respiratory: good respiratory effort, Bilateral Air entry present and CTA, no Crackles, no wheezes Abdomen: Bowel Sound present, Soft and no tenderness Extremities: no Pedal edema Neurology: alert and oriented to time, place, and person affect appropriate. no new focal deficit, no nystagmus.  Reports vertigo on right side. Gait not checked due to patient safety concerns   Vitals:   09/02/20 1703 09/02/20 2133 09/03/20 0557 09/03/20 1635  BP: 123/75 124/74 132/78 135/79  Pulse: 83 80 84 72  Resp:  18 17 17   Temp:  98.1 F (36.7 C) 97.8 F (36.6 C) 98.5 F (36.9 C)  TempSrc:    Oral  SpO2: 99% 100% 96% 99%  Weight:      Height:        Disposition:  Status is: Inpatient  Remains inpatient appropriate because:Ongoing diagnostic testing needed not appropriate for outpatient work up  Dispo: The patient is from: Home              Anticipated d/c is to: Home              Patient currently is not medically stable to d/c.   Difficult to place patient No  Time spent: 35 minutes. I reviewed all nursing notes, pharmacy notes, vitals, pertinent old records. I have discussed plan of care as described above with RN.  Author: Berle Mull, MD [facility] 09/03/2020 6:56 PM  To reach On-call, see care teams to locate the attending and reach out via [email or web address]. Between 7PM-7AM, please contact night-coverage If you still have difficulty reaching the attending provider, please page the [facility] (Director on Call) for [facility] on amion for assistance.

## 2020-09-03 NOTE — Progress Notes (Signed)
  Echocardiogram 2D Echocardiogram has been performed.  Michiel Cowboy 09/03/2020, 3:32 PM

## 2020-09-03 NOTE — Progress Notes (Signed)
Physical Therapy Treatment Patient Details Name: Andrea Wade MRN: [id number] DOB: Oct 20, 1970 Today's Date: 09/03/2020    History of Present Illness 50yo female admitted 09/01/20 with abdominal pain and sore throat. Admitted with hypotension, group A strep, and sepsis. PMH chronic back pain, chrohns, DM, epilepsy, MS    PT Comments    Pt admitted with above diagnosis. Pt was able to tolerate testing without any significant vestibular findings. Pt did have episode with eye movements when neck extended and side flexed but she could not open eyes for PT to see movement and when PT moved neck out of position, pt conversive immediately.  Notified MD of findings.  Pt was able to come to EOB and walk over to chair with use of RW and no c/o dizziness.  Will continue to follow acutely. Pt currently with functional limitations due to balance and endurance deficits. Pt will benefit from skilled PT to increase their independence and safety with mobility to allow discharge to the venue listed below.      Follow Up Recommendations  SNF;Supervision/Assistance - 24 hour;Other (comment) (might be able to progress to HHPT)     Equipment Recommendations  Rolling walker with 5" wheels;3in1 (PT)    Recommendations for Other Services       Precautions / Restrictions Precautions Precautions: Fall;Other (comment) Restrictions Weight Bearing Restrictions: No    Mobility  Bed Mobility Overal bed mobility: Needs Assistance Bed Mobility: Supine to Sit     Supine to sit: Min assist     General bed mobility comments: Tested canals for BPPV and did not see any definitive positive BPPV.  However when laying pt back on left side with head in side flexion and extension, noted pt with episode of unresponsiveness and eyes closed so couldnt see much.  Brought bed to level and moved pts head and she was alert.  Messaged MD to make him aware of findings.    Transfers Overall transfer level: Needs  assistance Equipment used: Rolling walker (2 wheeled) Transfers: Sit to/from Stand Sit to Stand: Min assist         General transfer comment: min assist to power up.  Ambulation/Gait Ambulation/Gait assistance: Min guard Gait Distance (Feet): 35 Feet Assistive device: Rolling walker (2 wheeled) Gait Pattern/deviations: Step-through pattern;Decreased stride length   Gait velocity interpretation: <1.31 ft/sec, indicative of household ambulator General Gait Details: Pt able to walk around bed with RW slow and guarded gait but no LOB and steady with UE support.   Stairs             Wheelchair Mobility    Modified Rankin (Stroke Patients Only)       Balance Overall balance assessment: Needs assistance Sitting-balance support: No upper extremity supported;Feet supported Sitting balance-Leahy Scale: Fair     Standing balance support: Bilateral upper extremity supported;During functional activity Standing balance-Leahy Scale: Poor Standing balance comment: relies on UE support                            Cognition Arousal/Alertness: Awake/alert Behavior During Therapy: WFL for tasks assessed/performed;Anxious;Flat affect Overall Cognitive Status: Within Functional Limits for tasks assessed                                        Exercises      General Comments  Pertinent Vitals/Pain Pain Assessment: Faces Faces Pain Scale: Hurts even more Pain Location: low back Pain Descriptors / Indicators: Stabbing;Aching;Discomfort Pain Intervention(s): Limited activity within patient's tolerance;Monitored during session;Repositioned    Home Living                      Prior Function            PT Goals (current goals can now be found in the care plan section) Progress towards PT goals: Progressing toward goals    Frequency    Min 3X/week      PT Plan Current plan remains appropriate    Co-evaluation               AM-PAC PT "6 Clicks" Mobility   Outcome Measure  Help needed turning from your back to your side while in a flat bed without using bedrails?: A Little Help needed moving from lying on your back to sitting on the side of a flat bed without using bedrails?: A Little Help needed moving to and from a bed to a chair (including a wheelchair)?: A Little Help needed standing up from a chair using your arms (e.g., wheelchair or bedside chair)?: A Little Help needed to walk in hospital room?: A Little Help needed climbing 3-5 steps with a railing? : A Lot 6 Click Score: 17    End of Session Equipment Utilized During Treatment: Gait belt Activity Tolerance: Patient tolerated treatment well Patient left: with call bell/phone within reach;in chair;with chair alarm set Nurse Communication: Mobility status PT Visit Diagnosis: Unsteadiness on feet (R26.81);Dizziness and giddiness (R42);Muscle weakness (generalized) (M62.81);History of falling (Z91.81);Difficulty in walking, not elsewhere classified (R26.2)     Time: 6073-7106 PT Time Calculation (min) (ACUTE ONLY): 29 min  Charges:  $Gait Training: 8-22 mins $Therapeutic Activity: 8-22 mins                     Andrea Wade,PT Acute Rehab Services [phone] (pager)    Andrea Wade 09/03/2020, 12:08 PM

## 2020-09-03 NOTE — Plan of Care (Signed)
  Problem: Pain Managment: Goal: General experience of comfort will improve Outcome: Progressing   Problem: Safety: Goal: Ability to remain free from injury will improve Outcome: Progressing   Problem: Skin Integrity: Goal: Risk for impaired skin integrity will decrease Outcome: Progressing   Problem: Activity: Goal: Risk for activity intolerance will decrease Outcome: Progressing   Problem: Clinical Measurements: Goal: Ability to maintain clinical measurements within normal limits will improve Outcome: Progressing Goal: Will remain free from infection Outcome: Progressing Goal: Diagnostic test results will improve Outcome: Progressing Goal: Respiratory complications will improve Outcome: Progressing Goal: Cardiovascular complication will be avoided Outcome: Progressing

## 2020-09-03 NOTE — Progress Notes (Signed)
Carotid artery duplex has been completed. Preliminary results can be found in CV Proc through chart review.   09/03/20 2:04 PM Andrea Wade RVT

## 2020-09-03 NOTE — Evaluation (Signed)
Occupational Therapy Evaluation Patient Details Name: Andrea Wade MRN: [id number] DOB: [birth year] Today's Date: 09/03/2020    History of Present Illness 50 yo female admitted 09/01/20 with abdominal pain and sore throat. Admitted with hypotension, group A strep, and sepsis. PMH chronic back pain, chrohns, DM, epilepsy, MS   Clinical Impression   PTA patient was living with her spouse in a private residence. Reports assist for LB ADLs and IADLs at baseline from husband but states that she is typically able to complete toilet transfers and toileting/hygiene/clothing management with Mod I. Patient currently functioning near baseline demonstrating observed ADLs including toileting and grooming standing at sink level with supervision A. Patient also limited by deficits listed below including generalized weakness, chronic pain and decreased activity tolerance and would benefit from continued acute OT services in prep for safe d/c home. Patient likely to progress well.     Follow Up Recommendations  No OT follow up    Equipment Recommendations  None recommended by OT (Patient has necessary DME.)    Recommendations for Other Services       Precautions / Restrictions Precautions Precautions: Fall;Other (comment) Restrictions Weight Bearing Restrictions: No      Mobility Bed Mobility Overal bed mobility: Needs Assistance Bed Mobility: Supine to Sit     Supine to sit: Supervision     General bed mobility comments: Supervision A for safety.    Transfers Overall transfer level: Needs assistance Equipment used: Rolling walker (2 wheeled) Transfers: Sit to/from Stand Sit to Stand: Supervision         General transfer comment: Supervision A for safety; cues for hand placement on RW.    Balance Overall balance assessment: Needs assistance Sitting-balance support: No upper extremity supported;Feet supported Sitting balance-Leahy Scale: Good     Standing balance support:  Bilateral upper extremity supported;During functional activity Standing balance-Leahy Scale: Fair Standing balance comment: Reliant on BUE on RW.                           ADL either performed or assessed with clinical judgement   ADL Overall ADL's : Needs assistance/impaired                         Toilet Transfer: Copy Details (indicate cue type and reason): Supervision A for safety wtih use of RW to [facility] over standard height toilet. Toileting- Clothing Manipulation and Hygiene: Supervision/safety;Sit to/from stand Toileting - Clothing Manipulation Details (indicate cue type and reason): Supervision A for safety.     Functional mobility during ADLs: Min guard;Rolling walker General ADL Comments: Patient reports functioning near baseline.     Vision Patient Visual Report: No change from baseline       Perception     Praxis      Pertinent Vitals/Pain Pain Assessment: 0-10 Pain Score: 9  Faces Pain Scale: Hurts even more Pain Location: Chronic pain in BLE, BUE and low back (chronic) Pain Descriptors / Indicators: Stabbing;Aching;Burning Pain Intervention(s): Limited activity within patient's tolerance;Monitored during session;Repositioned     Hand Dominance Right   Extremity/Trunk Assessment Upper Extremity Assessment Upper Extremity Assessment: Generalized weakness (Difficulty reaching overhead bilaterally.)   Lower Extremity Assessment Lower Extremity Assessment: Defer to PT evaluation   Cervical / Trunk Assessment Cervical / Trunk Assessment: Normal   Communication Communication Communication: No difficulties   Cognition Arousal/Alertness: Awake/alert Behavior During Therapy: WFL for tasks assessed/performed;Anxious;Flat affect Overall Cognitive Status: Within Functional  Limits for tasks assessed                                     General Comments  VSS on RA. Mild dizziness reported during  transfer from supine to EOB. Resolved with rest break.    Exercises     Shoulder Instructions      Home Living Family/patient expects to be discharged to:: Private residence Living Arrangements: Spouse/significant other Available Help at Discharge: Family;Available PRN/intermittently Type of Home: House Home Access: Ramped entrance     Home Layout: One level     Bathroom Shower/Tub: Teacher, early years/pre: Standard     Home Equipment: Wheelchair - [employer] - 4 wheels;Wheelchair - power;Shower seat;Toilet riser   Additional Comments: power chair does not have batteries at the moment, in process of getting more; also in process of getting bars for the shower and changing shower to a step in unit      Prior Functioning/Environment Level of Independence: Needs assistance  Gait / Transfers Assistance Needed: Use of RW in home and community dwellings. Wheelchair on "bad days". ADL's / Homemaking Assistance Needed: Assist with ADLs LB ADLs, can complete toileting with Mod I and use of RW.   Comments: Has MS and lupus so feels like she has daily flares- every day is getting a little more challenging; has 3-5 minutes of energy at a time at baseline. Has had 2 falls in the path 3 months- shoe snags on floor        OT Problem List: Decreased strength;Decreased range of motion;Decreased activity tolerance;Impaired balance (sitting and/or standing);Impaired UE functional use      OT Treatment/Interventions: Self-care/ADL training;Therapeutic exercise;Energy conservation;DME and/or AE instruction;Therapeutic activities;Patient/family education;Balance training    OT Goals(Current goals can be found in the care plan section) Acute Rehab OT Goals Patient Stated Goal: To return home. OT Goal Formulation: With patient Time For Goal Achievement: 09/17/20 Potential to Achieve Goals: Good ADL Goals Additional ADL Goal #1: Patient will complete toilet transfers and 3/3  toileting tasks with Mod I and use of RW. Additional ADL Goal #2: Patient will recall 3 strategies to reduce risk of falls in prep for safe d/c home. Additional ADL Goal #3: Patient will tolerate 10-15 minutes of therapeutic activity in standing without need for rest break indicating increased activity tolerance.  OT Frequency: Min 2X/week   Barriers to D/C:            Co-evaluation              AM-PAC OT "6 Clicks" Daily Activity     Outcome Measure Help from another person eating meals?: None Help from another person taking care of personal grooming?: A Little Help from another person toileting, which includes using toliet, bedpan, or urinal?: A Little Help from another person bathing (including washing, rinsing, drying)?: A Little Help from another person to put on and taking off regular upper body clothing?: A Little Help from another person to put on and taking off regular lower body clothing?: A Little 6 Click Score: 19   End of Session Equipment Utilized During Treatment: Gait belt;Rolling walker Nurse Communication: Mobility status  Activity Tolerance: Patient tolerated treatment well Patient left: in bed;with call bell/phone within reach;with bed alarm set  OT Visit Diagnosis: Unsteadiness on feet (R26.81);Repeated falls (R29.6);Muscle weakness (generalized) (M62.81)  Time: 9373-4287 OT Time Calculation (min): 23 min Charges:  OT General Charges $OT Visit: 1 Visit OT Evaluation $OT Eval Low Complexity: 1 Low OT Treatments $Self Care/Home Management : 8-22 mins  Andrea Wade H. OTR/L Supplemental OT, Department of rehab services [phone]  Andrea Wade R H. 09/03/2020, 1:56 PM

## 2020-09-03 NOTE — TOC Initial Note (Addendum)
Transition of Care [facility]) - Initial/Assessment Note    Patient Details  Name: Andrea Wade MRN: [id number] Date of Birth: 1970/11/12  Transition of Care [facility] [place]) CM/SW Contact:    Joanne Chars, LCSW Phone Number: 09/03/2020, 3:40 PM  Clinical Narrative:   CSW met with pt regarding discharge recommendation for SNF.  Pt declines SNF at this time, wants to return home with [facility].  Permission given to speak with husband and daughters.  Choice document given for [facility], has had [facility] in past and would be interested in them again.  Pt reports she does have 24/7 support at home with her husband and daughters.  PCP in place.  Current DME in home: walker, shower chair, bedside commode.  Pt is not vaccinated for covid.                  Expected Discharge Plan: [place] Barriers to Discharge: Continued Medical Work up, Other (must enter comment) (Home health with [place])   Patient Goals and CMS Choice Patient states their goals for this hospitalization and ongoing recovery are:: "make an improvement" CMS Medicare.gov Compare Post Acute Care list provided to:: Patient Choice offered to / list presented to : Patient  Expected Discharge Plan and Services Expected Discharge Plan: [place] Choice: [place] arrangements for the past 2 months: Single Family Home                                      Prior Living Arrangements/Services Living arrangements for the past 2 months: Single Family Home Lives with:: Spouse Patient language and need for interpreter reviewed:: Yes Do you feel safe going back to the place where you live?: Yes      Need for Family Participation in Patient Care: Yes (Comment) Care giver support system in place?: Yes (comment) Current home services: Other (comment) (none) Criminal Activity/Legal Involvement Pertinent to Current Situation/Hospitalization: No - Comment as  needed  Activities of Daily Living Home Assistive Devices/Equipment: Walker (specify type), Shower chair with back, CBG Meter, Bedside commode/3-in-1, Wheelchair (huber round) ADL Screening (condition at time of admission) Patient's cognitive ability adequate to safely complete daily activities?: Yes Is the patient deaf or have difficulty hearing?: No Does the patient have difficulty seeing, even when wearing glasses/contacts?: Yes (does not wear glasses or contact) Does the patient have difficulty concentrating, remembering, or making decisions?: Yes (Since the past few months, not haveing problem right now) Patient able to express need for assistance with ADLs?: Yes Does the patient have difficulty dressing or bathing?: No Independently performs ADLs?: No Communication: Independent Is this a change from baseline?: Pre-admission baseline Dressing (OT): Needs assistance Is this a change from baseline?: Pre-admission baseline Grooming: Independent Feeding: Independent Bathing: Independent (need assist at times) Toileting: Needs assistance Is this a change from baseline?: Pre-admission baseline In/Out Bed: Independent Walks in Home: Needs assistance, Independent with device (comment) Is this a change from baseline?: Pre-admission baseline Does the patient have difficulty walking or climbing stairs?: Yes Weakness of Legs: Both Weakness of Arms/Hands: Both  Permission Sought/Granted Permission sought to share information with : Family Supports Permission granted to share information with : Yes, Verbal Permission Granted  Share Information with NAME: husband Dellis Filbert, daughters [place] and Faith           Emotional  Assessment Appearance:: Appears older than stated age Attitude/Demeanor/Rapport: Engaged Affect (typically observed): Appropriate, Pleasant Orientation: : Oriented to Self, Oriented to Place, Oriented to  Time, Oriented to Situation Alcohol / Substance Use: Not  Applicable Psych Involvement: No (comment)  Admission diagnosis:  Strep pharyngitis [J02.0] Nausea vomiting and diarrhea [R11.2, R19.7] Hypotension [I95.9] Hypotension, unspecified hypotension type [I95.9] Dizziness [R42] Patient Active Problem List   Diagnosis Date Noted   Dizziness 09/02/2020   Group A streptococcal infection 09/01/2020   Diarrhea 09/01/2020   Leukocytosis 09/01/2020   Rheumatoid arthritis 09/01/2020   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   Influenza 04/16/2016   HCAP (healthcare-associated pneumonia) 04/16/2016   Hypotension 04/16/2016   Acute respiratory failure ([place]) 04/16/2016   Sepsis ([place]) 04/15/2016   Pyelonephritis 03/29/2016   Chronic pain syndrome 03/29/2016   Hypochloremia    Hyponatremia    Unspecified osteoarthritis, unspecified site 06/20/2015   Diabetes mellitus due to underlying condition without complications ([place]) 84/53/6468   Anemia, unspecified 10/05/2013   Hypokalemia 10/05/2013   Smoking 10/05/2013   Menorrhagia with regular cycle 10/05/2013   Gastroesophageal reflux disease 10/05/2013   Chest pain 04/17/2013   Seizure ([place]) 02/24/2013   Atypical chest pain 09/22/2012   Multiple sclerosis ([place]) 09/22/2012   Noncompliance 09/22/2012   Tobacco abuse 09/22/2012   DM2 (diabetes mellitus, type 2) ([place]) 09/22/2012   Protein-calorie malnutrition, severe ([place]) 09/22/2012   PCP:  Jeanie Sewer, NP Pharmacy:   [facility], [place] [address] Phone: [phone] Fax: [phone]     Social Determinants of Health (SDOH) Interventions    Readmission Risk Interventions No flowsheet data found.

## 2020-09-03 NOTE — Progress Notes (Signed)
Initial Nutrition Assessment  DOCUMENTATION CODES:  Obesity unspecified  INTERVENTION:  Add Glucerna Shake po TID, each supplement provides 220 kcal and 10 grams of protein.  Add 30 ml ProSource Plus po BID, each supplement provides 100 kcal and 15 grams of protein.    Add MVI with minerals daily.  NUTRITION DIAGNOSIS:  Inadequate oral intake related to acute illness as evidenced by estimated needs.  GOAL:  Patient will meet greater than or equal to 90% of their needs  MONITOR:  PO intake, Supplement acceptance, Labs, Weight trends, I & O's  REASON FOR ASSESSMENT:  Malnutrition Screening Tool    ASSESSMENT:  50 yo female with a PMH of Crohn's disease, T2DM, MS, TIA, chronic pain syndrome on multiple psychotropic medications, and GERD who presents with hypotension, diarrhea (now resolved), and group A strep throat.  Spoke with pt briefly at bedside. She reports eating less the last few weeks because she has not felt good. She seemed a little anxious while talking with RD.  She endorses weight loss, as she reports her clothes have been fitting looser. Per Epic, pt has lost ~7 lbs (3%) in the last 3.5 months, which is not necessarily significant for the time frame.  On exam, pt with no significant depletions.  Given above information, pt is not malnourished at this time. However, she is at risk for malnutrition given poor PO intake for the past few weeks.  Recommend adding Glucerna shakes TID and ProSource Plus BID to promote intake, as well as MVI with minerals daily.  Medications: reviewed; SSI, bedtime Novolog, mealtime Novolog, bedtime Lantus, Phenergan BID, Senokot, ceftriaxone via IV, oxycodone PRN (given twice today)  Labs: reviewed; CBG 97-146 HbA1c: 10.5% (08/2020)  NUTRITION - FOCUSED PHYSICAL EXAM: Flowsheet Row Most Recent Value  Orbital Region Mild depletion  Upper Arm Region No depletion  Thoracic and Lumbar Region No depletion  Buccal Region No depletion   Temple Region No depletion  Clavicle Bone Region No depletion  Clavicle and Acromion Bone Region No depletion  Scapular Bone Region No depletion  Dorsal Hand No depletion  Patellar Region No depletion  Anterior Thigh Region No depletion  Posterior Calf Region No depletion  Edema (RD Assessment) None  Hair Reviewed  Eyes Reviewed  Mouth Reviewed  Skin Reviewed  Nails Reviewed   Diet Order:   Diet Order             Diet Carb Modified Fluid consistency: Thin; Room service appropriate? Yes  Diet effective now                  EDUCATION NEEDS:  Education needs have been addressed  Skin:  Skin Assessment: Reviewed RN Assessment  Last BM:  09/02/20  Height:  Ht Readings from Last 1 Encounters:  09/01/20 5' 8"  (1.727 m)   Weight:  Wt Readings from Last 1 Encounters:  09/01/20 100.2 kg   Ideal Body Weight:  63.6 kg  BMI:  Body mass index is 33.6 kg/m.  Estimated Nutritional Needs:  Kcal:  1850-2050 Protein:  105-120 grams Fluid:  >1.85 L  Derrel Nip, RD, LDN (she/her/hers) Registered Dietitian I After-Hours/Weekend Pager # in [place]

## 2020-09-04 LAB — GLUCOSE, CAPILLARY
Glucose-Capillary: 95 mg/dL (ref 70–99)
Glucose-Capillary: 110 mg/dL — ABNORMAL HIGH (ref 70–99)

## 2020-09-04 MED ORDER — PHENOL 1.4 % MT LIQD: 1.0000 | OROMUCOSAL | 0 refills | Status: DC | PRN

## 2020-09-04 MED ORDER — CEPHALEXIN 500 MG PO CAPS: 500.0000 mg | ORAL_CAPSULE | Freq: Three times a day (TID) | ORAL | 0 refills | Status: AC

## 2020-09-04 MED ORDER — FAMOTIDINE 20 MG PO TABS
20.0000 mg | ORAL_TABLET | Freq: Every day | ORAL | Status: DC
  Administered 2020-09-04: 20 mg via ORAL
  Filled 2020-09-04: qty 1

## 2020-09-04 MED ORDER — DICYCLOMINE HCL 10 MG PO CAPS: 10.0000 mg | ORAL_CAPSULE | Freq: Three times a day (TID) | ORAL | 0 refills | Status: DC | PRN

## 2020-09-04 NOTE — Progress Notes (Signed)
Occupational Therapy Treatment Patient Details Name: Andrea Wade MRN: [id number] DOB: 1971/02/18 Today's Date: 09/04/2020    History of present illness 50 yo female admitted 09/01/20 with abdominal pain and sore throat. Admitted with hypotension, group A strep, and sepsis. PMH chronic back pain, chrohns, DM, epilepsy, MS   OT comments  OT treatment session with focus on household mobility with RW, self-care re-education and education on energy conservation techniques. Completes observed ADLs including grooming standing at sink level and 3/3 parts of toileting task with supervision A for line management. Patient continues to be limited by chronic pain secondary to several chronic conditions and decreased dynamic standing balance. OT will continue to follow acutely to maximize safety/independence with elf-care tasks.     Follow Up Recommendations  No OT follow up    Equipment Recommendations  None recommended by OT (Patient has necessary DME.)    Recommendations for Other Services      Precautions / Restrictions Precautions Precautions: Fall;Other (comment) Restrictions Weight Bearing Restrictions: No       Mobility Bed Mobility Overal bed mobility: Needs Assistance Bed Mobility: Supine to Sit     Supine to sit: Supervision     General bed mobility comments: Supervision A for safety.    Transfers Overall transfer level: Needs assistance Equipment used: Rolling walker (2 wheeled) Transfers: Sit to/from Stand Sit to Stand: Supervision         General transfer comment: Supervision A for safety; cues for hand placement on RW.    Balance Overall balance assessment: Needs assistance Sitting-balance support: No upper extremity supported;Feet supported Sitting balance-Leahy Scale: Good     Standing balance support: Bilateral upper extremity supported;During functional activity Standing balance-Leahy Scale: Fair Standing balance comment: Able to maintain static  standing balance during hand hygiene/without UE support.                           ADL either performed or assessed with clinical judgement   ADL Overall ADL's : Needs assistance/impaired                         Toilet Transfer: Copy Details (indicate cue type and reason): Supervision A for safety wtih use of RW to [facility] over standard height toilet. Toileting- Clothing Manipulation and Hygiene: Supervision/safety;Sit to/from stand Toileting - Clothing Manipulation Details (indicate cue type and reason): Supervision A for safety.     Functional mobility during ADLs: Rolling walker;Supervision/safety General ADL Comments: Patient reports functioning near baseline.     Vision       Perception     Praxis      Cognition Arousal/Alertness: Awake/alert Behavior During Therapy: WFL for tasks assessed/performed;Anxious;Flat affect Overall Cognitive Status: Within Functional Limits for tasks assessed                                          Exercises     Shoulder Instructions       General Comments VSS on RA.    Pertinent Vitals/ Pain       Pain Assessment: 0-10 Pain Score: 9  Pain Location: Chronic pain in BLE, BUE and low back (chronic) Pain Descriptors / Indicators: Stabbing;Aching;Burning Pain Intervention(s): Monitored during session;Patient requesting pain meds-RN notified  Home Living  Prior Functioning/Environment              Frequency  Min 2X/week        Progress Toward Goals  OT Goals(current goals can now be found in the care plan section)  Progress towards OT goals: Progressing toward goals  Acute Rehab OT Goals Patient Stated Goal: To return home. OT Goal Formulation: With patient Time For Goal Achievement: 09/17/20 Potential to Achieve Goals: Good ADL Goals Additional ADL Goal #1: Patient will complete toilet  transfers and 3/3 toileting tasks with Mod I and use of RW. Additional ADL Goal #2: Patient will recall 3 strategies to reduce risk of falls in prep for safe d/c home. Additional ADL Goal #3: Patient will tolerate 10-15 minutes of therapeutic activity in standing without need for rest break indicating increased activity tolerance.  Plan Discharge plan remains appropriate;Frequency remains appropriate    Co-evaluation                 AM-PAC OT "6 Clicks" Daily Activity     Outcome Measure   Help from another person eating meals?: None Help from another person taking care of personal grooming?: A Little Help from another person toileting, which includes using toliet, bedpan, or urinal?: None Help from another person bathing (including washing, rinsing, drying)?: A Little Help from another person to put on and taking off regular upper body clothing?: A Little Help from another person to put on and taking off regular lower body clothing?: A Little 6 Click Score: 20    End of Session Equipment Utilized During Treatment: Gait belt;Rolling walker  OT Visit Diagnosis: Unsteadiness on feet (R26.81);Repeated falls (R29.6);Muscle weakness (generalized) (M62.81)   Activity Tolerance Patient tolerated treatment well   Patient Left in bed;with call bell/phone within reach;with bed alarm set   Nurse Communication Mobility status        Time: 9622-2979 OT Time Calculation (min): 26 min  Charges: OT General Charges $OT Visit: 1 Visit OT Treatments $Self Care/Home Management : 23-37 mins  Dallana Mavity H. OTR/L Supplemental OT, Department of rehab services [phone]   Jhordyn Hoopingarner R H. 09/04/2020, 8:51 AM

## 2020-09-04 NOTE — Plan of Care (Addendum)
Patient discharging to home this evening with home health PT. This RN discussed the patient's DC plan of care with her. Patient denies questions or concerns at this time. Waiting for ride home.   Edit: patient left the unit at 1609 hrs.   Problem: Education: Goal: Knowledge of General Education information will improve Description: Including pain rating scale, medication(s)/side effects and non-pharmacologic comfort measures 09/04/2020 1419 by Jesse Sans, RN Outcome: Adequate for Discharge 09/04/2020 0825 by Jesse Sans, RN Outcome: Progressing   Problem: Health Behavior/Discharge Planning: Goal: Ability to manage health-related needs will improve 09/04/2020 1419 by Jesse Sans, RN Outcome: Adequate for Discharge 09/04/2020 0825 by Jesse Sans, RN Outcome: Progressing   Problem: Clinical Measurements: Goal: Ability to maintain clinical measurements within normal limits will improve 09/04/2020 1419 by Jesse Sans, RN Outcome: Adequate for Discharge 09/04/2020 0825 by Jesse Sans, RN Outcome: Progressing Goal: Will remain free from infection 09/04/2020 1419 by Jesse Sans, RN Outcome: Adequate for Discharge 09/04/2020 0825 by Jesse Sans, RN Outcome: Progressing Goal: Diagnostic test results will improve 09/04/2020 1419 by Jesse Sans, RN Outcome: Adequate for Discharge 09/04/2020 0825 by Jesse Sans, RN Outcome: Progressing Goal: Respiratory complications will improve 09/04/2020 1419 by Jesse Sans, RN Outcome: Adequate for Discharge 09/04/2020 0825 by Jesse Sans, RN Outcome: Progressing Goal: Cardiovascular complication will be avoided 09/04/2020 1419 by Jesse Sans, RN Outcome: Adequate for Discharge 09/04/2020 0825 by Jesse Sans, RN Outcome: Progressing   Problem: Activity: Goal: Risk for activity intolerance will decrease 09/04/2020 1419 by Jesse Sans, RN Outcome: Adequate for Discharge 09/04/2020 0825 by Jesse Sans,  RN Outcome: Progressing   Problem: Nutrition: Goal: Adequate nutrition will be maintained 09/04/2020 1419 by Jesse Sans, RN Outcome: Adequate for Discharge 09/04/2020 0825 by Jesse Sans, RN Outcome: Progressing   Problem: Coping: Goal: Level of anxiety will decrease 09/04/2020 1419 by Jesse Sans, RN Outcome: Adequate for Discharge 09/04/2020 0825 by Jesse Sans, RN Outcome: Progressing   Problem: Elimination: Goal: Will not experience complications related to bowel motility 09/04/2020 1419 by Jesse Sans, RN Outcome: Adequate for Discharge 09/04/2020 0825 by Jesse Sans, RN Outcome: Progressing Goal: Will not experience complications related to urinary retention 09/04/2020 1419 by Jesse Sans, RN Outcome: Adequate for Discharge 09/04/2020 0825 by Jesse Sans, RN Outcome: Progressing   Problem: Pain Managment: Goal: General experience of comfort will improve 09/04/2020 1419 by Jesse Sans, RN Outcome: Adequate for Discharge 09/04/2020 0825 by Jesse Sans, RN Outcome: Progressing   Problem: Safety: Goal: Ability to remain free from injury will improve 09/04/2020 1419 by Jesse Sans, RN Outcome: Adequate for Discharge 09/04/2020 0825 by Jesse Sans, RN Outcome: Progressing   Problem: Skin Integrity: Goal: Risk for impaired skin integrity will decrease 09/04/2020 1419 by Jesse Sans, RN Outcome: Adequate for Discharge 09/04/2020 0825 by Jesse Sans, RN Outcome: Progressing

## 2020-09-04 NOTE — Plan of Care (Addendum)
PT this AM. Reports of chronic pain; scheduled pain meds given as per order. Discharge orders in place; waiting for [facility] per CM before discharging patient.    Problem: Education: Goal: Knowledge of General Education information will improve Description: Including pain rating scale, medication(s)/side effects and non-pharmacologic comfort measures Outcome: Progressing   Problem: Health Behavior/Discharge Planning: Goal: Ability to manage health-related needs will improve Outcome: Progressing   Problem: Clinical Measurements: Goal: Ability to maintain clinical measurements within normal limits will improve Outcome: Progressing Goal: Will remain free from infection Outcome: Progressing Goal: Diagnostic test results will improve Outcome: Progressing Goal: Respiratory complications will improve Outcome: Progressing Goal: Cardiovascular complication will be avoided Outcome: Progressing   Problem: Activity: Goal: Risk for activity intolerance will decrease Outcome: Progressing   Problem: Nutrition: Goal: Adequate nutrition will be maintained Outcome: Progressing   Problem: Coping: Goal: Level of anxiety will decrease Outcome: Progressing   Problem: Elimination: Goal: Will not experience complications related to bowel motility Outcome: Progressing Goal: Will not experience complications related to urinary retention Outcome: Progressing   Problem: Pain Managment: Goal: General experience of comfort will improve Outcome: Progressing   Problem: Safety: Goal: Ability to remain free from injury will improve Outcome: Progressing

## 2020-09-04 NOTE — TOC Transition Note (Signed)
Transition of Care [facility]) - CM/SW Discharge Note   Patient Details  Name: Andrea Wade MRN: [id number] Date of Birth: 03/12/1970  [facility] CM/SW Contact:  Joanne Chars, LCSW Phone Number: 09/04/2020, 1:30 PM   Clinical Narrative:   Pt discharging home with outpt PT follow up.  Pt already had all recommended DME.  CSW made the following efforts to locate [facility] services in [facility]: [facility] unable to accept due to staffing [facility], [facility]: unable to accept, staffing. [facility], Lisa: unable to accept, staffing [facility], Cindi: unable to accept, staffing [facility], Amy: unable to accept, staffing [facility], Stacie: unable to accept, staffing  CSW spoke with pt about the above.  Pt agreeable to outpt PT referral, initially stated she would be able to find a ride, then stated she would have to check on this, agreed to have referral made.  CSW spoke with Colletta Maryland at [facility] in [place] who can accept this referral for outpt PT.  Referral faxed, Colletta Maryland will follow up with pt.     Final next level of care: Home/Self Care Barriers to Discharge: Other (must enter comment) ([place] with [facility] Medicare payer)   Patient Goals and CMS Choice Patient states their goals for this hospitalization and ongoing recovery are:: "make an improvement" CMS Medicare.gov Compare Post Acute Care list provided to:: Patient Choice offered to / list presented to : Patient  Discharge Placement                       Discharge Plan and Services     Post Acute Care Choice: Home Health          DME Arranged: N/A         HH Arranged:  (Unable to find provider for [facility])          Social Determinants of Health (SDOH) Interventions     Readmission Risk Interventions No flowsheet data found.

## 2020-09-06 LAB — CULTURE, BLOOD (ROUTINE X 2)
Culture: NO GROWTH
Culture: NO GROWTH
Special Requests: ADEQUATE

## 2020-09-06 NOTE — Discharge Summary (Signed)
[facility] Discharge Summary   Patient: Andrea Wade VZD:[id number]  PCP: Jeanie Sewer, NP  Date of admission: 09/01/2020   Date of discharge: 09/04/2020     Discharge Diagnoses:  Principal Problem:   Hypotension Active Problems:   Multiple sclerosis ([place])   DM2 (diabetes mellitus, type 2) (HCC)   Seizure ([employer])   Gastroesophageal reflux disease   Hyponatremia   Chronic pain syndrome   Group A streptococcal infection   Diarrhea   Leukocytosis   Rheumatoid arthritis   Dizziness   Admitted From: home Disposition:  Home with home health, pt refused SNF  Recommendations for Outpatient Follow-up:   Follow-up Information     Jeanie Sewer, NP. Schedule an appointment as soon as possible for a visit in 1 week(s).   Specialty: Family Medicine Contact information: [place] [id number] [phone]         [facility] Follow up.   Why: The office will call you to schedule your first physical therapy session. Contact information: [place], [place] [address]   Phone: [phone]               Discharge Instructions     Ambulatory referral to Physical Therapy   Complete by: As directed    Diet - low sodium heart healthy   Complete by: As directed    Discharge instructions   Complete by: As directed    It is important that you read the instructions as well as go over your medication list with RN to help you understand your care after this hospitalization.  Please follow-up with PCP in 1-2 weeks.  Please note that we are unable to authorize any refills for discharge medications, once you are discharged. Thus, it is imperative that you return to your primary care physician (or establish a relationship with a primary care physician if you do not have one) for your care needs. So that they can reassess your need for medications and monitor your lab values.  Please request your primary care  physician to go over all Hospital Tests and Procedure/Radiological results at the follow up. Please get all Hospital records sent to your PCP by signing hospital release before you go home.   Do not drive, operating heavy machinery, perform activities at heights, swimming or participation in water activities or provide baby sitting services; until you have been seen by Primary Care Physician and are cleared to do such activities.  Do not take more than prescribed Pain, Sleep and Anxiety Medications.  You were cared for by a hospitalist during your hospital stay. If you have any questions about your discharge medications or the care you received while you were in the hospital after you are discharged, you can call the hospital unit/floor you were admitted to and ask to speak with the hospitalist who took care of you. Ask for Hospitalist on call, if the hospitalist that took care of you is not available. Once you are discharged, your primary care physician will help you with any further medical issues. You Must read complete instructions/literature along with all the possible adverse reactions/side effects for all the Medicines you take and that have been prescribed to you. Take any new Medicines after you have completely understood and accept all the possible adverse reactions/side effects. If you have smoked or chewed Tobacco, please STOP smoking. If you drink alcohol, please safely STOP the use. Do not drive, operating heavy machinery, perform activities at heights,  swimming or participation in water activities or provide baby sitting services under influence.   Increase activity slowly   Complete by: As directed        Diet recommendation: Cardiac diet  Activity: The patient is advised to gradually reintroduce usual activities, as tolerated  Discharge Condition: stable  Code Status: Full code   History of present illness: As per the H and P dictated on admission, "Andrea Wade is a  50 y.o. female with medical history significant of anemia, chronic pain, diabetes, GERD, seizure, Crohn's disease, RA, MS, TIA who presents with ongoing abdominal pain, sore throat. Patient has had left upper quadrant pain for the past 2 weeks with nausea vomiting and diarrhea.  Diarrhea has now become so frequent that the episodes are too many to count.  She also reports a sore throat for the past 3 to 4 days.  She presented to urgent care initially but was noted to have hypotension and was referred to the ED. Of note her blood pressure has been on the lower side recently for the last 1 month multiple outpatient visits in the 376E systolic.  With 1 visit in the last week with a blood pressure in the 83T to 51V systolic.  She states she has been working with her neurologist to figure out what is going on.  She does report a couple episodes of syncope. She denies fevers, chills, chest pain, shortness of breath, constipation.   ED Course: Vital signs in the ED initially significant for blood pressure in the ED 61Y systolic within normal map this is improved to the 100s with IV fluid.  Vital signs otherwise stable.  Lab work-up showed CMP with sodium 132, bicarb 18, glucose 182, calcium 8.8, albumin 3.0.  CBC showed leukocytosis to 18.7.  PT, PTT, INR within normal limits.  Lipase normal.  Lactic acid 2.2 with repeat pending.  Respiratory panel for flu and COVID-negative.  Creatinine group A strep swab positive.  Urinalysis, urine culture, blood cultures pending.  Chest x-ray with no acute abnormality but did demonstrate low lung volumes.  CT of the abdomen pelvis showed no acute abnormality.  Patient started on broad-spectrum antibiotics with bank, cefepime, Flagyl in the ED.  Also received dose of fentanyl, Zofran.  Was also given 2 L of IV fluids and started on a rate of 150 cc an hour."  Hospital Course:  Summary of her active problems in the hospital is as following.   1.  Hypotension with the  diarrhea Positive ST EColi SIRS like picture. No sepsis. Orthostatic mildly positive. Diarrhea resolved. Treated with IV fluids.  C. difficile negative.  GI pathogen panel currently positive for STEC.  Discontinue enteric precaution.  No antibiotic needed for this.   2.  Group A strep Was started on broad-spectrum antibiotic with vancomycin and Zosyn. Initially on IV ceftriaxone. Now on oral Sore throat is improving.   3.  Syncope Peripheral vertigo Reports ongoing vertigo for many months.  Sees a neurologist but has not had any further work-up or treatment for this condition reportedly. Vestibular evaluation nondiagnostic. CT head unremarkable for any acute stroke or mass or lesion. Carotid Doppler negative for any significant stenosis. Echocardiogram 55 to 60% EF without any significant wall motion abnormality or valvular abnormality. Symptoms gradually improving.  Monitor. Orthostatics are positive.  Treated with fluids.   4.  Hyponatremia Improving with fluids.  Monitor.   5.  MS, lupus, arthritis, chronic pain. Bipolar disorder Patient is on multiple psychotropic medications  at home but not on any medication for disease modification. Will continue home regimen.   6.  History of migraine On Topamax. Monitor.   7.  Abdominal pain.  Postinfectious pain. Etiology not clear. CT abdomen clearly negative for any acute abnormality. Currently does not have any diarrhea in the hospital. Will monitor.  Resume nortriptyline.   8.  Chronic pain syndrome. Mood disorder. Patient is on multiple psychotropic medication as well as pain medication. Likely can contribute to patient's dizziness and lightheadedness as well as vertigo. For now we will resume it due to patient's significant burden of symptoms.  Body mass index is 34.36 kg/m.  Nutrition Problem: Inadequate oral intake Etiology: acute illness Nutrition Interventions: Interventions: Glucerna shake, MVI,  Prostat  Patient was seen by physical therapy, who recommended SNF, pt wanted to go home. On the day of the discharge the patient's vitals were stable, and no other new acute medical condition were reported. The patient was felt safe to be discharge at Home with Home health.  Consultants: none Procedures: Echocardiogram carotid dopplers  DISCHARGE MEDICATION: Allergies as of 09/04/2020       Reactions   Codeine Hives, Itching, Palpitations   Peanut-containing Drug Products Anaphylaxis, Itching   Aspirin Itching   High doses only-baby is ok   Pecan Extract Allergy Skin Test Itching   Penicillins Hives, Itching, Nausea And Vomiting   Allergy from childhood Has patient had a PCN reaction causing immediate rash, facial/tongue/throat swelling, SOB or lightheadedness with hypotension: Yes Has patient had a PCN reaction causing severe rash involving mucus membranes or skin necrosis: Unk Has patient had a PCN reaction that required hospitalization: Unk Has patient had a PCN reaction occurring within the last 10 years: No If all of the above answers are "NO", then may proceed with Cephalosporin use.   Tape Other (See Comments)   Slight irritation Other reaction(s): Other (See Comments) Slight irritation        Medication List     TAKE these medications    aspirin EC 81 MG tablet Take 81 mg by mouth daily. Swallow whole.   baclofen 20 MG tablet Commonly known as: LIORESAL Take 20 mg by mouth 4 (four) times daily.   buPROPion 150 MG 24 hr tablet Commonly known as: WELLBUTRIN XL Take 1 tablet by mouth every morning.   cephALEXin 500 MG capsule Commonly known as: KEFLEX Take 1 capsule (500 mg total) by mouth 3 (three) times daily for 3 days.   dicyclomine 10 MG capsule Commonly known as: BENTYL Take 1 capsule (10 mg total) by mouth 3 (three) times daily as needed for spasms (stomach spasm).   DULoxetine 60 MG capsule Commonly known as: CYMBALTA Take 60 mg by mouth daily.    EPINEPHrine 0.3 mg/0.3 mL Soaj injection Commonly known as: EPI-PEN Inject 0.3 mg into the muscle as needed for anaphylaxis.   insulin aspart 100 UNIT/ML FlexPen Commonly known as: NOVOLOG Inject 16-24 Units into the skin 3 (three) times daily with meals. SLIDING SCALE   lisinopril 5 MG tablet Commonly known as: ZESTRIL Take 1 tablet (5 mg total) by mouth daily.   meloxicam 7.5 MG tablet Commonly known as: MOBIC Take 7.5 mg by mouth 2 (two) times daily.   nortriptyline 75 MG capsule Commonly known as: PAMELOR Take 75 mg by mouth at bedtime.   Oxycodone HCl 20 MG Tabs Take 1 tablet by mouth 4 (four) times daily as needed (pain).   Ozempic (0.25 or 0.5 MG/DOSE) 2 MG/1.5ML Sopn  Generic drug: Semaglutide(0.25 or 0.5MG/DOS) Inject 0.25 mg into the skin once a week. Take on Sunday   pantoprazole 40 MG tablet Commonly known as: PROTONIX Take 40 mg by mouth daily. What changed: Another medication with the same name was removed. Continue taking this medication, and follow the directions you see here.   phenol 1.4 % Liqd Commonly known as: CHLORASEPTIC Use as directed 1 spray in the mouth or throat as needed for throat irritation / pain.   promethazine 12.5 MG tablet Commonly known as: PHENERGAN Take 12.5 mg by mouth 2 (two) times daily.   Synjardy XR 11-998 MG Tb24 Generic drug: Empagliflozin-metFORMIN HCl ER Take 1 tablet by mouth in the morning and at bedtime.   topiramate 200 MG tablet Commonly known as: TOPAMAX Take 200 mg by mouth 2 (two) times daily.   Tyler Aas FlexTouch 100 UNIT/ML FlexTouch Pen Generic drug: insulin degludec Inject 50 Units into the skin at bedtime.        Discharge Exam: Filed Weights   09/01/20 1850 09/04/20 0411  Weight: 100.2 kg 102.5 kg   Vitals:   09/03/20 2110 09/04/20 0416  BP: 136/84 125/79  Pulse: 72 78  Resp: 18 19  Temp: 97.7 F (36.5 C) 98.9 F (37.2 C)  SpO2: 100% 98%   General: Appear in mild distress, no Rash;  Oral Mucosa Clear, moist. no Abnormal Neck Mass Or lumps, Conjunctiva normal  Cardiovascular: S1 and S2 Present, no Murmur, Respiratory: good respiratory effort, Bilateral Air entry present and CTA, no Crackles, no wheezes Abdomen: Bowel Sound present, Soft and no tenderness Extremities: no Pedal edema Neurology: alert and oriented to time, place, and person affect appropriate. no new focal deficit Gait not checked due to patient safety concerns   The results of significant diagnostics from this hospitalization (including imaging, microbiology, ancillary and laboratory) are listed below for reference.    Significant Diagnostic Studies: CT HEAD WO CONTRAST  Result Date: 09/02/2020 CLINICAL DATA:  Vertigo. EXAM: CT HEAD WITHOUT CONTRAST TECHNIQUE: Contiguous axial images were obtained from the base of the skull through the vertex without intravenous contrast. COMPARISON:  January 14, 2020. FINDINGS: Brain: No evidence of acute infarction, hemorrhage, hydrocephalus, extra-axial collection or mass lesion/mass effect. Vascular: No hyperdense vessel or unexpected calcification. Skull: Normal. Negative for fracture or focal lesion. Sinuses/Orbits: Small fluid levels are noted in both maxillary sinuses suggesting possible sinusitis. Other: None. IMPRESSION: Small fluid levels are noted in both maxillary sinuses suggesting sinusitis. No acute intracranial abnormality seen. Electronically Signed   By: Marijo Conception M.D.   On: 09/02/2020 15:36   CT ABDOMEN PELVIS W CONTRAST  Result Date: 09/01/2020 CLINICAL DATA:  Left lower quadrant abdominal pain, nausea, vomiting, and diarrhea for 2 weeks. Sore throat for 4 days. EXAM: CT ABDOMEN AND PELVIS WITH CONTRAST TECHNIQUE: Multidetector CT imaging of the abdomen and pelvis was performed using the standard protocol following bolus administration of intravenous contrast. CONTRAST:  172m OMNIPAQUE IOHEXOL 300 MG/ML  SOLN COMPARISON:  07/26/2019 FINDINGS: Lower  chest: Lung bases are clear. Hepatobiliary: No focal liver abnormality is seen. Status post cholecystectomy. No biliary dilatation. Pancreas: Unremarkable. No pancreatic ductal dilatation or surrounding inflammatory changes. Spleen: Normal in size without focal abnormality. Adrenals/Urinary Tract: Adrenal glands are unremarkable. Kidneys are normal, without renal calculi, focal lesion, or hydronephrosis. Bladder is unremarkable. Stomach/Bowel: Stomach, small bowel, and colon are not abnormally distended. No wall thickening or inflammatory changes are appreciated. Appendix is not identified. Vascular/Lymphatic: No significant vascular findings are present.  No enlarged abdominal or pelvic lymph nodes. Reproductive: Uterus and bilateral adnexa are unremarkable. Surgical clips consistent with tubal ligations. Other: No free air or free fluid in the abdomen. Minimal periumbilical hernia containing fat. Musculoskeletal: No acute or significant osseous findings. IMPRESSION: No acute abnormalities demonstrated in the abdomen or pelvis. No evidence of bowel obstruction or inflammation. Electronically Signed   By: Lucienne Capers M.D.   On: 09/01/2020 20:33   DG Chest Port 1 View  Result Date: 09/01/2020 CLINICAL DATA:  Questionable sepsis - evaluate for abnormality EXAM: PORTABLE CHEST 1 VIEW COMPARISON:  Radiograph 01/14/2020 FINDINGS: Lung volumes are low.The cardiomediastinal contours are normal. The lungs are clear. Pulmonary vasculature is normal. No consolidation, pleural effusion, or pneumothorax. No acute osseous abnormalities are seen. IMPRESSION: Low lung volumes without acute abnormality. Electronically Signed   By: Keith Rake M.D.   On: 09/01/2020 19:43   ECHOCARDIOGRAM COMPLETE  Result Date: 09/03/2020    ECHOCARDIOGRAM REPORT   Patient Name:   YASHA TIBBETT Date of Exam: 09/03/2020 Medical Rec #:  [id number]           Height:       68.0 in Accession #:    [id number]          Weight:       221.0  lb Date of Birth:  02-05-1971            BSA:          2.132 m Patient Age:    22 years            BP:           132/78 mmHg Patient Gender: F                   HR:           78 bpm. Exam Location:  Inpatient Procedure: 2D Echo, Cardiac Doppler and Color Doppler Indications:    Syncope R55  History:        Patient has prior history of Echocardiogram examinations, most                 recent 10/08/2015. Risk Factors:Diabetes and Former Smoker. GERD.  Sonographer:    Vickie Epley RDCS Referring Phys: [id number] [place]  1. Left ventricular ejection fraction, by estimation, is 55 to 60%. The left ventricle has normal function. The left ventricle has no regional wall motion abnormalities. Left ventricular diastolic parameters are indeterminate.  2. Right ventricular systolic function is normal. The right ventricular size is normal. Tricuspid regurgitation signal is inadequate for assessing PA pressure.  3. The mitral valve is normal in structure. No evidence of mitral valve regurgitation.  4. The aortic valve is tricuspid. Aortic valve regurgitation is not visualized. No aortic stenosis is present.  5. The inferior vena cava is normal in size with greater than 50% respiratory variability, suggesting right atrial pressure of 3 mmHg. Comparison(s): A prior study was performed on 10/08/2015. No significant change from prior study. FINDINGS  Left Ventricle: Left ventricular ejection fraction, by estimation, is 55 to 60%. The left ventricle has normal function. The left ventricle has no regional wall motion abnormalities. The left ventricular internal cavity size was normal in size. There is  no left ventricular hypertrophy. Left ventricular diastolic parameters are indeterminate. Right Ventricle: The right ventricular size is normal. No increase in right ventricular wall thickness. Right ventricular systolic function is normal. Tricuspid regurgitation signal is inadequate for assessing  PA pressure. Left Atrium:  Left atrial size was normal in size. Right Atrium: Right atrial size was normal in size. Pericardium: There is no evidence of pericardial effusion. Mitral Valve: The mitral valve is normal in structure. No evidence of mitral valve regurgitation. Tricuspid Valve: The tricuspid valve is normal in structure. Tricuspid valve regurgitation is not demonstrated. Aortic Valve: The aortic valve is tricuspid. Aortic valve regurgitation is not visualized. No aortic stenosis is present. Pulmonic Valve: The pulmonic valve was grossly normal. Pulmonic valve regurgitation is not visualized. No evidence of pulmonic stenosis. Aorta: The aortic root and ascending aorta are structurally normal, with no evidence of dilitation. Venous: The inferior vena cava is normal in size with greater than 50% respiratory variability, suggesting right atrial pressure of 3 mmHg. IAS/Shunts: The atrial septum is grossly normal.  LEFT VENTRICLE PLAX 2D LVIDd:         5.70 cm      Diastology LVIDs:         4.20 cm      LV e' medial:    6.16 cm/s LV PW:         0.80 cm      LV E/e' medial:  9.8 LV IVS:        0.80 cm      LV e' lateral:   7.78 cm/s LVOT diam:     2.30 cm      LV E/e' lateral: 7.8 LV SV:         63 LV SV Index:   29 LVOT Area:     4.15 cm  LV Volumes (MOD) LV vol d, MOD A2C: 137.0 ml LV vol d, MOD A4C: 103.0 ml LV vol s, MOD A2C: 67.5 ml LV vol s, MOD A4C: 54.8 ml LV SV MOD A2C:     69.5 ml LV SV MOD A4C:     103.0 ml LV SV MOD BP:      65.8 ml RIGHT VENTRICLE RV S prime:     10.10 cm/s TAPSE (M-mode): 1.9 cm LEFT ATRIUM             Index       RIGHT ATRIUM           Index LA diam:        3.80 cm 1.78 cm/m  RA Area:     12.80 cm LA Vol (A2C):   33.9 ml 15.90 ml/m RA Volume:   27.50 ml  12.90 ml/m LA Vol (A4C):   37.5 ml 17.59 ml/m LA Biplane Vol: 38.6 ml 18.10 ml/m  AORTIC VALVE LVOT Vmax:   71.00 cm/s LVOT Vmean:  50.100 cm/s LVOT VTI:    0.151 m  AORTA Ao Root diam: 3.40 cm Ao Asc diam:  3.40 cm MITRAL VALVE MV Area (PHT): 3.76  cm    SHUNTS MV Decel Time: 202 msec    Systemic VTI:  0.15 m MV E velocity: 60.30 cm/s  Systemic Diam: 2.30 cm MV A velocity: 76.50 cm/s MV E/A ratio:  0.79 Rudean Haskell MD Electronically signed by Rudean Haskell MD Signature Date/Time: 09/03/2020/5:21:40 PM    Final    VAS US CAROTID  Result Date: 09/03/2020 Carotid Arterial Duplex Study Patient Name:  Anne Hahn  Date of Exam:   09/03/2020 Medical Rec #: [id number]            Accession #:    [id number] Date of Birth: 11-21-1970  Patient Gender: F Patient Age:   35Y Exam Location[facility]  [facility] Procedure:      VAS US CAROTID Referring Phys: [id number] [place] --------------------------------------------------------------------------------  Indications:  Syncope. Risk Factors: Diabetes. Performing Technologist: Oliver Hum RVT  Examination Guidelines: A complete evaluation includes B-mode imaging, spectral Doppler, color Doppler, and power Doppler as needed of all accessible portions of each vessel. Bilateral testing is considered an integral part of a complete examination. Limited examinations for reoccurring indications may be performed as noted.  Right Carotid Findings: +----------+--------+--------+--------+-----------------------+--------+           PSV cm/sEDV cm/sStenosisPlaque Description     Comments +----------+--------+--------+--------+-----------------------+--------+ CCA Prox  94      17              smooth and heterogenous         +----------+--------+--------+--------+-----------------------+--------+ CCA Distal76      17              smooth and heterogenous         +----------+--------+--------+--------+-----------------------+--------+ ICA Prox  74      18                                              +----------+--------+--------+--------+-----------------------+--------+ ICA Distal81      32                                               +----------+--------+--------+--------+-----------------------+--------+ ECA       100     27                                              +----------+--------+--------+--------+-----------------------+--------+ +----------+--------+-------+--------+-------------------+           PSV cm/sEDV cmsDescribeArm Pressure (mmHG) +----------+--------+-------+--------+-------------------+ [id number]                                        +----------+--------+-------+--------+-------------------+ +---------+--------+--+--------+--+---------+ VertebralPSV cm/s44EDV cm/s15Antegrade +---------+--------+--+--------+--+---------+  Left Carotid Findings: +----------+--------+--------+--------+-----------------------+--------+           PSV cm/sEDV cm/sStenosisPlaque Description     Comments +----------+--------+--------+--------+-----------------------+--------+ CCA Prox  117     33              smooth and heterogenous         +----------+--------+--------+--------+-----------------------+--------+ CCA Distal92      25              smooth and heterogenous         +----------+--------+--------+--------+-----------------------+--------+ ICA Prox  75      20                                              +----------+--------+--------+--------+-----------------------+--------+ ICA Distal59      26                                              +----------+--------+--------+--------+-----------------------+--------+  ECA       88      14                                              +----------+--------+--------+--------+-----------------------+--------+ +----------+--------+--------+--------+-------------------+           PSV cm/sEDV cm/sDescribeArm Pressure (mmHG) +----------+--------+--------+--------+-------------------+ [id number]                                         +----------+--------+--------+--------+-------------------+  +---------+--------+--+--------+--+---------+ VertebralPSV cm/s47EDV cm/s18Antegrade +---------+--------+--+--------+--+---------+   Summary: Right Carotid: Velocities in the right ICA are consistent with a 1-39% stenosis. Left Carotid: Velocities in the left ICA are consistent with a 1-39% stenosis. Vertebrals: Bilateral vertebral arteries demonstrate antegrade flow. *See table(s) above for measurements and observations.  Electronically signed by Antony Contras MD on 09/03/2020 at 2:31:30 PM.    Final     Microbiology: Recent Results (from the past 240 hour(s))  Urine culture     Status: None   Collection Time: 09/01/20  6:36 PM   Specimen: In/Out Cath Urine  Result Value Ref Range Status   Specimen Description IN/OUT CATH URINE  Final   Special Requests NONE  Final   Culture   Final    NO GROWTH Performed at [facility], [address], [place], [place] [address]    Report Status 09/03/2020 FINAL  Final  Blood Culture (routine x 2)     Status: None (Preliminary result)   Collection Time: 09/01/20  7:15 PM   Specimen: BLOOD LEFT WRIST  Result Value Ref Range Status   Specimen Description BLOOD LEFT WRIST  Final   Special Requests   Final    BOTTLES DRAWN AEROBIC AND ANAEROBIC Blood Culture adequate volume   Culture   Final    NO GROWTH 4 DAYS Performed at [facility], [place] [address], [place], [place] [address]    Report Status PENDING  Incomplete  Blood Culture (routine x 2)     Status: None (Preliminary result)   Collection Time: 09/01/20  7:30 PM   Specimen: BLOOD RIGHT FOREARM  Result Value Ref Range Status   Specimen Description BLOOD RIGHT FOREARM  Final   Special Requests   Final    BOTTLES DRAWN AEROBIC AND ANAEROBIC Blood Culture results may not be optimal due to an inadequate volume of blood received in culture bottles   Culture   Final    NO GROWTH 4 DAYS Performed at [facility], [place] [address], [place], [place] [address]    Report Status  PENDING  Incomplete  Resp Panel by RT-PCR (Flu A&B, Covid) Nasopharyngeal Swab     Status: None   Collection Time: 09/01/20  7:47 PM   Specimen: Nasopharyngeal Swab; Nasopharyngeal(NP) swabs in vial transport medium  Result Value Ref Range Status   SARS Coronavirus 2 by RT PCR NEGATIVE NEGATIVE Final    Comment: (NOTE) SARS-CoV-2 target nucleic acids are NOT DETECTED.  The SARS-CoV-2 RNA is generally detectable in upper respiratory specimens during the acute phase of infection. The lowest concentration of SARS-CoV-2 viral copies this assay can detect is 138 copies/mL. A negative result does not preclude SARS-Cov-2 infection and should not be used as the sole basis for treatment or other patient management decisions.  A negative result may occur with  improper specimen collection/handling, submission of specimen other than nasopharyngeal swab, presence of viral mutation(s) within the areas targeted by this assay, and inadequate number of viral copies(<138 copies/mL). A negative result must be combined with clinical observations, patient history, and epidemiological information. The expected result is Negative.  Fact Sheet for Patients:  EntrepreneurPulse.com.au  Fact Sheet for Healthcare Providers:  IncredibleEmployment.be  This test is no t yet approved or cleared by the [place] FDA and  has been authorized for detection and/or diagnosis of SARS-CoV-2 by FDA under an Emergency Use Authorization (EUA). This EUA will remain  in effect (meaning this test can be used) for the duration of the COVID-19 declaration under Section 564(b)(1) of the Act, 21 U.S.C.section 360bbb-3(b)(1), unless the authorization is terminated  or revoked sooner.       Influenza A by PCR NEGATIVE NEGATIVE Final   Influenza B by PCR NEGATIVE NEGATIVE Final    Comment: (NOTE) The Xpert Xpress SARS-CoV-2/FLU/RSV plus assay is intended as an aid in the diagnosis of  influenza from Nasopharyngeal swab specimens and should not be used as a sole basis for treatment. Nasal washings and aspirates are unacceptable for Xpert Xpress SARS-CoV-2/FLU/RSV testing.  Fact Sheet for Patients: EntrepreneurPulse.com.au  Fact Sheet for Healthcare Providers: IncredibleEmployment.be  This test is not yet approved or cleared by the [place] FDA and has been authorized for detection and/or diagnosis of SARS-CoV-2 by FDA under an Emergency Use Authorization (EUA). This EUA will remain in effect (meaning this test can be used) for the duration of the COVID-19 declaration under Section 564(b)(1) of the Act, 21 U.S.C. section 360bbb-3(b)(1), unless the authorization is terminated or revoked.  Performed at [facility], [place] [address] [place], [place] [address]   Group A Strep by PCR     Status: Abnormal   Collection Time: 09/01/20  7:47 PM   Specimen: Nasopharyngeal Swab; Sterile Swab  Result Value Ref Range Status   Group A Strep by PCR DETECTED (A) NOT DETECTED Final    Comment: Performed at [facility], [place] [address] [place], [place] [address]  Gastrointestinal Panel by PCR , Stool     Status: Abnormal   Collection Time: 09/02/20  1:17 PM   Specimen: Stool  Result Value Ref Range Status   Campylobacter species NOT DETECTED NOT DETECTED Final   Plesimonas shigelloides NOT DETECTED NOT DETECTED Final   Salmonella species NOT DETECTED NOT DETECTED Final   Yersinia enterocolitica NOT DETECTED NOT DETECTED Final   Vibrio species NOT DETECTED NOT DETECTED Final   Vibrio cholerae NOT DETECTED NOT DETECTED Final   Enteroaggregative E coli (EAEC) NOT DETECTED NOT DETECTED Final   Enterotoxigenic E coli (ETEC) NOT DETECTED NOT DETECTED Final   Shiga like toxin producing E coli (STEC) DETECTED (A) NOT DETECTED Final    Comment: RESULT CALLED TO, READ BACK BY AND VERIFIED WITH: CATHERINE PACE 09/03/20 @ 1424 BY S  BEARD    E. coli O157 NOT DETECTED NOT DETECTED Final   Shigella/Enteroinvasive E coli (EIEC) NOT DETECTED NOT DETECTED Final   Cryptosporidium NOT DETECTED NOT DETECTED Final   Cyclospora cayetanensis NOT DETECTED NOT DETECTED Final   Entamoeba histolytica NOT DETECTED NOT DETECTED Final   Giardia lamblia NOT DETECTED NOT DETECTED Final   Adenovirus F40/41 NOT DETECTED NOT DETECTED Final   Astrovirus NOT DETECTED NOT DETECTED Final   Norovirus GI/GII NOT DETECTED NOT DETECTED Final   Rotavirus A NOT  DETECTED NOT DETECTED Final   Sapovirus (I, II, IV, and V) NOT DETECTED NOT DETECTED Final    Comment: Performed at [facility], [place], [place] [address]  C Difficile Quick Screen w PCR reflex     Status: None   Collection Time: 09/02/20  1:17 PM   Specimen: Stool  Result Value Ref Range Status   C Diff antigen NEGATIVE NEGATIVE Final   C Diff toxin NEGATIVE NEGATIVE Final   C Diff interpretation No C. difficile detected.  Final    Comment: Performed at [facility], [place] [address] [place],  [address]     Labs: CBC: Recent Labs  Lab 09/01/20 1828 09/01/20 1840 09/02/20 0355  WBC 18.7*  --  15.4*  HGB 12.1 13.6 10.8*  HCT 40.5 40.0 35.2*  MCV 85.3  --  83.6  PLT 319  --  953   Basic Metabolic Panel: Recent Labs  Lab 09/01/20 1828 09/01/20 1840 09/02/20 0355  NA 132* 135 136  K 4.2 4.3 3.7  CL 105 108 111  CO2 18*  --  20*  GLUCOSE 182* 187* 122*  BUN 16 16 11   CREATININE 0.99 0.90 0.80  CALCIUM 8.8*  --  8.2*   Liver Function Tests: Recent Labs  Lab 09/01/20 1828 09/02/20 0355  AST 15 11*  ALT 13 11  ALKPHOS 86 76  BILITOT 0.5 0.6  PROT 7.8 6.7  ALBUMIN 3.0* 2.5*   CBG: Recent Labs  Lab 09/03/20 1206 09/03/20 1634 09/03/20 2108 09/04/20 0816 09/04/20 1214  GLUCAP 146* 101* 151* 95 110*    Time spent: 35 minutes  Signed:  Berle Mull  [facility] 09/04/2020

## 2020-09-12 DIAGNOSIS — M329 Systemic lupus erythematosus, unspecified: Secondary | ICD-10-CM | POA: Diagnosis not present

## 2020-09-12 DIAGNOSIS — G894 Chronic pain syndrome: Secondary | ICD-10-CM | POA: Diagnosis not present

## 2020-09-12 DIAGNOSIS — M5416 Radiculopathy, lumbar region: Secondary | ICD-10-CM | POA: Diagnosis not present

## 2020-09-12 DIAGNOSIS — G35 Multiple sclerosis: Secondary | ICD-10-CM | POA: Diagnosis not present

## 2020-09-12 DIAGNOSIS — R519 Headache, unspecified: Secondary | ICD-10-CM | POA: Diagnosis not present

## 2020-09-12 DIAGNOSIS — R4702 Dysphasia: Secondary | ICD-10-CM | POA: Diagnosis not present

## 2020-09-12 DIAGNOSIS — M5136 Other intervertebral disc degeneration, lumbar region: Secondary | ICD-10-CM | POA: Diagnosis not present

## 2020-09-12 DIAGNOSIS — Z1389 Encounter for screening for other disorder: Secondary | ICD-10-CM | POA: Diagnosis not present

## 2020-09-13 DIAGNOSIS — A09 Infectious gastroenteritis and colitis, unspecified: Secondary | ICD-10-CM | POA: Diagnosis not present

## 2020-09-13 DIAGNOSIS — K529 Noninfective gastroenteritis and colitis, unspecified: Secondary | ICD-10-CM | POA: Diagnosis not present

## 2020-09-13 DIAGNOSIS — Z09 Encounter for follow-up examination after completed treatment for conditions other than malignant neoplasm: Secondary | ICD-10-CM | POA: Diagnosis not present

## 2020-09-13 DIAGNOSIS — Z8709 Personal history of other diseases of the respiratory system: Secondary | ICD-10-CM | POA: Diagnosis not present

## 2020-09-25 DIAGNOSIS — E118 Type 2 diabetes mellitus with unspecified complications: Secondary | ICD-10-CM | POA: Diagnosis not present

## 2020-09-25 DIAGNOSIS — Z794 Long term (current) use of insulin: Secondary | ICD-10-CM | POA: Diagnosis not present

## 2020-10-10 DIAGNOSIS — R4702 Dysphasia: Secondary | ICD-10-CM | POA: Diagnosis not present

## 2020-10-10 DIAGNOSIS — Z1389 Encounter for screening for other disorder: Secondary | ICD-10-CM | POA: Diagnosis not present

## 2020-10-10 DIAGNOSIS — G894 Chronic pain syndrome: Secondary | ICD-10-CM | POA: Diagnosis not present

## 2020-10-10 DIAGNOSIS — Z79891 Long term (current) use of opiate analgesic: Secondary | ICD-10-CM | POA: Diagnosis not present

## 2020-10-10 DIAGNOSIS — M5416 Radiculopathy, lumbar region: Secondary | ICD-10-CM | POA: Diagnosis not present

## 2020-10-10 DIAGNOSIS — G35 Multiple sclerosis: Secondary | ICD-10-CM | POA: Diagnosis not present

## 2020-10-10 DIAGNOSIS — M5136 Other intervertebral disc degeneration, lumbar region: Secondary | ICD-10-CM | POA: Diagnosis not present

## 2020-10-10 DIAGNOSIS — R519 Headache, unspecified: Secondary | ICD-10-CM | POA: Diagnosis not present

## 2020-10-26 DIAGNOSIS — Z794 Long term (current) use of insulin: Secondary | ICD-10-CM | POA: Diagnosis not present

## 2020-10-26 DIAGNOSIS — E118 Type 2 diabetes mellitus with unspecified complications: Secondary | ICD-10-CM | POA: Diagnosis not present

## 2020-11-14 DIAGNOSIS — R519 Headache, unspecified: Secondary | ICD-10-CM | POA: Diagnosis not present

## 2020-11-14 DIAGNOSIS — M5136 Other intervertebral disc degeneration, lumbar region: Secondary | ICD-10-CM | POA: Diagnosis not present

## 2020-11-14 DIAGNOSIS — M5416 Radiculopathy, lumbar region: Secondary | ICD-10-CM | POA: Diagnosis not present

## 2020-11-14 DIAGNOSIS — G35 Multiple sclerosis: Secondary | ICD-10-CM | POA: Diagnosis not present

## 2020-11-14 DIAGNOSIS — R4702 Dysphasia: Secondary | ICD-10-CM | POA: Diagnosis not present

## 2020-11-14 DIAGNOSIS — G894 Chronic pain syndrome: Secondary | ICD-10-CM | POA: Diagnosis not present

## 2020-11-14 DIAGNOSIS — Z1389 Encounter for screening for other disorder: Secondary | ICD-10-CM | POA: Diagnosis not present

## 2020-11-15 NOTE — Progress Notes (Signed)
[place]  [address] [place],    [address] [phone]  Clinic Day:  11/22/2020  Referring physician: Jeanie Sewer, NP  This document serves as a record of services personally performed by Hosie Poisson, MD. It was created on their behalf by Curry,Lauren E, a trained medical scribe. The creation of this record is based on the scribe's personal observations and the provider's statements to them.  CHIEF COMPLAINT:  CC:   Iron deficiency anemia  Current Treatment:   Observation    HISTORY OF PRESENT ILLNESS:  Andrea Wade is a 50 y.o. female with a iron deficiency anemia who we began seeing in March 2021 when her anemia did not respond to oral iron supplementation twice daily for 6 months.  Her iron deficiency was felt to be due to heavy menses. The patient reported a history of anemia for the past 20 years. She had a colonoscopy in March 2016 with Dr. Lyndel Safe, due to rectal bleeding, anemia and diarrhea.  This revealed internal hemorrhoids, felt to be the cause of the rectal bleeding, with very minimal sigmoid diverticulosis, otherwise normal. Further evaluation of the anemia  did not reveal any evidence of hemolysis, monoclonal gammopathy or other nutritional deficiency contributing to her anemia.  Her B12 was low normal at 278, but a methylmalonic acid could not be added at that time.  Hemoglobin electrophoresis was normal.  Rheumatoid factor and ANA were positive.  The patient was instructed to bring back stool Hemoccult cards, but did not do so.  Oral iron was discontinued due to poor absorption.  She was given IV iron in the form of Feraheme in April 2021.  She also has a history of stroke x2 with associated dysphagia and memory difficulties, seizures, rheumatoid arthritis, polyneuropathy, diabetes and depression and anxiety.  INTERVAL HISTORY:  Andrea Wade is here for routine follow up and does report episodes of hematuria. She does  not feel this is vaginal bleeding. I will order UA and culture today. In the meantime, I will place her on antibiotics with Cipro 500 mg BID for 10 days. She had an E.coli colitis back in the spring, and has had chills since that time. She does report pain of the lower back, bilateral flanks, left upper quadrant and legs. She comes into the clinic in the wheelchair. White count is elevated at 11.6, hemoglobin has decreased from 12.5 to 11.8, and platelet count has increased from 268,000 to 475,000. Chemistries are unremarkable except for a total protein of 9.0, improved. Her  appetite is fair, and she has gained 4 pounds since her last visit.  She denies fever.  She denies nausea, vomiting, or bowel issues.  She denies sore throat, cough, dyspnea, or chest pain.  REVIEW OF SYSTEMS:  Review of Systems  Constitutional:  Positive for chills. Negative for appetite change, fatigue, fever and unexpected weight change.  HENT:  Negative.    Eyes: Negative.   Respiratory: Negative.  Negative for chest tightness, cough, hemoptysis, shortness of breath and wheezing.   Cardiovascular: Negative.  Negative for chest pain, leg swelling and palpitations.  Gastrointestinal:  Positive for abdominal pain (left upper quadrant). Negative for abdominal distention, blood in stool, constipation, diarrhea, nausea and vomiting.  Endocrine: Negative.   Genitourinary:  Positive for hematuria. Negative for difficulty urinating, dysuria and frequency.   Musculoskeletal:  Positive for flank pain (bilateral). Negative for arthralgias, back pain, gait problem and myalgias.  Skin: Negative.   Neurological: Negative.  Negative for  dizziness, extremity weakness, gait problem, headaches, light-headedness, numbness, seizures and speech difficulty.  Hematological: Negative.   Psychiatric/Behavioral:  Negative for depression and sleep disturbance. The patient is nervous/anxious (with panic attacks).     VITALS:  Blood pressure 116/68,  pulse 96, temperature 98.5 F (36.9 C), temperature source Oral, height 5' 8"  (1.727 m), weight 218 lb 3.2 oz (99 kg), SpO2 99 %.  Wt Readings from Last 3 Encounters:  11/22/20 218 lb 3.2 oz (99 kg)  09/04/20 225 lb 15.5 oz (102.5 kg)  08/20/20 221 lb 11.2 oz (100.6 kg)    Body mass index is 33.18 kg/m.  Performance status (ECOG): 2 - Symptomatic, <50% confined to bed  PHYSICAL EXAM:  Physical Exam Constitutional:      General: She is not in acute distress.    Appearance: Normal appearance. She is normal weight.  HENT:     Head: Normocephalic and atraumatic.  Eyes:     General: No scleral icterus.    Extraocular Movements: Extraocular movements intact.     Conjunctiva/sclera: Conjunctivae normal.     Pupils: Pupils are equal, round, and reactive to light.  Cardiovascular:     Rate and Rhythm: Normal rate and regular rhythm.     Pulses: Normal pulses.     Heart sounds: Normal heart sounds. No murmur heard.   No friction rub. No gallop.  Pulmonary:     Effort: Pulmonary effort is normal. No respiratory distress.     Breath sounds: Normal breath sounds.  Abdominal:     General: Bowel sounds are normal. There is no distension.     Palpations: Abdomen is soft. There is no hepatomegaly, splenomegaly or mass.     Tenderness: There is abdominal tenderness (of the bilateral flanks) in the left upper quadrant.  Musculoskeletal:        General: Normal range of motion.     Cervical back: Normal range of motion and neck supple.     Right lower leg: No edema.     Left lower leg: No edema.  Lymphadenopathy:     Cervical: Cervical adenopathy (right cervical, 1 cm) present.  Skin:    General: Skin is warm and dry.  Neurological:     General: No focal deficit present.     Mental Status: She is alert and oriented to person, place, and time. Mental status is at baseline.  Psychiatric:        Mood and Affect: Mood normal.        Behavior: Behavior normal.        Thought Content: Thought  content normal.        Judgment: Judgment normal.   LABS:   CBC Latest Ref Rng & Units 11/22/2020 09/02/2020 09/01/2020  WBC - 11.6 15.4(H) -  Hemoglobin 12.0 - 16.0 11.8(A) 10.8(L) 13.6  Hematocrit 36 - 46 38 35.2(L) 40.0  Platelets 150 - 399 475(A) 272 -   CMP Latest Ref Rng & Units 11/22/2020 09/02/2020 09/01/2020  Glucose 70 - 99 mg/dL - 122(H) 187(H)  BUN 4 - 21 17 11 16   Creatinine 0.5 - 1.1 0.9 0.80 0.90  Sodium 137 - 147 139 136 135  Potassium 3.4 - 5.3 4.1 3.7 4.3  Chloride 99 - 108 106 111 108  CO2 13 - 22 18 20(L) -  Calcium 8.7 - 10.7 9.8 8.2(L) -  Total Protein 6.5 - 8.1 g/dL - 6.7 -  Total Bilirubin 0.3 - 1.2 mg/dL - 0.6 -  Alkaline Phos 25 -  125 134(A) 76 -  AST 13 - 35 22 11(L) -  ALT 7 - 35 16 11 -    Lab Results  Component Value Date   TIBC 286 08/20/2020   TIBC 292 05/20/2020   TIBC 347 10/05/2013   FERRITIN 48 08/20/2020   FERRITIN 53 05/20/2020   FERRITIN 9 (L) 10/05/2013   IRONPCTSAT 16 08/20/2020   IRONPCTSAT 13 05/20/2020   IRONPCTSAT 7 (L) 10/05/2013   No results found for: LDH  STUDIES:  No results found.    HISTORY:   Allergies:  Allergies  Allergen Reactions   Codeine Hives, Itching and Palpitations   Peanut-Containing Drug Products Anaphylaxis and Itching   Aspirin Itching    High doses only-baby is ok   Pecan Extract Allergy Skin Test Itching   Penicillins Hives, Itching and Nausea And Vomiting    Allergy from childhood Has patient had a PCN reaction causing immediate rash, facial/tongue/throat swelling, SOB or lightheadedness with hypotension: Yes Has patient had a PCN reaction causing severe rash involving mucus membranes or skin necrosis: Unk Has patient had a PCN reaction that required hospitalization: Unk Has patient had a PCN reaction occurring within the last 10 years: No If all of the above answers are "NO", then may proceed with Cephalosporin use.     Tape Other (See Comments)    Slight irritation Other reaction(s): Other  (See Comments) Slight irritation    Current Medications: Current Outpatient Medications  Medication Sig Dispense Refill   aspirin EC 81 MG tablet Take 81 mg by mouth daily. Swallow whole.     baclofen (LIORESAL) 20 MG tablet Take 20 mg by mouth 4 (four) times daily.     buPROPion (WELLBUTRIN XL) 150 MG 24 hr tablet Take 1 tablet by mouth every morning.     dicyclomine (BENTYL) 10 MG capsule Take 1 capsule (10 mg total) by mouth 3 (three) times daily as needed for spasms (stomach spasm). 15 capsule 0   DULoxetine (CYMBALTA) 60 MG capsule Take 60 mg by mouth daily.     EPINEPHrine 0.3 mg/0.3 mL IJ SOAJ injection Inject 0.3 mg into the muscle as needed for anaphylaxis.     insulin aspart (NOVOLOG) 100 UNIT/ML FlexPen Inject 16-24 Units into the skin 3 (three) times daily with meals. SLIDING SCALE     lisinopril (PRINIVIL,ZESTRIL) 5 MG tablet Take 1 tablet (5 mg total) by mouth daily. 30 tablet 3   meloxicam (MOBIC) 7.5 MG tablet Take 7.5 mg by mouth 2 (two) times daily.     nortriptyline (PAMELOR) 75 MG capsule Take 75 mg by mouth at bedtime.     Oxycodone HCl 20 MG TABS Take 1 tablet by mouth 4 (four) times daily as needed (pain).     pantoprazole (PROTONIX) 40 MG tablet Take 40 mg by mouth daily.     phenol (CHLORASEPTIC) 1.4 % LIQD Use as directed 1 spray in the mouth or throat as needed for throat irritation / pain. 177 mL 0   promethazine (PHENERGAN) 12.5 MG tablet Take 12.5 mg by mouth 2 (two) times daily.     Semaglutide,0.25 or 0.5MG/DOS, (OZEMPIC, 0.25 OR 0.5 MG/DOSE,) 2 MG/1.5ML SOPN Inject 0.25 mg into the skin once a week. Take on Sunday     SYNJARDY XR 11-998 MG TB24 Take 1 tablet by mouth in the morning and at bedtime.     topiramate (TOPAMAX) 200 MG tablet Take 200 mg by mouth 2 (two) times daily.     TRESIBA FLEXTOUCH  100 UNIT/ML FlexTouch Pen Inject 50 Units into the skin at bedtime.     Rimegepant Sulfate 75 MG TBDP Take 1 tablet every other day for headache prevention  (Patient not taking: Reported on 11/22/2020)     No current facility-administered medications for this visit.    ASSESSMENT & PLAN:   Assessment:  1. Microcytic hypochromic anemia, secondary to iron deficiency.  She last received IV iron in April 2021.   2. 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 No splenomegaly noted today, but she does have left upper quadrant tenderness.   3. Severe rheumatoid arthritis.    4. 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  5. History of strokes.  6. Hematuria with associated bilateral flank tenderness. I will order UA and culture today, but will go ahead and place her on antibiotics with Cipro 500 mg BID for 10 days due to the high likelihood of UTI.  Plan:    For her probable UTI/kidney infection, I will order UA and culture. I will send in Cipro 500 mg BID for 10 days. Otherwise, we will see her back in clinic in 5 months with CBC, CMP and iron studies for repeat evaluation. She may require IV iron again next year. She will continue to follow with PCP and Rheumatology. She verbalizes understanding of and agreement to the plans discussed today. She knows to call the office should any new questions or concerns arise.   I provided 15 minutes of face-to-face time during this this encounter and > 50% was spent counseling as documented under my assessment and plan.   I, Rita Ohara, am acting as scribe for Derwood Kaplan, MD  I have reviewed this report as typed by the medical scribe, and it is complete and accurate.

## 2020-11-21 ENCOUNTER — Other Ambulatory Visit: Payer: Self-pay | Admitting: Oncology

## 2020-11-21 DIAGNOSIS — D509 Iron deficiency anemia, unspecified: Secondary | ICD-10-CM

## 2020-11-22 ENCOUNTER — Other Ambulatory Visit: Payer: Self-pay | Admitting: Hematology and Oncology

## 2020-11-22 ENCOUNTER — Inpatient Hospital Stay: Payer: Medicare Other | Attending: Oncology | Admitting: Oncology

## 2020-11-22 ENCOUNTER — Encounter: Payer: Self-pay | Admitting: Oncology

## 2020-11-22 ENCOUNTER — Other Ambulatory Visit: Payer: Self-pay | Admitting: Oncology

## 2020-11-22 ENCOUNTER — Telehealth: Payer: Self-pay | Admitting: Oncology

## 2020-11-22 ENCOUNTER — Telehealth: Payer: Self-pay | Admitting: Hematology and Oncology

## 2020-11-22 ENCOUNTER — Inpatient Hospital Stay: Payer: Medicare Other

## 2020-11-22 VITALS — BP 116/68 | HR 96 | Temp 98.5°F | Ht 68.0 in | Wt 218.2 lb

## 2020-11-22 DIAGNOSIS — R10812 Left upper quadrant abdominal tenderness: Secondary | ICD-10-CM | POA: Diagnosis not present

## 2020-11-22 DIAGNOSIS — D500 Iron deficiency anemia secondary to blood loss (chronic): Secondary | ICD-10-CM | POA: Diagnosis not present

## 2020-11-22 DIAGNOSIS — G629 Polyneuropathy, unspecified: Secondary | ICD-10-CM | POA: Diagnosis not present

## 2020-11-22 DIAGNOSIS — D509 Iron deficiency anemia, unspecified: Secondary | ICD-10-CM | POA: Insufficient documentation

## 2020-11-22 DIAGNOSIS — M069 Rheumatoid arthritis, unspecified: Secondary | ICD-10-CM | POA: Insufficient documentation

## 2020-11-22 DIAGNOSIS — Z8673 Personal history of transient ischemic attack (TIA), and cerebral infarction without residual deficits: Secondary | ICD-10-CM | POA: Diagnosis not present

## 2020-11-22 DIAGNOSIS — R319 Hematuria, unspecified: Secondary | ICD-10-CM | POA: Diagnosis not present

## 2020-11-22 DIAGNOSIS — R300 Dysuria: Secondary | ICD-10-CM | POA: Diagnosis not present

## 2020-11-22 DIAGNOSIS — D649 Anemia, unspecified: Secondary | ICD-10-CM | POA: Diagnosis not present

## 2020-11-22 DIAGNOSIS — R310 Gross hematuria: Secondary | ICD-10-CM

## 2020-11-22 LAB — IRON AND TIBC
Iron: 23 ug/dL — ABNORMAL LOW (ref 28–170)
Saturation Ratios: 8 % — ABNORMAL LOW (ref 10.4–31.8)
TIBC: 287 ug/dL (ref 250–450)
UIBC: 264 ug/dL

## 2020-11-22 LAB — CBC AND DIFFERENTIAL
HCT: 38 (ref 36–46)
Hemoglobin: 11.8 — AB (ref 12.0–16.0)
Neutrophils Absolute: 8
Platelets: 475 — AB (ref 150–399)
WBC: 11.6

## 2020-11-22 LAB — HEPATIC FUNCTION PANEL
ALT: 16 (ref 7–35)
AST: 22 (ref 13–35)
Alkaline Phosphatase: 134 — AB (ref 25–125)
Bilirubin, Total: 0.2

## 2020-11-22 LAB — COMPREHENSIVE METABOLIC PANEL
Albumin: 3.9 (ref 3.5–5.0)
Calcium: 9.8 (ref 8.7–10.7)

## 2020-11-22 LAB — BASIC METABOLIC PANEL
BUN: 17 (ref 4–21)
CO2: 18 (ref 13–22)
Chloride: 106 (ref 99–108)
Creatinine: 0.9 (ref 0.5–1.1)
Glucose: 123
Potassium: 4.1 (ref 3.4–5.3)
Sodium: 139 (ref 137–147)

## 2020-11-22 LAB — CBC
MCV: 77 — AB (ref 81–99)
RBC: 4.91 (ref 3.87–5.11)

## 2020-11-22 LAB — FERRITIN: Ferritin: 48 ng/mL (ref 11–307)

## 2020-11-22 NOTE — Telephone Encounter (Signed)
Per 9/23 LOS, patient scheduled for Feb 2023 Appt's.  Gave patient Appt [employer]

## 2020-11-26 DIAGNOSIS — E118 Type 2 diabetes mellitus with unspecified complications: Secondary | ICD-10-CM | POA: Diagnosis not present

## 2020-11-26 DIAGNOSIS — Z794 Long term (current) use of insulin: Secondary | ICD-10-CM | POA: Diagnosis not present

## 2020-11-27 ENCOUNTER — Telehealth: Payer: Self-pay

## 2020-11-27 NOTE — Telephone Encounter (Signed)
-----   Message from Derwood Kaplan, MD sent at 11/25/2020  9:39 AM EDT ----- Regarding: call pt Tell her urine looks infected but cult was neg.  I rec she complete the course of ab's if improving

## 2020-11-28 ENCOUNTER — Other Ambulatory Visit: Payer: Self-pay | Admitting: Oncology

## 2020-11-28 ENCOUNTER — Telehealth: Payer: Self-pay

## 2020-11-28 DIAGNOSIS — N3091 Cystitis, unspecified with hematuria: Secondary | ICD-10-CM

## 2020-11-28 MED ORDER — CIPROFLOXACIN HCL 500 MG PO TABS: 500.0000 mg | ORAL_TABLET | Freq: Two times a day (BID) | ORAL | 0 refills | Status: AC

## 2020-11-28 NOTE — Telephone Encounter (Signed)
-----   Message from Derwood Kaplan, MD sent at 11/25/2020  9:39 AM EDT ----- Regarding: call pt Tell her urine looks infected but cult was neg.  I rec she complete the course of ab's if improving

## 2020-11-28 NOTE — Telephone Encounter (Signed)
Patient states she is not on an antibiotic and she is still having symptoms. She wanted to know if you was going to call in an antibiotic?

## 2020-12-04 DIAGNOSIS — R569 Unspecified convulsions: Secondary | ICD-10-CM | POA: Diagnosis not present

## 2020-12-13 ENCOUNTER — Encounter: Payer: Self-pay | Admitting: Oncology

## 2020-12-16 DIAGNOSIS — I10 Essential (primary) hypertension: Secondary | ICD-10-CM | POA: Diagnosis not present

## 2020-12-16 DIAGNOSIS — E785 Hyperlipidemia, unspecified: Secondary | ICD-10-CM | POA: Diagnosis not present

## 2020-12-16 DIAGNOSIS — E119 Type 2 diabetes mellitus without complications: Secondary | ICD-10-CM | POA: Diagnosis not present

## 2020-12-19 DIAGNOSIS — K219 Gastro-esophageal reflux disease without esophagitis: Secondary | ICD-10-CM | POA: Diagnosis not present

## 2020-12-19 DIAGNOSIS — E785 Hyperlipidemia, unspecified: Secondary | ICD-10-CM | POA: Diagnosis not present

## 2020-12-19 DIAGNOSIS — Z2821 Immunization not carried out because of patient refusal: Secondary | ICD-10-CM | POA: Diagnosis not present

## 2020-12-19 DIAGNOSIS — Z79899 Other long term (current) drug therapy: Secondary | ICD-10-CM | POA: Diagnosis not present

## 2020-12-19 DIAGNOSIS — M069 Rheumatoid arthritis, unspecified: Secondary | ICD-10-CM | POA: Diagnosis not present

## 2020-12-19 DIAGNOSIS — Z1231 Encounter for screening mammogram for malignant neoplasm of breast: Secondary | ICD-10-CM | POA: Diagnosis not present

## 2020-12-19 DIAGNOSIS — E1169 Type 2 diabetes mellitus with other specified complication: Secondary | ICD-10-CM | POA: Diagnosis not present

## 2020-12-19 DIAGNOSIS — I10 Essential (primary) hypertension: Secondary | ICD-10-CM | POA: Diagnosis not present

## 2020-12-20 DIAGNOSIS — M329 Systemic lupus erythematosus, unspecified: Secondary | ICD-10-CM | POA: Diagnosis not present

## 2020-12-20 DIAGNOSIS — G35 Multiple sclerosis: Secondary | ICD-10-CM | POA: Diagnosis not present

## 2020-12-20 DIAGNOSIS — Z794 Long term (current) use of insulin: Secondary | ICD-10-CM | POA: Diagnosis not present

## 2020-12-20 DIAGNOSIS — R4702 Dysphasia: Secondary | ICD-10-CM | POA: Diagnosis not present

## 2020-12-20 DIAGNOSIS — R519 Headache, unspecified: Secondary | ICD-10-CM | POA: Diagnosis not present

## 2020-12-20 DIAGNOSIS — M5136 Other intervertebral disc degeneration, lumbar region: Secondary | ICD-10-CM | POA: Diagnosis not present

## 2020-12-20 DIAGNOSIS — M5416 Radiculopathy, lumbar region: Secondary | ICD-10-CM | POA: Diagnosis not present

## 2020-12-20 DIAGNOSIS — G894 Chronic pain syndrome: Secondary | ICD-10-CM | POA: Diagnosis not present

## 2020-12-20 DIAGNOSIS — Z1389 Encounter for screening for other disorder: Secondary | ICD-10-CM | POA: Diagnosis not present

## 2020-12-20 DIAGNOSIS — E118 Type 2 diabetes mellitus with unspecified complications: Secondary | ICD-10-CM | POA: Diagnosis not present

## 2020-12-20 DIAGNOSIS — Z79891 Long term (current) use of opiate analgesic: Secondary | ICD-10-CM | POA: Diagnosis not present

## 2021-04-21 ENCOUNTER — Other Ambulatory Visit: Payer: Self-pay

## 2021-04-24 ENCOUNTER — Ambulatory Visit: Payer: Medicare Other | Admitting: Hematology and Oncology

## 2021-04-24 ENCOUNTER — Other Ambulatory Visit: Payer: Medicare Other

## 2021-05-01 ENCOUNTER — Other Ambulatory Visit: Payer: Self-pay

## 2021-05-02 ENCOUNTER — Inpatient Hospital Stay: Payer: Medicare Other | Admitting: Hematology and Oncology

## 2021-05-02 ENCOUNTER — Ambulatory Visit: Payer: Medicare Other | Admitting: Hematology and Oncology

## 2021-05-02 ENCOUNTER — Other Ambulatory Visit: Payer: Self-pay

## 2021-05-02 ENCOUNTER — Telehealth: Payer: Self-pay | Admitting: Hematology and Oncology

## 2021-05-02 ENCOUNTER — Encounter: Payer: Self-pay | Admitting: Hematology and Oncology

## 2021-05-02 ENCOUNTER — Inpatient Hospital Stay: Payer: Medicare Other | Attending: Hematology and Oncology

## 2021-05-02 DIAGNOSIS — M069 Rheumatoid arthritis, unspecified: Secondary | ICD-10-CM | POA: Diagnosis not present

## 2021-05-02 DIAGNOSIS — D500 Iron deficiency anemia secondary to blood loss (chronic): Secondary | ICD-10-CM | POA: Diagnosis present

## 2021-05-02 LAB — IRON AND TIBC
Iron: 52 ug/dL (ref 28–170)
Saturation Ratios: 18 % (ref 10.4–31.8)
TIBC: 294 ug/dL (ref 250–450)
UIBC: 242 ug/dL

## 2021-05-02 LAB — COMPREHENSIVE METABOLIC PANEL
Albumin: 3.9 (ref 3.5–5.0)
Calcium: 9.2 (ref 8.7–10.7)

## 2021-05-02 LAB — CBC AND DIFFERENTIAL
HCT: 37 (ref 36–46)
Hemoglobin: 11.9 — AB (ref 12.0–16.0)
Neutrophils Absolute: 3.15
Platelets: 403 — AB (ref 150–399)
WBC: 7

## 2021-05-02 LAB — CBC: RBC: 4.8 (ref 3.87–5.11)

## 2021-05-02 LAB — BASIC METABOLIC PANEL
BUN: 18 (ref 4–21)
CO2: 18 (ref 13–22)
Chloride: 109 — AB (ref 99–108)
Creatinine: 0.9 (ref 0.5–1.1)
Glucose: 175
Potassium: 3.5 (ref 3.4–5.3)
Sodium: 140 (ref 137–147)

## 2021-05-02 LAB — HEPATIC FUNCTION PANEL
ALT: 23 (ref 7–35)
AST: 26 (ref 13–35)
Alkaline Phosphatase: 110 (ref 25–125)
Bilirubin, Total: 0.2

## 2021-05-02 LAB — FERRITIN: Ferritin: 60 ng/mL (ref 11–307)

## 2021-05-02 NOTE — Telephone Encounter (Signed)
Per Lenna Sciara, patient's Follow Up rescheduled to 3/10 at 9:45 am ?

## 2021-05-09 ENCOUNTER — Ambulatory Visit: Payer: Medicare Other | Admitting: Hematology and Oncology

## 2021-05-16 ENCOUNTER — Ambulatory Visit: Payer: Medicare Other | Admitting: Hematology and Oncology

## 2021-05-29 ENCOUNTER — Encounter: Payer: Self-pay | Admitting: Hematology and Oncology

## 2021-05-29 ENCOUNTER — Inpatient Hospital Stay (INDEPENDENT_AMBULATORY_CARE_PROVIDER_SITE_OTHER): Payer: Medicare Other | Admitting: Hematology and Oncology

## 2021-05-29 ENCOUNTER — Telehealth: Payer: Self-pay | Admitting: Hematology and Oncology

## 2021-05-29 DIAGNOSIS — D500 Iron deficiency anemia secondary to blood loss (chronic): Secondary | ICD-10-CM | POA: Diagnosis not present

## 2021-05-29 DIAGNOSIS — M069 Rheumatoid arthritis, unspecified: Secondary | ICD-10-CM | POA: Diagnosis not present

## 2021-05-29 NOTE — Assessment & Plan Note (Signed)
Microcytic hypochromic anemia, secondary to iron deficiency.  She last received IV iron in April 2021. Hemoglobin and iron studies remain normal today. We will continue to monitor and will see her back in 5 months.  ?? ?

## 2021-05-29 NOTE — Progress Notes (Addendum)
?Patient Care Team: ?O'Buch, Alvis Lemmings PA-C as PCP - General (Internal Medicine) ?Daphene Jaeger, PA-C (Librarian, academic) ? ?Clinic Day: 05/29/21 ? ?Referring physician: Jeanie Sewer, NP ? ?ASSESSMENT & PLAN:  ? ?Assessment & Plan: ?Iron deficiency anemia secondary to blood loss (chronic) ?Microcytic hypochromic anemia, secondary to iron deficiency.  She last received IV iron in April 2021. Hemoglobin and iron studies remain normal today. We will continue to monitor and will see her back in 5 months.  ?  ? ?Rheumatoid arthritis ?Severe rheumatoid arthritis.She is being managed by rheumatology with Methotrexate injections.  ? ?The patient understands the plans discussed today and is in agreement with them.  She knows to contact our office if she develops concerns prior to her next appointment. ? ? ? ?Derwood Kaplan, MD  ?[facility] ?[place] ?[place] [address] ?Dept: [phone] ?Dept Fax: [phone]  ? ?No orders of the defined types were placed in this encounter. ?  ? ? ?CHIEF COMPLAINT:  ?CC: A 51 year old female with history of iron deficiency anemia ? ?Current Treatment:  Surveillance ? ?INTERVAL HISTORY:  ?Andrea Wade is here today for repeat clinical assessment. She denies fevers or chills. She denies pain. Her appetite is good. Her weight has been stable. ? ?I have reviewed the past medical history, past surgical history, social history and family history with the patient and they are unchanged from previous note. ? ?ALLERGIES:  is allergic to codeine, peanut-containing drug products, aspirin, corn-containing products, pecan extract allergy skin test, penicillins, shellfish-derived products, and tape. ? ?MEDICATIONS:  ?Current Outpatient Medications  ?Medication Sig Dispense Refill  ? aspirin EC 81 MG tablet Take 81 mg by mouth daily. Swallow whole.    ? baclofen (LIORESAL) 20 MG tablet Take 20 mg by mouth 4 (four) times  daily.    ? buPROPion (WELLBUTRIN XL) 150 MG 24 hr tablet Take 1 tablet by mouth every morning.    ? dicyclomine (BENTYL) 10 MG capsule Take 1 capsule (10 mg total) by mouth 3 (three) times daily as needed for spasms (stomach spasm). 15 capsule 0  ? DULoxetine (CYMBALTA) 60 MG capsule Take 60 mg by mouth daily.    ? EPINEPHrine 0.3 mg/0.3 mL IJ SOAJ injection Inject 0.3 mg into the muscle as needed for anaphylaxis.    ? HUMALOG KWIKPEN 100 UNIT/ML KwikPen Inject into the skin.    ? insulin aspart (NOVOLOG) 100 UNIT/ML FlexPen Inject 16-24 Units into the skin 3 (three) times daily with meals. SLIDING SCALE    ? lisinopril (PRINIVIL,ZESTRIL) 5 MG tablet Take 1 tablet (5 mg total) by mouth daily. 30 tablet 3  ? meloxicam (MOBIC) 7.5 MG tablet Take 7.5 mg by mouth 2 (two) times daily.    ? methotrexate 2.5 MG tablet Take by mouth.    ? nortriptyline (PAMELOR) 75 MG capsule Take 75 mg by mouth at bedtime.    ? Oxycodone HCl 20 MG TABS Take 1 tablet by mouth 4 (four) times daily as needed (pain).    ? OZEMPIC, 0.25 OR 0.5 MG/DOSE, 2 MG/3ML SOPN Inject into the skin.    ? phenol (CHLORASEPTIC) 1.4 % LIQD Use as directed 1 spray in the mouth or throat as needed for throat irritation / pain. 177 mL 0  ? promethazine (PHENERGAN) 12.5 MG tablet Take 12.5 mg by mouth 2 (two) times daily.    ? Rimegepant Sulfate 75 MG TBDP Take 1 tablet every other day for headache  prevention (Patient not taking: Reported on 11/22/2020)    ? SYNJARDY XR 11-998 MG TB24 Take 1 tablet by mouth in the morning and at bedtime.    ? topiramate (TOPAMAX) 200 MG tablet Take 200 mg by mouth 2 (two) times daily.    ? TRESIBA FLEXTOUCH 100 UNIT/ML FlexTouch Pen Inject 50 Units into the skin at bedtime.    ? ?No current facility-administered medications for this visit.  ? ? ?HISTORY OF PRESENT ILLNESS:  ? ?Oncology History  ? No history exists.  ?  ? ? ?REVIEW OF SYSTEMS:  ? ?Constitutional: Denies fevers, chills or abnormal weight loss ?Eyes: Denies  blurriness of vision ?Ears, nose, mouth, throat, and face: Denies mucositis or sore throat ?Respiratory: Denies cough, dyspnea or wheezes ?Cardiovascular: Denies palpitation, chest discomfort or lower extremity swelling ?Gastrointestinal:  Denies nausea, heartburn or change in bowel habits ?Skin: Denies abnormal skin rashes ?Lymphatics: Denies new lymphadenopathy or easy bruising ?Neurological:Denies numbness, tingling or new weaknesses ?Behavioral/Psych: Mood is stable, no new changes  ?All other systems were reviewed with the patient and are negative. ? ? ?VITALS:  ?Blood pressure (!) 105/58, pulse 90, temperature 98.5 ?F (36.9 ?C), temperature source Oral, resp. rate 20, height 5' 8"  (1.727 m), weight 214 lb (97.1 kg), SpO2 97 %.  ?Wt Readings from Last 3 Encounters:  ?05/29/21 214 lb (97.1 kg)  ?11/22/20 218 lb 3.2 oz (99 kg)  ?09/04/20 225 lb 15.5 oz (102.5 kg)  ?  ?Body mass index is 32.54 kg/m?. ? ?Performance status (ECOG): 2 - Symptomatic, <50% confined to bed ? ?PHYSICAL EXAM:  ? ?GENERAL:alert, no distress and comfortable ?SKIN: skin color, texture, turgor are normal, no rashes or significant lesions ?EYES: normal, Conjunctiva are pink and non-injected, sclera clear ?OROPHARYNX:no exudate, no erythema and lips, buccal mucosa, and tongue normal  ?NECK: supple, thyroid normal size, non-tender, without nodularity ?LYMPH:  no palpable lymphadenopathy in the cervical, axillary or inguinal ?LUNGS: clear to auscultation and percussion with normal breathing effort ?HEART: regular rate & rhythm and no murmurs and no lower extremity edema ?ABDOMEN:abdomen soft, non-tender and normal bowel sounds ?Musculoskeletal:no cyanosis of digits and no clubbing  ?NEURO: alert & oriented x 3 with fluent speech, no focal motor/sensory deficits ? ?LABORATORY DATA:  ?I have reviewed the data as listed ?   ?Component Value Date/Time  ? NA 140 05/02/2021 0000  ? K 3.5 05/02/2021 0000  ? CL 109 (A) 05/02/2021 0000  ? CO2 18  05/02/2021 0000  ? GLUCOSE 122 (H) 09/02/2020 0355  ? BUN 18 05/02/2021 0000  ? CREATININE 0.9 05/02/2021 0000  ? CREATININE 0.80 09/02/2020 0355  ? CALCIUM 9.2 05/02/2021 0000  ? PROT 6.7 09/02/2020 0355  ? ALBUMIN 3.9 05/02/2021 0000  ? AST 26 05/02/2021 0000  ? ALT 23 05/02/2021 0000  ? ALKPHOS 110 05/02/2021 0000  ? BILITOT 0.6 09/02/2020 0355  ? GFRNONAA >60 09/02/2020 0355  ? GFRAA >60 04/19/2016 0233  ? ? ?No results found for: SPEP, UPEP ? ?Lab Results  ?Component Value Date  ? WBC 7.0 05/02/2021  ? NEUTROABS 3.15 05/02/2021  ? HGB 11.9 (A) 05/02/2021  ? HCT 37 05/02/2021  ? MCV 77 (A) 11/22/2020  ? PLT 403 (A) 05/02/2021  ? ? ?  Chemistry   ?   ?Component Value Date/Time  ? NA 140 05/02/2021 0000  ? K 3.5 05/02/2021 0000  ? CL 109 (A) 05/02/2021 0000  ? CO2 18 05/02/2021 0000  ? BUN 18 05/02/2021 0000  ?  CREATININE 0.9 05/02/2021 0000  ? CREATININE 0.80 09/02/2020 0355  ? GLU 175 05/02/2021 0000  ?    ?Component Value Date/Time  ? CALCIUM 9.2 05/02/2021 0000  ? ALKPHOS 110 05/02/2021 0000  ? AST 26 05/02/2021 0000  ? ALT 23 05/02/2021 0000  ? BILITOT 0.6 09/02/2020 0355  ?  ? ? ? ?RADIOGRAPHIC STUDIES: ?I have personally reviewed the radiological images as listed and agreed with the findings in the report. ?No results found. ?

## 2021-05-29 NOTE — Assessment & Plan Note (Signed)
Severe rheumatoid arthritis.She is being managed by rheumatology with Methotrexate injections. ?

## 2021-05-29 NOTE — Telephone Encounter (Signed)
Per 05/29/21 los next appt scheduled and confirmed with patient ?

## 2021-06-25 ENCOUNTER — Telehealth: Payer: Self-pay | Admitting: Hematology and Oncology

## 2021-06-25 ENCOUNTER — Other Ambulatory Visit: Payer: Self-pay | Admitting: Hematology and Oncology

## 2021-06-25 DIAGNOSIS — D500 Iron deficiency anemia secondary to blood loss (chronic): Secondary | ICD-10-CM

## 2021-06-25 NOTE — Telephone Encounter (Signed)
06/25/21 left vm-Neck u/s scheduled on 06/30/21 arrive at 11am.Appt at 1130am ?

## 2021-10-28 ENCOUNTER — Other Ambulatory Visit: Payer: Self-pay | Admitting: Oncology

## 2021-10-28 DIAGNOSIS — D500 Iron deficiency anemia secondary to blood loss (chronic): Secondary | ICD-10-CM

## 2021-10-28 NOTE — Progress Notes (Deleted)
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  She last received IV iron in April 2021. Hemoglobin and iron studies remain normal today. We will continue to monitor and will see her back in 5 months.   I  Rheumatoid arthritis Severe rheumatoid arthritis.She is being managed by rheumatology with Methotrexate injections.     The patient understands the plans discussed today and is in agreement with them.  She knows to contact our office if she develops concerns prior to her next appointment.    Andrea Wade, Andrea Wade  [facility] [address] [place] [address] Dept: [phone] Dept Fax: [phone]   No orders of the defined types were placed in this encounter.     CHIEF COMPLAINT:  CC: A 51 year old female with history of iron deficiency anemia  Current Treatment:  Surveillance  INTERVAL HISTORY:  Andrea Wade is here today for repeat clinical assessment. She denies fevers or chills. She denies pain. Her appetite is good. Her weight has been stable.  I have reviewed the past medical history, past surgical history, social history and family history with the patient and they are unchanged from previous note.  ALLERGIES:  is allergic to codeine, peanut-containing drug products, aspirin, corn-containing products, pecan extract allergy skin test, penicillins, shellfish-derived products, and tape.  MEDICATIONS:  Current Outpatient Medications  Medication Sig Dispense Refill   aspirin EC 81 MG tablet Take 81 mg by mouth daily. Swallow whole.     baclofen (LIORESAL) 20 MG tablet Take 20 mg by mouth 4 (four) times  daily.     buPROPion (WELLBUTRIN XL) 150 MG 24 hr tablet Take 1 tablet by mouth every morning.     dicyclomine (BENTYL) 10 MG capsule Take 1 capsule (10 mg total) by mouth 3 (three) times daily as needed for spasms (stomach spasm). 15 capsule 0   DULoxetine (CYMBALTA) 60 MG capsule Take 60 mg by mouth daily.     EPINEPHrine 0.3 mg/0.3 mL IJ SOAJ injection Inject 0.3 mg into the muscle as needed for anaphylaxis.     HUMALOG KWIKPEN 100 UNIT/ML KwikPen Inject into the skin.     insulin aspart (NOVOLOG) 100 UNIT/ML FlexPen Inject 16-24 Units into the skin 3 (three) times daily with meals. SLIDING SCALE     lisinopril (PRINIVIL,ZESTRIL) 5 MG tablet Take 1 tablet (5 mg total) by mouth daily. 30 tablet 3   meloxicam (MOBIC) 7.5 MG tablet Take 7.5 mg by mouth 2 (two) times daily.     methotrexate 2.5 MG tablet Take by mouth.     nortriptyline (PAMELOR) 75 MG capsule Take 75 mg by mouth at bedtime.     Oxycodone HCl 20 MG TABS Take 1 tablet by mouth 4 (four) times daily as needed (pain).     OZEMPIC, 0.25 OR 0.5 MG/DOSE, 2 MG/3ML SOPN Inject into the skin.     phenol (CHLORASEPTIC) 1.4 % LIQD Use as directed 1 spray in the mouth or throat as needed for throat irritation / pain. 177 mL 0   promethazine (PHENERGAN) 12.5 MG tablet Take 12.5 mg by mouth 2 (two) times daily.     Rimegepant Sulfate 75 MG TBDP Take 1 tablet every other day  for headache prevention (Patient not taking: Reported on 11/22/2020)     SYNJARDY XR 11-998 MG TB24 Take 1 tablet by mouth in the morning and at bedtime.     topiramate (TOPAMAX) 200 MG tablet Take 200 mg by mouth 2 (two) times daily.     TRESIBA FLEXTOUCH 100 UNIT/ML FlexTouch Pen Inject 50 Units into the skin at bedtime.     No current facility-administered medications for this visit.    HISTORY OF PRESENT ILLNESS:   Oncology History   No history exists.      REVIEW OF SYSTEMS:   Constitutional: Denies fevers, chills or abnormal weight loss Eyes: Denies  blurriness of vision Ears, nose, mouth, throat, and face: Denies mucositis or sore throat Respiratory: Denies cough, dyspnea or wheezes Cardiovascular: Denies palpitation, chest discomfort or lower extremity swelling Gastrointestinal:  Denies nausea, heartburn or change in bowel habits Skin: Denies abnormal skin rashes Lymphatics: Denies new lymphadenopathy or easy bruising Neurological:Denies numbness, tingling or new weaknesses Behavioral/Psych: Mood is stable, no new changes  All other systems were reviewed with the patient and are negative.   VITALS:  There were no vitals taken for this visit.  Wt Readings from Last 3 Encounters:  05/29/21 214 lb (97.1 kg)  11/22/20 218 lb 3.2 oz (99 kg)  09/04/20 225 lb 15.5 oz (102.5 kg)    There is no height or weight on file to calculate BMI.  Performance status (ECOG): 2 - Symptomatic, <50% confined to bed  PHYSICAL EXAM:   GENERAL:alert, no distress and comfortable SKIN: skin color, texture, turgor are normal, no rashes or significant lesions EYES: normal, Conjunctiva are pink and non-injected, sclera clear OROPHARYNX:no exudate, no erythema and lips, buccal mucosa, and tongue normal  NECK: supple, thyroid normal size, non-tender, without nodularity LYMPH:  no palpable lymphadenopathy in the cervical, axillary or inguinal LUNGS: clear to auscultation and percussion with normal breathing effort HEART: regular rate & rhythm and no murmurs and no lower extremity edema ABDOMEN:abdomen soft, non-tender and normal bowel sounds Musculoskeletal:no cyanosis of digits and no clubbing  NEURO: alert & oriented x 3 with fluent speech, no focal motor/sensory deficits  LABORATORY DATA:  I have reviewed the data as listed    Component Value Date/Time   NA 140 05/02/2021 0000   K 3.5 05/02/2021 0000   CL 109 (A) 05/02/2021 0000   CO2 18 05/02/2021 0000   GLUCOSE 122 (H) 09/02/2020 0355   BUN 18 05/02/2021 0000   CREATININE 0.9 05/02/2021 0000    CREATININE 0.80 09/02/2020 0355   CALCIUM 9.2 05/02/2021 0000   PROT 6.7 09/02/2020 0355   ALBUMIN 3.9 05/02/2021 0000   AST 26 05/02/2021 0000   ALT 23 05/02/2021 0000   ALKPHOS 110 05/02/2021 0000   BILITOT 0.6 09/02/2020 0355   GFRNONAA >60 09/02/2020 0355   GFRAA >60 04/19/2016 0233    No results found for: "SPEP", "UPEP"  Lab Results  Component Value Date   WBC 7.0 05/02/2021   NEUTROABS 3.15 05/02/2021   HGB 11.9 (A) 05/02/2021   HCT 37 05/02/2021   MCV 77 (A) 11/22/2020   PLT 403 (A) 05/02/2021      Chemistry      Component Value Date/Time   NA 140 05/02/2021 0000   K 3.5 05/02/2021 0000   CL 109 (A) 05/02/2021 0000   CO2 18 05/02/2021 0000   BUN 18 05/02/2021 0000   CREATININE 0.9 05/02/2021 0000   CREATININE 0.80 09/02/2020 0355   GLU 175  05/02/2021 0000      Component Value Date/Time   CALCIUM 9.2 05/02/2021 0000   ALKPHOS 110 05/02/2021 0000   AST 26 05/02/2021 0000   ALT 23 05/02/2021 0000   BILITOT 0.6 09/02/2020 0355       RADIOGRAPHIC STUDIES: I have personally reviewed the radiological images as listed and agreed with the findings in the report. No results found.

## 2021-10-29 ENCOUNTER — Inpatient Hospital Stay: Payer: Medicare Other

## 2021-10-29 ENCOUNTER — Telehealth: Payer: Self-pay | Admitting: Oncology

## 2021-10-29 ENCOUNTER — Telehealth: Payer: Self-pay

## 2021-10-29 ENCOUNTER — Ambulatory Visit: Payer: Medicare Other | Admitting: Oncology

## 2021-10-29 DIAGNOSIS — D500 Iron deficiency anemia secondary to blood loss (chronic): Secondary | ICD-10-CM

## 2021-10-29 NOTE — Telephone Encounter (Signed)
-----   Message from Derwood Kaplan, MD sent at 10/28/2021  5:50 PM EDT ----- Regarding: [place] Melissa ordered an [place] for May, was that ever done?

## 2021-10-29 NOTE — Telephone Encounter (Signed)
10/29/21 spoke with patient -not feeling well.Rescheduled appt

## 2021-10-29 NOTE — Telephone Encounter (Signed)
Patient did not get her ultrasound done. Unable to find report.

## 2021-10-31 ENCOUNTER — Other Ambulatory Visit: Payer: Self-pay

## 2021-11-05 NOTE — Progress Notes (Signed)
[facility]  [address] [place],  Andrea Wade  [id number] [phone]  Clinic Day:  11/06/2021  Referring physician: Janine Limbo, PA-C  ASSESSMENT & PLAN:   Assessment & Plan: Iron deficiency anemia secondary to blood loss (chronic) History of iron deficiency anemia. She last received IV iron in April 2021.  She has not had a menstrual cycle for about 8 months.  Hemoglobin remains normal.  Iron studies are normal but the ferritin has decreased from previous.  We will continue to monitor her in  3 months for repeat clinical assessment.  Rheumatoid arthritis Severe rheumatoid arthritis. She is being managed by rheumatology with methotrexate and Humira.  Breast tenderness in female Bilateral breast tenderness without palpable masses.  Bilateral diagnostic mammogram and ultrasound in January 2016 revealed bilateral breast cysts, no evidence of malignancy. Bilateral screening mammogram in 1 year was recommended. She has not had a mammogram since, so I will schedule her for screening mammogram.   The patient understands the plans discussed today and is in agreement with them.  She knows to contact our office if she develops concerns prior to her next appointment.   I provided 30 minutes of face-to-face time during this encounter and > 50% was spent counseling as documented under my assessment and plan.    [name], PA-C  [facility] AT [facility] [address] [place] [address] Dept: [phone] Dept Fax: [phone]   Orders Placed This Encounter  Procedures   MM DIGITAL SCREENING BILATERAL    Standing Status:   Future    Standing Expiration Date:   11/07/2022    Scheduling Instructions:     RH    Order Specific Question:   Reason for exam:    Answer:   Breast cancer screening, known fibrocystic changes    Order Specific Question:   Preferred imaging location?    Answer:   External    CBC and differential    This external order was created through the Results Console.   CBC    This external order was created through the Results Console.   CBC    This order was created through External Result Entry      CHIEF COMPLAINT:  CC: Iron deficiency anemia  Current Treatment:  Observation  HISTORY OF PRESENT ILLNESS:  Andrea Wade is a 51 y.o. female with a iron deficiency anemia who we began seeing in March 2021 when her anemia did not respond to oral iron supplementation twice daily for 6 months. Her iron deficiency was felt to be due to heavy menses. The patient reported a history of anemia for the past 20 years. She had a colonoscopy in March 2016 with Dr. Lyndel Safe, due to rectal bleeding, anemia and diarrhea.  This revealed internal hemorrhoids, felt to be the cause of the rectal bleeding, with very minimal sigmoid diverticulosis, otherwise normal. Further evaluation of the anemia  did not reveal any evidence of hemolysis, monoclonal gammopathy or other nutritional deficiency contributing to her anemia.  Her B12 was low normal at 278, but a methylmalonic acid could not be added at that time.  Hemoglobin electrophoresis was normal.  Rheumatoid factor and ANA were positive.  The patient was instructed to bring back stool Hemoccult cards, but did not do so.  Oral iron was discontinued due to poor absorption.  She was given IV iron in the form of Feraheme in April 2021.  She has not required  treatment since.  INTERVAL HISTORY:  Andrea Wade is here today for repeat clinical assessment and reports persistent fatigue.  She states her periods stopped about 8 months ago.  She denies other overt form of blood loss.  She is on treatment with methotrexate and Humira for her rheumatoid arthritis.  She has chronic pain managed by pain management.  She reports bilateral breast pain, left greater than right.  She has not had a mammogram since 2016.  She denies fevers or chills. She back and hand  pain. Her appetite is good. Her weight has increased 3 pounds over last 3 months .  REVIEW OF SYSTEMS:  Review of Systems  Constitutional:  Positive for fatigue. Negative for appetite change, chills, fever and unexpected weight change.  HENT:   Negative for lump/mass, mouth sores and sore throat.   Respiratory:  Negative for cough and shortness of breath.   Cardiovascular:  Negative for chest pain and leg swelling.  Gastrointestinal:  Negative for abdominal pain, constipation, diarrhea, nausea and vomiting.  Endocrine: Negative for hot flashes.  Genitourinary:  Negative for difficulty urinating, dysuria, frequency and hematuria.   Musculoskeletal:  Positive for arthralgias (Mainly hands) and back pain. Negative for myalgias.  Skin:  Negative for rash.  Neurological:  Negative for dizziness and headaches.  Hematological:  Negative for adenopathy. Does not bruise/bleed easily.  Psychiatric/Behavioral:  Negative for depression and sleep disturbance. The patient is nervous/anxious.      VITALS:  Blood pressure (!) 110/56, pulse 85, temperature 98.2 F (36.8 C), temperature source Oral, resp. rate 20, height 5' 8"  (1.727 m), weight 217 lb 6.4 oz (98.6 kg), SpO2 97 %.  Wt Readings from Last 3 Encounters:  11/06/21 217 lb 6.4 oz (98.6 kg)  05/29/21 214 lb (97.1 kg)  11/22/20 218 lb 3.2 oz (99 kg)    Body mass index is 33.06 kg/m.  Performance status (ECOG): 2 - Symptomatic, <50% confined to bed  PHYSICAL EXAM:  Physical Exam Vitals and nursing note reviewed.  Constitutional:      General: She is not in acute distress.    Appearance: Normal appearance.  HENT:     Head: Normocephalic and atraumatic.     Mouth/Throat:     Mouth: Mucous membranes are moist.     Pharynx: Oropharynx is clear. No oropharyngeal exudate or posterior oropharyngeal erythema.  Eyes:     General: No scleral icterus.    Extraocular Movements: Extraocular movements intact.     Conjunctiva/sclera: Conjunctivae  normal.     Pupils: Pupils are equal, round, and reactive to light.  Cardiovascular:     Rate and Rhythm: Normal rate and regular rhythm.     Heart sounds: Normal heart sounds. No murmur heard.    No friction rub. No gallop.  Pulmonary:     Effort: Pulmonary effort is normal.     Breath sounds: Normal breath sounds. No wheezing, rhonchi or rales.  Chest:  Breasts:    Right: Inverted nipple (chronic) and tenderness present. No swelling, bleeding, mass, nipple discharge or skin change.     Left: Inverted nipple (chronic) and tenderness present. No swelling, bleeding, mass, nipple discharge or skin change.  Abdominal:     General: There is no distension.     Palpations: Abdomen is soft. There is no hepatomegaly, splenomegaly or mass.     Tenderness: There is no abdominal tenderness.  Musculoskeletal:        General: Normal range of motion.     Right hand: Deformity (  due to RA) present.     Left hand: Deformity (due to RA) present.     Cervical back: Normal range of motion and neck supple. No tenderness.     Right lower leg: No edema.     Left lower leg: No edema.  Lymphadenopathy:     Cervical: No cervical adenopathy.     Upper Body:     Right upper body: No supraclavicular or axillary adenopathy.     Left upper body: No supraclavicular or axillary adenopathy.     Lower Body: No right inguinal adenopathy. No left inguinal adenopathy.  Skin:    General: Skin is warm and dry.     Coloration: Skin is not jaundiced.     Findings: No rash.  Neurological:     Mental Status: She is alert and oriented to person, place, and time.     Cranial Nerves: No cranial nerve deficit.  Psychiatric:        Mood and Affect: Mood normal.        Behavior: Behavior normal.        Thought Content: Thought content normal.     LABS:      Latest Ref Rng & Units 11/06/2021   12:00 AM 05/02/2021   12:00 AM 11/22/2020   12:00 AM  CBC  WBC  7.8     7.0  11.6      Hemoglobin 12.0 - 16.0 12.3     11.9   11.8      Hematocrit 36 - 46 38     37  38      Platelets 150 - 400 K/uL 202     403  475         This result is from an external source.      Latest Ref Rng & Units 05/02/2021   12:00 AM 11/22/2020   12:00 AM 09/02/2020    3:55 AM  CMP  Glucose 70 - 99 mg/dL   122   BUN 4 - 21 18  17     11    Creatinine 0.5 - 1.1 0.9  0.9     0.80   Sodium 137 - 147 140  139     136   Potassium 3.4 - 5.3 3.5  4.1     3.7   Chloride 99 - 108 109  106     111   CO2 13 - 22 18  18     20    Calcium 8.7 - 10.7 9.2  9.8     8.2   Total Protein 6.5 - 8.1 g/dL   6.7   Total Bilirubin 0.3 - 1.2 mg/dL   0.6   Alkaline Phos 25 - 125 110  134     76   AST 13 - 35 26  22     11    ALT 7 - 35 23  16     11       This result is from an external source.     No results found for: "CEA1", "CEA" / No results found for: "CEA1", "CEA" No results found for: "PSA1" No results found for: "POE423" No results found for: "CAN125"  No results found for: "TOTALPROTELP", "ALBUMINELP", "A1GS", "A2GS", "BETS", "BETA2SER", "GAMS", "MSPIKE", "SPEI" Lab Results  Component Value Date   TIBC 337 11/06/2021   TIBC 294 05/02/2021   TIBC 287 11/22/2020   FERRITIN 16 11/06/2021   FERRITIN 60 05/02/2021   FERRITIN 48 11/22/2020  IRONPCTSAT 25 11/06/2021   IRONPCTSAT 18 05/02/2021   IRONPCTSAT 8 (L) 11/22/2020   No results found for: "LDH"  STUDIES:  No results found.    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[place])    "had 1-2/night; started RX; last one was 01/2014"   GERD (gastroesophageal reflux disease)    Migraine    "q day" (04/17/2013)   MS (multiple sclerosis) ([place])    Pneumonia    "about 10 times in my lifetime" (04/17/2013)    Past Surgical History:  Procedure Laterality Date   CARDIAC  CATHETERIZATION N/A 10/08/2015   Procedure: Left Heart Cath and Coronary Angiography;  Surgeon: Peter M Martinique, MD;  Location: [facility];  Service: Cardiovascular;  Laterality: N/A;   CHOLECYSTECTOMY  1994   TUBAL LIGATION  1993    Family History  Problem Relation Age of Onset   Diabetes Mother    Heart disease Mother    Stroke Mother    Heart failure Mother    Heart disease Father    Stroke Father    Heart failure Father    Heart attack Brother    Healthy Brother     Social History:  reports that she has quit smoking. Her smoking use included cigarettes. She has a 10.50 pack-year smoking history. She has never used smokeless tobacco. She reports that she does not drink alcohol and does not use drugs.The patient is alone today.  Allergies:  Allergies  Allergen Reactions   Codeine Hives, Itching and Palpitations   Peanut-Containing Drug Products Anaphylaxis and Itching   Aspirin Itching    High doses only-baby is ok   Corn-Containing Products Rash   Pecan Extract Allergy Skin Test Itching   Penicillins Hives, Itching and Nausea And Vomiting    Allergy from childhood Has patient had a PCN reaction causing immediate rash, facial/tongue/throat swelling, SOB or lightheadedness with hypotension: Yes Has patient had a PCN reaction causing severe rash involving mucus membranes or skin necrosis: Unk Has patient had a PCN reaction that required hospitalization: Unk Has patient had a PCN reaction occurring within the last 10 years: No If all of the above answers are "NO", then may proceed with Cephalosporin use.     Shellfish-Derived Products Rash and Swelling   Tape Other (See Comments)    Slight irritation Other reaction(s): Other (See Comments) Slight irritation    Current Medications: Current Outpatient Medications  Medication Sig Dispense Refill   Adalimumab (HUMIRA PEN) 40 MG/0.4ML PNKT INJECT 40MG SUBCUTANEOUSLY EVERY 2 WEEKS     baclofen (LIORESAL) 20 MG tablet  Take 20 mg by mouth 4 (four) times daily.     buPROPion (WELLBUTRIN XL) 150 MG 24 hr tablet Take 1 tablet by mouth every morning.     dicyclomine (BENTYL) 10 MG capsule Take by mouth.     divalproex (DEPAKOTE ER) 500 MG 24 hr tablet Take 500 mg by mouth at bedtime.     DULoxetine (CYMBALTA) 60 MG capsule Take 60 mg by mouth daily.     Empagliflozin-metFORMIN HCl ER (SYNJARDY XR) 11-998 MG TB24 Take 2 tablets by mouth daily.     EPINEPHrine 0.3 mg/0.3 mL IJ SOAJ  injection Inject 0.3 mg into the muscle as needed for anaphylaxis.     esomeprazole (NEXIUM) 40 MG capsule Take 40 mg by mouth daily.     folic acid (FOLVITE) 1 MG tablet Take 1 mg by mouth daily.     Glucagon (BAQSIMI ONE PACK) 3 MG/DOSE POWD Place into the nose.     HUMALOG KWIKPEN 100 UNIT/ML KwikPen Inject into the skin.     HYDROmorphone (DILAUDID) 4 MG tablet TAKE 1 TABLET BY MOUTH FOUR TIMES A DAY (DNFB 08/09/21)     lisinopril (PRINIVIL,ZESTRIL) 5 MG tablet Take 1 tablet (5 mg total) by mouth daily. 30 tablet 3   meloxicam (MOBIC) 7.5 MG tablet Take 7.5 mg by mouth 2 (two) times daily.     methotrexate (RHEUMATREX) 2.5 MG tablet Take by mouth.     nortriptyline (PAMELOR) 75 MG capsule Take 75 mg by mouth at bedtime.     OZEMPIC, 0.25 OR 0.5 MG/DOSE, 2 MG/3ML SOPN Inject into the skin.     phenol (CHLORASEPTIC) 1.4 % LIQD Use as directed 1 spray in the mouth or throat as needed for throat irritation / pain. 177 mL 0   promethazine (PHENERGAN) 12.5 MG tablet Take 12.5 mg by mouth 2 (two) times daily.     Rimegepant Sulfate 75 MG TBDP Take 1 tablet every other day for headache prevention (Patient not taking: Reported on 11/22/2020)     rosuvastatin (CRESTOR) 5 MG tablet Take 1 tablet by mouth daily.     SYNJARDY XR 11-998 MG TB24 Take 1 tablet by mouth in the morning and at bedtime.     topiramate (TOPAMAX) 200 MG tablet Take 200 mg by mouth 2 (two) times daily.     TRESIBA FLEXTOUCH 100 UNIT/ML FlexTouch Pen Inject 50 Units into  the skin at bedtime.     No current facility-administered medications for this visit.

## 2021-11-05 NOTE — Assessment & Plan Note (Addendum)
Severe rheumatoid arthritis. She is being managed by rheumatology with methotrexate and Humira.

## 2021-11-05 NOTE — Assessment & Plan Note (Addendum)
History of iron deficiency anemia. She last received IV iron in April 2021.  She has not had a menstrual cycle for about 8 months.  Hemoglobin remains normal.  Iron studies are normal but the ferritin has decreased from previous.  We will continue to monitor her in  3 months for repeat clinical assessment.

## 2021-11-06 ENCOUNTER — Encounter: Payer: Self-pay | Admitting: Hematology and Oncology

## 2021-11-06 ENCOUNTER — Inpatient Hospital Stay (INDEPENDENT_AMBULATORY_CARE_PROVIDER_SITE_OTHER): Payer: Medicare Other | Admitting: Hematology and Oncology

## 2021-11-06 ENCOUNTER — Inpatient Hospital Stay: Payer: Medicare Other | Attending: Hematology and Oncology

## 2021-11-06 VITALS — BP 110/56 | HR 85 | Temp 98.2°F | Resp 20 | Ht 68.0 in | Wt 217.4 lb

## 2021-11-06 DIAGNOSIS — D500 Iron deficiency anemia secondary to blood loss (chronic): Secondary | ICD-10-CM

## 2021-11-06 DIAGNOSIS — N644 Mastodynia: Secondary | ICD-10-CM | POA: Insufficient documentation

## 2021-11-06 DIAGNOSIS — Z1231 Encounter for screening mammogram for malignant neoplasm of breast: Secondary | ICD-10-CM | POA: Diagnosis not present

## 2021-11-06 LAB — IRON AND TIBC
Iron: 85 ug/dL (ref 28–170)
Saturation Ratios: 25 % (ref 10.4–31.8)
TIBC: 337 ug/dL (ref 250–450)
UIBC: 252 ug/dL

## 2021-11-06 LAB — CBC AND DIFFERENTIAL
HCT: 38 (ref 36–46)
Hemoglobin: 12.3 (ref 12.0–16.0)
Neutrophils Absolute: 2.34
Platelets: 202 10*3/uL (ref 150–400)
WBC: 7.8

## 2021-11-06 LAB — CBC
MCV: 88 (ref 81–99)
RBC: 4.29 (ref 3.87–5.11)

## 2021-11-06 LAB — FERRITIN: Ferritin: 16 ng/mL (ref 11–307)

## 2021-11-06 NOTE — Assessment & Plan Note (Signed)
Bilateral breast tenderness without palpable masses.  Bilateral diagnostic mammogram and ultrasound in January 2016 revealed bilateral breast cysts, no evidence of malignancy. Bilateral screening mammogram in 1 year was recommended. She has not had a mammogram since, so I will schedule her for screening mammogram.

## 2021-11-11 ENCOUNTER — Telehealth: Payer: Self-pay

## 2021-11-11 NOTE — Telephone Encounter (Signed)
Pt called req lab results.  [phone]

## 2021-11-11 NOTE — Telephone Encounter (Signed)
Pt notified of Kelli's response. She verbalized understanding. She states, "I wanted to know about the blood smear"?

## 2021-12-12 ENCOUNTER — Other Ambulatory Visit: Payer: Self-pay

## 2021-12-15 ENCOUNTER — Inpatient Hospital Stay: Payer: Medicare Other

## 2021-12-15 ENCOUNTER — Ambulatory Visit: Payer: Medicare Other | Admitting: Hematology and Oncology

## 2022-01-29 NOTE — Progress Notes (Signed)
Reading  [address] [place],  [place]  [address] [phone]  Clinic Day:  02/05/22  Referring physician: Janine Limbo, PA-C    CHIEF COMPLAINT:  CC:   Iron deficiency anemia  Current Treatment:   Observation    HISTORY OF PRESENT ILLNESS:  Andrea Wade is a 51 y.o. female with a iron deficiency anemia who we began seeing in March 2021 when her anemia did not respond to oral iron supplementation twice daily for 6 months.  Her iron deficiency was felt to be due to heavy menses. The patient reported a history of anemia for the past 20 years. She had a colonoscopy in March 2016 with Dr. Lyndel Safe, due to rectal bleeding, anemia and diarrhea.  This revealed internal hemorrhoids, felt to be the cause of the rectal bleeding, with very minimal sigmoid diverticulosis, otherwise normal. Further evaluation of the anemia  did not reveal any evidence of hemolysis, monoclonal gammopathy or other nutritional deficiency contributing to her anemia.  Her B12 was low normal at 278, but a methylmalonic acid could not be added at that time.  Hemoglobin electrophoresis was normal.  Rheumatoid factor and ANA were positive.  The patient was instructed to bring back stool Hemoccult cards, but did not do so.  Oral iron was discontinued due to poor absorption.  She was given IV iron in the form of Feraheme in April 2021.  She also has a history of stroke x2 with associated dysphagia and memory difficulties, seizures, rheumatoid arthritis, polyneuropathy, diabetes and depression and anxiety.  INTERVAL HISTORY:  Andrea Wade is here for routine follow up. Hemoglobin is still holding at 12.1. Iron studies are normal. Chemistries are normal, shows slight dehydration with a BUN of 26. Otherwise everything was normal.  Her arthritis has not improved. She states she hasn't felt good since this past weekend, stating she feels achy. She complains of coughing up green liquid with spots in  it. She will see her PCP today. She comes into the clinic in the wheelchair. Her  appetite is fair, and she has gained 2 pounds since her last visit.  She denies fever.  She denies nausea, vomiting, or bowel issues.  She denies sore throat, cough, dyspnea, or chest pain.  REVIEW OF SYSTEMS:  Review of Systems  Constitutional:  Positive for chills. Negative for appetite change, fatigue, fever and unexpected weight change.  HENT:  Negative.    Eyes: Negative.   Respiratory: Negative.  Negative for chest tightness, cough, hemoptysis, shortness of breath and wheezing.   Cardiovascular: Negative.  Negative for chest pain, leg swelling and palpitations.  Gastrointestinal:  Positive for abdominal pain (left upper quadrant). Negative for abdominal distention, blood in stool, constipation, diarrhea, nausea and vomiting.  Endocrine: Negative.   Genitourinary:  Positive for hematuria. Negative for difficulty urinating, dysuria and frequency.   Musculoskeletal:  Positive for flank pain (bilateral). Negative for arthralgias, back pain, gait problem and myalgias.  Skin: Negative.   Neurological: Negative.  Negative for dizziness, extremity weakness, gait problem, headaches, light-headedness, numbness, seizures and speech difficulty.  Hematological: Negative.   Psychiatric/Behavioral:  Negative for depression and sleep disturbance. The patient is nervous/anxious (with panic attacks).      VITALS:  Blood pressure 103/73, pulse 95, temperature 98.2 F (36.8 C), temperature source Oral, resp. rate 18, height 5' 8"  (1.727 m), weight 219 lb 9.6 oz (99.6 kg), SpO2 97 %.  Wt Readings from Last 3 Encounters:  02/05/22 219 lb 9.6 oz (99.6  kg)  11/06/21 217 lb 6.4 oz (98.6 kg)  05/29/21 214 lb (97.1 kg)    Body mass index is 33.39 kg/m.  Performance status (ECOG): 2 - Symptomatic, <50% confined to bed  PHYSICAL EXAM:  Physical Exam Constitutional:      General: She is not in acute distress.    Appearance:  Normal appearance. She is normal weight.  HENT:     Head: Normocephalic and atraumatic.  Eyes:     General: No scleral icterus.    Extraocular Movements: Extraocular movements intact.     Conjunctiva/sclera: Conjunctivae normal.     Pupils: Pupils are equal, round, and reactive to light.  Neck:     Comments: 1 lymph node measuring 1 cm in the right posterior neck. It feels benign. Cardiovascular:     Rate and Rhythm: Normal rate and regular rhythm.     Pulses: Normal pulses.     Heart sounds: Normal heart sounds. No murmur heard.    No friction rub. No gallop.  Pulmonary:     Effort: Pulmonary effort is normal. No respiratory distress.     Breath sounds: Normal breath sounds.  Abdominal:     General: Bowel sounds are normal. There is no distension.     Palpations: Abdomen is soft. There is no hepatomegaly, splenomegaly or mass.     Tenderness: There is abdominal tenderness (of the bilateral flanks) in the left upper quadrant.  Musculoskeletal:        General: Normal range of motion.     Cervical back: Normal range of motion and neck supple.     Right lower leg: No edema.     Left lower leg: No edema.     Comments: Repertory deformities of her hands   Lymphadenopathy:     Cervical: Cervical adenopathy (right cervical, 1 cm) present.  Skin:    General: Skin is warm and dry.  Neurological:     General: No focal deficit present.     Mental Status: She is alert and oriented to person, place, and time. Mental status is at baseline.  Psychiatric:        Mood and Affect: Mood normal.        Behavior: Behavior normal.        Thought Content: Thought content normal.        Judgment: Judgment normal.    LABS:      Latest Ref Rng & Units 02/03/2022   10:52 AM 11/06/2021   12:00 AM 05/02/2021   12:00 AM  CBC  WBC 4.0 - 10.5 K/uL 6.4  7.8     7.0   Hemoglobin 12.0 - 15.0 g/dL 12.1  12.3     11.9   Hematocrit 36.0 - 46.0 % 38.8  38     37   Platelets 150 - 400 K/uL 218  202     403       This result is from an external source.      Latest Ref Rng & Units 02/03/2022   10:52 AM 05/02/2021   12:00 AM 11/22/2020   12:00 AM  CMP  Glucose 70 - 99 mg/dL 136     BUN 6 - 20 mg/dL 26  18  17       Creatinine 0.44 - 1.00 mg/dL 0.92  0.9  0.9      Sodium 135 - 145 mmol/L 140  140  139      Potassium 3.5 - 5.1 mmol/L 4.2  3.5  4.1  Chloride 98 - 111 mmol/L 111  109  106      CO2 22 - 32 mmol/L 19  18  18       Calcium 8.9 - 10.3 mg/dL 9.2  9.2  9.8      Total Protein 6.5 - 8.1 g/dL 7.5     Total Bilirubin 0.3 - 1.2 mg/dL 0.5     Alkaline Phos 38 - 126 U/L 61  110  134      AST 15 - 41 U/L 20  26  22       ALT 0 - 44 U/L 20  23  16          This result is from an external source.    Lab Results  Component Value Date   TIBC 319 02/03/2022   TIBC 337 11/06/2021   TIBC 294 05/02/2021   FERRITIN 20 02/03/2022   FERRITIN 16 11/06/2021   FERRITIN 60 05/02/2021   IRONPCTSAT 20 02/03/2022   IRONPCTSAT 25 11/06/2021   IRONPCTSAT 18 05/02/2021   No results found for: "LDH"  STUDIES:  No results found.    HISTORY:   Allergies:  Allergies  Allergen Reactions   Codeine Hives, Itching and Palpitations   Peanut-Containing Drug Products Anaphylaxis and Itching   Aspirin Itching    High doses only-baby is ok   Corn-Containing Products Rash   Pecan Extract Allergy Skin Test Itching   Penicillins Hives, Itching and Nausea And Vomiting    Allergy from childhood Has patient had a PCN reaction causing immediate rash, facial/tongue/throat swelling, SOB or lightheadedness with hypotension: Yes Has patient had a PCN reaction causing severe rash involving mucus membranes or skin necrosis: Unk Has patient had a PCN reaction that required hospitalization: Unk Has patient had a PCN reaction occurring within the last 10 years: No If all of the above answers are "NO", then may proceed with Cephalosporin use.     Shellfish-Derived Products Rash and Swelling   Tape Other (See  Comments)    Slight irritation Other reaction(s): Other (See Comments) Slight irritation    Current Medications: Current Outpatient Medications  Medication Sig Dispense Refill   fentaNYL (DURAGESIC) 25 MCG/HR 1 patch every 3 (three) days.     methotrexate (RHEUMATREX) 2.5 MG tablet Take by mouth.     baclofen (LIORESAL) 20 MG tablet Take 20 mg by mouth 4 (four) times daily.     buPROPion (WELLBUTRIN XL) 150 MG 24 hr tablet Take 1 tablet by mouth every morning.     dicyclomine (BENTYL) 10 MG capsule Take by mouth.     divalproex (DEPAKOTE ER) 500 MG 24 hr tablet Take 500 mg by mouth at bedtime.     DULoxetine (CYMBALTA) 60 MG capsule Take 60 mg by mouth daily.     EPINEPHrine 0.3 mg/0.3 mL IJ SOAJ injection Inject 0.3 mg into the muscle as needed for anaphylaxis.     esomeprazole (NEXIUM) 40 MG capsule Take 40 mg by mouth daily.     folic acid (FOLVITE) 1 MG tablet Take 1 mg by mouth daily.     Glucagon (BAQSIMI ONE PACK) 3 MG/DOSE POWD Place into the nose.     HUMALOG KWIKPEN 100 UNIT/ML KwikPen Inject into the skin.     HYDROmorphone (DILAUDID) 2 MG tablet Take 2 mg by mouth 4 (four) times daily.     lisinopril (PRINIVIL,ZESTRIL) 5 MG tablet Take 1 tablet (5 mg total) by mouth daily. 30 tablet 3   meloxicam (MOBIC) 7.5 MG  tablet Take 7.5 mg by mouth 2 (two) times daily.     nortriptyline (PAMELOR) 75 MG capsule Take 75 mg by mouth at bedtime.     OZEMPIC, 0.25 OR 0.5 MG/DOSE, 2 MG/3ML SOPN Inject into the skin.     phenol (CHLORASEPTIC) 1.4 % LIQD Use as directed 1 spray in the mouth or throat as needed for throat irritation / pain. 177 mL 0   promethazine (PHENERGAN) 12.5 MG tablet Take 12.5 mg by mouth 2 (two) times daily.     Rimegepant Sulfate 75 MG TBDP Take 1 tablet every other day for headache prevention (Patient not taking: Reported on 11/22/2020)     rosuvastatin (CRESTOR) 5 MG tablet Take 1 tablet by mouth daily.     SYNJARDY XR 11-998 MG TB24 Take 1 tablet by mouth in the  morning and at bedtime.     topiramate (TOPAMAX) 200 MG tablet Take 200 mg by mouth 2 (two) times daily.     TRESIBA FLEXTOUCH 100 UNIT/ML FlexTouch Pen Inject 50 Units into the skin at bedtime.     No current facility-administered medications for this visit.    ASSESSMENT & PLAN:   Assessment:  1. Microcytic hypochromic anemia, secondary to iron deficiency.  She last received IV iron in April 2021. Her iron levels are holding from today's visit 02/05/2022.   2. 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 No splenomegaly noted today, but she does have left upper quadrant tenderness.   3. Severe rheumatoid arthritis.    4. 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  5. History of strokes.  6. Acute bronchitis. She will have her PCP address this today 02/05/2022.  Plan:    Her iron studies are normal. I have released her from my care but assured her that if her iron drops she can call the office. She will continue to follow with PCP and Rheumatology and they can call me if they feel she needs to be seen. She verbalizes understanding of and agreement to the plans discussed today. She knows to call the office should any new questions or concerns arise.   I provided 15 minutes of face-to-face time during this this encounter and > 50% was spent counseling as documented under my assessment and plan.    I,Gabriella Ballesteros,acting as a scribe for Derwood Kaplan, MD.,have documented all relevant documentation on the behalf of Derwood Kaplan, MD,as directed by  Derwood Kaplan, MD while in the presence of Derwood Kaplan, MD.   I have reviewed this report as typed by the medical scribe, and it is complete and accurate.

## 2022-02-03 ENCOUNTER — Other Ambulatory Visit: Payer: Self-pay | Admitting: Hematology and Oncology

## 2022-02-03 ENCOUNTER — Inpatient Hospital Stay: Payer: Medicare Other | Attending: Hematology and Oncology

## 2022-02-03 DIAGNOSIS — K625 Hemorrhage of anus and rectum: Secondary | ICD-10-CM | POA: Diagnosis not present

## 2022-02-03 DIAGNOSIS — E1142 Type 2 diabetes mellitus with diabetic polyneuropathy: Secondary | ICD-10-CM | POA: Insufficient documentation

## 2022-02-03 DIAGNOSIS — Z8673 Personal history of transient ischemic attack (TIA), and cerebral infarction without residual deficits: Secondary | ICD-10-CM | POA: Insufficient documentation

## 2022-02-03 DIAGNOSIS — D509 Iron deficiency anemia, unspecified: Secondary | ICD-10-CM | POA: Insufficient documentation

## 2022-02-03 DIAGNOSIS — D500 Iron deficiency anemia secondary to blood loss (chronic): Secondary | ICD-10-CM

## 2022-02-03 DIAGNOSIS — J209 Acute bronchitis, unspecified: Secondary | ICD-10-CM | POA: Insufficient documentation

## 2022-02-03 DIAGNOSIS — M069 Rheumatoid arthritis, unspecified: Secondary | ICD-10-CM | POA: Insufficient documentation

## 2022-02-03 LAB — CBC WITH DIFFERENTIAL (CANCER CENTER ONLY)
Abs Immature Granulocytes: 0.03 10*3/uL (ref 0.00–0.07)
Basophils Absolute: 0.1 10*3/uL (ref 0.0–0.1)
Basophils Relative: 1 %
Eosinophils Absolute: 0.2 10*3/uL (ref 0.0–0.5)
Eosinophils Relative: 3 %
HCT: 38.8 % (ref 36.0–46.0)
Hemoglobin: 12.1 g/dL (ref 12.0–15.0)
Immature Granulocytes: 1 %
Lymphocytes Relative: 50 %
Lymphs Abs: 3.3 10*3/uL (ref 0.7–4.0)
MCH: 29.4 pg (ref 26.0–34.0)
MCHC: 31.2 g/dL (ref 30.0–36.0)
MCV: 94.4 fL (ref 80.0–100.0)
Monocytes Absolute: 0.5 10*3/uL (ref 0.1–1.0)
Monocytes Relative: 7 %
Neutro Abs: 2.4 10*3/uL (ref 1.7–7.7)
Neutrophils Relative %: 38 %
Platelet Count: 218 10*3/uL (ref 150–400)
RBC: 4.11 MIL/uL (ref 3.87–5.11)
RDW: 15.7 % — ABNORMAL HIGH (ref 11.5–15.5)
WBC Count: 6.4 10*3/uL (ref 4.0–10.5)
nRBC: 0 % (ref 0.0–0.2)

## 2022-02-03 LAB — CMP (CANCER CENTER ONLY)
ALT: 20 U/L (ref 0–44)
AST: 20 U/L (ref 15–41)
Albumin: 3.7 g/dL (ref 3.5–5.0)
Alkaline Phosphatase: 61 U/L (ref 38–126)
Anion gap: 10 (ref 5–15)
BUN: 26 mg/dL — ABNORMAL HIGH (ref 6–20)
CO2: 19 mmol/L — ABNORMAL LOW (ref 22–32)
Calcium: 9.2 mg/dL (ref 8.9–10.3)
Chloride: 111 mmol/L (ref 98–111)
Creatinine: 0.92 mg/dL (ref 0.44–1.00)
GFR, Estimated: >60 mL/min (ref >60)
Glucose, Bld: 136 mg/dL — ABNORMAL HIGH (ref 70–99)
Potassium: 4.2 mmol/L (ref 3.5–5.1)
Sodium: 140 mmol/L (ref 135–145)
Total Bilirubin: 0.5 mg/dL (ref 0.3–1.2)
Total Protein: 7.5 g/dL (ref 6.5–8.1)

## 2022-02-03 LAB — FERRITIN: Ferritin: 20 ng/mL (ref 11–307)

## 2022-02-03 LAB — IRON AND TIBC
Iron: 63 ug/dL (ref 28–170)
Saturation Ratios: 20 % (ref 10.4–31.8)
TIBC: 319 ug/dL (ref 250–450)
UIBC: 256 ug/dL

## 2022-02-05 ENCOUNTER — Inpatient Hospital Stay (INDEPENDENT_AMBULATORY_CARE_PROVIDER_SITE_OTHER): Payer: Medicare Other | Admitting: Oncology

## 2022-02-05 ENCOUNTER — Encounter: Payer: Self-pay | Admitting: Oncology

## 2022-02-05 VITALS — BP 103/73 | HR 95 | Temp 98.2°F | Resp 18 | Ht 68.0 in | Wt 219.6 lb

## 2022-02-05 DIAGNOSIS — D500 Iron deficiency anemia secondary to blood loss (chronic): Secondary | ICD-10-CM

## 2022-02-11 ENCOUNTER — Telehealth: Payer: Self-pay

## 2022-02-11 NOTE — Telephone Encounter (Signed)
-----   Message from Derwood Kaplan, MD sent at 02/10/2022 10:18 AM EST ----- Regarding: call Tell her labs look good, hgb 12.1 and iron levels are okay

## 2022-02-11 NOTE — Telephone Encounter (Signed)
Patient notified of lab results

## 2022-03-10 DIAGNOSIS — E785 Hyperlipidemia, unspecified: Secondary | ICD-10-CM | POA: Diagnosis not present

## 2022-03-10 DIAGNOSIS — E1169 Type 2 diabetes mellitus with other specified complication: Secondary | ICD-10-CM | POA: Diagnosis not present

## 2022-03-10 DIAGNOSIS — R062 Wheezing: Secondary | ICD-10-CM | POA: Diagnosis not present

## 2022-03-10 DIAGNOSIS — J069 Acute upper respiratory infection, unspecified: Secondary | ICD-10-CM | POA: Diagnosis not present

## 2022-03-17 DIAGNOSIS — G35 Multiple sclerosis: Secondary | ICD-10-CM | POA: Diagnosis not present

## 2022-03-17 DIAGNOSIS — M47816 Spondylosis without myelopathy or radiculopathy, lumbar region: Secondary | ICD-10-CM | POA: Diagnosis not present

## 2022-03-17 DIAGNOSIS — R4702 Dysphasia: Secondary | ICD-10-CM | POA: Diagnosis not present

## 2022-03-17 DIAGNOSIS — M5416 Radiculopathy, lumbar region: Secondary | ICD-10-CM | POA: Diagnosis not present

## 2022-03-17 DIAGNOSIS — Z1389 Encounter for screening for other disorder: Secondary | ICD-10-CM | POA: Diagnosis not present

## 2022-03-17 DIAGNOSIS — M5136 Other intervertebral disc degeneration, lumbar region: Secondary | ICD-10-CM | POA: Diagnosis not present

## 2022-03-17 DIAGNOSIS — R519 Headache, unspecified: Secondary | ICD-10-CM | POA: Diagnosis not present

## 2022-03-17 DIAGNOSIS — M329 Systemic lupus erythematosus, unspecified: Secondary | ICD-10-CM | POA: Diagnosis not present

## 2022-03-25 DIAGNOSIS — E118 Type 2 diabetes mellitus with unspecified complications: Secondary | ICD-10-CM | POA: Diagnosis not present

## 2022-04-15 DIAGNOSIS — Z1389 Encounter for screening for other disorder: Secondary | ICD-10-CM | POA: Diagnosis not present

## 2022-04-15 DIAGNOSIS — M329 Systemic lupus erythematosus, unspecified: Secondary | ICD-10-CM | POA: Diagnosis not present

## 2022-04-15 DIAGNOSIS — R4702 Dysphasia: Secondary | ICD-10-CM | POA: Diagnosis not present

## 2022-04-15 DIAGNOSIS — R519 Headache, unspecified: Secondary | ICD-10-CM | POA: Diagnosis not present

## 2022-04-15 DIAGNOSIS — G35 Multiple sclerosis: Secondary | ICD-10-CM | POA: Diagnosis not present

## 2022-04-15 DIAGNOSIS — M5136 Other intervertebral disc degeneration, lumbar region: Secondary | ICD-10-CM | POA: Diagnosis not present

## 2022-04-15 DIAGNOSIS — M5416 Radiculopathy, lumbar region: Secondary | ICD-10-CM | POA: Diagnosis not present

## 2022-04-15 DIAGNOSIS — M47816 Spondylosis without myelopathy or radiculopathy, lumbar region: Secondary | ICD-10-CM | POA: Diagnosis not present

## 2022-04-24 DIAGNOSIS — E118 Type 2 diabetes mellitus with unspecified complications: Secondary | ICD-10-CM | POA: Diagnosis not present

## 2022-05-11 DIAGNOSIS — Z5112 Encounter for antineoplastic immunotherapy: Secondary | ICD-10-CM | POA: Diagnosis not present

## 2022-05-11 DIAGNOSIS — M05741 Rheumatoid arthritis with rheumatoid factor of right hand without organ or systems involvement: Secondary | ICD-10-CM | POA: Diagnosis not present

## 2022-05-11 DIAGNOSIS — M05742 Rheumatoid arthritis with rheumatoid factor of left hand without organ or systems involvement: Secondary | ICD-10-CM | POA: Diagnosis not present

## 2022-05-12 DIAGNOSIS — M5416 Radiculopathy, lumbar region: Secondary | ICD-10-CM | POA: Diagnosis not present

## 2022-05-12 DIAGNOSIS — M47816 Spondylosis without myelopathy or radiculopathy, lumbar region: Secondary | ICD-10-CM | POA: Diagnosis not present

## 2022-05-12 DIAGNOSIS — G894 Chronic pain syndrome: Secondary | ICD-10-CM | POA: Diagnosis not present

## 2022-05-12 DIAGNOSIS — R4702 Dysphasia: Secondary | ICD-10-CM | POA: Diagnosis not present

## 2022-05-12 DIAGNOSIS — M5136 Other intervertebral disc degeneration, lumbar region: Secondary | ICD-10-CM | POA: Diagnosis not present

## 2022-05-12 DIAGNOSIS — R519 Headache, unspecified: Secondary | ICD-10-CM | POA: Diagnosis not present

## 2022-05-12 DIAGNOSIS — Z1389 Encounter for screening for other disorder: Secondary | ICD-10-CM | POA: Diagnosis not present

## 2022-05-12 DIAGNOSIS — G35 Multiple sclerosis: Secondary | ICD-10-CM | POA: Diagnosis not present

## 2022-05-12 DIAGNOSIS — Z79891 Long term (current) use of opiate analgesic: Secondary | ICD-10-CM | POA: Diagnosis not present

## 2022-05-12 DIAGNOSIS — M329 Systemic lupus erythematosus, unspecified: Secondary | ICD-10-CM | POA: Diagnosis not present

## 2022-05-24 DIAGNOSIS — E118 Type 2 diabetes mellitus with unspecified complications: Secondary | ICD-10-CM | POA: Diagnosis not present

## 2022-05-27 DIAGNOSIS — G43709 Chronic migraine without aura, not intractable, without status migrainosus: Secondary | ICD-10-CM | POA: Diagnosis not present

## 2022-05-27 DIAGNOSIS — R569 Unspecified convulsions: Secondary | ICD-10-CM | POA: Diagnosis not present

## 2022-06-09 DIAGNOSIS — M5136 Other intervertebral disc degeneration, lumbar region: Secondary | ICD-10-CM | POA: Diagnosis not present

## 2022-06-09 DIAGNOSIS — M329 Systemic lupus erythematosus, unspecified: Secondary | ICD-10-CM | POA: Diagnosis not present

## 2022-06-09 DIAGNOSIS — G894 Chronic pain syndrome: Secondary | ICD-10-CM | POA: Diagnosis not present

## 2022-06-09 DIAGNOSIS — M5416 Radiculopathy, lumbar region: Secondary | ICD-10-CM | POA: Diagnosis not present

## 2022-06-09 DIAGNOSIS — R519 Headache, unspecified: Secondary | ICD-10-CM | POA: Diagnosis not present

## 2022-06-09 DIAGNOSIS — Z1389 Encounter for screening for other disorder: Secondary | ICD-10-CM | POA: Diagnosis not present

## 2022-06-09 DIAGNOSIS — G35 Multiple sclerosis: Secondary | ICD-10-CM | POA: Diagnosis not present

## 2022-06-09 DIAGNOSIS — M47816 Spondylosis without myelopathy or radiculopathy, lumbar region: Secondary | ICD-10-CM | POA: Diagnosis not present

## 2022-06-09 DIAGNOSIS — R4702 Dysphasia: Secondary | ICD-10-CM | POA: Diagnosis not present

## 2022-06-19 NOTE — Telephone Encounter (Signed)
done

## 2022-07-08 DIAGNOSIS — M05742 Rheumatoid arthritis with rheumatoid factor of left hand without organ or systems involvement: Secondary | ICD-10-CM | POA: Diagnosis not present

## 2022-07-08 DIAGNOSIS — M05741 Rheumatoid arthritis with rheumatoid factor of right hand without organ or systems involvement: Secondary | ICD-10-CM | POA: Diagnosis not present

## 2022-07-09 DIAGNOSIS — G35 Multiple sclerosis: Secondary | ICD-10-CM | POA: Diagnosis not present

## 2022-07-09 DIAGNOSIS — R4702 Dysphasia: Secondary | ICD-10-CM | POA: Diagnosis not present

## 2022-07-09 DIAGNOSIS — M47816 Spondylosis without myelopathy or radiculopathy, lumbar region: Secondary | ICD-10-CM | POA: Diagnosis not present

## 2022-07-09 DIAGNOSIS — M5416 Radiculopathy, lumbar region: Secondary | ICD-10-CM | POA: Diagnosis not present

## 2022-07-09 DIAGNOSIS — Z1389 Encounter for screening for other disorder: Secondary | ICD-10-CM | POA: Diagnosis not present

## 2022-07-09 DIAGNOSIS — R519 Headache, unspecified: Secondary | ICD-10-CM | POA: Diagnosis not present

## 2022-07-09 DIAGNOSIS — M329 Systemic lupus erythematosus, unspecified: Secondary | ICD-10-CM | POA: Diagnosis not present

## 2022-07-09 DIAGNOSIS — M5136 Other intervertebral disc degeneration, lumbar region: Secondary | ICD-10-CM | POA: Diagnosis not present

## 2022-07-16 DIAGNOSIS — Z9181 History of falling: Secondary | ICD-10-CM | POA: Diagnosis not present

## 2022-07-16 DIAGNOSIS — Z0000 Encounter for general adult medical examination without abnormal findings: Secondary | ICD-10-CM | POA: Diagnosis not present

## 2022-08-04 DIAGNOSIS — M329 Systemic lupus erythematosus, unspecified: Secondary | ICD-10-CM | POA: Diagnosis not present

## 2022-08-04 DIAGNOSIS — G35 Multiple sclerosis: Secondary | ICD-10-CM | POA: Diagnosis not present

## 2022-08-04 DIAGNOSIS — M5136 Other intervertebral disc degeneration, lumbar region: Secondary | ICD-10-CM | POA: Diagnosis not present

## 2022-08-04 DIAGNOSIS — M47816 Spondylosis without myelopathy or radiculopathy, lumbar region: Secondary | ICD-10-CM | POA: Diagnosis not present

## 2022-08-04 DIAGNOSIS — R519 Headache, unspecified: Secondary | ICD-10-CM | POA: Diagnosis not present

## 2022-08-04 DIAGNOSIS — R4702 Dysphasia: Secondary | ICD-10-CM | POA: Diagnosis not present

## 2022-08-04 DIAGNOSIS — Z1389 Encounter for screening for other disorder: Secondary | ICD-10-CM | POA: Diagnosis not present

## 2022-08-04 DIAGNOSIS — M5416 Radiculopathy, lumbar region: Secondary | ICD-10-CM | POA: Diagnosis not present

## 2022-08-07 DIAGNOSIS — R739 Hyperglycemia, unspecified: Secondary | ICD-10-CM | POA: Diagnosis not present

## 2022-08-07 DIAGNOSIS — R109 Unspecified abdominal pain: Secondary | ICD-10-CM | POA: Diagnosis not present

## 2022-08-07 DIAGNOSIS — R110 Nausea: Secondary | ICD-10-CM | POA: Diagnosis not present

## 2022-08-07 DIAGNOSIS — E538 Deficiency of other specified B group vitamins: Secondary | ICD-10-CM | POA: Diagnosis not present

## 2022-08-07 DIAGNOSIS — E1169 Type 2 diabetes mellitus with other specified complication: Secondary | ICD-10-CM | POA: Diagnosis not present

## 2022-08-07 DIAGNOSIS — Z79899 Other long term (current) drug therapy: Secondary | ICD-10-CM | POA: Diagnosis not present

## 2022-08-07 DIAGNOSIS — E785 Hyperlipidemia, unspecified: Secondary | ICD-10-CM | POA: Diagnosis not present

## 2022-09-02 DIAGNOSIS — M05742 Rheumatoid arthritis with rheumatoid factor of left hand without organ or systems involvement: Secondary | ICD-10-CM | POA: Diagnosis not present

## 2022-09-02 DIAGNOSIS — M05741 Rheumatoid arthritis with rheumatoid factor of right hand without organ or systems involvement: Secondary | ICD-10-CM | POA: Diagnosis not present

## 2022-09-08 DIAGNOSIS — M329 Systemic lupus erythematosus, unspecified: Secondary | ICD-10-CM | POA: Diagnosis not present

## 2022-09-08 DIAGNOSIS — M47816 Spondylosis without myelopathy or radiculopathy, lumbar region: Secondary | ICD-10-CM | POA: Diagnosis not present

## 2022-09-08 DIAGNOSIS — R519 Headache, unspecified: Secondary | ICD-10-CM | POA: Diagnosis not present

## 2022-09-08 DIAGNOSIS — R4702 Dysphasia: Secondary | ICD-10-CM | POA: Diagnosis not present

## 2022-09-08 DIAGNOSIS — Z1389 Encounter for screening for other disorder: Secondary | ICD-10-CM | POA: Diagnosis not present

## 2022-09-08 DIAGNOSIS — G894 Chronic pain syndrome: Secondary | ICD-10-CM | POA: Diagnosis not present

## 2022-09-08 DIAGNOSIS — M5416 Radiculopathy, lumbar region: Secondary | ICD-10-CM | POA: Diagnosis not present

## 2022-09-08 DIAGNOSIS — M5136 Other intervertebral disc degeneration, lumbar region: Secondary | ICD-10-CM | POA: Diagnosis not present

## 2022-09-08 DIAGNOSIS — Z79891 Long term (current) use of opiate analgesic: Secondary | ICD-10-CM | POA: Diagnosis not present

## 2022-09-08 DIAGNOSIS — G35 Multiple sclerosis: Secondary | ICD-10-CM | POA: Diagnosis not present

## 2022-09-29 DIAGNOSIS — G4733 Obstructive sleep apnea (adult) (pediatric): Secondary | ICD-10-CM | POA: Diagnosis not present

## 2022-09-29 DIAGNOSIS — G43709 Chronic migraine without aura, not intractable, without status migrainosus: Secondary | ICD-10-CM | POA: Diagnosis not present

## 2022-09-29 DIAGNOSIS — Z79899 Other long term (current) drug therapy: Secondary | ICD-10-CM | POA: Diagnosis not present

## 2022-09-29 DIAGNOSIS — R569 Unspecified convulsions: Secondary | ICD-10-CM | POA: Diagnosis not present

## 2022-10-13 DIAGNOSIS — Z1389 Encounter for screening for other disorder: Secondary | ICD-10-CM | POA: Diagnosis not present

## 2022-10-13 DIAGNOSIS — M329 Systemic lupus erythematosus, unspecified: Secondary | ICD-10-CM | POA: Diagnosis not present

## 2022-10-13 DIAGNOSIS — R519 Headache, unspecified: Secondary | ICD-10-CM | POA: Diagnosis not present

## 2022-10-13 DIAGNOSIS — R4702 Dysphasia: Secondary | ICD-10-CM | POA: Diagnosis not present

## 2022-10-13 DIAGNOSIS — M5416 Radiculopathy, lumbar region: Secondary | ICD-10-CM | POA: Diagnosis not present

## 2022-10-13 DIAGNOSIS — M5136 Other intervertebral disc degeneration, lumbar region: Secondary | ICD-10-CM | POA: Diagnosis not present

## 2022-10-13 DIAGNOSIS — M47816 Spondylosis without myelopathy or radiculopathy, lumbar region: Secondary | ICD-10-CM | POA: Diagnosis not present

## 2022-10-13 DIAGNOSIS — G35 Multiple sclerosis: Secondary | ICD-10-CM | POA: Diagnosis not present

## 2022-11-10 DIAGNOSIS — Z1389 Encounter for screening for other disorder: Secondary | ICD-10-CM | POA: Diagnosis not present

## 2022-11-10 DIAGNOSIS — G894 Chronic pain syndrome: Secondary | ICD-10-CM | POA: Diagnosis not present

## 2022-11-10 DIAGNOSIS — M329 Systemic lupus erythematosus, unspecified: Secondary | ICD-10-CM | POA: Diagnosis not present

## 2022-11-10 DIAGNOSIS — M47816 Spondylosis without myelopathy or radiculopathy, lumbar region: Secondary | ICD-10-CM | POA: Diagnosis not present

## 2022-11-10 DIAGNOSIS — R4702 Dysphasia: Secondary | ICD-10-CM | POA: Diagnosis not present

## 2022-11-10 DIAGNOSIS — M5416 Radiculopathy, lumbar region: Secondary | ICD-10-CM | POA: Diagnosis not present

## 2022-11-10 DIAGNOSIS — G35 Multiple sclerosis: Secondary | ICD-10-CM | POA: Diagnosis not present

## 2022-11-10 DIAGNOSIS — M5136 Other intervertebral disc degeneration, lumbar region: Secondary | ICD-10-CM | POA: Diagnosis not present

## 2022-11-10 DIAGNOSIS — R519 Headache, unspecified: Secondary | ICD-10-CM | POA: Diagnosis not present

## 2022-11-30 DIAGNOSIS — W19XXXA Unspecified fall, initial encounter: Secondary | ICD-10-CM | POA: Diagnosis not present

## 2022-11-30 DIAGNOSIS — J45909 Unspecified asthma, uncomplicated: Secondary | ICD-10-CM | POA: Diagnosis not present

## 2022-11-30 DIAGNOSIS — R296 Repeated falls: Secondary | ICD-10-CM | POA: Diagnosis not present

## 2022-11-30 DIAGNOSIS — G43909 Migraine, unspecified, not intractable, without status migrainosus: Secondary | ICD-10-CM | POA: Diagnosis not present

## 2022-11-30 DIAGNOSIS — R0781 Pleurodynia: Secondary | ICD-10-CM | POA: Diagnosis not present

## 2022-11-30 DIAGNOSIS — S06300A Unspecified focal traumatic brain injury without loss of consciousness, initial encounter: Secondary | ICD-10-CM | POA: Diagnosis not present

## 2022-11-30 DIAGNOSIS — M6281 Muscle weakness (generalized): Secondary | ICD-10-CM | POA: Diagnosis not present

## 2022-11-30 DIAGNOSIS — I440 Atrioventricular block, first degree: Secondary | ICD-10-CM | POA: Diagnosis not present

## 2022-11-30 DIAGNOSIS — E785 Hyperlipidemia, unspecified: Secondary | ICD-10-CM | POA: Diagnosis not present

## 2022-11-30 DIAGNOSIS — Z79891 Long term (current) use of opiate analgesic: Secondary | ICD-10-CM | POA: Diagnosis not present

## 2022-11-30 DIAGNOSIS — R29898 Other symptoms and signs involving the musculoskeletal system: Secondary | ICD-10-CM | POA: Diagnosis not present

## 2022-11-30 DIAGNOSIS — M797 Fibromyalgia: Secondary | ICD-10-CM | POA: Diagnosis not present

## 2022-11-30 DIAGNOSIS — Z7982 Long term (current) use of aspirin: Secondary | ICD-10-CM | POA: Diagnosis not present

## 2022-11-30 DIAGNOSIS — R1311 Dysphagia, oral phase: Secondary | ICD-10-CM | POA: Diagnosis not present

## 2022-11-30 DIAGNOSIS — M329 Systemic lupus erythematosus, unspecified: Secondary | ICD-10-CM | POA: Diagnosis not present

## 2022-11-30 DIAGNOSIS — E1140 Type 2 diabetes mellitus with diabetic neuropathy, unspecified: Secondary | ICD-10-CM | POA: Diagnosis not present

## 2022-11-30 DIAGNOSIS — I951 Orthostatic hypotension: Secondary | ICD-10-CM | POA: Diagnosis not present

## 2022-11-30 DIAGNOSIS — J9811 Atelectasis: Secondary | ICD-10-CM | POA: Diagnosis not present

## 2022-11-30 DIAGNOSIS — M1990 Unspecified osteoarthritis, unspecified site: Secondary | ICD-10-CM | POA: Diagnosis not present

## 2022-11-30 DIAGNOSIS — I69398 Other sequelae of cerebral infarction: Secondary | ICD-10-CM | POA: Diagnosis not present

## 2022-11-30 DIAGNOSIS — G35 Multiple sclerosis: Secondary | ICD-10-CM | POA: Diagnosis not present

## 2022-11-30 DIAGNOSIS — M069 Rheumatoid arthritis, unspecified: Secondary | ICD-10-CM | POA: Diagnosis not present

## 2022-11-30 DIAGNOSIS — R2689 Other abnormalities of gait and mobility: Secondary | ICD-10-CM | POA: Diagnosis not present

## 2022-11-30 DIAGNOSIS — Z885 Allergy status to narcotic agent status: Secondary | ICD-10-CM | POA: Diagnosis not present

## 2022-11-30 DIAGNOSIS — M545 Low back pain, unspecified: Secondary | ICD-10-CM | POA: Diagnosis not present

## 2022-11-30 DIAGNOSIS — R531 Weakness: Secondary | ICD-10-CM | POA: Diagnosis not present

## 2022-11-30 DIAGNOSIS — I69351 Hemiplegia and hemiparesis following cerebral infarction affecting right dominant side: Secondary | ICD-10-CM | POA: Diagnosis not present

## 2022-11-30 DIAGNOSIS — M0600 Rheumatoid arthritis without rheumatoid factor, unspecified site: Secondary | ICD-10-CM | POA: Diagnosis not present

## 2022-11-30 DIAGNOSIS — Z794 Long term (current) use of insulin: Secondary | ICD-10-CM | POA: Diagnosis not present

## 2022-11-30 DIAGNOSIS — Z91010 Allergy to peanuts: Secondary | ICD-10-CM | POA: Diagnosis not present

## 2022-11-30 DIAGNOSIS — Z043 Encounter for examination and observation following other accident: Secondary | ICD-10-CM | POA: Diagnosis not present

## 2022-11-30 DIAGNOSIS — Z7401 Bed confinement status: Secondary | ICD-10-CM | POA: Diagnosis not present

## 2022-11-30 DIAGNOSIS — I771 Stricture of artery: Secondary | ICD-10-CM | POA: Diagnosis not present

## 2022-11-30 DIAGNOSIS — S199XXA Unspecified injury of neck, initial encounter: Secondary | ICD-10-CM | POA: Diagnosis not present

## 2022-11-30 DIAGNOSIS — Z880 Allergy status to penicillin: Secondary | ICD-10-CM | POA: Diagnosis not present

## 2022-11-30 DIAGNOSIS — S061XAA Traumatic cerebral edema with loss of consciousness status unknown, initial encounter: Secondary | ICD-10-CM | POA: Diagnosis not present

## 2022-11-30 DIAGNOSIS — I10 Essential (primary) hypertension: Secondary | ICD-10-CM | POA: Diagnosis not present

## 2022-11-30 DIAGNOSIS — K580 Irritable bowel syndrome with diarrhea: Secondary | ICD-10-CM | POA: Diagnosis not present

## 2022-11-30 DIAGNOSIS — G936 Cerebral edema: Secondary | ICD-10-CM | POA: Diagnosis not present

## 2022-11-30 DIAGNOSIS — G894 Chronic pain syndrome: Secondary | ICD-10-CM | POA: Diagnosis not present

## 2022-11-30 DIAGNOSIS — T40601A Poisoning by unspecified narcotics, accidental (unintentional), initial encounter: Secondary | ICD-10-CM | POA: Diagnosis not present

## 2022-11-30 DIAGNOSIS — J40 Bronchitis, not specified as acute or chronic: Secondary | ICD-10-CM | POA: Diagnosis not present

## 2022-11-30 DIAGNOSIS — E119 Type 2 diabetes mellitus without complications: Secondary | ICD-10-CM | POA: Diagnosis not present

## 2022-11-30 DIAGNOSIS — K219 Gastro-esophageal reflux disease without esophagitis: Secondary | ICD-10-CM | POA: Diagnosis not present

## 2022-11-30 DIAGNOSIS — G928 Other toxic encephalopathy: Secondary | ICD-10-CM | POA: Diagnosis not present

## 2022-11-30 DIAGNOSIS — I639 Cerebral infarction, unspecified: Secondary | ICD-10-CM | POA: Diagnosis not present

## 2022-11-30 DIAGNOSIS — Z741 Need for assistance with personal care: Secondary | ICD-10-CM | POA: Diagnosis not present

## 2022-11-30 DIAGNOSIS — S0083XA Contusion of other part of head, initial encounter: Secondary | ICD-10-CM | POA: Diagnosis not present

## 2022-11-30 DIAGNOSIS — I69391 Dysphagia following cerebral infarction: Secondary | ICD-10-CM | POA: Diagnosis not present

## 2022-11-30 DIAGNOSIS — I629 Nontraumatic intracranial hemorrhage, unspecified: Secondary | ICD-10-CM | POA: Diagnosis not present

## 2022-11-30 DIAGNOSIS — Z91040 Latex allergy status: Secondary | ICD-10-CM | POA: Diagnosis not present

## 2022-12-02 DIAGNOSIS — I440 Atrioventricular block, first degree: Secondary | ICD-10-CM | POA: Diagnosis not present

## 2022-12-03 DIAGNOSIS — Z5181 Encounter for therapeutic drug level monitoring: Secondary | ICD-10-CM | POA: Diagnosis not present

## 2022-12-03 DIAGNOSIS — M79604 Pain in right leg: Secondary | ICD-10-CM | POA: Diagnosis not present

## 2022-12-03 DIAGNOSIS — Z7401 Bed confinement status: Secondary | ICD-10-CM | POA: Diagnosis not present

## 2022-12-03 DIAGNOSIS — J9811 Atelectasis: Secondary | ICD-10-CM | POA: Diagnosis not present

## 2022-12-03 DIAGNOSIS — W19XXXA Unspecified fall, initial encounter: Secondary | ICD-10-CM | POA: Diagnosis not present

## 2022-12-03 DIAGNOSIS — M25551 Pain in right hip: Secondary | ICD-10-CM | POA: Diagnosis not present

## 2022-12-03 DIAGNOSIS — R0781 Pleurodynia: Secondary | ICD-10-CM | POA: Diagnosis not present

## 2022-12-03 DIAGNOSIS — R29898 Other symptoms and signs involving the musculoskeletal system: Secondary | ICD-10-CM | POA: Diagnosis not present

## 2022-12-03 DIAGNOSIS — S0093XD Contusion of unspecified part of head, subsequent encounter: Secondary | ICD-10-CM | POA: Diagnosis not present

## 2022-12-03 DIAGNOSIS — M6281 Muscle weakness (generalized): Secondary | ICD-10-CM | POA: Diagnosis not present

## 2022-12-03 DIAGNOSIS — R2231 Localized swelling, mass and lump, right upper limb: Secondary | ICD-10-CM | POA: Diagnosis not present

## 2022-12-03 DIAGNOSIS — M19041 Primary osteoarthritis, right hand: Secondary | ICD-10-CM | POA: Diagnosis not present

## 2022-12-03 DIAGNOSIS — Z1389 Encounter for screening for other disorder: Secondary | ICD-10-CM | POA: Diagnosis not present

## 2022-12-03 DIAGNOSIS — I69351 Hemiplegia and hemiparesis following cerebral infarction affecting right dominant side: Secondary | ICD-10-CM | POA: Diagnosis not present

## 2022-12-03 DIAGNOSIS — R2689 Other abnormalities of gait and mobility: Secondary | ICD-10-CM | POA: Diagnosis not present

## 2022-12-03 DIAGNOSIS — I10 Essential (primary) hypertension: Secondary | ICD-10-CM | POA: Diagnosis not present

## 2022-12-03 DIAGNOSIS — I69398 Other sequelae of cerebral infarction: Secondary | ICD-10-CM | POA: Diagnosis not present

## 2022-12-03 DIAGNOSIS — G43909 Migraine, unspecified, not intractable, without status migrainosus: Secondary | ICD-10-CM | POA: Diagnosis not present

## 2022-12-03 DIAGNOSIS — Z79891 Long term (current) use of opiate analgesic: Secondary | ICD-10-CM | POA: Diagnosis not present

## 2022-12-03 DIAGNOSIS — I629 Nontraumatic intracranial hemorrhage, unspecified: Secondary | ICD-10-CM | POA: Diagnosis not present

## 2022-12-03 DIAGNOSIS — Z743 Need for continuous supervision: Secondary | ICD-10-CM | POA: Diagnosis not present

## 2022-12-03 DIAGNOSIS — M0600 Rheumatoid arthritis without rheumatoid factor, unspecified site: Secondary | ICD-10-CM | POA: Diagnosis not present

## 2022-12-03 DIAGNOSIS — R1311 Dysphagia, oral phase: Secondary | ICD-10-CM | POA: Diagnosis not present

## 2022-12-03 DIAGNOSIS — G35 Multiple sclerosis: Secondary | ICD-10-CM | POA: Diagnosis not present

## 2022-12-03 DIAGNOSIS — M25572 Pain in left ankle and joints of left foot: Secondary | ICD-10-CM | POA: Diagnosis not present

## 2022-12-03 DIAGNOSIS — M329 Systemic lupus erythematosus, unspecified: Secondary | ICD-10-CM | POA: Diagnosis not present

## 2022-12-03 DIAGNOSIS — E119 Type 2 diabetes mellitus without complications: Secondary | ICD-10-CM | POA: Diagnosis not present

## 2022-12-03 DIAGNOSIS — Z741 Need for assistance with personal care: Secondary | ICD-10-CM | POA: Diagnosis not present

## 2022-12-03 DIAGNOSIS — E039 Hypothyroidism, unspecified: Secondary | ICD-10-CM | POA: Diagnosis not present

## 2022-12-03 DIAGNOSIS — Z7189 Other specified counseling: Secondary | ICD-10-CM | POA: Diagnosis not present

## 2022-12-03 DIAGNOSIS — M25561 Pain in right knee: Secondary | ICD-10-CM | POA: Diagnosis not present

## 2022-12-03 DIAGNOSIS — R42 Dizziness and giddiness: Secondary | ICD-10-CM | POA: Diagnosis not present

## 2022-12-03 DIAGNOSIS — M47816 Spondylosis without myelopathy or radiculopathy, lumbar region: Secondary | ICD-10-CM | POA: Diagnosis not present

## 2022-12-03 DIAGNOSIS — G894 Chronic pain syndrome: Secondary | ICD-10-CM | POA: Diagnosis not present

## 2022-12-03 DIAGNOSIS — I771 Stricture of artery: Secondary | ICD-10-CM | POA: Diagnosis not present

## 2022-12-03 DIAGNOSIS — J40 Bronchitis, not specified as acute or chronic: Secondary | ICD-10-CM | POA: Diagnosis not present

## 2022-12-03 DIAGNOSIS — I639 Cerebral infarction, unspecified: Secondary | ICD-10-CM | POA: Diagnosis not present

## 2022-12-03 DIAGNOSIS — R55 Syncope and collapse: Secondary | ICD-10-CM | POA: Diagnosis not present

## 2022-12-03 DIAGNOSIS — M5416 Radiculopathy, lumbar region: Secondary | ICD-10-CM | POA: Diagnosis not present

## 2022-12-03 DIAGNOSIS — S0990XA Unspecified injury of head, initial encounter: Secondary | ICD-10-CM | POA: Diagnosis not present

## 2022-12-03 DIAGNOSIS — R519 Headache, unspecified: Secondary | ICD-10-CM | POA: Diagnosis not present

## 2022-12-03 DIAGNOSIS — M545 Low back pain, unspecified: Secondary | ICD-10-CM | POA: Diagnosis not present

## 2022-12-03 DIAGNOSIS — M25461 Effusion, right knee: Secondary | ICD-10-CM | POA: Diagnosis not present

## 2022-12-03 DIAGNOSIS — K5090 Crohn's disease, unspecified, without complications: Secondary | ICD-10-CM | POA: Diagnosis not present

## 2022-12-03 DIAGNOSIS — I69391 Dysphagia following cerebral infarction: Secondary | ICD-10-CM | POA: Diagnosis not present

## 2022-12-03 DIAGNOSIS — R531 Weakness: Secondary | ICD-10-CM | POA: Diagnosis not present

## 2022-12-03 DIAGNOSIS — S7001XA Contusion of right hip, initial encounter: Secondary | ICD-10-CM | POA: Diagnosis not present

## 2022-12-03 DIAGNOSIS — Z794 Long term (current) use of insulin: Secondary | ICD-10-CM | POA: Diagnosis not present

## 2022-12-03 DIAGNOSIS — E1140 Type 2 diabetes mellitus with diabetic neuropathy, unspecified: Secondary | ICD-10-CM | POA: Diagnosis not present

## 2022-12-03 DIAGNOSIS — K219 Gastro-esophageal reflux disease without esophagitis: Secondary | ICD-10-CM | POA: Diagnosis not present

## 2022-12-03 DIAGNOSIS — E785 Hyperlipidemia, unspecified: Secondary | ICD-10-CM | POA: Diagnosis not present

## 2022-12-03 DIAGNOSIS — G936 Cerebral edema: Secondary | ICD-10-CM | POA: Diagnosis not present

## 2022-12-03 DIAGNOSIS — M797 Fibromyalgia: Secondary | ICD-10-CM | POA: Diagnosis not present

## 2022-12-03 DIAGNOSIS — R4702 Dysphasia: Secondary | ICD-10-CM | POA: Diagnosis not present

## 2022-12-04 DIAGNOSIS — E119 Type 2 diabetes mellitus without complications: Secondary | ICD-10-CM | POA: Diagnosis not present

## 2022-12-04 DIAGNOSIS — R55 Syncope and collapse: Secondary | ICD-10-CM | POA: Diagnosis not present

## 2022-12-04 DIAGNOSIS — G35 Multiple sclerosis: Secondary | ICD-10-CM | POA: Diagnosis not present

## 2022-12-04 DIAGNOSIS — I639 Cerebral infarction, unspecified: Secondary | ICD-10-CM | POA: Diagnosis not present

## 2022-12-07 DIAGNOSIS — I639 Cerebral infarction, unspecified: Secondary | ICD-10-CM | POA: Diagnosis not present

## 2022-12-07 DIAGNOSIS — S0093XD Contusion of unspecified part of head, subsequent encounter: Secondary | ICD-10-CM | POA: Diagnosis not present

## 2022-12-07 DIAGNOSIS — R55 Syncope and collapse: Secondary | ICD-10-CM | POA: Diagnosis not present

## 2022-12-08 DIAGNOSIS — E119 Type 2 diabetes mellitus without complications: Secondary | ICD-10-CM | POA: Diagnosis not present

## 2022-12-08 DIAGNOSIS — Z7189 Other specified counseling: Secondary | ICD-10-CM | POA: Diagnosis not present

## 2022-12-08 DIAGNOSIS — R519 Headache, unspecified: Secondary | ICD-10-CM | POA: Diagnosis not present

## 2022-12-08 DIAGNOSIS — Z5181 Encounter for therapeutic drug level monitoring: Secondary | ICD-10-CM | POA: Diagnosis not present

## 2022-12-08 DIAGNOSIS — G35 Multiple sclerosis: Secondary | ICD-10-CM | POA: Diagnosis not present

## 2022-12-08 DIAGNOSIS — E039 Hypothyroidism, unspecified: Secondary | ICD-10-CM | POA: Diagnosis not present

## 2022-12-08 DIAGNOSIS — Z1389 Encounter for screening for other disorder: Secondary | ICD-10-CM | POA: Diagnosis not present

## 2022-12-08 DIAGNOSIS — G894 Chronic pain syndrome: Secondary | ICD-10-CM | POA: Diagnosis not present

## 2022-12-08 DIAGNOSIS — R4702 Dysphasia: Secondary | ICD-10-CM | POA: Diagnosis not present

## 2022-12-08 DIAGNOSIS — M329 Systemic lupus erythematosus, unspecified: Secondary | ICD-10-CM | POA: Diagnosis not present

## 2022-12-08 DIAGNOSIS — M47816 Spondylosis without myelopathy or radiculopathy, lumbar region: Secondary | ICD-10-CM | POA: Diagnosis not present

## 2022-12-08 DIAGNOSIS — M5416 Radiculopathy, lumbar region: Secondary | ICD-10-CM | POA: Diagnosis not present

## 2022-12-11 DIAGNOSIS — Z5181 Encounter for therapeutic drug level monitoring: Secondary | ICD-10-CM | POA: Diagnosis not present

## 2022-12-11 DIAGNOSIS — K5090 Crohn's disease, unspecified, without complications: Secondary | ICD-10-CM | POA: Diagnosis not present

## 2022-12-14 DIAGNOSIS — R2231 Localized swelling, mass and lump, right upper limb: Secondary | ICD-10-CM | POA: Diagnosis not present

## 2022-12-14 DIAGNOSIS — G35 Multiple sclerosis: Secondary | ICD-10-CM | POA: Diagnosis not present

## 2022-12-14 DIAGNOSIS — I639 Cerebral infarction, unspecified: Secondary | ICD-10-CM | POA: Diagnosis not present

## 2022-12-15 DIAGNOSIS — M19041 Primary osteoarthritis, right hand: Secondary | ICD-10-CM | POA: Diagnosis not present

## 2022-12-16 DIAGNOSIS — M25461 Effusion, right knee: Secondary | ICD-10-CM | POA: Diagnosis not present

## 2022-12-16 DIAGNOSIS — W19XXXA Unspecified fall, initial encounter: Secondary | ICD-10-CM | POA: Diagnosis not present

## 2022-12-16 DIAGNOSIS — M25551 Pain in right hip: Secondary | ICD-10-CM | POA: Diagnosis not present

## 2022-12-16 DIAGNOSIS — R42 Dizziness and giddiness: Secondary | ICD-10-CM | POA: Diagnosis not present

## 2022-12-16 DIAGNOSIS — R531 Weakness: Secondary | ICD-10-CM | POA: Diagnosis not present

## 2022-12-16 DIAGNOSIS — S7001XA Contusion of right hip, initial encounter: Secondary | ICD-10-CM | POA: Diagnosis not present

## 2022-12-16 DIAGNOSIS — M25561 Pain in right knee: Secondary | ICD-10-CM | POA: Diagnosis not present

## 2022-12-16 DIAGNOSIS — M25572 Pain in left ankle and joints of left foot: Secondary | ICD-10-CM | POA: Diagnosis not present

## 2022-12-16 DIAGNOSIS — Z743 Need for continuous supervision: Secondary | ICD-10-CM | POA: Diagnosis not present

## 2022-12-16 DIAGNOSIS — S0990XA Unspecified injury of head, initial encounter: Secondary | ICD-10-CM | POA: Diagnosis not present

## 2022-12-16 DIAGNOSIS — M79604 Pain in right leg: Secondary | ICD-10-CM | POA: Diagnosis not present

## 2022-12-16 DIAGNOSIS — G35 Multiple sclerosis: Secondary | ICD-10-CM | POA: Diagnosis not present

## 2022-12-16 DIAGNOSIS — R55 Syncope and collapse: Secondary | ICD-10-CM | POA: Diagnosis not present

## 2022-12-17 ENCOUNTER — Encounter: Payer: Self-pay | Admitting: Internal Medicine

## 2022-12-25 DIAGNOSIS — G35 Multiple sclerosis: Secondary | ICD-10-CM | POA: Diagnosis not present

## 2022-12-25 DIAGNOSIS — E119 Type 2 diabetes mellitus without complications: Secondary | ICD-10-CM | POA: Diagnosis not present

## 2022-12-25 DIAGNOSIS — R55 Syncope and collapse: Secondary | ICD-10-CM | POA: Diagnosis not present

## 2022-12-25 DIAGNOSIS — I639 Cerebral infarction, unspecified: Secondary | ICD-10-CM | POA: Diagnosis not present

## 2023-01-05 DIAGNOSIS — I639 Cerebral infarction, unspecified: Secondary | ICD-10-CM | POA: Diagnosis not present

## 2023-01-07 DIAGNOSIS — E1122 Type 2 diabetes mellitus with diabetic chronic kidney disease: Secondary | ICD-10-CM | POA: Diagnosis not present

## 2023-01-07 DIAGNOSIS — K5090 Crohn's disease, unspecified, without complications: Secondary | ICD-10-CM | POA: Diagnosis not present

## 2023-01-07 DIAGNOSIS — K219 Gastro-esophageal reflux disease without esophagitis: Secondary | ICD-10-CM | POA: Diagnosis not present

## 2023-01-07 DIAGNOSIS — K5900 Constipation, unspecified: Secondary | ICD-10-CM | POA: Diagnosis not present

## 2023-01-07 DIAGNOSIS — G35 Multiple sclerosis: Secondary | ICD-10-CM | POA: Diagnosis not present

## 2023-01-07 DIAGNOSIS — M329 Systemic lupus erythematosus, unspecified: Secondary | ICD-10-CM | POA: Diagnosis not present

## 2023-01-07 DIAGNOSIS — M1990 Unspecified osteoarthritis, unspecified site: Secondary | ICD-10-CM | POA: Diagnosis not present

## 2023-01-07 DIAGNOSIS — Z791 Long term (current) use of non-steroidal anti-inflammatories (NSAID): Secondary | ICD-10-CM | POA: Diagnosis not present

## 2023-01-07 DIAGNOSIS — Z9181 History of falling: Secondary | ICD-10-CM | POA: Diagnosis not present

## 2023-01-07 DIAGNOSIS — M069 Rheumatoid arthritis, unspecified: Secondary | ICD-10-CM | POA: Diagnosis not present

## 2023-01-07 DIAGNOSIS — I129 Hypertensive chronic kidney disease with stage 1 through stage 4 chronic kidney disease, or unspecified chronic kidney disease: Secondary | ICD-10-CM | POA: Diagnosis not present

## 2023-01-07 DIAGNOSIS — R531 Weakness: Secondary | ICD-10-CM | POA: Diagnosis not present

## 2023-01-07 DIAGNOSIS — I69398 Other sequelae of cerebral infarction: Secondary | ICD-10-CM | POA: Diagnosis not present

## 2023-01-07 DIAGNOSIS — E785 Hyperlipidemia, unspecified: Secondary | ICD-10-CM | POA: Diagnosis not present

## 2023-01-07 DIAGNOSIS — N183 Chronic kidney disease, stage 3 unspecified: Secondary | ICD-10-CM | POA: Diagnosis not present

## 2023-01-07 DIAGNOSIS — G894 Chronic pain syndrome: Secondary | ICD-10-CM | POA: Diagnosis not present

## 2023-01-07 DIAGNOSIS — Z604 Social exclusion and rejection: Secondary | ICD-10-CM | POA: Diagnosis not present

## 2023-01-13 DIAGNOSIS — G35 Multiple sclerosis: Secondary | ICD-10-CM | POA: Diagnosis not present

## 2023-01-13 DIAGNOSIS — R531 Weakness: Secondary | ICD-10-CM | POA: Diagnosis not present

## 2023-01-13 DIAGNOSIS — K219 Gastro-esophageal reflux disease without esophagitis: Secondary | ICD-10-CM | POA: Diagnosis not present

## 2023-01-13 DIAGNOSIS — M329 Systemic lupus erythematosus, unspecified: Secondary | ICD-10-CM | POA: Diagnosis not present

## 2023-01-13 DIAGNOSIS — Z791 Long term (current) use of non-steroidal anti-inflammatories (NSAID): Secondary | ICD-10-CM | POA: Diagnosis not present

## 2023-01-13 DIAGNOSIS — K5900 Constipation, unspecified: Secondary | ICD-10-CM | POA: Diagnosis not present

## 2023-01-13 DIAGNOSIS — E1122 Type 2 diabetes mellitus with diabetic chronic kidney disease: Secondary | ICD-10-CM | POA: Diagnosis not present

## 2023-01-13 DIAGNOSIS — Z9181 History of falling: Secondary | ICD-10-CM | POA: Diagnosis not present

## 2023-01-13 DIAGNOSIS — M069 Rheumatoid arthritis, unspecified: Secondary | ICD-10-CM | POA: Diagnosis not present

## 2023-01-13 DIAGNOSIS — G894 Chronic pain syndrome: Secondary | ICD-10-CM | POA: Diagnosis not present

## 2023-01-13 DIAGNOSIS — N183 Chronic kidney disease, stage 3 unspecified: Secondary | ICD-10-CM | POA: Diagnosis not present

## 2023-01-13 DIAGNOSIS — I69398 Other sequelae of cerebral infarction: Secondary | ICD-10-CM | POA: Diagnosis not present

## 2023-01-13 DIAGNOSIS — M1990 Unspecified osteoarthritis, unspecified site: Secondary | ICD-10-CM | POA: Diagnosis not present

## 2023-01-13 DIAGNOSIS — K5090 Crohn's disease, unspecified, without complications: Secondary | ICD-10-CM | POA: Diagnosis not present

## 2023-01-13 DIAGNOSIS — E785 Hyperlipidemia, unspecified: Secondary | ICD-10-CM | POA: Diagnosis not present

## 2023-01-13 DIAGNOSIS — I129 Hypertensive chronic kidney disease with stage 1 through stage 4 chronic kidney disease, or unspecified chronic kidney disease: Secondary | ICD-10-CM | POA: Diagnosis not present

## 2023-01-13 DIAGNOSIS — Z604 Social exclusion and rejection: Secondary | ICD-10-CM | POA: Diagnosis not present

## 2023-01-18 DIAGNOSIS — R110 Nausea: Secondary | ICD-10-CM | POA: Diagnosis not present

## 2023-01-18 DIAGNOSIS — I10 Essential (primary) hypertension: Secondary | ICD-10-CM | POA: Diagnosis not present

## 2023-01-18 DIAGNOSIS — E1169 Type 2 diabetes mellitus with other specified complication: Secondary | ICD-10-CM | POA: Diagnosis not present

## 2023-01-18 DIAGNOSIS — R930 Abnormal findings on diagnostic imaging of skull and head, not elsewhere classified: Secondary | ICD-10-CM | POA: Diagnosis not present

## 2023-01-18 DIAGNOSIS — G8929 Other chronic pain: Secondary | ICD-10-CM | POA: Diagnosis not present

## 2023-01-18 DIAGNOSIS — K5090 Crohn's disease, unspecified, without complications: Secondary | ICD-10-CM | POA: Diagnosis not present

## 2023-01-18 DIAGNOSIS — K219 Gastro-esophageal reflux disease without esophagitis: Secondary | ICD-10-CM | POA: Diagnosis not present

## 2023-01-19 DIAGNOSIS — Z791 Long term (current) use of non-steroidal anti-inflammatories (NSAID): Secondary | ICD-10-CM | POA: Diagnosis not present

## 2023-01-19 DIAGNOSIS — I129 Hypertensive chronic kidney disease with stage 1 through stage 4 chronic kidney disease, or unspecified chronic kidney disease: Secondary | ICD-10-CM | POA: Diagnosis not present

## 2023-01-19 DIAGNOSIS — K219 Gastro-esophageal reflux disease without esophagitis: Secondary | ICD-10-CM | POA: Diagnosis not present

## 2023-01-19 DIAGNOSIS — N183 Chronic kidney disease, stage 3 unspecified: Secondary | ICD-10-CM | POA: Diagnosis not present

## 2023-01-19 DIAGNOSIS — Z604 Social exclusion and rejection: Secondary | ICD-10-CM | POA: Diagnosis not present

## 2023-01-19 DIAGNOSIS — G894 Chronic pain syndrome: Secondary | ICD-10-CM | POA: Diagnosis not present

## 2023-01-19 DIAGNOSIS — K5900 Constipation, unspecified: Secondary | ICD-10-CM | POA: Diagnosis not present

## 2023-01-19 DIAGNOSIS — G35 Multiple sclerosis: Secondary | ICD-10-CM | POA: Diagnosis not present

## 2023-01-19 DIAGNOSIS — I69398 Other sequelae of cerebral infarction: Secondary | ICD-10-CM | POA: Diagnosis not present

## 2023-01-19 DIAGNOSIS — K5090 Crohn's disease, unspecified, without complications: Secondary | ICD-10-CM | POA: Diagnosis not present

## 2023-01-19 DIAGNOSIS — M069 Rheumatoid arthritis, unspecified: Secondary | ICD-10-CM | POA: Diagnosis not present

## 2023-01-19 DIAGNOSIS — M329 Systemic lupus erythematosus, unspecified: Secondary | ICD-10-CM | POA: Diagnosis not present

## 2023-01-19 DIAGNOSIS — M1990 Unspecified osteoarthritis, unspecified site: Secondary | ICD-10-CM | POA: Diagnosis not present

## 2023-01-19 DIAGNOSIS — Z9181 History of falling: Secondary | ICD-10-CM | POA: Diagnosis not present

## 2023-01-19 DIAGNOSIS — R531 Weakness: Secondary | ICD-10-CM | POA: Diagnosis not present

## 2023-01-19 DIAGNOSIS — E785 Hyperlipidemia, unspecified: Secondary | ICD-10-CM | POA: Diagnosis not present

## 2023-01-19 DIAGNOSIS — E1122 Type 2 diabetes mellitus with diabetic chronic kidney disease: Secondary | ICD-10-CM | POA: Diagnosis not present

## 2023-01-21 DIAGNOSIS — E1122 Type 2 diabetes mellitus with diabetic chronic kidney disease: Secondary | ICD-10-CM | POA: Diagnosis not present

## 2023-01-21 DIAGNOSIS — R531 Weakness: Secondary | ICD-10-CM | POA: Diagnosis not present

## 2023-01-21 DIAGNOSIS — I69398 Other sequelae of cerebral infarction: Secondary | ICD-10-CM | POA: Diagnosis not present

## 2023-01-21 DIAGNOSIS — E785 Hyperlipidemia, unspecified: Secondary | ICD-10-CM | POA: Diagnosis not present

## 2023-01-21 DIAGNOSIS — M1990 Unspecified osteoarthritis, unspecified site: Secondary | ICD-10-CM | POA: Diagnosis not present

## 2023-01-21 DIAGNOSIS — G894 Chronic pain syndrome: Secondary | ICD-10-CM | POA: Diagnosis not present

## 2023-01-21 DIAGNOSIS — M069 Rheumatoid arthritis, unspecified: Secondary | ICD-10-CM | POA: Diagnosis not present

## 2023-01-21 DIAGNOSIS — G35 Multiple sclerosis: Secondary | ICD-10-CM | POA: Diagnosis not present

## 2023-01-21 DIAGNOSIS — Z791 Long term (current) use of non-steroidal anti-inflammatories (NSAID): Secondary | ICD-10-CM | POA: Diagnosis not present

## 2023-01-21 DIAGNOSIS — N183 Chronic kidney disease, stage 3 unspecified: Secondary | ICD-10-CM | POA: Diagnosis not present

## 2023-01-21 DIAGNOSIS — K5090 Crohn's disease, unspecified, without complications: Secondary | ICD-10-CM | POA: Diagnosis not present

## 2023-01-21 DIAGNOSIS — K219 Gastro-esophageal reflux disease without esophagitis: Secondary | ICD-10-CM | POA: Diagnosis not present

## 2023-01-21 DIAGNOSIS — Z9181 History of falling: Secondary | ICD-10-CM | POA: Diagnosis not present

## 2023-01-21 DIAGNOSIS — M329 Systemic lupus erythematosus, unspecified: Secondary | ICD-10-CM | POA: Diagnosis not present

## 2023-01-21 DIAGNOSIS — Z604 Social exclusion and rejection: Secondary | ICD-10-CM | POA: Diagnosis not present

## 2023-01-21 DIAGNOSIS — K5900 Constipation, unspecified: Secondary | ICD-10-CM | POA: Diagnosis not present

## 2023-01-21 DIAGNOSIS — I129 Hypertensive chronic kidney disease with stage 1 through stage 4 chronic kidney disease, or unspecified chronic kidney disease: Secondary | ICD-10-CM | POA: Diagnosis not present

## 2023-01-25 DIAGNOSIS — Z604 Social exclusion and rejection: Secondary | ICD-10-CM | POA: Diagnosis not present

## 2023-01-25 DIAGNOSIS — M1990 Unspecified osteoarthritis, unspecified site: Secondary | ICD-10-CM | POA: Diagnosis not present

## 2023-01-25 DIAGNOSIS — G894 Chronic pain syndrome: Secondary | ICD-10-CM | POA: Diagnosis not present

## 2023-01-25 DIAGNOSIS — E1122 Type 2 diabetes mellitus with diabetic chronic kidney disease: Secondary | ICD-10-CM | POA: Diagnosis not present

## 2023-01-25 DIAGNOSIS — M329 Systemic lupus erythematosus, unspecified: Secondary | ICD-10-CM | POA: Diagnosis not present

## 2023-01-25 DIAGNOSIS — N183 Chronic kidney disease, stage 3 unspecified: Secondary | ICD-10-CM | POA: Diagnosis not present

## 2023-01-25 DIAGNOSIS — K219 Gastro-esophageal reflux disease without esophagitis: Secondary | ICD-10-CM | POA: Diagnosis not present

## 2023-01-25 DIAGNOSIS — E785 Hyperlipidemia, unspecified: Secondary | ICD-10-CM | POA: Diagnosis not present

## 2023-01-25 DIAGNOSIS — M069 Rheumatoid arthritis, unspecified: Secondary | ICD-10-CM | POA: Diagnosis not present

## 2023-01-25 DIAGNOSIS — Z9181 History of falling: Secondary | ICD-10-CM | POA: Diagnosis not present

## 2023-01-25 DIAGNOSIS — I69398 Other sequelae of cerebral infarction: Secondary | ICD-10-CM | POA: Diagnosis not present

## 2023-01-25 DIAGNOSIS — G35 Multiple sclerosis: Secondary | ICD-10-CM | POA: Diagnosis not present

## 2023-01-25 DIAGNOSIS — K5900 Constipation, unspecified: Secondary | ICD-10-CM | POA: Diagnosis not present

## 2023-01-25 DIAGNOSIS — Z791 Long term (current) use of non-steroidal anti-inflammatories (NSAID): Secondary | ICD-10-CM | POA: Diagnosis not present

## 2023-01-25 DIAGNOSIS — I129 Hypertensive chronic kidney disease with stage 1 through stage 4 chronic kidney disease, or unspecified chronic kidney disease: Secondary | ICD-10-CM | POA: Diagnosis not present

## 2023-01-25 DIAGNOSIS — R531 Weakness: Secondary | ICD-10-CM | POA: Diagnosis not present

## 2023-01-25 DIAGNOSIS — K5090 Crohn's disease, unspecified, without complications: Secondary | ICD-10-CM | POA: Diagnosis not present

## 2023-02-01 DIAGNOSIS — R531 Weakness: Secondary | ICD-10-CM | POA: Diagnosis not present

## 2023-02-01 DIAGNOSIS — M329 Systemic lupus erythematosus, unspecified: Secondary | ICD-10-CM | POA: Diagnosis not present

## 2023-02-01 DIAGNOSIS — G894 Chronic pain syndrome: Secondary | ICD-10-CM | POA: Diagnosis not present

## 2023-02-01 DIAGNOSIS — I129 Hypertensive chronic kidney disease with stage 1 through stage 4 chronic kidney disease, or unspecified chronic kidney disease: Secondary | ICD-10-CM | POA: Diagnosis not present

## 2023-02-01 DIAGNOSIS — K219 Gastro-esophageal reflux disease without esophagitis: Secondary | ICD-10-CM | POA: Diagnosis not present

## 2023-02-01 DIAGNOSIS — E785 Hyperlipidemia, unspecified: Secondary | ICD-10-CM | POA: Diagnosis not present

## 2023-02-01 DIAGNOSIS — Z9181 History of falling: Secondary | ICD-10-CM | POA: Diagnosis not present

## 2023-02-01 DIAGNOSIS — K5090 Crohn's disease, unspecified, without complications: Secondary | ICD-10-CM | POA: Diagnosis not present

## 2023-02-01 DIAGNOSIS — E1122 Type 2 diabetes mellitus with diabetic chronic kidney disease: Secondary | ICD-10-CM | POA: Diagnosis not present

## 2023-02-01 DIAGNOSIS — Z604 Social exclusion and rejection: Secondary | ICD-10-CM | POA: Diagnosis not present

## 2023-02-01 DIAGNOSIS — G35 Multiple sclerosis: Secondary | ICD-10-CM | POA: Diagnosis not present

## 2023-02-01 DIAGNOSIS — N183 Chronic kidney disease, stage 3 unspecified: Secondary | ICD-10-CM | POA: Diagnosis not present

## 2023-02-01 DIAGNOSIS — M069 Rheumatoid arthritis, unspecified: Secondary | ICD-10-CM | POA: Diagnosis not present

## 2023-02-01 DIAGNOSIS — M1990 Unspecified osteoarthritis, unspecified site: Secondary | ICD-10-CM | POA: Diagnosis not present

## 2023-02-01 DIAGNOSIS — I69398 Other sequelae of cerebral infarction: Secondary | ICD-10-CM | POA: Diagnosis not present

## 2023-02-01 DIAGNOSIS — Z791 Long term (current) use of non-steroidal anti-inflammatories (NSAID): Secondary | ICD-10-CM | POA: Diagnosis not present

## 2023-02-01 DIAGNOSIS — K5900 Constipation, unspecified: Secondary | ICD-10-CM | POA: Diagnosis not present

## 2023-02-02 DIAGNOSIS — R569 Unspecified convulsions: Secondary | ICD-10-CM | POA: Diagnosis not present

## 2023-02-02 DIAGNOSIS — G43709 Chronic migraine without aura, not intractable, without status migrainosus: Secondary | ICD-10-CM | POA: Diagnosis not present

## 2023-02-04 DIAGNOSIS — K5900 Constipation, unspecified: Secondary | ICD-10-CM | POA: Diagnosis not present

## 2023-02-04 DIAGNOSIS — Z9181 History of falling: Secondary | ICD-10-CM | POA: Diagnosis not present

## 2023-02-04 DIAGNOSIS — I639 Cerebral infarction, unspecified: Secondary | ICD-10-CM | POA: Diagnosis not present

## 2023-02-04 DIAGNOSIS — I129 Hypertensive chronic kidney disease with stage 1 through stage 4 chronic kidney disease, or unspecified chronic kidney disease: Secondary | ICD-10-CM | POA: Diagnosis not present

## 2023-02-04 DIAGNOSIS — M329 Systemic lupus erythematosus, unspecified: Secondary | ICD-10-CM | POA: Diagnosis not present

## 2023-02-04 DIAGNOSIS — Z791 Long term (current) use of non-steroidal anti-inflammatories (NSAID): Secondary | ICD-10-CM | POA: Diagnosis not present

## 2023-02-04 DIAGNOSIS — Z604 Social exclusion and rejection: Secondary | ICD-10-CM | POA: Diagnosis not present

## 2023-02-04 DIAGNOSIS — I69398 Other sequelae of cerebral infarction: Secondary | ICD-10-CM | POA: Diagnosis not present

## 2023-02-04 DIAGNOSIS — K219 Gastro-esophageal reflux disease without esophagitis: Secondary | ICD-10-CM | POA: Diagnosis not present

## 2023-02-04 DIAGNOSIS — M069 Rheumatoid arthritis, unspecified: Secondary | ICD-10-CM | POA: Diagnosis not present

## 2023-02-04 DIAGNOSIS — G894 Chronic pain syndrome: Secondary | ICD-10-CM | POA: Diagnosis not present

## 2023-02-04 DIAGNOSIS — G35 Multiple sclerosis: Secondary | ICD-10-CM | POA: Diagnosis not present

## 2023-02-04 DIAGNOSIS — N183 Chronic kidney disease, stage 3 unspecified: Secondary | ICD-10-CM | POA: Diagnosis not present

## 2023-02-04 DIAGNOSIS — R531 Weakness: Secondary | ICD-10-CM | POA: Diagnosis not present

## 2023-02-04 DIAGNOSIS — M1990 Unspecified osteoarthritis, unspecified site: Secondary | ICD-10-CM | POA: Diagnosis not present

## 2023-02-04 DIAGNOSIS — E1122 Type 2 diabetes mellitus with diabetic chronic kidney disease: Secondary | ICD-10-CM | POA: Diagnosis not present

## 2023-02-04 DIAGNOSIS — K5090 Crohn's disease, unspecified, without complications: Secondary | ICD-10-CM | POA: Diagnosis not present

## 2023-02-04 DIAGNOSIS — E785 Hyperlipidemia, unspecified: Secondary | ICD-10-CM | POA: Diagnosis not present

## 2023-02-09 DIAGNOSIS — K219 Gastro-esophageal reflux disease without esophagitis: Secondary | ICD-10-CM | POA: Diagnosis not present

## 2023-02-09 DIAGNOSIS — I69398 Other sequelae of cerebral infarction: Secondary | ICD-10-CM | POA: Diagnosis not present

## 2023-02-09 DIAGNOSIS — G894 Chronic pain syndrome: Secondary | ICD-10-CM | POA: Diagnosis not present

## 2023-02-09 DIAGNOSIS — I129 Hypertensive chronic kidney disease with stage 1 through stage 4 chronic kidney disease, or unspecified chronic kidney disease: Secondary | ICD-10-CM | POA: Diagnosis not present

## 2023-02-09 DIAGNOSIS — R531 Weakness: Secondary | ICD-10-CM | POA: Diagnosis not present

## 2023-02-09 DIAGNOSIS — E785 Hyperlipidemia, unspecified: Secondary | ICD-10-CM | POA: Diagnosis not present

## 2023-02-09 DIAGNOSIS — Z604 Social exclusion and rejection: Secondary | ICD-10-CM | POA: Diagnosis not present

## 2023-02-09 DIAGNOSIS — Z9181 History of falling: Secondary | ICD-10-CM | POA: Diagnosis not present

## 2023-02-09 DIAGNOSIS — E1122 Type 2 diabetes mellitus with diabetic chronic kidney disease: Secondary | ICD-10-CM | POA: Diagnosis not present

## 2023-02-09 DIAGNOSIS — G35 Multiple sclerosis: Secondary | ICD-10-CM | POA: Diagnosis not present

## 2023-02-09 DIAGNOSIS — N183 Chronic kidney disease, stage 3 unspecified: Secondary | ICD-10-CM | POA: Diagnosis not present

## 2023-02-09 DIAGNOSIS — M1990 Unspecified osteoarthritis, unspecified site: Secondary | ICD-10-CM | POA: Diagnosis not present

## 2023-02-09 DIAGNOSIS — K5090 Crohn's disease, unspecified, without complications: Secondary | ICD-10-CM | POA: Diagnosis not present

## 2023-02-09 DIAGNOSIS — Z791 Long term (current) use of non-steroidal anti-inflammatories (NSAID): Secondary | ICD-10-CM | POA: Diagnosis not present

## 2023-02-09 DIAGNOSIS — K5900 Constipation, unspecified: Secondary | ICD-10-CM | POA: Diagnosis not present

## 2023-02-09 DIAGNOSIS — M329 Systemic lupus erythematosus, unspecified: Secondary | ICD-10-CM | POA: Diagnosis not present

## 2023-02-09 DIAGNOSIS — M069 Rheumatoid arthritis, unspecified: Secondary | ICD-10-CM | POA: Diagnosis not present

## 2023-02-11 DIAGNOSIS — E785 Hyperlipidemia, unspecified: Secondary | ICD-10-CM | POA: Diagnosis not present

## 2023-02-11 DIAGNOSIS — G894 Chronic pain syndrome: Secondary | ICD-10-CM | POA: Diagnosis not present

## 2023-02-11 DIAGNOSIS — I69398 Other sequelae of cerebral infarction: Secondary | ICD-10-CM | POA: Diagnosis not present

## 2023-02-11 DIAGNOSIS — Z9181 History of falling: Secondary | ICD-10-CM | POA: Diagnosis not present

## 2023-02-11 DIAGNOSIS — K5900 Constipation, unspecified: Secondary | ICD-10-CM | POA: Diagnosis not present

## 2023-02-11 DIAGNOSIS — G35 Multiple sclerosis: Secondary | ICD-10-CM | POA: Diagnosis not present

## 2023-02-11 DIAGNOSIS — G43709 Chronic migraine without aura, not intractable, without status migrainosus: Secondary | ICD-10-CM | POA: Diagnosis not present

## 2023-02-11 DIAGNOSIS — M069 Rheumatoid arthritis, unspecified: Secondary | ICD-10-CM | POA: Diagnosis not present

## 2023-02-11 DIAGNOSIS — M1990 Unspecified osteoarthritis, unspecified site: Secondary | ICD-10-CM | POA: Diagnosis not present

## 2023-02-11 DIAGNOSIS — Z791 Long term (current) use of non-steroidal anti-inflammatories (NSAID): Secondary | ICD-10-CM | POA: Diagnosis not present

## 2023-02-11 DIAGNOSIS — R531 Weakness: Secondary | ICD-10-CM | POA: Diagnosis not present

## 2023-02-11 DIAGNOSIS — K5090 Crohn's disease, unspecified, without complications: Secondary | ICD-10-CM | POA: Diagnosis not present

## 2023-02-11 DIAGNOSIS — M329 Systemic lupus erythematosus, unspecified: Secondary | ICD-10-CM | POA: Diagnosis not present

## 2023-02-11 DIAGNOSIS — I129 Hypertensive chronic kidney disease with stage 1 through stage 4 chronic kidney disease, or unspecified chronic kidney disease: Secondary | ICD-10-CM | POA: Diagnosis not present

## 2023-02-11 DIAGNOSIS — E1122 Type 2 diabetes mellitus with diabetic chronic kidney disease: Secondary | ICD-10-CM | POA: Diagnosis not present

## 2023-02-11 DIAGNOSIS — K219 Gastro-esophageal reflux disease without esophagitis: Secondary | ICD-10-CM | POA: Diagnosis not present

## 2023-02-11 DIAGNOSIS — Z604 Social exclusion and rejection: Secondary | ICD-10-CM | POA: Diagnosis not present

## 2023-02-11 DIAGNOSIS — N183 Chronic kidney disease, stage 3 unspecified: Secondary | ICD-10-CM | POA: Diagnosis not present

## 2023-02-16 DIAGNOSIS — N183 Chronic kidney disease, stage 3 unspecified: Secondary | ICD-10-CM | POA: Diagnosis not present

## 2023-02-16 DIAGNOSIS — E1122 Type 2 diabetes mellitus with diabetic chronic kidney disease: Secondary | ICD-10-CM | POA: Diagnosis not present

## 2023-02-16 DIAGNOSIS — G35 Multiple sclerosis: Secondary | ICD-10-CM | POA: Diagnosis not present

## 2023-02-16 DIAGNOSIS — I69398 Other sequelae of cerebral infarction: Secondary | ICD-10-CM | POA: Diagnosis not present

## 2023-02-16 DIAGNOSIS — I129 Hypertensive chronic kidney disease with stage 1 through stage 4 chronic kidney disease, or unspecified chronic kidney disease: Secondary | ICD-10-CM | POA: Diagnosis not present

## 2023-02-16 DIAGNOSIS — K5900 Constipation, unspecified: Secondary | ICD-10-CM | POA: Diagnosis not present

## 2023-02-16 DIAGNOSIS — Z9181 History of falling: Secondary | ICD-10-CM | POA: Diagnosis not present

## 2023-02-16 DIAGNOSIS — K5090 Crohn's disease, unspecified, without complications: Secondary | ICD-10-CM | POA: Diagnosis not present

## 2023-02-16 DIAGNOSIS — R531 Weakness: Secondary | ICD-10-CM | POA: Diagnosis not present

## 2023-02-16 DIAGNOSIS — E785 Hyperlipidemia, unspecified: Secondary | ICD-10-CM | POA: Diagnosis not present

## 2023-02-16 DIAGNOSIS — M1990 Unspecified osteoarthritis, unspecified site: Secondary | ICD-10-CM | POA: Diagnosis not present

## 2023-02-16 DIAGNOSIS — M069 Rheumatoid arthritis, unspecified: Secondary | ICD-10-CM | POA: Diagnosis not present

## 2023-02-16 DIAGNOSIS — Z604 Social exclusion and rejection: Secondary | ICD-10-CM | POA: Diagnosis not present

## 2023-02-16 DIAGNOSIS — K219 Gastro-esophageal reflux disease without esophagitis: Secondary | ICD-10-CM | POA: Diagnosis not present

## 2023-02-16 DIAGNOSIS — G894 Chronic pain syndrome: Secondary | ICD-10-CM | POA: Diagnosis not present

## 2023-02-16 DIAGNOSIS — M329 Systemic lupus erythematosus, unspecified: Secondary | ICD-10-CM | POA: Diagnosis not present

## 2023-02-16 DIAGNOSIS — Z791 Long term (current) use of non-steroidal anti-inflammatories (NSAID): Secondary | ICD-10-CM | POA: Diagnosis not present

## 2023-02-18 DIAGNOSIS — Z9181 History of falling: Secondary | ICD-10-CM | POA: Diagnosis not present

## 2023-02-18 DIAGNOSIS — M1990 Unspecified osteoarthritis, unspecified site: Secondary | ICD-10-CM | POA: Diagnosis not present

## 2023-02-18 DIAGNOSIS — N183 Chronic kidney disease, stage 3 unspecified: Secondary | ICD-10-CM | POA: Diagnosis not present

## 2023-02-18 DIAGNOSIS — Z604 Social exclusion and rejection: Secondary | ICD-10-CM | POA: Diagnosis not present

## 2023-02-18 DIAGNOSIS — K219 Gastro-esophageal reflux disease without esophagitis: Secondary | ICD-10-CM | POA: Diagnosis not present

## 2023-02-18 DIAGNOSIS — M329 Systemic lupus erythematosus, unspecified: Secondary | ICD-10-CM | POA: Diagnosis not present

## 2023-02-18 DIAGNOSIS — I69398 Other sequelae of cerebral infarction: Secondary | ICD-10-CM | POA: Diagnosis not present

## 2023-02-18 DIAGNOSIS — G35 Multiple sclerosis: Secondary | ICD-10-CM | POA: Diagnosis not present

## 2023-02-18 DIAGNOSIS — E1122 Type 2 diabetes mellitus with diabetic chronic kidney disease: Secondary | ICD-10-CM | POA: Diagnosis not present

## 2023-02-18 DIAGNOSIS — R531 Weakness: Secondary | ICD-10-CM | POA: Diagnosis not present

## 2023-02-18 DIAGNOSIS — I129 Hypertensive chronic kidney disease with stage 1 through stage 4 chronic kidney disease, or unspecified chronic kidney disease: Secondary | ICD-10-CM | POA: Diagnosis not present

## 2023-02-18 DIAGNOSIS — Z791 Long term (current) use of non-steroidal anti-inflammatories (NSAID): Secondary | ICD-10-CM | POA: Diagnosis not present

## 2023-02-18 DIAGNOSIS — K5090 Crohn's disease, unspecified, without complications: Secondary | ICD-10-CM | POA: Diagnosis not present

## 2023-02-18 DIAGNOSIS — K5900 Constipation, unspecified: Secondary | ICD-10-CM | POA: Diagnosis not present

## 2023-02-18 DIAGNOSIS — E785 Hyperlipidemia, unspecified: Secondary | ICD-10-CM | POA: Diagnosis not present

## 2023-02-18 DIAGNOSIS — M069 Rheumatoid arthritis, unspecified: Secondary | ICD-10-CM | POA: Diagnosis not present

## 2023-02-18 DIAGNOSIS — G894 Chronic pain syndrome: Secondary | ICD-10-CM | POA: Diagnosis not present

## 2023-02-19 DIAGNOSIS — Z1389 Encounter for screening for other disorder: Secondary | ICD-10-CM | POA: Diagnosis not present

## 2023-02-19 DIAGNOSIS — G894 Chronic pain syndrome: Secondary | ICD-10-CM | POA: Diagnosis not present

## 2023-02-19 DIAGNOSIS — M329 Systemic lupus erythematosus, unspecified: Secondary | ICD-10-CM | POA: Diagnosis not present

## 2023-02-19 DIAGNOSIS — R4702 Dysphasia: Secondary | ICD-10-CM | POA: Diagnosis not present

## 2023-02-19 DIAGNOSIS — R519 Headache, unspecified: Secondary | ICD-10-CM | POA: Diagnosis not present

## 2023-02-19 DIAGNOSIS — M47816 Spondylosis without myelopathy or radiculopathy, lumbar region: Secondary | ICD-10-CM | POA: Diagnosis not present

## 2023-02-19 DIAGNOSIS — G35 Multiple sclerosis: Secondary | ICD-10-CM | POA: Diagnosis not present

## 2023-02-19 DIAGNOSIS — M5416 Radiculopathy, lumbar region: Secondary | ICD-10-CM | POA: Diagnosis not present

## 2023-02-22 DIAGNOSIS — K219 Gastro-esophageal reflux disease without esophagitis: Secondary | ICD-10-CM | POA: Diagnosis not present

## 2023-02-22 DIAGNOSIS — G35 Multiple sclerosis: Secondary | ICD-10-CM | POA: Diagnosis not present

## 2023-02-22 DIAGNOSIS — G894 Chronic pain syndrome: Secondary | ICD-10-CM | POA: Diagnosis not present

## 2023-02-22 DIAGNOSIS — I69398 Other sequelae of cerebral infarction: Secondary | ICD-10-CM | POA: Diagnosis not present

## 2023-02-22 DIAGNOSIS — M069 Rheumatoid arthritis, unspecified: Secondary | ICD-10-CM | POA: Diagnosis not present

## 2023-02-22 DIAGNOSIS — K5900 Constipation, unspecified: Secondary | ICD-10-CM | POA: Diagnosis not present

## 2023-02-22 DIAGNOSIS — Z791 Long term (current) use of non-steroidal anti-inflammatories (NSAID): Secondary | ICD-10-CM | POA: Diagnosis not present

## 2023-02-22 DIAGNOSIS — E1122 Type 2 diabetes mellitus with diabetic chronic kidney disease: Secondary | ICD-10-CM | POA: Diagnosis not present

## 2023-02-22 DIAGNOSIS — N183 Chronic kidney disease, stage 3 unspecified: Secondary | ICD-10-CM | POA: Diagnosis not present

## 2023-02-22 DIAGNOSIS — Z9181 History of falling: Secondary | ICD-10-CM | POA: Diagnosis not present

## 2023-02-22 DIAGNOSIS — Z604 Social exclusion and rejection: Secondary | ICD-10-CM | POA: Diagnosis not present

## 2023-02-22 DIAGNOSIS — M329 Systemic lupus erythematosus, unspecified: Secondary | ICD-10-CM | POA: Diagnosis not present

## 2023-02-22 DIAGNOSIS — R531 Weakness: Secondary | ICD-10-CM | POA: Diagnosis not present

## 2023-02-22 DIAGNOSIS — M1990 Unspecified osteoarthritis, unspecified site: Secondary | ICD-10-CM | POA: Diagnosis not present

## 2023-02-22 DIAGNOSIS — K5090 Crohn's disease, unspecified, without complications: Secondary | ICD-10-CM | POA: Diagnosis not present

## 2023-02-22 DIAGNOSIS — E785 Hyperlipidemia, unspecified: Secondary | ICD-10-CM | POA: Diagnosis not present

## 2023-02-22 DIAGNOSIS — I129 Hypertensive chronic kidney disease with stage 1 through stage 4 chronic kidney disease, or unspecified chronic kidney disease: Secondary | ICD-10-CM | POA: Diagnosis not present

## 2023-02-25 DIAGNOSIS — I129 Hypertensive chronic kidney disease with stage 1 through stage 4 chronic kidney disease, or unspecified chronic kidney disease: Secondary | ICD-10-CM | POA: Diagnosis not present

## 2023-02-25 DIAGNOSIS — E785 Hyperlipidemia, unspecified: Secondary | ICD-10-CM | POA: Diagnosis not present

## 2023-02-25 DIAGNOSIS — E1122 Type 2 diabetes mellitus with diabetic chronic kidney disease: Secondary | ICD-10-CM | POA: Diagnosis not present

## 2023-02-25 DIAGNOSIS — R531 Weakness: Secondary | ICD-10-CM | POA: Diagnosis not present

## 2023-02-25 DIAGNOSIS — N183 Chronic kidney disease, stage 3 unspecified: Secondary | ICD-10-CM | POA: Diagnosis not present

## 2023-02-25 DIAGNOSIS — Z791 Long term (current) use of non-steroidal anti-inflammatories (NSAID): Secondary | ICD-10-CM | POA: Diagnosis not present

## 2023-02-25 DIAGNOSIS — I69398 Other sequelae of cerebral infarction: Secondary | ICD-10-CM | POA: Diagnosis not present

## 2023-02-25 DIAGNOSIS — Z9181 History of falling: Secondary | ICD-10-CM | POA: Diagnosis not present

## 2023-02-25 DIAGNOSIS — K5090 Crohn's disease, unspecified, without complications: Secondary | ICD-10-CM | POA: Diagnosis not present

## 2023-02-25 DIAGNOSIS — M1990 Unspecified osteoarthritis, unspecified site: Secondary | ICD-10-CM | POA: Diagnosis not present

## 2023-02-25 DIAGNOSIS — G35 Multiple sclerosis: Secondary | ICD-10-CM | POA: Diagnosis not present

## 2023-02-25 DIAGNOSIS — K5900 Constipation, unspecified: Secondary | ICD-10-CM | POA: Diagnosis not present

## 2023-02-25 DIAGNOSIS — K219 Gastro-esophageal reflux disease without esophagitis: Secondary | ICD-10-CM | POA: Diagnosis not present

## 2023-02-25 DIAGNOSIS — M069 Rheumatoid arthritis, unspecified: Secondary | ICD-10-CM | POA: Diagnosis not present

## 2023-02-25 DIAGNOSIS — G894 Chronic pain syndrome: Secondary | ICD-10-CM | POA: Diagnosis not present

## 2023-02-25 DIAGNOSIS — M329 Systemic lupus erythematosus, unspecified: Secondary | ICD-10-CM | POA: Diagnosis not present

## 2023-02-25 DIAGNOSIS — Z604 Social exclusion and rejection: Secondary | ICD-10-CM | POA: Diagnosis not present

## 2023-03-04 DIAGNOSIS — M069 Rheumatoid arthritis, unspecified: Secondary | ICD-10-CM | POA: Diagnosis not present

## 2023-03-04 DIAGNOSIS — K5090 Crohn's disease, unspecified, without complications: Secondary | ICD-10-CM | POA: Diagnosis not present

## 2023-03-04 DIAGNOSIS — R531 Weakness: Secondary | ICD-10-CM | POA: Diagnosis not present

## 2023-03-04 DIAGNOSIS — Z9181 History of falling: Secondary | ICD-10-CM | POA: Diagnosis not present

## 2023-03-04 DIAGNOSIS — K219 Gastro-esophageal reflux disease without esophagitis: Secondary | ICD-10-CM | POA: Diagnosis not present

## 2023-03-04 DIAGNOSIS — M329 Systemic lupus erythematosus, unspecified: Secondary | ICD-10-CM | POA: Diagnosis not present

## 2023-03-04 DIAGNOSIS — G894 Chronic pain syndrome: Secondary | ICD-10-CM | POA: Diagnosis not present

## 2023-03-04 DIAGNOSIS — E785 Hyperlipidemia, unspecified: Secondary | ICD-10-CM | POA: Diagnosis not present

## 2023-03-04 DIAGNOSIS — I129 Hypertensive chronic kidney disease with stage 1 through stage 4 chronic kidney disease, or unspecified chronic kidney disease: Secondary | ICD-10-CM | POA: Diagnosis not present

## 2023-03-04 DIAGNOSIS — G35 Multiple sclerosis: Secondary | ICD-10-CM | POA: Diagnosis not present

## 2023-03-04 DIAGNOSIS — M1990 Unspecified osteoarthritis, unspecified site: Secondary | ICD-10-CM | POA: Diagnosis not present

## 2023-03-04 DIAGNOSIS — Z604 Social exclusion and rejection: Secondary | ICD-10-CM | POA: Diagnosis not present

## 2023-03-04 DIAGNOSIS — N183 Chronic kidney disease, stage 3 unspecified: Secondary | ICD-10-CM | POA: Diagnosis not present

## 2023-03-04 DIAGNOSIS — E1122 Type 2 diabetes mellitus with diabetic chronic kidney disease: Secondary | ICD-10-CM | POA: Diagnosis not present

## 2023-03-04 DIAGNOSIS — I69398 Other sequelae of cerebral infarction: Secondary | ICD-10-CM | POA: Diagnosis not present

## 2023-03-04 DIAGNOSIS — K5900 Constipation, unspecified: Secondary | ICD-10-CM | POA: Diagnosis not present

## 2023-03-04 DIAGNOSIS — Z791 Long term (current) use of non-steroidal anti-inflammatories (NSAID): Secondary | ICD-10-CM | POA: Diagnosis not present

## 2023-03-05 DIAGNOSIS — G894 Chronic pain syndrome: Secondary | ICD-10-CM | POA: Diagnosis not present

## 2023-03-05 DIAGNOSIS — K219 Gastro-esophageal reflux disease without esophagitis: Secondary | ICD-10-CM | POA: Diagnosis not present

## 2023-03-05 DIAGNOSIS — N183 Chronic kidney disease, stage 3 unspecified: Secondary | ICD-10-CM | POA: Diagnosis not present

## 2023-03-05 DIAGNOSIS — Z9181 History of falling: Secondary | ICD-10-CM | POA: Diagnosis not present

## 2023-03-05 DIAGNOSIS — Z604 Social exclusion and rejection: Secondary | ICD-10-CM | POA: Diagnosis not present

## 2023-03-05 DIAGNOSIS — M069 Rheumatoid arthritis, unspecified: Secondary | ICD-10-CM | POA: Diagnosis not present

## 2023-03-05 DIAGNOSIS — E785 Hyperlipidemia, unspecified: Secondary | ICD-10-CM | POA: Diagnosis not present

## 2023-03-05 DIAGNOSIS — K5900 Constipation, unspecified: Secondary | ICD-10-CM | POA: Diagnosis not present

## 2023-03-05 DIAGNOSIS — R531 Weakness: Secondary | ICD-10-CM | POA: Diagnosis not present

## 2023-03-05 DIAGNOSIS — M329 Systemic lupus erythematosus, unspecified: Secondary | ICD-10-CM | POA: Diagnosis not present

## 2023-03-05 DIAGNOSIS — E1122 Type 2 diabetes mellitus with diabetic chronic kidney disease: Secondary | ICD-10-CM | POA: Diagnosis not present

## 2023-03-05 DIAGNOSIS — I129 Hypertensive chronic kidney disease with stage 1 through stage 4 chronic kidney disease, or unspecified chronic kidney disease: Secondary | ICD-10-CM | POA: Diagnosis not present

## 2023-03-05 DIAGNOSIS — K5090 Crohn's disease, unspecified, without complications: Secondary | ICD-10-CM | POA: Diagnosis not present

## 2023-03-05 DIAGNOSIS — G35 Multiple sclerosis: Secondary | ICD-10-CM | POA: Diagnosis not present

## 2023-03-05 DIAGNOSIS — M1990 Unspecified osteoarthritis, unspecified site: Secondary | ICD-10-CM | POA: Diagnosis not present

## 2023-03-05 DIAGNOSIS — I69398 Other sequelae of cerebral infarction: Secondary | ICD-10-CM | POA: Diagnosis not present

## 2023-03-05 DIAGNOSIS — Z791 Long term (current) use of non-steroidal anti-inflammatories (NSAID): Secondary | ICD-10-CM | POA: Diagnosis not present

## 2023-03-07 DIAGNOSIS — I639 Cerebral infarction, unspecified: Secondary | ICD-10-CM | POA: Diagnosis not present

## 2023-03-10 DIAGNOSIS — I129 Hypertensive chronic kidney disease with stage 1 through stage 4 chronic kidney disease, or unspecified chronic kidney disease: Secondary | ICD-10-CM | POA: Diagnosis not present

## 2023-03-10 DIAGNOSIS — G894 Chronic pain syndrome: Secondary | ICD-10-CM | POA: Diagnosis not present

## 2023-03-10 DIAGNOSIS — M1990 Unspecified osteoarthritis, unspecified site: Secondary | ICD-10-CM | POA: Diagnosis not present

## 2023-03-10 DIAGNOSIS — I69398 Other sequelae of cerebral infarction: Secondary | ICD-10-CM | POA: Diagnosis not present

## 2023-03-10 DIAGNOSIS — Z9181 History of falling: Secondary | ICD-10-CM | POA: Diagnosis not present

## 2023-03-10 DIAGNOSIS — N183 Chronic kidney disease, stage 3 unspecified: Secondary | ICD-10-CM | POA: Diagnosis not present

## 2023-03-10 DIAGNOSIS — G35 Multiple sclerosis: Secondary | ICD-10-CM | POA: Diagnosis not present

## 2023-03-10 DIAGNOSIS — K5900 Constipation, unspecified: Secondary | ICD-10-CM | POA: Diagnosis not present

## 2023-03-10 DIAGNOSIS — M069 Rheumatoid arthritis, unspecified: Secondary | ICD-10-CM | POA: Diagnosis not present

## 2023-03-10 DIAGNOSIS — M329 Systemic lupus erythematosus, unspecified: Secondary | ICD-10-CM | POA: Diagnosis not present

## 2023-03-10 DIAGNOSIS — Z791 Long term (current) use of non-steroidal anti-inflammatories (NSAID): Secondary | ICD-10-CM | POA: Diagnosis not present

## 2023-03-10 DIAGNOSIS — E1122 Type 2 diabetes mellitus with diabetic chronic kidney disease: Secondary | ICD-10-CM | POA: Diagnosis not present

## 2023-03-10 DIAGNOSIS — K5090 Crohn's disease, unspecified, without complications: Secondary | ICD-10-CM | POA: Diagnosis not present

## 2023-03-10 DIAGNOSIS — Z604 Social exclusion and rejection: Secondary | ICD-10-CM | POA: Diagnosis not present

## 2023-03-10 DIAGNOSIS — E785 Hyperlipidemia, unspecified: Secondary | ICD-10-CM | POA: Diagnosis not present

## 2023-03-10 DIAGNOSIS — R531 Weakness: Secondary | ICD-10-CM | POA: Diagnosis not present

## 2023-03-10 DIAGNOSIS — K219 Gastro-esophageal reflux disease without esophagitis: Secondary | ICD-10-CM | POA: Diagnosis not present

## 2023-03-12 DIAGNOSIS — M329 Systemic lupus erythematosus, unspecified: Secondary | ICD-10-CM | POA: Diagnosis not present

## 2023-03-12 DIAGNOSIS — E785 Hyperlipidemia, unspecified: Secondary | ICD-10-CM | POA: Diagnosis not present

## 2023-03-12 DIAGNOSIS — I69398 Other sequelae of cerebral infarction: Secondary | ICD-10-CM | POA: Diagnosis not present

## 2023-03-12 DIAGNOSIS — Z604 Social exclusion and rejection: Secondary | ICD-10-CM | POA: Diagnosis not present

## 2023-03-12 DIAGNOSIS — R531 Weakness: Secondary | ICD-10-CM | POA: Diagnosis not present

## 2023-03-12 DIAGNOSIS — M069 Rheumatoid arthritis, unspecified: Secondary | ICD-10-CM | POA: Diagnosis not present

## 2023-03-12 DIAGNOSIS — Z791 Long term (current) use of non-steroidal anti-inflammatories (NSAID): Secondary | ICD-10-CM | POA: Diagnosis not present

## 2023-03-12 DIAGNOSIS — K219 Gastro-esophageal reflux disease without esophagitis: Secondary | ICD-10-CM | POA: Diagnosis not present

## 2023-03-12 DIAGNOSIS — M1990 Unspecified osteoarthritis, unspecified site: Secondary | ICD-10-CM | POA: Diagnosis not present

## 2023-03-12 DIAGNOSIS — K5090 Crohn's disease, unspecified, without complications: Secondary | ICD-10-CM | POA: Diagnosis not present

## 2023-03-12 DIAGNOSIS — G894 Chronic pain syndrome: Secondary | ICD-10-CM | POA: Diagnosis not present

## 2023-03-12 DIAGNOSIS — G35 Multiple sclerosis: Secondary | ICD-10-CM | POA: Diagnosis not present

## 2023-03-12 DIAGNOSIS — N183 Chronic kidney disease, stage 3 unspecified: Secondary | ICD-10-CM | POA: Diagnosis not present

## 2023-03-12 DIAGNOSIS — K5900 Constipation, unspecified: Secondary | ICD-10-CM | POA: Diagnosis not present

## 2023-03-12 DIAGNOSIS — E1122 Type 2 diabetes mellitus with diabetic chronic kidney disease: Secondary | ICD-10-CM | POA: Diagnosis not present

## 2023-03-12 DIAGNOSIS — I129 Hypertensive chronic kidney disease with stage 1 through stage 4 chronic kidney disease, or unspecified chronic kidney disease: Secondary | ICD-10-CM | POA: Diagnosis not present

## 2023-03-12 DIAGNOSIS — Z9181 History of falling: Secondary | ICD-10-CM | POA: Diagnosis not present

## 2023-03-24 DIAGNOSIS — M1990 Unspecified osteoarthritis, unspecified site: Secondary | ICD-10-CM | POA: Diagnosis not present

## 2023-03-24 DIAGNOSIS — K5090 Crohn's disease, unspecified, without complications: Secondary | ICD-10-CM | POA: Diagnosis not present

## 2023-03-24 DIAGNOSIS — G35 Multiple sclerosis: Secondary | ICD-10-CM | POA: Diagnosis not present

## 2023-03-24 DIAGNOSIS — M069 Rheumatoid arthritis, unspecified: Secondary | ICD-10-CM | POA: Diagnosis not present

## 2023-03-24 DIAGNOSIS — I69398 Other sequelae of cerebral infarction: Secondary | ICD-10-CM | POA: Diagnosis not present

## 2023-03-24 DIAGNOSIS — I129 Hypertensive chronic kidney disease with stage 1 through stage 4 chronic kidney disease, or unspecified chronic kidney disease: Secondary | ICD-10-CM | POA: Diagnosis not present

## 2023-03-24 DIAGNOSIS — E785 Hyperlipidemia, unspecified: Secondary | ICD-10-CM | POA: Diagnosis not present

## 2023-03-24 DIAGNOSIS — G894 Chronic pain syndrome: Secondary | ICD-10-CM | POA: Diagnosis not present

## 2023-03-24 DIAGNOSIS — Z604 Social exclusion and rejection: Secondary | ICD-10-CM | POA: Diagnosis not present

## 2023-03-24 DIAGNOSIS — Z9181 History of falling: Secondary | ICD-10-CM | POA: Diagnosis not present

## 2023-03-24 DIAGNOSIS — K219 Gastro-esophageal reflux disease without esophagitis: Secondary | ICD-10-CM | POA: Diagnosis not present

## 2023-03-24 DIAGNOSIS — E1122 Type 2 diabetes mellitus with diabetic chronic kidney disease: Secondary | ICD-10-CM | POA: Diagnosis not present

## 2023-03-24 DIAGNOSIS — K5900 Constipation, unspecified: Secondary | ICD-10-CM | POA: Diagnosis not present

## 2023-03-24 DIAGNOSIS — M329 Systemic lupus erythematosus, unspecified: Secondary | ICD-10-CM | POA: Diagnosis not present

## 2023-03-24 DIAGNOSIS — N183 Chronic kidney disease, stage 3 unspecified: Secondary | ICD-10-CM | POA: Diagnosis not present

## 2023-03-24 DIAGNOSIS — R531 Weakness: Secondary | ICD-10-CM | POA: Diagnosis not present

## 2023-03-24 DIAGNOSIS — Z791 Long term (current) use of non-steroidal anti-inflammatories (NSAID): Secondary | ICD-10-CM | POA: Diagnosis not present

## 2023-03-26 DIAGNOSIS — I69398 Other sequelae of cerebral infarction: Secondary | ICD-10-CM | POA: Diagnosis not present

## 2023-03-26 DIAGNOSIS — M1990 Unspecified osteoarthritis, unspecified site: Secondary | ICD-10-CM | POA: Diagnosis not present

## 2023-03-26 DIAGNOSIS — K5090 Crohn's disease, unspecified, without complications: Secondary | ICD-10-CM | POA: Diagnosis not present

## 2023-03-26 DIAGNOSIS — Z791 Long term (current) use of non-steroidal anti-inflammatories (NSAID): Secondary | ICD-10-CM | POA: Diagnosis not present

## 2023-03-26 DIAGNOSIS — K219 Gastro-esophageal reflux disease without esophagitis: Secondary | ICD-10-CM | POA: Diagnosis not present

## 2023-03-26 DIAGNOSIS — M329 Systemic lupus erythematosus, unspecified: Secondary | ICD-10-CM | POA: Diagnosis not present

## 2023-03-26 DIAGNOSIS — K5900 Constipation, unspecified: Secondary | ICD-10-CM | POA: Diagnosis not present

## 2023-03-26 DIAGNOSIS — G894 Chronic pain syndrome: Secondary | ICD-10-CM | POA: Diagnosis not present

## 2023-03-26 DIAGNOSIS — M069 Rheumatoid arthritis, unspecified: Secondary | ICD-10-CM | POA: Diagnosis not present

## 2023-03-26 DIAGNOSIS — Z604 Social exclusion and rejection: Secondary | ICD-10-CM | POA: Diagnosis not present

## 2023-03-26 DIAGNOSIS — E1122 Type 2 diabetes mellitus with diabetic chronic kidney disease: Secondary | ICD-10-CM | POA: Diagnosis not present

## 2023-03-26 DIAGNOSIS — E785 Hyperlipidemia, unspecified: Secondary | ICD-10-CM | POA: Diagnosis not present

## 2023-03-26 DIAGNOSIS — R531 Weakness: Secondary | ICD-10-CM | POA: Diagnosis not present

## 2023-03-26 DIAGNOSIS — I129 Hypertensive chronic kidney disease with stage 1 through stage 4 chronic kidney disease, or unspecified chronic kidney disease: Secondary | ICD-10-CM | POA: Diagnosis not present

## 2023-03-26 DIAGNOSIS — N183 Chronic kidney disease, stage 3 unspecified: Secondary | ICD-10-CM | POA: Diagnosis not present

## 2023-03-26 DIAGNOSIS — Z9181 History of falling: Secondary | ICD-10-CM | POA: Diagnosis not present

## 2023-03-26 DIAGNOSIS — G35 Multiple sclerosis: Secondary | ICD-10-CM | POA: Diagnosis not present

## 2023-03-29 DIAGNOSIS — M069 Rheumatoid arthritis, unspecified: Secondary | ICD-10-CM | POA: Diagnosis not present

## 2023-03-29 DIAGNOSIS — G894 Chronic pain syndrome: Secondary | ICD-10-CM | POA: Diagnosis not present

## 2023-03-29 DIAGNOSIS — E1122 Type 2 diabetes mellitus with diabetic chronic kidney disease: Secondary | ICD-10-CM | POA: Diagnosis not present

## 2023-03-29 DIAGNOSIS — Z791 Long term (current) use of non-steroidal anti-inflammatories (NSAID): Secondary | ICD-10-CM | POA: Diagnosis not present

## 2023-03-29 DIAGNOSIS — I69398 Other sequelae of cerebral infarction: Secondary | ICD-10-CM | POA: Diagnosis not present

## 2023-03-29 DIAGNOSIS — E785 Hyperlipidemia, unspecified: Secondary | ICD-10-CM | POA: Diagnosis not present

## 2023-03-29 DIAGNOSIS — K5090 Crohn's disease, unspecified, without complications: Secondary | ICD-10-CM | POA: Diagnosis not present

## 2023-03-29 DIAGNOSIS — I129 Hypertensive chronic kidney disease with stage 1 through stage 4 chronic kidney disease, or unspecified chronic kidney disease: Secondary | ICD-10-CM | POA: Diagnosis not present

## 2023-03-29 DIAGNOSIS — G35 Multiple sclerosis: Secondary | ICD-10-CM | POA: Diagnosis not present

## 2023-03-29 DIAGNOSIS — K5900 Constipation, unspecified: Secondary | ICD-10-CM | POA: Diagnosis not present

## 2023-03-29 DIAGNOSIS — K219 Gastro-esophageal reflux disease without esophagitis: Secondary | ICD-10-CM | POA: Diagnosis not present

## 2023-03-29 DIAGNOSIS — M1990 Unspecified osteoarthritis, unspecified site: Secondary | ICD-10-CM | POA: Diagnosis not present

## 2023-03-29 DIAGNOSIS — M329 Systemic lupus erythematosus, unspecified: Secondary | ICD-10-CM | POA: Diagnosis not present

## 2023-03-29 DIAGNOSIS — Z9181 History of falling: Secondary | ICD-10-CM | POA: Diagnosis not present

## 2023-03-29 DIAGNOSIS — R531 Weakness: Secondary | ICD-10-CM | POA: Diagnosis not present

## 2023-03-29 DIAGNOSIS — N183 Chronic kidney disease, stage 3 unspecified: Secondary | ICD-10-CM | POA: Diagnosis not present

## 2023-03-29 DIAGNOSIS — Z604 Social exclusion and rejection: Secondary | ICD-10-CM | POA: Diagnosis not present

## 2023-03-30 DIAGNOSIS — M47816 Spondylosis without myelopathy or radiculopathy, lumbar region: Secondary | ICD-10-CM | POA: Diagnosis not present

## 2023-03-30 DIAGNOSIS — G35 Multiple sclerosis: Secondary | ICD-10-CM | POA: Diagnosis not present

## 2023-03-30 DIAGNOSIS — R4702 Dysphasia: Secondary | ICD-10-CM | POA: Diagnosis not present

## 2023-03-30 DIAGNOSIS — M329 Systemic lupus erythematosus, unspecified: Secondary | ICD-10-CM | POA: Diagnosis not present

## 2023-03-30 DIAGNOSIS — M5416 Radiculopathy, lumbar region: Secondary | ICD-10-CM | POA: Diagnosis not present

## 2023-03-30 DIAGNOSIS — Z1389 Encounter for screening for other disorder: Secondary | ICD-10-CM | POA: Diagnosis not present

## 2023-03-30 DIAGNOSIS — Z79891 Long term (current) use of opiate analgesic: Secondary | ICD-10-CM | POA: Diagnosis not present

## 2023-03-30 DIAGNOSIS — G894 Chronic pain syndrome: Secondary | ICD-10-CM | POA: Diagnosis not present

## 2023-03-30 DIAGNOSIS — R519 Headache, unspecified: Secondary | ICD-10-CM | POA: Diagnosis not present

## 2023-04-01 DIAGNOSIS — I69398 Other sequelae of cerebral infarction: Secondary | ICD-10-CM | POA: Diagnosis not present

## 2023-04-01 DIAGNOSIS — N183 Chronic kidney disease, stage 3 unspecified: Secondary | ICD-10-CM | POA: Diagnosis not present

## 2023-04-01 DIAGNOSIS — M069 Rheumatoid arthritis, unspecified: Secondary | ICD-10-CM | POA: Diagnosis not present

## 2023-04-01 DIAGNOSIS — E1122 Type 2 diabetes mellitus with diabetic chronic kidney disease: Secondary | ICD-10-CM | POA: Diagnosis not present

## 2023-04-01 DIAGNOSIS — M1990 Unspecified osteoarthritis, unspecified site: Secondary | ICD-10-CM | POA: Diagnosis not present

## 2023-04-01 DIAGNOSIS — E785 Hyperlipidemia, unspecified: Secondary | ICD-10-CM | POA: Diagnosis not present

## 2023-04-01 DIAGNOSIS — M329 Systemic lupus erythematosus, unspecified: Secondary | ICD-10-CM | POA: Diagnosis not present

## 2023-04-01 DIAGNOSIS — G35 Multiple sclerosis: Secondary | ICD-10-CM | POA: Diagnosis not present

## 2023-04-01 DIAGNOSIS — I129 Hypertensive chronic kidney disease with stage 1 through stage 4 chronic kidney disease, or unspecified chronic kidney disease: Secondary | ICD-10-CM | POA: Diagnosis not present

## 2023-04-01 DIAGNOSIS — K5090 Crohn's disease, unspecified, without complications: Secondary | ICD-10-CM | POA: Diagnosis not present

## 2023-04-01 DIAGNOSIS — R531 Weakness: Secondary | ICD-10-CM | POA: Diagnosis not present

## 2023-04-01 DIAGNOSIS — Z9181 History of falling: Secondary | ICD-10-CM | POA: Diagnosis not present

## 2023-04-01 DIAGNOSIS — Z791 Long term (current) use of non-steroidal anti-inflammatories (NSAID): Secondary | ICD-10-CM | POA: Diagnosis not present

## 2023-04-01 DIAGNOSIS — G894 Chronic pain syndrome: Secondary | ICD-10-CM | POA: Diagnosis not present

## 2023-04-01 DIAGNOSIS — Z604 Social exclusion and rejection: Secondary | ICD-10-CM | POA: Diagnosis not present

## 2023-04-01 DIAGNOSIS — K5900 Constipation, unspecified: Secondary | ICD-10-CM | POA: Diagnosis not present

## 2023-04-01 DIAGNOSIS — K219 Gastro-esophageal reflux disease without esophagitis: Secondary | ICD-10-CM | POA: Diagnosis not present

## 2023-04-07 DIAGNOSIS — E785 Hyperlipidemia, unspecified: Secondary | ICD-10-CM | POA: Diagnosis not present

## 2023-04-07 DIAGNOSIS — M329 Systemic lupus erythematosus, unspecified: Secondary | ICD-10-CM | POA: Diagnosis not present

## 2023-04-07 DIAGNOSIS — I639 Cerebral infarction, unspecified: Secondary | ICD-10-CM | POA: Diagnosis not present

## 2023-04-07 DIAGNOSIS — K219 Gastro-esophageal reflux disease without esophagitis: Secondary | ICD-10-CM | POA: Diagnosis not present

## 2023-04-07 DIAGNOSIS — R531 Weakness: Secondary | ICD-10-CM | POA: Diagnosis not present

## 2023-04-07 DIAGNOSIS — I69398 Other sequelae of cerebral infarction: Secondary | ICD-10-CM | POA: Diagnosis not present

## 2023-04-07 DIAGNOSIS — N183 Chronic kidney disease, stage 3 unspecified: Secondary | ICD-10-CM | POA: Diagnosis not present

## 2023-04-07 DIAGNOSIS — E1122 Type 2 diabetes mellitus with diabetic chronic kidney disease: Secondary | ICD-10-CM | POA: Diagnosis not present

## 2023-04-07 DIAGNOSIS — Z604 Social exclusion and rejection: Secondary | ICD-10-CM | POA: Diagnosis not present

## 2023-04-07 DIAGNOSIS — K5090 Crohn's disease, unspecified, without complications: Secondary | ICD-10-CM | POA: Diagnosis not present

## 2023-04-07 DIAGNOSIS — Z9181 History of falling: Secondary | ICD-10-CM | POA: Diagnosis not present

## 2023-04-07 DIAGNOSIS — Z791 Long term (current) use of non-steroidal anti-inflammatories (NSAID): Secondary | ICD-10-CM | POA: Diagnosis not present

## 2023-04-07 DIAGNOSIS — K5900 Constipation, unspecified: Secondary | ICD-10-CM | POA: Diagnosis not present

## 2023-04-07 DIAGNOSIS — M1990 Unspecified osteoarthritis, unspecified site: Secondary | ICD-10-CM | POA: Diagnosis not present

## 2023-04-07 DIAGNOSIS — M069 Rheumatoid arthritis, unspecified: Secondary | ICD-10-CM | POA: Diagnosis not present

## 2023-04-07 DIAGNOSIS — G894 Chronic pain syndrome: Secondary | ICD-10-CM | POA: Diagnosis not present

## 2023-04-07 DIAGNOSIS — G35 Multiple sclerosis: Secondary | ICD-10-CM | POA: Diagnosis not present

## 2023-04-07 DIAGNOSIS — I129 Hypertensive chronic kidney disease with stage 1 through stage 4 chronic kidney disease, or unspecified chronic kidney disease: Secondary | ICD-10-CM | POA: Diagnosis not present

## 2023-04-13 DIAGNOSIS — K219 Gastro-esophageal reflux disease without esophagitis: Secondary | ICD-10-CM | POA: Diagnosis not present

## 2023-04-13 DIAGNOSIS — I129 Hypertensive chronic kidney disease with stage 1 through stage 4 chronic kidney disease, or unspecified chronic kidney disease: Secondary | ICD-10-CM | POA: Diagnosis not present

## 2023-04-13 DIAGNOSIS — K5090 Crohn's disease, unspecified, without complications: Secondary | ICD-10-CM | POA: Diagnosis not present

## 2023-04-13 DIAGNOSIS — K5900 Constipation, unspecified: Secondary | ICD-10-CM | POA: Diagnosis not present

## 2023-04-13 DIAGNOSIS — Z791 Long term (current) use of non-steroidal anti-inflammatories (NSAID): Secondary | ICD-10-CM | POA: Diagnosis not present

## 2023-04-13 DIAGNOSIS — R531 Weakness: Secondary | ICD-10-CM | POA: Diagnosis not present

## 2023-04-13 DIAGNOSIS — E1122 Type 2 diabetes mellitus with diabetic chronic kidney disease: Secondary | ICD-10-CM | POA: Diagnosis not present

## 2023-04-13 DIAGNOSIS — M1990 Unspecified osteoarthritis, unspecified site: Secondary | ICD-10-CM | POA: Diagnosis not present

## 2023-04-13 DIAGNOSIS — G894 Chronic pain syndrome: Secondary | ICD-10-CM | POA: Diagnosis not present

## 2023-04-13 DIAGNOSIS — G35 Multiple sclerosis: Secondary | ICD-10-CM | POA: Diagnosis not present

## 2023-04-13 DIAGNOSIS — N183 Chronic kidney disease, stage 3 unspecified: Secondary | ICD-10-CM | POA: Diagnosis not present

## 2023-04-13 DIAGNOSIS — M069 Rheumatoid arthritis, unspecified: Secondary | ICD-10-CM | POA: Diagnosis not present

## 2023-04-13 DIAGNOSIS — Z9181 History of falling: Secondary | ICD-10-CM | POA: Diagnosis not present

## 2023-04-13 DIAGNOSIS — Z604 Social exclusion and rejection: Secondary | ICD-10-CM | POA: Diagnosis not present

## 2023-04-13 DIAGNOSIS — E785 Hyperlipidemia, unspecified: Secondary | ICD-10-CM | POA: Diagnosis not present

## 2023-04-13 DIAGNOSIS — M329 Systemic lupus erythematosus, unspecified: Secondary | ICD-10-CM | POA: Diagnosis not present

## 2023-04-13 DIAGNOSIS — I69398 Other sequelae of cerebral infarction: Secondary | ICD-10-CM | POA: Diagnosis not present

## 2023-04-16 DIAGNOSIS — M1990 Unspecified osteoarthritis, unspecified site: Secondary | ICD-10-CM | POA: Diagnosis not present

## 2023-04-16 DIAGNOSIS — R531 Weakness: Secondary | ICD-10-CM | POA: Diagnosis not present

## 2023-04-16 DIAGNOSIS — I129 Hypertensive chronic kidney disease with stage 1 through stage 4 chronic kidney disease, or unspecified chronic kidney disease: Secondary | ICD-10-CM | POA: Diagnosis not present

## 2023-04-16 DIAGNOSIS — Z9181 History of falling: Secondary | ICD-10-CM | POA: Diagnosis not present

## 2023-04-16 DIAGNOSIS — M329 Systemic lupus erythematosus, unspecified: Secondary | ICD-10-CM | POA: Diagnosis not present

## 2023-04-16 DIAGNOSIS — G894 Chronic pain syndrome: Secondary | ICD-10-CM | POA: Diagnosis not present

## 2023-04-16 DIAGNOSIS — G35 Multiple sclerosis: Secondary | ICD-10-CM | POA: Diagnosis not present

## 2023-04-16 DIAGNOSIS — M069 Rheumatoid arthritis, unspecified: Secondary | ICD-10-CM | POA: Diagnosis not present

## 2023-04-16 DIAGNOSIS — E1122 Type 2 diabetes mellitus with diabetic chronic kidney disease: Secondary | ICD-10-CM | POA: Diagnosis not present

## 2023-04-16 DIAGNOSIS — N183 Chronic kidney disease, stage 3 unspecified: Secondary | ICD-10-CM | POA: Diagnosis not present

## 2023-04-16 DIAGNOSIS — E785 Hyperlipidemia, unspecified: Secondary | ICD-10-CM | POA: Diagnosis not present

## 2023-04-16 DIAGNOSIS — Z604 Social exclusion and rejection: Secondary | ICD-10-CM | POA: Diagnosis not present

## 2023-04-16 DIAGNOSIS — K5900 Constipation, unspecified: Secondary | ICD-10-CM | POA: Diagnosis not present

## 2023-04-16 DIAGNOSIS — K219 Gastro-esophageal reflux disease without esophagitis: Secondary | ICD-10-CM | POA: Diagnosis not present

## 2023-04-16 DIAGNOSIS — I69398 Other sequelae of cerebral infarction: Secondary | ICD-10-CM | POA: Diagnosis not present

## 2023-04-16 DIAGNOSIS — Z791 Long term (current) use of non-steroidal anti-inflammatories (NSAID): Secondary | ICD-10-CM | POA: Diagnosis not present

## 2023-04-16 DIAGNOSIS — K5090 Crohn's disease, unspecified, without complications: Secondary | ICD-10-CM | POA: Diagnosis not present

## 2023-04-23 DIAGNOSIS — K219 Gastro-esophageal reflux disease without esophagitis: Secondary | ICD-10-CM | POA: Diagnosis not present

## 2023-04-23 DIAGNOSIS — R531 Weakness: Secondary | ICD-10-CM | POA: Diagnosis not present

## 2023-04-23 DIAGNOSIS — M069 Rheumatoid arthritis, unspecified: Secondary | ICD-10-CM | POA: Diagnosis not present

## 2023-04-23 DIAGNOSIS — Z604 Social exclusion and rejection: Secondary | ICD-10-CM | POA: Diagnosis not present

## 2023-04-23 DIAGNOSIS — I129 Hypertensive chronic kidney disease with stage 1 through stage 4 chronic kidney disease, or unspecified chronic kidney disease: Secondary | ICD-10-CM | POA: Diagnosis not present

## 2023-04-23 DIAGNOSIS — G894 Chronic pain syndrome: Secondary | ICD-10-CM | POA: Diagnosis not present

## 2023-04-23 DIAGNOSIS — K5090 Crohn's disease, unspecified, without complications: Secondary | ICD-10-CM | POA: Diagnosis not present

## 2023-04-23 DIAGNOSIS — K5900 Constipation, unspecified: Secondary | ICD-10-CM | POA: Diagnosis not present

## 2023-04-23 DIAGNOSIS — E785 Hyperlipidemia, unspecified: Secondary | ICD-10-CM | POA: Diagnosis not present

## 2023-04-23 DIAGNOSIS — E1122 Type 2 diabetes mellitus with diabetic chronic kidney disease: Secondary | ICD-10-CM | POA: Diagnosis not present

## 2023-04-23 DIAGNOSIS — G35 Multiple sclerosis: Secondary | ICD-10-CM | POA: Diagnosis not present

## 2023-04-23 DIAGNOSIS — Z791 Long term (current) use of non-steroidal anti-inflammatories (NSAID): Secondary | ICD-10-CM | POA: Diagnosis not present

## 2023-04-23 DIAGNOSIS — N183 Chronic kidney disease, stage 3 unspecified: Secondary | ICD-10-CM | POA: Diagnosis not present

## 2023-04-23 DIAGNOSIS — I69398 Other sequelae of cerebral infarction: Secondary | ICD-10-CM | POA: Diagnosis not present

## 2023-04-23 DIAGNOSIS — M329 Systemic lupus erythematosus, unspecified: Secondary | ICD-10-CM | POA: Diagnosis not present

## 2023-04-23 DIAGNOSIS — Z9181 History of falling: Secondary | ICD-10-CM | POA: Diagnosis not present

## 2023-04-23 DIAGNOSIS — M1990 Unspecified osteoarthritis, unspecified site: Secondary | ICD-10-CM | POA: Diagnosis not present

## 2023-04-28 DIAGNOSIS — G35 Multiple sclerosis: Secondary | ICD-10-CM | POA: Diagnosis not present

## 2023-04-28 DIAGNOSIS — M5416 Radiculopathy, lumbar region: Secondary | ICD-10-CM | POA: Diagnosis not present

## 2023-04-28 DIAGNOSIS — Z1389 Encounter for screening for other disorder: Secondary | ICD-10-CM | POA: Diagnosis not present

## 2023-04-28 DIAGNOSIS — M47816 Spondylosis without myelopathy or radiculopathy, lumbar region: Secondary | ICD-10-CM | POA: Diagnosis not present

## 2023-04-28 DIAGNOSIS — M329 Systemic lupus erythematosus, unspecified: Secondary | ICD-10-CM | POA: Diagnosis not present

## 2023-04-28 DIAGNOSIS — R4702 Dysphasia: Secondary | ICD-10-CM | POA: Diagnosis not present

## 2023-04-28 DIAGNOSIS — R519 Headache, unspecified: Secondary | ICD-10-CM | POA: Diagnosis not present

## 2023-04-28 DIAGNOSIS — G894 Chronic pain syndrome: Secondary | ICD-10-CM | POA: Diagnosis not present

## 2023-04-30 DIAGNOSIS — E785 Hyperlipidemia, unspecified: Secondary | ICD-10-CM | POA: Diagnosis not present

## 2023-04-30 DIAGNOSIS — G35 Multiple sclerosis: Secondary | ICD-10-CM | POA: Diagnosis not present

## 2023-04-30 DIAGNOSIS — Z791 Long term (current) use of non-steroidal anti-inflammatories (NSAID): Secondary | ICD-10-CM | POA: Diagnosis not present

## 2023-04-30 DIAGNOSIS — I129 Hypertensive chronic kidney disease with stage 1 through stage 4 chronic kidney disease, or unspecified chronic kidney disease: Secondary | ICD-10-CM | POA: Diagnosis not present

## 2023-04-30 DIAGNOSIS — R531 Weakness: Secondary | ICD-10-CM | POA: Diagnosis not present

## 2023-04-30 DIAGNOSIS — I69398 Other sequelae of cerebral infarction: Secondary | ICD-10-CM | POA: Diagnosis not present

## 2023-04-30 DIAGNOSIS — M1990 Unspecified osteoarthritis, unspecified site: Secondary | ICD-10-CM | POA: Diagnosis not present

## 2023-04-30 DIAGNOSIS — Z9181 History of falling: Secondary | ICD-10-CM | POA: Diagnosis not present

## 2023-04-30 DIAGNOSIS — G894 Chronic pain syndrome: Secondary | ICD-10-CM | POA: Diagnosis not present

## 2023-04-30 DIAGNOSIS — K5090 Crohn's disease, unspecified, without complications: Secondary | ICD-10-CM | POA: Diagnosis not present

## 2023-04-30 DIAGNOSIS — N183 Chronic kidney disease, stage 3 unspecified: Secondary | ICD-10-CM | POA: Diagnosis not present

## 2023-04-30 DIAGNOSIS — K5900 Constipation, unspecified: Secondary | ICD-10-CM | POA: Diagnosis not present

## 2023-04-30 DIAGNOSIS — K219 Gastro-esophageal reflux disease without esophagitis: Secondary | ICD-10-CM | POA: Diagnosis not present

## 2023-04-30 DIAGNOSIS — M329 Systemic lupus erythematosus, unspecified: Secondary | ICD-10-CM | POA: Diagnosis not present

## 2023-04-30 DIAGNOSIS — E1122 Type 2 diabetes mellitus with diabetic chronic kidney disease: Secondary | ICD-10-CM | POA: Diagnosis not present

## 2023-04-30 DIAGNOSIS — Z604 Social exclusion and rejection: Secondary | ICD-10-CM | POA: Diagnosis not present

## 2023-04-30 DIAGNOSIS — M069 Rheumatoid arthritis, unspecified: Secondary | ICD-10-CM | POA: Diagnosis not present

## 2023-05-04 DIAGNOSIS — M05742 Rheumatoid arthritis with rheumatoid factor of left hand without organ or systems involvement: Secondary | ICD-10-CM | POA: Diagnosis not present

## 2023-05-04 DIAGNOSIS — M05741 Rheumatoid arthritis with rheumatoid factor of right hand without organ or systems involvement: Secondary | ICD-10-CM | POA: Diagnosis not present

## 2023-05-04 DIAGNOSIS — Z79899 Other long term (current) drug therapy: Secondary | ICD-10-CM | POA: Diagnosis not present

## 2023-05-05 DIAGNOSIS — I639 Cerebral infarction, unspecified: Secondary | ICD-10-CM | POA: Diagnosis not present

## 2023-05-06 DIAGNOSIS — R531 Weakness: Secondary | ICD-10-CM | POA: Diagnosis not present

## 2023-05-06 DIAGNOSIS — G894 Chronic pain syndrome: Secondary | ICD-10-CM | POA: Diagnosis not present

## 2023-05-06 DIAGNOSIS — Z604 Social exclusion and rejection: Secondary | ICD-10-CM | POA: Diagnosis not present

## 2023-05-06 DIAGNOSIS — K5090 Crohn's disease, unspecified, without complications: Secondary | ICD-10-CM | POA: Diagnosis not present

## 2023-05-06 DIAGNOSIS — N183 Chronic kidney disease, stage 3 unspecified: Secondary | ICD-10-CM | POA: Diagnosis not present

## 2023-05-06 DIAGNOSIS — M1990 Unspecified osteoarthritis, unspecified site: Secondary | ICD-10-CM | POA: Diagnosis not present

## 2023-05-06 DIAGNOSIS — Z791 Long term (current) use of non-steroidal anti-inflammatories (NSAID): Secondary | ICD-10-CM | POA: Diagnosis not present

## 2023-05-06 DIAGNOSIS — I129 Hypertensive chronic kidney disease with stage 1 through stage 4 chronic kidney disease, or unspecified chronic kidney disease: Secondary | ICD-10-CM | POA: Diagnosis not present

## 2023-05-06 DIAGNOSIS — K219 Gastro-esophageal reflux disease without esophagitis: Secondary | ICD-10-CM | POA: Diagnosis not present

## 2023-05-06 DIAGNOSIS — K5900 Constipation, unspecified: Secondary | ICD-10-CM | POA: Diagnosis not present

## 2023-05-06 DIAGNOSIS — E1122 Type 2 diabetes mellitus with diabetic chronic kidney disease: Secondary | ICD-10-CM | POA: Diagnosis not present

## 2023-05-06 DIAGNOSIS — Z9181 History of falling: Secondary | ICD-10-CM | POA: Diagnosis not present

## 2023-05-06 DIAGNOSIS — I69398 Other sequelae of cerebral infarction: Secondary | ICD-10-CM | POA: Diagnosis not present

## 2023-05-06 DIAGNOSIS — E785 Hyperlipidemia, unspecified: Secondary | ICD-10-CM | POA: Diagnosis not present

## 2023-05-06 DIAGNOSIS — M069 Rheumatoid arthritis, unspecified: Secondary | ICD-10-CM | POA: Diagnosis not present

## 2023-05-06 DIAGNOSIS — G35 Multiple sclerosis: Secondary | ICD-10-CM | POA: Diagnosis not present

## 2023-05-06 DIAGNOSIS — M329 Systemic lupus erythematosus, unspecified: Secondary | ICD-10-CM | POA: Diagnosis not present

## 2023-05-11 DIAGNOSIS — N183 Chronic kidney disease, stage 3 unspecified: Secondary | ICD-10-CM | POA: Diagnosis not present

## 2023-05-11 DIAGNOSIS — M069 Rheumatoid arthritis, unspecified: Secondary | ICD-10-CM | POA: Diagnosis not present

## 2023-05-11 DIAGNOSIS — K5900 Constipation, unspecified: Secondary | ICD-10-CM | POA: Diagnosis not present

## 2023-05-11 DIAGNOSIS — Z604 Social exclusion and rejection: Secondary | ICD-10-CM | POA: Diagnosis not present

## 2023-05-11 DIAGNOSIS — Z9181 History of falling: Secondary | ICD-10-CM | POA: Diagnosis not present

## 2023-05-11 DIAGNOSIS — Z791 Long term (current) use of non-steroidal anti-inflammatories (NSAID): Secondary | ICD-10-CM | POA: Diagnosis not present

## 2023-05-11 DIAGNOSIS — G35 Multiple sclerosis: Secondary | ICD-10-CM | POA: Diagnosis not present

## 2023-05-11 DIAGNOSIS — M329 Systemic lupus erythematosus, unspecified: Secondary | ICD-10-CM | POA: Diagnosis not present

## 2023-05-11 DIAGNOSIS — R531 Weakness: Secondary | ICD-10-CM | POA: Diagnosis not present

## 2023-05-11 DIAGNOSIS — I69398 Other sequelae of cerebral infarction: Secondary | ICD-10-CM | POA: Diagnosis not present

## 2023-05-11 DIAGNOSIS — I129 Hypertensive chronic kidney disease with stage 1 through stage 4 chronic kidney disease, or unspecified chronic kidney disease: Secondary | ICD-10-CM | POA: Diagnosis not present

## 2023-05-11 DIAGNOSIS — E1122 Type 2 diabetes mellitus with diabetic chronic kidney disease: Secondary | ICD-10-CM | POA: Diagnosis not present

## 2023-05-11 DIAGNOSIS — M1990 Unspecified osteoarthritis, unspecified site: Secondary | ICD-10-CM | POA: Diagnosis not present

## 2023-05-11 DIAGNOSIS — E785 Hyperlipidemia, unspecified: Secondary | ICD-10-CM | POA: Diagnosis not present

## 2023-05-11 DIAGNOSIS — K5090 Crohn's disease, unspecified, without complications: Secondary | ICD-10-CM | POA: Diagnosis not present

## 2023-05-11 DIAGNOSIS — G894 Chronic pain syndrome: Secondary | ICD-10-CM | POA: Diagnosis not present

## 2023-05-11 DIAGNOSIS — K219 Gastro-esophageal reflux disease without esophagitis: Secondary | ICD-10-CM | POA: Diagnosis not present

## 2023-05-13 DIAGNOSIS — M1990 Unspecified osteoarthritis, unspecified site: Secondary | ICD-10-CM | POA: Diagnosis not present

## 2023-05-13 DIAGNOSIS — E785 Hyperlipidemia, unspecified: Secondary | ICD-10-CM | POA: Diagnosis not present

## 2023-05-13 DIAGNOSIS — G894 Chronic pain syndrome: Secondary | ICD-10-CM | POA: Diagnosis not present

## 2023-05-13 DIAGNOSIS — M329 Systemic lupus erythematosus, unspecified: Secondary | ICD-10-CM | POA: Diagnosis not present

## 2023-05-13 DIAGNOSIS — K5090 Crohn's disease, unspecified, without complications: Secondary | ICD-10-CM | POA: Diagnosis not present

## 2023-05-13 DIAGNOSIS — Z9181 History of falling: Secondary | ICD-10-CM | POA: Diagnosis not present

## 2023-05-13 DIAGNOSIS — G43709 Chronic migraine without aura, not intractable, without status migrainosus: Secondary | ICD-10-CM | POA: Diagnosis not present

## 2023-05-13 DIAGNOSIS — I129 Hypertensive chronic kidney disease with stage 1 through stage 4 chronic kidney disease, or unspecified chronic kidney disease: Secondary | ICD-10-CM | POA: Diagnosis not present

## 2023-05-13 DIAGNOSIS — M069 Rheumatoid arthritis, unspecified: Secondary | ICD-10-CM | POA: Diagnosis not present

## 2023-05-13 DIAGNOSIS — R531 Weakness: Secondary | ICD-10-CM | POA: Diagnosis not present

## 2023-05-13 DIAGNOSIS — Z604 Social exclusion and rejection: Secondary | ICD-10-CM | POA: Diagnosis not present

## 2023-05-13 DIAGNOSIS — K5900 Constipation, unspecified: Secondary | ICD-10-CM | POA: Diagnosis not present

## 2023-05-13 DIAGNOSIS — I69398 Other sequelae of cerebral infarction: Secondary | ICD-10-CM | POA: Diagnosis not present

## 2023-05-13 DIAGNOSIS — Z791 Long term (current) use of non-steroidal anti-inflammatories (NSAID): Secondary | ICD-10-CM | POA: Diagnosis not present

## 2023-05-13 DIAGNOSIS — K219 Gastro-esophageal reflux disease without esophagitis: Secondary | ICD-10-CM | POA: Diagnosis not present

## 2023-05-13 DIAGNOSIS — G35 Multiple sclerosis: Secondary | ICD-10-CM | POA: Diagnosis not present

## 2023-05-13 DIAGNOSIS — E1122 Type 2 diabetes mellitus with diabetic chronic kidney disease: Secondary | ICD-10-CM | POA: Diagnosis not present

## 2023-05-13 DIAGNOSIS — N183 Chronic kidney disease, stage 3 unspecified: Secondary | ICD-10-CM | POA: Diagnosis not present

## 2023-05-14 DIAGNOSIS — Z604 Social exclusion and rejection: Secondary | ICD-10-CM | POA: Diagnosis not present

## 2023-05-14 DIAGNOSIS — K219 Gastro-esophageal reflux disease without esophagitis: Secondary | ICD-10-CM | POA: Diagnosis not present

## 2023-05-14 DIAGNOSIS — I69398 Other sequelae of cerebral infarction: Secondary | ICD-10-CM | POA: Diagnosis not present

## 2023-05-14 DIAGNOSIS — N183 Chronic kidney disease, stage 3 unspecified: Secondary | ICD-10-CM | POA: Diagnosis not present

## 2023-05-14 DIAGNOSIS — Z9181 History of falling: Secondary | ICD-10-CM | POA: Diagnosis not present

## 2023-05-14 DIAGNOSIS — R531 Weakness: Secondary | ICD-10-CM | POA: Diagnosis not present

## 2023-05-14 DIAGNOSIS — M329 Systemic lupus erythematosus, unspecified: Secondary | ICD-10-CM | POA: Diagnosis not present

## 2023-05-14 DIAGNOSIS — I129 Hypertensive chronic kidney disease with stage 1 through stage 4 chronic kidney disease, or unspecified chronic kidney disease: Secondary | ICD-10-CM | POA: Diagnosis not present

## 2023-05-14 DIAGNOSIS — M1990 Unspecified osteoarthritis, unspecified site: Secondary | ICD-10-CM | POA: Diagnosis not present

## 2023-05-14 DIAGNOSIS — G894 Chronic pain syndrome: Secondary | ICD-10-CM | POA: Diagnosis not present

## 2023-05-14 DIAGNOSIS — K5090 Crohn's disease, unspecified, without complications: Secondary | ICD-10-CM | POA: Diagnosis not present

## 2023-05-14 DIAGNOSIS — K5900 Constipation, unspecified: Secondary | ICD-10-CM | POA: Diagnosis not present

## 2023-05-14 DIAGNOSIS — G35 Multiple sclerosis: Secondary | ICD-10-CM | POA: Diagnosis not present

## 2023-05-14 DIAGNOSIS — E785 Hyperlipidemia, unspecified: Secondary | ICD-10-CM | POA: Diagnosis not present

## 2023-05-14 DIAGNOSIS — M069 Rheumatoid arthritis, unspecified: Secondary | ICD-10-CM | POA: Diagnosis not present

## 2023-05-14 DIAGNOSIS — E1122 Type 2 diabetes mellitus with diabetic chronic kidney disease: Secondary | ICD-10-CM | POA: Diagnosis not present

## 2023-05-14 DIAGNOSIS — Z791 Long term (current) use of non-steroidal anti-inflammatories (NSAID): Secondary | ICD-10-CM | POA: Diagnosis not present

## 2023-05-18 DIAGNOSIS — E538 Deficiency of other specified B group vitamins: Secondary | ICD-10-CM | POA: Diagnosis not present

## 2023-05-18 DIAGNOSIS — E1169 Type 2 diabetes mellitus with other specified complication: Secondary | ICD-10-CM | POA: Diagnosis not present

## 2023-05-18 DIAGNOSIS — I10 Essential (primary) hypertension: Secondary | ICD-10-CM | POA: Diagnosis not present

## 2023-05-18 DIAGNOSIS — Z79899 Other long term (current) drug therapy: Secondary | ICD-10-CM | POA: Diagnosis not present

## 2023-05-18 DIAGNOSIS — E785 Hyperlipidemia, unspecified: Secondary | ICD-10-CM | POA: Diagnosis not present

## 2023-05-18 DIAGNOSIS — R42 Dizziness and giddiness: Secondary | ICD-10-CM | POA: Diagnosis not present

## 2023-05-19 DIAGNOSIS — G35 Multiple sclerosis: Secondary | ICD-10-CM | POA: Diagnosis not present

## 2023-05-19 DIAGNOSIS — M1990 Unspecified osteoarthritis, unspecified site: Secondary | ICD-10-CM | POA: Diagnosis not present

## 2023-05-19 DIAGNOSIS — R531 Weakness: Secondary | ICD-10-CM | POA: Diagnosis not present

## 2023-05-19 DIAGNOSIS — K5090 Crohn's disease, unspecified, without complications: Secondary | ICD-10-CM | POA: Diagnosis not present

## 2023-05-19 DIAGNOSIS — K5900 Constipation, unspecified: Secondary | ICD-10-CM | POA: Diagnosis not present

## 2023-05-19 DIAGNOSIS — Z9181 History of falling: Secondary | ICD-10-CM | POA: Diagnosis not present

## 2023-05-19 DIAGNOSIS — N183 Chronic kidney disease, stage 3 unspecified: Secondary | ICD-10-CM | POA: Diagnosis not present

## 2023-05-19 DIAGNOSIS — E785 Hyperlipidemia, unspecified: Secondary | ICD-10-CM | POA: Diagnosis not present

## 2023-05-19 DIAGNOSIS — Z604 Social exclusion and rejection: Secondary | ICD-10-CM | POA: Diagnosis not present

## 2023-05-19 DIAGNOSIS — E1122 Type 2 diabetes mellitus with diabetic chronic kidney disease: Secondary | ICD-10-CM | POA: Diagnosis not present

## 2023-05-19 DIAGNOSIS — K219 Gastro-esophageal reflux disease without esophagitis: Secondary | ICD-10-CM | POA: Diagnosis not present

## 2023-05-19 DIAGNOSIS — I69398 Other sequelae of cerebral infarction: Secondary | ICD-10-CM | POA: Diagnosis not present

## 2023-05-19 DIAGNOSIS — M329 Systemic lupus erythematosus, unspecified: Secondary | ICD-10-CM | POA: Diagnosis not present

## 2023-05-19 DIAGNOSIS — I129 Hypertensive chronic kidney disease with stage 1 through stage 4 chronic kidney disease, or unspecified chronic kidney disease: Secondary | ICD-10-CM | POA: Diagnosis not present

## 2023-05-19 DIAGNOSIS — Z791 Long term (current) use of non-steroidal anti-inflammatories (NSAID): Secondary | ICD-10-CM | POA: Diagnosis not present

## 2023-05-19 DIAGNOSIS — M069 Rheumatoid arthritis, unspecified: Secondary | ICD-10-CM | POA: Diagnosis not present

## 2023-05-19 DIAGNOSIS — G894 Chronic pain syndrome: Secondary | ICD-10-CM | POA: Diagnosis not present

## 2023-05-26 DIAGNOSIS — M47816 Spondylosis without myelopathy or radiculopathy, lumbar region: Secondary | ICD-10-CM | POA: Diagnosis not present

## 2023-05-26 DIAGNOSIS — M5416 Radiculopathy, lumbar region: Secondary | ICD-10-CM | POA: Diagnosis not present

## 2023-05-26 DIAGNOSIS — M329 Systemic lupus erythematosus, unspecified: Secondary | ICD-10-CM | POA: Diagnosis not present

## 2023-05-26 DIAGNOSIS — R4702 Dysphasia: Secondary | ICD-10-CM | POA: Diagnosis not present

## 2023-05-26 DIAGNOSIS — R519 Headache, unspecified: Secondary | ICD-10-CM | POA: Diagnosis not present

## 2023-05-26 DIAGNOSIS — Z1389 Encounter for screening for other disorder: Secondary | ICD-10-CM | POA: Diagnosis not present

## 2023-05-26 DIAGNOSIS — G35 Multiple sclerosis: Secondary | ICD-10-CM | POA: Diagnosis not present

## 2023-05-28 DIAGNOSIS — M069 Rheumatoid arthritis, unspecified: Secondary | ICD-10-CM | POA: Diagnosis not present

## 2023-05-28 DIAGNOSIS — Z604 Social exclusion and rejection: Secondary | ICD-10-CM | POA: Diagnosis not present

## 2023-05-28 DIAGNOSIS — E1122 Type 2 diabetes mellitus with diabetic chronic kidney disease: Secondary | ICD-10-CM | POA: Diagnosis not present

## 2023-05-28 DIAGNOSIS — M329 Systemic lupus erythematosus, unspecified: Secondary | ICD-10-CM | POA: Diagnosis not present

## 2023-05-28 DIAGNOSIS — E785 Hyperlipidemia, unspecified: Secondary | ICD-10-CM | POA: Diagnosis not present

## 2023-05-28 DIAGNOSIS — I129 Hypertensive chronic kidney disease with stage 1 through stage 4 chronic kidney disease, or unspecified chronic kidney disease: Secondary | ICD-10-CM | POA: Diagnosis not present

## 2023-05-28 DIAGNOSIS — R531 Weakness: Secondary | ICD-10-CM | POA: Diagnosis not present

## 2023-05-28 DIAGNOSIS — K5900 Constipation, unspecified: Secondary | ICD-10-CM | POA: Diagnosis not present

## 2023-05-28 DIAGNOSIS — K219 Gastro-esophageal reflux disease without esophagitis: Secondary | ICD-10-CM | POA: Diagnosis not present

## 2023-05-28 DIAGNOSIS — G35 Multiple sclerosis: Secondary | ICD-10-CM | POA: Diagnosis not present

## 2023-05-28 DIAGNOSIS — G894 Chronic pain syndrome: Secondary | ICD-10-CM | POA: Diagnosis not present

## 2023-05-28 DIAGNOSIS — N183 Chronic kidney disease, stage 3 unspecified: Secondary | ICD-10-CM | POA: Diagnosis not present

## 2023-05-28 DIAGNOSIS — M1990 Unspecified osteoarthritis, unspecified site: Secondary | ICD-10-CM | POA: Diagnosis not present

## 2023-05-28 DIAGNOSIS — Z9181 History of falling: Secondary | ICD-10-CM | POA: Diagnosis not present

## 2023-05-28 DIAGNOSIS — I69398 Other sequelae of cerebral infarction: Secondary | ICD-10-CM | POA: Diagnosis not present

## 2023-05-28 DIAGNOSIS — K5090 Crohn's disease, unspecified, without complications: Secondary | ICD-10-CM | POA: Diagnosis not present

## 2023-05-28 DIAGNOSIS — Z791 Long term (current) use of non-steroidal anti-inflammatories (NSAID): Secondary | ICD-10-CM | POA: Diagnosis not present

## 2023-06-02 DIAGNOSIS — N183 Chronic kidney disease, stage 3 unspecified: Secondary | ICD-10-CM | POA: Diagnosis not present

## 2023-06-02 DIAGNOSIS — G894 Chronic pain syndrome: Secondary | ICD-10-CM | POA: Diagnosis not present

## 2023-06-02 DIAGNOSIS — K5090 Crohn's disease, unspecified, without complications: Secondary | ICD-10-CM | POA: Diagnosis not present

## 2023-06-02 DIAGNOSIS — E1122 Type 2 diabetes mellitus with diabetic chronic kidney disease: Secondary | ICD-10-CM | POA: Diagnosis not present

## 2023-06-02 DIAGNOSIS — R531 Weakness: Secondary | ICD-10-CM | POA: Diagnosis not present

## 2023-06-02 DIAGNOSIS — I129 Hypertensive chronic kidney disease with stage 1 through stage 4 chronic kidney disease, or unspecified chronic kidney disease: Secondary | ICD-10-CM | POA: Diagnosis not present

## 2023-06-02 DIAGNOSIS — Z604 Social exclusion and rejection: Secondary | ICD-10-CM | POA: Diagnosis not present

## 2023-06-02 DIAGNOSIS — G35 Multiple sclerosis: Secondary | ICD-10-CM | POA: Diagnosis not present

## 2023-06-02 DIAGNOSIS — Z791 Long term (current) use of non-steroidal anti-inflammatories (NSAID): Secondary | ICD-10-CM | POA: Diagnosis not present

## 2023-06-02 DIAGNOSIS — K219 Gastro-esophageal reflux disease without esophagitis: Secondary | ICD-10-CM | POA: Diagnosis not present

## 2023-06-02 DIAGNOSIS — E785 Hyperlipidemia, unspecified: Secondary | ICD-10-CM | POA: Diagnosis not present

## 2023-06-02 DIAGNOSIS — I69398 Other sequelae of cerebral infarction: Secondary | ICD-10-CM | POA: Diagnosis not present

## 2023-06-02 DIAGNOSIS — M329 Systemic lupus erythematosus, unspecified: Secondary | ICD-10-CM | POA: Diagnosis not present

## 2023-06-02 DIAGNOSIS — Z9181 History of falling: Secondary | ICD-10-CM | POA: Diagnosis not present

## 2023-06-02 DIAGNOSIS — M069 Rheumatoid arthritis, unspecified: Secondary | ICD-10-CM | POA: Diagnosis not present

## 2023-06-02 DIAGNOSIS — K5900 Constipation, unspecified: Secondary | ICD-10-CM | POA: Diagnosis not present

## 2023-06-02 DIAGNOSIS — M1990 Unspecified osteoarthritis, unspecified site: Secondary | ICD-10-CM | POA: Diagnosis not present

## 2023-06-05 DIAGNOSIS — I639 Cerebral infarction, unspecified: Secondary | ICD-10-CM | POA: Diagnosis not present

## 2023-06-09 DIAGNOSIS — M329 Systemic lupus erythematosus, unspecified: Secondary | ICD-10-CM | POA: Diagnosis not present

## 2023-06-09 DIAGNOSIS — G894 Chronic pain syndrome: Secondary | ICD-10-CM | POA: Diagnosis not present

## 2023-06-09 DIAGNOSIS — M1990 Unspecified osteoarthritis, unspecified site: Secondary | ICD-10-CM | POA: Diagnosis not present

## 2023-06-09 DIAGNOSIS — Z791 Long term (current) use of non-steroidal anti-inflammatories (NSAID): Secondary | ICD-10-CM | POA: Diagnosis not present

## 2023-06-09 DIAGNOSIS — I129 Hypertensive chronic kidney disease with stage 1 through stage 4 chronic kidney disease, or unspecified chronic kidney disease: Secondary | ICD-10-CM | POA: Diagnosis not present

## 2023-06-09 DIAGNOSIS — Z604 Social exclusion and rejection: Secondary | ICD-10-CM | POA: Diagnosis not present

## 2023-06-09 DIAGNOSIS — I69398 Other sequelae of cerebral infarction: Secondary | ICD-10-CM | POA: Diagnosis not present

## 2023-06-09 DIAGNOSIS — G35 Multiple sclerosis: Secondary | ICD-10-CM | POA: Diagnosis not present

## 2023-06-09 DIAGNOSIS — E1122 Type 2 diabetes mellitus with diabetic chronic kidney disease: Secondary | ICD-10-CM | POA: Diagnosis not present

## 2023-06-09 DIAGNOSIS — R531 Weakness: Secondary | ICD-10-CM | POA: Diagnosis not present

## 2023-06-09 DIAGNOSIS — M069 Rheumatoid arthritis, unspecified: Secondary | ICD-10-CM | POA: Diagnosis not present

## 2023-06-09 DIAGNOSIS — Z9181 History of falling: Secondary | ICD-10-CM | POA: Diagnosis not present

## 2023-06-09 DIAGNOSIS — N183 Chronic kidney disease, stage 3 unspecified: Secondary | ICD-10-CM | POA: Diagnosis not present

## 2023-06-09 DIAGNOSIS — K5090 Crohn's disease, unspecified, without complications: Secondary | ICD-10-CM | POA: Diagnosis not present

## 2023-06-09 DIAGNOSIS — K219 Gastro-esophageal reflux disease without esophagitis: Secondary | ICD-10-CM | POA: Diagnosis not present

## 2023-06-09 DIAGNOSIS — E785 Hyperlipidemia, unspecified: Secondary | ICD-10-CM | POA: Diagnosis not present

## 2023-06-09 DIAGNOSIS — K5900 Constipation, unspecified: Secondary | ICD-10-CM | POA: Diagnosis not present

## 2023-06-10 DIAGNOSIS — K5090 Crohn's disease, unspecified, without complications: Secondary | ICD-10-CM | POA: Diagnosis not present

## 2023-06-10 DIAGNOSIS — M1990 Unspecified osteoarthritis, unspecified site: Secondary | ICD-10-CM | POA: Diagnosis not present

## 2023-06-10 DIAGNOSIS — G894 Chronic pain syndrome: Secondary | ICD-10-CM | POA: Diagnosis not present

## 2023-06-10 DIAGNOSIS — I129 Hypertensive chronic kidney disease with stage 1 through stage 4 chronic kidney disease, or unspecified chronic kidney disease: Secondary | ICD-10-CM | POA: Diagnosis not present

## 2023-06-10 DIAGNOSIS — G35 Multiple sclerosis: Secondary | ICD-10-CM | POA: Diagnosis not present

## 2023-06-10 DIAGNOSIS — E1122 Type 2 diabetes mellitus with diabetic chronic kidney disease: Secondary | ICD-10-CM | POA: Diagnosis not present

## 2023-06-10 DIAGNOSIS — M329 Systemic lupus erythematosus, unspecified: Secondary | ICD-10-CM | POA: Diagnosis not present

## 2023-06-10 DIAGNOSIS — Z604 Social exclusion and rejection: Secondary | ICD-10-CM | POA: Diagnosis not present

## 2023-06-10 DIAGNOSIS — K5900 Constipation, unspecified: Secondary | ICD-10-CM | POA: Diagnosis not present

## 2023-06-10 DIAGNOSIS — Z791 Long term (current) use of non-steroidal anti-inflammatories (NSAID): Secondary | ICD-10-CM | POA: Diagnosis not present

## 2023-06-10 DIAGNOSIS — Z9181 History of falling: Secondary | ICD-10-CM | POA: Diagnosis not present

## 2023-06-10 DIAGNOSIS — N183 Chronic kidney disease, stage 3 unspecified: Secondary | ICD-10-CM | POA: Diagnosis not present

## 2023-06-10 DIAGNOSIS — I69398 Other sequelae of cerebral infarction: Secondary | ICD-10-CM | POA: Diagnosis not present

## 2023-06-10 DIAGNOSIS — R531 Weakness: Secondary | ICD-10-CM | POA: Diagnosis not present

## 2023-06-10 DIAGNOSIS — E785 Hyperlipidemia, unspecified: Secondary | ICD-10-CM | POA: Diagnosis not present

## 2023-06-10 DIAGNOSIS — M069 Rheumatoid arthritis, unspecified: Secondary | ICD-10-CM | POA: Diagnosis not present

## 2023-06-10 DIAGNOSIS — K219 Gastro-esophageal reflux disease without esophagitis: Secondary | ICD-10-CM | POA: Diagnosis not present

## 2023-06-14 DIAGNOSIS — Z604 Social exclusion and rejection: Secondary | ICD-10-CM | POA: Diagnosis not present

## 2023-06-14 DIAGNOSIS — M329 Systemic lupus erythematosus, unspecified: Secondary | ICD-10-CM | POA: Diagnosis not present

## 2023-06-14 DIAGNOSIS — I129 Hypertensive chronic kidney disease with stage 1 through stage 4 chronic kidney disease, or unspecified chronic kidney disease: Secondary | ICD-10-CM | POA: Diagnosis not present

## 2023-06-14 DIAGNOSIS — I69398 Other sequelae of cerebral infarction: Secondary | ICD-10-CM | POA: Diagnosis not present

## 2023-06-14 DIAGNOSIS — K5090 Crohn's disease, unspecified, without complications: Secondary | ICD-10-CM | POA: Diagnosis not present

## 2023-06-14 DIAGNOSIS — N183 Chronic kidney disease, stage 3 unspecified: Secondary | ICD-10-CM | POA: Diagnosis not present

## 2023-06-14 DIAGNOSIS — M069 Rheumatoid arthritis, unspecified: Secondary | ICD-10-CM | POA: Diagnosis not present

## 2023-06-14 DIAGNOSIS — E785 Hyperlipidemia, unspecified: Secondary | ICD-10-CM | POA: Diagnosis not present

## 2023-06-14 DIAGNOSIS — Z791 Long term (current) use of non-steroidal anti-inflammatories (NSAID): Secondary | ICD-10-CM | POA: Diagnosis not present

## 2023-06-14 DIAGNOSIS — K219 Gastro-esophageal reflux disease without esophagitis: Secondary | ICD-10-CM | POA: Diagnosis not present

## 2023-06-14 DIAGNOSIS — M1990 Unspecified osteoarthritis, unspecified site: Secondary | ICD-10-CM | POA: Diagnosis not present

## 2023-06-14 DIAGNOSIS — G35 Multiple sclerosis: Secondary | ICD-10-CM | POA: Diagnosis not present

## 2023-06-14 DIAGNOSIS — R531 Weakness: Secondary | ICD-10-CM | POA: Diagnosis not present

## 2023-06-14 DIAGNOSIS — Z9181 History of falling: Secondary | ICD-10-CM | POA: Diagnosis not present

## 2023-06-14 DIAGNOSIS — E1122 Type 2 diabetes mellitus with diabetic chronic kidney disease: Secondary | ICD-10-CM | POA: Diagnosis not present

## 2023-06-14 DIAGNOSIS — G894 Chronic pain syndrome: Secondary | ICD-10-CM | POA: Diagnosis not present

## 2023-06-14 DIAGNOSIS — K5900 Constipation, unspecified: Secondary | ICD-10-CM | POA: Diagnosis not present

## 2023-06-16 DIAGNOSIS — R9401 Abnormal electroencephalogram [EEG]: Secondary | ICD-10-CM | POA: Diagnosis not present

## 2023-06-16 DIAGNOSIS — R569 Unspecified convulsions: Secondary | ICD-10-CM | POA: Diagnosis not present

## 2023-06-17 DIAGNOSIS — E1122 Type 2 diabetes mellitus with diabetic chronic kidney disease: Secondary | ICD-10-CM | POA: Diagnosis not present

## 2023-06-17 DIAGNOSIS — R531 Weakness: Secondary | ICD-10-CM | POA: Diagnosis not present

## 2023-06-17 DIAGNOSIS — I69398 Other sequelae of cerebral infarction: Secondary | ICD-10-CM | POA: Diagnosis not present

## 2023-06-17 DIAGNOSIS — M1990 Unspecified osteoarthritis, unspecified site: Secondary | ICD-10-CM | POA: Diagnosis not present

## 2023-06-17 DIAGNOSIS — K219 Gastro-esophageal reflux disease without esophagitis: Secondary | ICD-10-CM | POA: Diagnosis not present

## 2023-06-17 DIAGNOSIS — Z9181 History of falling: Secondary | ICD-10-CM | POA: Diagnosis not present

## 2023-06-17 DIAGNOSIS — N183 Chronic kidney disease, stage 3 unspecified: Secondary | ICD-10-CM | POA: Diagnosis not present

## 2023-06-17 DIAGNOSIS — I129 Hypertensive chronic kidney disease with stage 1 through stage 4 chronic kidney disease, or unspecified chronic kidney disease: Secondary | ICD-10-CM | POA: Diagnosis not present

## 2023-06-17 DIAGNOSIS — Z604 Social exclusion and rejection: Secondary | ICD-10-CM | POA: Diagnosis not present

## 2023-06-17 DIAGNOSIS — M329 Systemic lupus erythematosus, unspecified: Secondary | ICD-10-CM | POA: Diagnosis not present

## 2023-06-17 DIAGNOSIS — K5090 Crohn's disease, unspecified, without complications: Secondary | ICD-10-CM | POA: Diagnosis not present

## 2023-06-17 DIAGNOSIS — E785 Hyperlipidemia, unspecified: Secondary | ICD-10-CM | POA: Diagnosis not present

## 2023-06-17 DIAGNOSIS — G35 Multiple sclerosis: Secondary | ICD-10-CM | POA: Diagnosis not present

## 2023-06-17 DIAGNOSIS — K5900 Constipation, unspecified: Secondary | ICD-10-CM | POA: Diagnosis not present

## 2023-06-17 DIAGNOSIS — G894 Chronic pain syndrome: Secondary | ICD-10-CM | POA: Diagnosis not present

## 2023-06-17 DIAGNOSIS — Z791 Long term (current) use of non-steroidal anti-inflammatories (NSAID): Secondary | ICD-10-CM | POA: Diagnosis not present

## 2023-06-17 DIAGNOSIS — M069 Rheumatoid arthritis, unspecified: Secondary | ICD-10-CM | POA: Diagnosis not present

## 2023-06-21 DIAGNOSIS — R569 Unspecified convulsions: Secondary | ICD-10-CM | POA: Diagnosis not present

## 2023-06-23 DIAGNOSIS — I69398 Other sequelae of cerebral infarction: Secondary | ICD-10-CM | POA: Diagnosis not present

## 2023-06-23 DIAGNOSIS — K5090 Crohn's disease, unspecified, without complications: Secondary | ICD-10-CM | POA: Diagnosis not present

## 2023-06-23 DIAGNOSIS — Z9181 History of falling: Secondary | ICD-10-CM | POA: Diagnosis not present

## 2023-06-23 DIAGNOSIS — G35 Multiple sclerosis: Secondary | ICD-10-CM | POA: Diagnosis not present

## 2023-06-23 DIAGNOSIS — E1122 Type 2 diabetes mellitus with diabetic chronic kidney disease: Secondary | ICD-10-CM | POA: Diagnosis not present

## 2023-06-23 DIAGNOSIS — E785 Hyperlipidemia, unspecified: Secondary | ICD-10-CM | POA: Diagnosis not present

## 2023-06-23 DIAGNOSIS — I129 Hypertensive chronic kidney disease with stage 1 through stage 4 chronic kidney disease, or unspecified chronic kidney disease: Secondary | ICD-10-CM | POA: Diagnosis not present

## 2023-06-23 DIAGNOSIS — M1990 Unspecified osteoarthritis, unspecified site: Secondary | ICD-10-CM | POA: Diagnosis not present

## 2023-06-23 DIAGNOSIS — M069 Rheumatoid arthritis, unspecified: Secondary | ICD-10-CM | POA: Diagnosis not present

## 2023-06-23 DIAGNOSIS — N183 Chronic kidney disease, stage 3 unspecified: Secondary | ICD-10-CM | POA: Diagnosis not present

## 2023-06-23 DIAGNOSIS — Z791 Long term (current) use of non-steroidal anti-inflammatories (NSAID): Secondary | ICD-10-CM | POA: Diagnosis not present

## 2023-06-23 DIAGNOSIS — K219 Gastro-esophageal reflux disease without esophagitis: Secondary | ICD-10-CM | POA: Diagnosis not present

## 2023-06-23 DIAGNOSIS — M329 Systemic lupus erythematosus, unspecified: Secondary | ICD-10-CM | POA: Diagnosis not present

## 2023-06-23 DIAGNOSIS — R531 Weakness: Secondary | ICD-10-CM | POA: Diagnosis not present

## 2023-06-23 DIAGNOSIS — Z604 Social exclusion and rejection: Secondary | ICD-10-CM | POA: Diagnosis not present

## 2023-06-23 DIAGNOSIS — G894 Chronic pain syndrome: Secondary | ICD-10-CM | POA: Diagnosis not present

## 2023-06-23 DIAGNOSIS — K5900 Constipation, unspecified: Secondary | ICD-10-CM | POA: Diagnosis not present

## 2023-06-24 DIAGNOSIS — R519 Headache, unspecified: Secondary | ICD-10-CM | POA: Diagnosis not present

## 2023-06-24 DIAGNOSIS — M5416 Radiculopathy, lumbar region: Secondary | ICD-10-CM | POA: Diagnosis not present

## 2023-06-24 DIAGNOSIS — Z1389 Encounter for screening for other disorder: Secondary | ICD-10-CM | POA: Diagnosis not present

## 2023-06-24 DIAGNOSIS — G35 Multiple sclerosis: Secondary | ICD-10-CM | POA: Diagnosis not present

## 2023-06-24 DIAGNOSIS — G894 Chronic pain syndrome: Secondary | ICD-10-CM | POA: Diagnosis not present

## 2023-06-24 DIAGNOSIS — R4702 Dysphasia: Secondary | ICD-10-CM | POA: Diagnosis not present

## 2023-06-24 DIAGNOSIS — M329 Systemic lupus erythematosus, unspecified: Secondary | ICD-10-CM | POA: Diagnosis not present

## 2023-06-24 DIAGNOSIS — Z79891 Long term (current) use of opiate analgesic: Secondary | ICD-10-CM | POA: Diagnosis not present

## 2023-06-24 DIAGNOSIS — M47816 Spondylosis without myelopathy or radiculopathy, lumbar region: Secondary | ICD-10-CM | POA: Diagnosis not present

## 2023-06-25 DIAGNOSIS — R531 Weakness: Secondary | ICD-10-CM | POA: Diagnosis not present

## 2023-06-25 DIAGNOSIS — Z791 Long term (current) use of non-steroidal anti-inflammatories (NSAID): Secondary | ICD-10-CM | POA: Diagnosis not present

## 2023-06-25 DIAGNOSIS — E785 Hyperlipidemia, unspecified: Secondary | ICD-10-CM | POA: Diagnosis not present

## 2023-06-25 DIAGNOSIS — K219 Gastro-esophageal reflux disease without esophagitis: Secondary | ICD-10-CM | POA: Diagnosis not present

## 2023-06-25 DIAGNOSIS — K5900 Constipation, unspecified: Secondary | ICD-10-CM | POA: Diagnosis not present

## 2023-06-25 DIAGNOSIS — E1122 Type 2 diabetes mellitus with diabetic chronic kidney disease: Secondary | ICD-10-CM | POA: Diagnosis not present

## 2023-06-25 DIAGNOSIS — Z604 Social exclusion and rejection: Secondary | ICD-10-CM | POA: Diagnosis not present

## 2023-06-25 DIAGNOSIS — K5090 Crohn's disease, unspecified, without complications: Secondary | ICD-10-CM | POA: Diagnosis not present

## 2023-06-25 DIAGNOSIS — M329 Systemic lupus erythematosus, unspecified: Secondary | ICD-10-CM | POA: Diagnosis not present

## 2023-06-25 DIAGNOSIS — G894 Chronic pain syndrome: Secondary | ICD-10-CM | POA: Diagnosis not present

## 2023-06-25 DIAGNOSIS — M1990 Unspecified osteoarthritis, unspecified site: Secondary | ICD-10-CM | POA: Diagnosis not present

## 2023-06-25 DIAGNOSIS — I69398 Other sequelae of cerebral infarction: Secondary | ICD-10-CM | POA: Diagnosis not present

## 2023-06-25 DIAGNOSIS — G35 Multiple sclerosis: Secondary | ICD-10-CM | POA: Diagnosis not present

## 2023-06-25 DIAGNOSIS — Z9181 History of falling: Secondary | ICD-10-CM | POA: Diagnosis not present

## 2023-06-25 DIAGNOSIS — M069 Rheumatoid arthritis, unspecified: Secondary | ICD-10-CM | POA: Diagnosis not present

## 2023-06-25 DIAGNOSIS — N183 Chronic kidney disease, stage 3 unspecified: Secondary | ICD-10-CM | POA: Diagnosis not present

## 2023-06-25 DIAGNOSIS — I129 Hypertensive chronic kidney disease with stage 1 through stage 4 chronic kidney disease, or unspecified chronic kidney disease: Secondary | ICD-10-CM | POA: Diagnosis not present

## 2023-06-30 DIAGNOSIS — E1165 Type 2 diabetes mellitus with hyperglycemia: Secondary | ICD-10-CM | POA: Diagnosis not present

## 2023-06-30 DIAGNOSIS — Z794 Long term (current) use of insulin: Secondary | ICD-10-CM | POA: Diagnosis not present

## 2023-06-30 DIAGNOSIS — Z7984 Long term (current) use of oral hypoglycemic drugs: Secondary | ICD-10-CM | POA: Diagnosis not present

## 2023-06-30 DIAGNOSIS — E1129 Type 2 diabetes mellitus with other diabetic kidney complication: Secondary | ICD-10-CM | POA: Diagnosis not present

## 2023-06-30 DIAGNOSIS — R809 Proteinuria, unspecified: Secondary | ICD-10-CM | POA: Diagnosis not present

## 2023-06-30 DIAGNOSIS — Z7985 Long-term (current) use of injectable non-insulin antidiabetic drugs: Secondary | ICD-10-CM | POA: Diagnosis not present

## 2023-06-30 DIAGNOSIS — Z978 Presence of other specified devices: Secondary | ICD-10-CM | POA: Diagnosis not present

## 2023-07-05 DIAGNOSIS — M069 Rheumatoid arthritis, unspecified: Secondary | ICD-10-CM | POA: Diagnosis not present

## 2023-07-05 DIAGNOSIS — G35 Multiple sclerosis: Secondary | ICD-10-CM | POA: Diagnosis not present

## 2023-07-05 DIAGNOSIS — E785 Hyperlipidemia, unspecified: Secondary | ICD-10-CM | POA: Diagnosis not present

## 2023-07-05 DIAGNOSIS — E1122 Type 2 diabetes mellitus with diabetic chronic kidney disease: Secondary | ICD-10-CM | POA: Diagnosis not present

## 2023-07-05 DIAGNOSIS — R531 Weakness: Secondary | ICD-10-CM | POA: Diagnosis not present

## 2023-07-05 DIAGNOSIS — Z791 Long term (current) use of non-steroidal anti-inflammatories (NSAID): Secondary | ICD-10-CM | POA: Diagnosis not present

## 2023-07-05 DIAGNOSIS — K5900 Constipation, unspecified: Secondary | ICD-10-CM | POA: Diagnosis not present

## 2023-07-05 DIAGNOSIS — G894 Chronic pain syndrome: Secondary | ICD-10-CM | POA: Diagnosis not present

## 2023-07-05 DIAGNOSIS — K219 Gastro-esophageal reflux disease without esophagitis: Secondary | ICD-10-CM | POA: Diagnosis not present

## 2023-07-05 DIAGNOSIS — Z9181 History of falling: Secondary | ICD-10-CM | POA: Diagnosis not present

## 2023-07-05 DIAGNOSIS — M329 Systemic lupus erythematosus, unspecified: Secondary | ICD-10-CM | POA: Diagnosis not present

## 2023-07-05 DIAGNOSIS — N183 Chronic kidney disease, stage 3 unspecified: Secondary | ICD-10-CM | POA: Diagnosis not present

## 2023-07-05 DIAGNOSIS — M1990 Unspecified osteoarthritis, unspecified site: Secondary | ICD-10-CM | POA: Diagnosis not present

## 2023-07-05 DIAGNOSIS — I639 Cerebral infarction, unspecified: Secondary | ICD-10-CM | POA: Diagnosis not present

## 2023-07-05 DIAGNOSIS — I129 Hypertensive chronic kidney disease with stage 1 through stage 4 chronic kidney disease, or unspecified chronic kidney disease: Secondary | ICD-10-CM | POA: Diagnosis not present

## 2023-07-05 DIAGNOSIS — I69398 Other sequelae of cerebral infarction: Secondary | ICD-10-CM | POA: Diagnosis not present

## 2023-07-05 DIAGNOSIS — K5090 Crohn's disease, unspecified, without complications: Secondary | ICD-10-CM | POA: Diagnosis not present

## 2023-07-05 DIAGNOSIS — Z604 Social exclusion and rejection: Secondary | ICD-10-CM | POA: Diagnosis not present

## 2023-07-30 DIAGNOSIS — E1122 Type 2 diabetes mellitus with diabetic chronic kidney disease: Secondary | ICD-10-CM | POA: Diagnosis not present

## 2023-07-30 DIAGNOSIS — G894 Chronic pain syndrome: Secondary | ICD-10-CM | POA: Diagnosis not present

## 2023-07-30 DIAGNOSIS — M069 Rheumatoid arthritis, unspecified: Secondary | ICD-10-CM | POA: Diagnosis not present

## 2023-07-30 DIAGNOSIS — N183 Chronic kidney disease, stage 3 unspecified: Secondary | ICD-10-CM | POA: Diagnosis not present

## 2023-07-30 DIAGNOSIS — K219 Gastro-esophageal reflux disease without esophagitis: Secondary | ICD-10-CM | POA: Diagnosis not present

## 2023-07-30 DIAGNOSIS — R531 Weakness: Secondary | ICD-10-CM | POA: Diagnosis not present

## 2023-07-30 DIAGNOSIS — Z9181 History of falling: Secondary | ICD-10-CM | POA: Diagnosis not present

## 2023-07-30 DIAGNOSIS — G35 Multiple sclerosis: Secondary | ICD-10-CM | POA: Diagnosis not present

## 2023-07-30 DIAGNOSIS — K5090 Crohn's disease, unspecified, without complications: Secondary | ICD-10-CM | POA: Diagnosis not present

## 2023-07-30 DIAGNOSIS — I129 Hypertensive chronic kidney disease with stage 1 through stage 4 chronic kidney disease, or unspecified chronic kidney disease: Secondary | ICD-10-CM | POA: Diagnosis not present

## 2023-07-30 DIAGNOSIS — M1990 Unspecified osteoarthritis, unspecified site: Secondary | ICD-10-CM | POA: Diagnosis not present

## 2023-07-30 DIAGNOSIS — E785 Hyperlipidemia, unspecified: Secondary | ICD-10-CM | POA: Diagnosis not present

## 2023-07-30 DIAGNOSIS — Z604 Social exclusion and rejection: Secondary | ICD-10-CM | POA: Diagnosis not present

## 2023-07-30 DIAGNOSIS — Z791 Long term (current) use of non-steroidal anti-inflammatories (NSAID): Secondary | ICD-10-CM | POA: Diagnosis not present

## 2023-07-30 DIAGNOSIS — I69398 Other sequelae of cerebral infarction: Secondary | ICD-10-CM | POA: Diagnosis not present

## 2023-07-30 DIAGNOSIS — M329 Systemic lupus erythematosus, unspecified: Secondary | ICD-10-CM | POA: Diagnosis not present

## 2023-07-30 DIAGNOSIS — K5900 Constipation, unspecified: Secondary | ICD-10-CM | POA: Diagnosis not present

## 2023-08-05 DIAGNOSIS — I639 Cerebral infarction, unspecified: Secondary | ICD-10-CM | POA: Diagnosis not present

## 2023-08-05 DIAGNOSIS — M5416 Radiculopathy, lumbar region: Secondary | ICD-10-CM | POA: Diagnosis not present

## 2023-08-08 ENCOUNTER — Observation Stay (HOSPITAL_COMMUNITY)

## 2023-08-08 ENCOUNTER — Inpatient Hospital Stay (HOSPITAL_COMMUNITY)
Admission: EM | Admit: 2023-08-08 | Discharge: 2023-08-13 | DRG: 312 | Disposition: A | Discharge status: Skilled Nursing Facility | Attending: Family Medicine | Admitting: Family Medicine

## 2023-08-08 ENCOUNTER — Emergency Department (HOSPITAL_COMMUNITY)

## 2023-08-08 ENCOUNTER — Other Ambulatory Visit: Payer: Self-pay

## 2023-08-08 ENCOUNTER — Encounter (HOSPITAL_COMMUNITY): Payer: Self-pay | Admitting: Emergency Medicine

## 2023-08-08 DIAGNOSIS — E785 Hyperlipidemia, unspecified: Secondary | ICD-10-CM | POA: Diagnosis not present

## 2023-08-08 DIAGNOSIS — E43 Unspecified severe protein-calorie malnutrition: Secondary | ICD-10-CM | POA: Diagnosis present

## 2023-08-08 DIAGNOSIS — Z8673 Personal history of transient ischemic attack (TIA), and cerebral infarction without residual deficits: Secondary | ICD-10-CM | POA: Diagnosis not present

## 2023-08-08 DIAGNOSIS — I6782 Cerebral ischemia: Secondary | ICD-10-CM | POA: Diagnosis not present

## 2023-08-08 DIAGNOSIS — G8929 Other chronic pain: Secondary | ICD-10-CM | POA: Diagnosis present

## 2023-08-08 DIAGNOSIS — I639 Cerebral infarction, unspecified: Secondary | ICD-10-CM | POA: Diagnosis not present

## 2023-08-08 DIAGNOSIS — K219 Gastro-esophageal reflux disease without esophagitis: Secondary | ICD-10-CM | POA: Diagnosis present

## 2023-08-08 DIAGNOSIS — Z91010 Allergy to peanuts: Secondary | ICD-10-CM

## 2023-08-08 DIAGNOSIS — E041 Nontoxic single thyroid nodule: Secondary | ICD-10-CM | POA: Diagnosis not present

## 2023-08-08 DIAGNOSIS — R29710 NIHSS score 10: Secondary | ICD-10-CM | POA: Diagnosis not present

## 2023-08-08 DIAGNOSIS — G40909 Epilepsy, unspecified, not intractable, without status epilepticus: Secondary | ICD-10-CM | POA: Diagnosis present

## 2023-08-08 DIAGNOSIS — Z8249 Family history of ischemic heart disease and other diseases of the circulatory system: Secondary | ICD-10-CM

## 2023-08-08 DIAGNOSIS — E2740 Unspecified adrenocortical insufficiency: Secondary | ICD-10-CM | POA: Diagnosis present

## 2023-08-08 DIAGNOSIS — I951 Orthostatic hypotension: Principal | ICD-10-CM | POA: Diagnosis present

## 2023-08-08 DIAGNOSIS — Z823 Family history of stroke: Secondary | ICD-10-CM

## 2023-08-08 DIAGNOSIS — Z886 Allergy status to analgesic agent status: Secondary | ICD-10-CM

## 2023-08-08 DIAGNOSIS — K5090 Crohn's disease, unspecified, without complications: Secondary | ICD-10-CM | POA: Diagnosis not present

## 2023-08-08 DIAGNOSIS — Z23 Encounter for immunization: Secondary | ICD-10-CM

## 2023-08-08 DIAGNOSIS — Z7985 Long-term (current) use of injectable non-insulin antidiabetic drugs: Secondary | ICD-10-CM

## 2023-08-08 DIAGNOSIS — Z79899 Other long term (current) drug therapy: Secondary | ICD-10-CM | POA: Diagnosis not present

## 2023-08-08 DIAGNOSIS — Z833 Family history of diabetes mellitus: Secondary | ICD-10-CM | POA: Diagnosis not present

## 2023-08-08 DIAGNOSIS — R29810 Facial weakness: Secondary | ICD-10-CM | POA: Diagnosis not present

## 2023-08-08 DIAGNOSIS — G35 Multiple sclerosis: Secondary | ICD-10-CM | POA: Diagnosis not present

## 2023-08-08 DIAGNOSIS — Z8701 Personal history of pneumonia (recurrent): Secondary | ICD-10-CM

## 2023-08-08 DIAGNOSIS — Z91013 Allergy to seafood: Secondary | ICD-10-CM

## 2023-08-08 DIAGNOSIS — E119 Type 2 diabetes mellitus without complications: Secondary | ICD-10-CM | POA: Diagnosis not present

## 2023-08-08 DIAGNOSIS — G894 Chronic pain syndrome: Secondary | ICD-10-CM | POA: Diagnosis present

## 2023-08-08 DIAGNOSIS — R55 Syncope and collapse: Secondary | ICD-10-CM | POA: Diagnosis present

## 2023-08-08 DIAGNOSIS — I10 Essential (primary) hypertension: Secondary | ICD-10-CM | POA: Diagnosis not present

## 2023-08-08 DIAGNOSIS — F17210 Nicotine dependence, cigarettes, uncomplicated: Secondary | ICD-10-CM | POA: Diagnosis not present

## 2023-08-08 DIAGNOSIS — R569 Unspecified convulsions: Secondary | ICD-10-CM | POA: Diagnosis not present

## 2023-08-08 DIAGNOSIS — Z885 Allergy status to narcotic agent status: Secondary | ICD-10-CM | POA: Diagnosis not present

## 2023-08-08 DIAGNOSIS — I63329 Cerebral infarction due to thrombosis of unspecified anterior cerebral artery: Secondary | ICD-10-CM | POA: Diagnosis not present

## 2023-08-08 DIAGNOSIS — I63513 Cerebral infarction due to unspecified occlusion or stenosis of bilateral middle cerebral arteries: Secondary | ICD-10-CM | POA: Diagnosis not present

## 2023-08-08 DIAGNOSIS — F1120 Opioid dependence, uncomplicated: Secondary | ICD-10-CM | POA: Diagnosis present

## 2023-08-08 DIAGNOSIS — Z720 Tobacco use: Secondary | ICD-10-CM | POA: Diagnosis present

## 2023-08-08 DIAGNOSIS — W19XXXA Unspecified fall, initial encounter: Secondary | ICD-10-CM | POA: Diagnosis not present

## 2023-08-08 DIAGNOSIS — G459 Transient cerebral ischemic attack, unspecified: Secondary | ICD-10-CM | POA: Diagnosis not present

## 2023-08-08 DIAGNOSIS — Z9109 Other allergy status, other than to drugs and biological substances: Secondary | ICD-10-CM

## 2023-08-08 DIAGNOSIS — Z794 Long term (current) use of insulin: Secondary | ICD-10-CM

## 2023-08-08 DIAGNOSIS — Z9049 Acquired absence of other specified parts of digestive tract: Secondary | ICD-10-CM

## 2023-08-08 DIAGNOSIS — Z791 Long term (current) use of non-steroidal anti-inflammatories (NSAID): Secondary | ICD-10-CM

## 2023-08-08 DIAGNOSIS — D696 Thrombocytopenia, unspecified: Secondary | ICD-10-CM | POA: Diagnosis present

## 2023-08-08 DIAGNOSIS — F32A Depression, unspecified: Secondary | ICD-10-CM | POA: Diagnosis present

## 2023-08-08 DIAGNOSIS — Z91018 Allergy to other foods: Secondary | ICD-10-CM

## 2023-08-08 DIAGNOSIS — M1990 Unspecified osteoarthritis, unspecified site: Secondary | ICD-10-CM | POA: Diagnosis present

## 2023-08-08 DIAGNOSIS — Z6830 Body mass index (BMI) 30.0-30.9, adult: Secondary | ICD-10-CM

## 2023-08-08 DIAGNOSIS — E669 Obesity, unspecified: Secondary | ICD-10-CM | POA: Diagnosis present

## 2023-08-08 DIAGNOSIS — R29818 Other symptoms and signs involving the nervous system: Secondary | ICD-10-CM | POA: Diagnosis not present

## 2023-08-08 DIAGNOSIS — G43909 Migraine, unspecified, not intractable, without status migrainosus: Secondary | ICD-10-CM | POA: Diagnosis present

## 2023-08-08 DIAGNOSIS — G936 Cerebral edema: Secondary | ICD-10-CM | POA: Diagnosis not present

## 2023-08-08 DIAGNOSIS — Z7984 Long term (current) use of oral hypoglycemic drugs: Secondary | ICD-10-CM | POA: Diagnosis not present

## 2023-08-08 DIAGNOSIS — Z880 Allergy status to penicillin: Secondary | ICD-10-CM

## 2023-08-08 DIAGNOSIS — R531 Weakness: Secondary | ICD-10-CM | POA: Diagnosis not present

## 2023-08-08 DIAGNOSIS — F419 Anxiety disorder, unspecified: Secondary | ICD-10-CM | POA: Diagnosis present

## 2023-08-08 DIAGNOSIS — E1165 Type 2 diabetes mellitus with hyperglycemia: Secondary | ICD-10-CM | POA: Diagnosis not present

## 2023-08-08 LAB — CBC WITH DIFFERENTIAL/PLATELET
Abs Immature Granulocytes: 0.03 10*3/uL (ref 0.00–0.07)
Basophils Absolute: 0 10*3/uL (ref 0.0–0.1)
Basophils Relative: 1 %
Eosinophils Absolute: 0.3 10*3/uL (ref 0.0–0.5)
Eosinophils Relative: 4 %
HCT: 45.2 % (ref 36.0–46.0)
Hemoglobin: 14.1 g/dL (ref 12.0–15.0)
Immature Granulocytes: 0 %
Lymphocytes Relative: 46 %
Lymphs Abs: 3.2 10*3/uL (ref 0.7–4.0)
MCH: 28.3 pg (ref 26.0–34.0)
MCHC: 31.2 g/dL (ref 30.0–36.0)
MCV: 90.6 fL (ref 80.0–100.0)
Monocytes Absolute: 0.6 10*3/uL (ref 0.1–1.0)
Monocytes Relative: 8 %
Neutro Abs: 2.9 10*3/uL (ref 1.7–7.7)
Neutrophils Relative %: 41 %
Platelets: 130 10*3/uL — ABNORMAL LOW (ref 150–400)
RBC: 4.99 MIL/uL (ref 3.87–5.11)
RDW: 13.3 % (ref 11.5–15.5)
WBC: 7.1 10*3/uL (ref 4.0–10.5)
nRBC: 0 % (ref 0.0–0.2)

## 2023-08-08 LAB — COMPREHENSIVE METABOLIC PANEL WITH GFR
ALT: 16 U/L (ref 0–44)
AST: 27 U/L (ref 15–41)
Albumin: 3.1 g/dL — ABNORMAL LOW (ref 3.5–5.0)
Alkaline Phosphatase: 56 U/L (ref 38–126)
Anion gap: 12 (ref 5–15)
BUN: 17 mg/dL (ref 6–20)
CO2: 19 mmol/L — ABNORMAL LOW (ref 22–32)
Calcium: 9.1 mg/dL (ref 8.9–10.3)
Chloride: 106 mmol/L (ref 98–111)
Creatinine, Ser: 1.13 mg/dL — ABNORMAL HIGH (ref 0.44–1.00)
GFR, Estimated: 58 mL/min — ABNORMAL LOW (ref >60)
Glucose, Bld: 134 mg/dL — ABNORMAL HIGH (ref 70–99)
Potassium: 4.1 mmol/L (ref 3.5–5.1)
Sodium: 137 mmol/L (ref 135–145)
Total Bilirubin: 0.7 mg/dL (ref 0.0–1.2)
Total Protein: 7.2 g/dL (ref 6.5–8.1)

## 2023-08-08 LAB — MAGNESIUM: Magnesium: 1.6 mg/dL — ABNORMAL LOW (ref 1.7–2.4)

## 2023-08-08 LAB — CBG MONITORING, ED: Glucose-Capillary: 143 mg/dL — ABNORMAL HIGH (ref 70–99)

## 2023-08-08 MED ORDER — IOHEXOL 350 MG/ML SOLN
75.0000 mL | Freq: Once | INTRAVENOUS | Status: AC | PRN
  Administered 2023-08-08: 75 mL via INTRAVENOUS

## 2023-08-08 MED ORDER — INSULIN ASPART 100 UNIT/ML IJ SOLN
0.0000 [IU] | Freq: Three times a day (TID) | INTRAMUSCULAR | Status: DC
  Administered 2023-08-09: 2 [IU] via SUBCUTANEOUS
  Administered 2023-08-09: 1 [IU] via SUBCUTANEOUS
  Administered 2023-08-10: 5 [IU] via SUBCUTANEOUS
  Administered 2023-08-10 (×2): 2 [IU] via SUBCUTANEOUS
  Administered 2023-08-11 (×2): 3 [IU] via SUBCUTANEOUS
  Administered 2023-08-11: 2 [IU] via SUBCUTANEOUS
  Administered 2023-08-12: 1 [IU] via SUBCUTANEOUS
  Administered 2023-08-12: 2 [IU] via SUBCUTANEOUS
  Administered 2023-08-12: 1 [IU] via SUBCUTANEOUS
  Administered 2023-08-13: 2 [IU] via SUBCUTANEOUS
  Administered 2023-08-13: 3 [IU] via SUBCUTANEOUS

## 2023-08-08 MED ORDER — LORAZEPAM 2 MG/ML IJ SOLN: 1.0000 mg | Freq: Once | INTRAMUSCULAR | Status: DC

## 2023-08-08 MED ORDER — STROKE: EARLY STAGES OF RECOVERY BOOK
Freq: Once | Status: AC
  Filled 2023-08-08: qty 1

## 2023-08-08 MED ORDER — CLOPIDOGREL BISULFATE 75 MG PO TABS: 75.0000 mg | ORAL_TABLET | Freq: Every day | ORAL | Status: DC

## 2023-08-08 NOTE — ED Notes (Signed)
 Daughter Bartlett Boroughs [phone] would like an update asap

## 2023-08-08 NOTE — Code Documentation (Signed)
 Stroke Response Nurse Documentation Code Documentation  Andrea Wade is a 53 y.o. female arriving to [facility]  via [facility] on 08-08-23 with past medical hx of MS, DM, RA. On No antithrombotic. Code stroke was activated by ED.   Patient from home where she was LKW is unclear and now complaining of not feeling well.  She had a syncopal episode while walking to her bedroom and fell.   Stroke team at the bedside on patient arrival. Labs drawn and patient cleared for CT by Dr. Carylon Claude. Patient to CT with team. NIHSS 10, see documentation for details and code stroke times. Patient with bilateral arm weakness and bilateral leg weakness on exam. The following imaging was completed:  CT Head. Patient is not a candidate for IV Thrombolytic due LKW unclear. Patient is not a candidate for IR due to no LVO suspected.   Care Plan: VS and NIHSS q 2 hours x 12 hours.   Bedside handoff with ED RN Ester.    Waldemar Guillaume  Stroke Response RN

## 2023-08-08 NOTE — ED Triage Notes (Signed)
 BIB [facility] from home. Patient had a syncope episode and fell with positive LOC, patients husband stated last well known was 1341. EMS arrived and assessed patient to have left sided facial droop with right arm weakness and left sided weakness. Patient has a history of previous stroke, MS. Patient states having back and bilateral leg pains 10/10 after falling today.

## 2023-08-08 NOTE — ED Notes (Signed)
 Went to CT

## 2023-08-08 NOTE — ED Provider Notes (Signed)
 [place] EMERGENCY DEPARTMENT AT [facility] Provider Note   CSN: [id number] Arrival date & time: 08/08/23  1453     History  Chief Complaint  Patient presents with   Code Stroke    Andrea Wade is a 53 y.o. female.  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  Patient's husband states that he was outside talking to his brother-in-law for about an hour while the patient was in the bathroom.  When he went inside he found her on the ground next to the bed.  The patient states that he remembers walking to the bedroom to use the bathroom and that is when she had this episode.  She believes that she had syncope, however it is possible that she had seizure.  But when she woke up she felt different than her baseline deficits from MS. Patient states that at baseline she has a right-sided facial droop, intermittent speech difficulties, 4/4 extremity weakness and sometimes visual changes.  But currently she is experiencing decreased sensation to all of her extremities as well as more pronounced slurred speech.  However the boyfriend states that her symptoms do wax and wane in terms of severity.  Originally seen as a code stroke with the stroke neurology team.  Home Medications Prior to Admission medications   Medication Sig Start Date End Date Taking? Authorizing Provider  baclofen  (LIORESAL ) 20 MG tablet Take 20 mg by mouth 4 (four) times daily. 03/15/20   [provider]  buPROPion  (WELLBUTRIN  XL) 150 MG 24 hr tablet Take 1 tablet by mouth every morning. 07/31/20   [provider]  dicyclomine  (BENTYL ) 10 MG capsule Take by mouth. 09/01/21   [provider]  divalproex (DEPAKOTE ER) 500 MG 24 hr tablet Take 500 mg by mouth at bedtime. 10/06/21   [provider]  DULoxetine  (CYMBALTA ) 60 MG capsule Take 60 mg by mouth daily.    [provider]  EPINEPHrine  0.3 mg/0.3 mL IJ SOAJ injection Inject 0.3 mg into the muscle as needed for anaphylaxis. 11/18/16   [provider]  esomeprazole (NEXIUM) 40 MG capsule Take 40 mg by mouth daily. 10/14/21   [provider]  fentaNYL  (DURAGESIC ) 25 MCG/HR 1 patch every 3 (three) days. 01/13/22   [provider]  folic acid  (FOLVITE ) 1 MG tablet Take 1 mg by mouth daily. 09/26/21   [provider]  Glucagon (BAQSIMI ONE PACK) 3 MG/DOSE POWD Place into the nose. 06/19/21   [provider]  HUMALOG KWIKPEN 100 UNIT/ML KwikPen Inject into the skin. 11/21/20   [provider]  HYDROmorphone  (DILAUDID ) 2 MG tablet Take 2 mg by mouth 4 (four) times daily. 11/11/21   [provider]  lisinopril  (PRINIVIL ,ZESTRIL ) 5 MG tablet Take 1 tablet (5 mg total) by mouth daily. 10/08/15   Rai, Hurman Maiden, MD  meloxicam  (MOBIC ) 7.5 MG tablet Take 7.5 mg by mouth 2 (two) times daily.    [provider]  nortriptyline  (PAMELOR ) 75 MG capsule Take 75 mg by mouth at bedtime. 07/30/20   [provider]  OZEMPIC, 0.25 OR 0.5 MG/DOSE, 2 MG/3ML SOPN Inject into the skin. 05/28/21   [provider]  phenol (CHLORASEPTIC) 1.4 % LIQD Use as directed 1 spray in the mouth or throat as needed for throat irritation / pain. 09/04/20   Kraig Peru, MD  promethazine  (PHENERGAN ) 12.5 MG tablet Take  12.5 mg by mouth 2 (two) times daily.    [provider]  Rimegepant Sulfate 75 MG TBDP Take 1 tablet every other day for headache prevention Patient not taking: Reported on 11/22/2020 10/14/20   [provider]  rosuvastatin (CRESTOR) 5 MG tablet Take 1 tablet by mouth daily. 07/30/21   [provider]  SYNJARDY XR 11-998 MG TB24 Take 1 tablet by mouth in the morning and at bedtime. 07/30/20   [provider]  topiramate  (TOPAMAX ) 200 MG tablet Take 200 mg by mouth 2 (two) times daily. 04/29/20   [provider]  TRESIBA FLEXTOUCH 100 UNIT/ML FlexTouch Pen Inject 50 Units into the skin at bedtime. 08/13/20   [provider]      Allergies    Codeine, Peanut-containing drug products, Aspirin , Corn-containing products, Pecan extract, Penicillins, Shellfish-derived products, and Tape    Review of Systems   Review of Systems  Unable to perform ROS: Acuity of condition    Physical Exam Updated Vital Signs BP 108/80   Pulse 85   Resp 14   Ht 5\' 9"  (1.753 m)   Wt 92.5 kg   SpO2 95%   BMI 30.13 kg/m  Physical Exam Vitals and nursing note reviewed.  Constitutional:      Appearance: Normal appearance.  HENT:     Head: Normocephalic.     Mouth/Throat:     Mouth: Mucous membranes are moist.  Eyes:     Extraocular Movements: Extraocular movements intact.     Pupils: Pupils are equal, round, and reactive to light.  Cardiovascular:     Rate and Rhythm: Normal rate.  Pulmonary:     Effort: Pulmonary effort is normal. No respiratory distress.  Abdominal:     Palpations: Abdomen is soft.     Tenderness: There is no abdominal tenderness.  Skin:    General: Skin is warm.  Neurological:     Mental Status: She is alert and oriented to person, place, and time.     Comments: Right-sided facial droop, weakness in all 4 extremities with no strength against gravity, decreased sensation to all 4 extremities, intermittently slurred speech  Psychiatric:        Mood and Affect: Mood normal.     ED Results / Procedures / Treatments   Labs (all labs ordered are listed, but only abnormal results are displayed) Labs Reviewed  CBC WITH DIFFERENTIAL/PLATELET - Abnormal; Notable for the following components:      Result Value   Platelets 130 (*)    All other components within normal limits  COMPREHENSIVE METABOLIC PANEL WITH GFR - Abnormal; Notable for the following components:   CO2 19 (*)    Glucose, Bld 134 (*)    Creatinine, Ser 1.13 (*)    Albumin 3.1 (*)    GFR, Estimated 58 (*)     All other components within normal limits  MAGNESIUM - Abnormal; Notable for the following components:   Magnesium 1.6 (*)    All other components within normal limits  CBG MONITORING, ED - Abnormal; Notable for the following components:   Glucose-Capillary 143 (*)    All other components within normal limits  URINALYSIS, ROUTINE W REFLEX MICROSCOPIC  TOPIRAMATE  LEVEL  LIPID PANEL  HEMOGLOBIN A1C  RAPID URINE DRUG SCREEN, HOSP PERFORMED    EKG None  Radiology CT HEAD CODE STROKE WO CONTRAST Result Date: 08/08/2023 CLINICAL DATA:  Code stroke.  Neuro deficit, concern for stroke. EXAM: CT HEAD WITHOUT CONTRAST TECHNIQUE: Contiguous  axial images were obtained from the base of the skull through the vertex without intravenous contrast. RADIATION DOSE REDUCTION: This exam was performed according to the departmental dose-optimization program which includes automated exposure control, adjustment of the mA and/or kV according to patient size and/or use of iterative reconstruction technique. COMPARISON:  MRI head 11/30/2022. FINDINGS: Brain: No acute intracranial hemorrhage. Redemonstrated remote infarct in the left temporal occipital lobes. Additional small remote infarct involving the superior right occipital lobe. There is edema at the skull base which slightly limits the right middle cranial fossa. Within this limitation there is concern for possible loss of gray-white matter differentiation. Nonspecific hypoattenuation in the periventricular and subcortical white matter favored to reflect chronic microvascular ischemic changes. No significant edema, mass effect, or midline shift. The basilar cisterns are patent. Ventricles: The ventricles are normal. Vascular: No hyperdense vessel or unexpected calcification. Skull: No acute or aggressive finding. Orbits: Orbits are symmetric. Sinuses: The visualized paranasal sinuses are clear. Other: Mastoid air cells are clear. ASPECTS [facility] Stroke Program Early  CT Score) - Ganglionic level infarction (caudate, lentiform nuclei, internal capsule, insula, M1-M3 cortex): 6 - Supraganglionic infarction (M4-M6 cortex): 3 Total score (0-10 with 10 being normal): 9 IMPRESSION: 1. Right middle cranial fossa is slightly limited by artifact. Within these limitations there is concern for loss of gray-white differentiation within the anterior left temporal lobe concerning for acute infarct. Recommend MRI for further evaluation. 2. 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 3. Chronic microvascular ischemic changes. 4. ASPECTS is 9 These results were communicated to Dr. Doretta Gant At 3:19 pm on 08/08/2023 by text page via the [facility] messaging system. Electronically Signed   By: Denny Flack M.D.   On: 08/08/2023 15:19    Procedures .Critical Care  Performed by: Flonnie Humphrey, DO Authorized by: Flonnie Humphrey, DO   Critical care provider statement:    Critical care time (minutes):  30   Critical care time was exclusive of:  Separately billable procedures and treating other patients   Critical care was necessary to treat or prevent imminent or life-threatening deterioration of the following conditions:  CNS failure or compromise   Critical care was time spent personally by me on the following activities:  Development of treatment plan with patient or surrogate, discussions with consultants, evaluation of patient's response to treatment, examination of patient, ordering and review of laboratory studies, ordering and review of radiographic studies, ordering and performing treatments and interventions, pulse oximetry, re-evaluation of patient's condition and review of old charts   I assumed direction of critical care for this patient from another provider in my specialty: no     Care discussed with: admitting provider       Medications Ordered in ED Medications   stroke: early stages of recovery book (has no administration in time  range)    ED Course/ Medical Decision Making/ A&P                                 Medical Decision Making Amount and/or Complexity of Data Reviewed Labs: ordered.   53 year old female presents emergency department after loss of conscious episode, syncope versus seizure with new neurofindings.  Of note patient has MS and is not currently being treated and has been having progressive symptoms secondary to her disease.  Unclear what her baseline deficits are versus new acute neurologic deficit.  Originally seen as a code  stroke with the stroke neurology team.  However with no clear baseline deficits does not seem to be a candidate for acute treatment.  Head CT shows concern for new stroke findings.  Stroke neurology team aware.  Recommend medical admission for further stroke workup.  Resulted blood work at this time shows no acute metabolic change.  Patient and boyfriend will be updated we will plan for medical admission.  Patients evaluation and results requires admission for further treatment and care.  Spoke with hospitalist, reviewed patient's ED course and they accept admission.  Patient agrees with admission plan, offers no new complaints and is stable/unchanged at time of admit.        Final Clinical Impression(s) / ED Diagnoses Final diagnoses:  None    Rx / DC Orders ED Discharge Orders     None         Flonnie Humphrey, DO 08/08/23 1735

## 2023-08-08 NOTE — Consult Note (Signed)
 NEUROLOGY CONSULT NOTE   Date of service: August 08, 2023 Patient Name: Andrea [facility] MRN:  [id number] DOB:  04-Oct-1970 Chief Complaint: "CODE STROKE" Requesting Provider: Flonnie Humphrey, DO  History of Present Illness  Andrea [facility] is a 53 y.o. female with hx of MS, seizure-like activity (Topamax  200mg  BID and Depakote 1000 mg daily PTA), headaches, chronic migraines, chronic pain who was BIB EMS as an activated CODE STROKE due to left-sided weakness (noted on en code called into ED) after a questionable syncopal event. Patient has MS and uses a roll-a-tor or wheelchair to ambulate at home. Per EMS, husband was unable to tell them a precise LKW, states that the last 3 years have been bad with her and that she has good days and bad days.  On arrival exam, patient is alert and oriented, no vision or sensory deficits, generalized weakness with increased tone/decrease knee extension BLE. Patient states that this weakness is her baseline due to her MS. [facility] shows concern for acute infarct.  She is not a TNK candidate due to no acute focal symptoms and unclear LKW. She is not an EVT candidate due to no LVO suspected.   LKW: unclear Modified rankin score: 4-Needs assistance to walk and tend to bodily needs IV Thrombolysis: No, outside of window EVT: No, no LVO suspected   NIHSS components Score: Comment  1a Level of Conscious 0[x]  1[]  2[]  3[]      1b LOC Questions 0[x]  1[]  2[]       1c LOC Commands 0[x]  1[]  2[]       2 Best Gaze 0[x]  1[]  2[]       3 Visual 0[x]  1[]  2[]  3[]      4 Facial Palsy 0[x]  1[]  2[]  3[]      5a Motor Arm - left 0[]  1[]  2[x]  3[]  4[]  UN[]   baseline  5b Motor Arm - Right 0[]  1[]  2[x]  3[]  4[]  UN[]   baseline  6a Motor Leg - Left 0[]  1[]  2[]  3[x]  4[]  UN[]   baseline  6b Motor Leg - Right 0[]  1[]  2[]  3[x]  4[]  UN[]   baseline  7 Limb Ataxia 0[]  1[]  2[]  UN[x]      8 Sensory 0[x]  1[]  2[]  UN[]      9 Best Language 0[x]  1[]  2[]  3[]      10 Dysarthria 0[x]  1[]  2[]  UN[]      11  Extinct. and Inattention 0[x]  1[]  2[]       TOTAL:   10      ROS  Comprehensive ROS performed and pertinent positives documented in HPI   Past History   Past Medical History:  Diagnosis Date   Anemia    Anxiety    Arthritis    "joints" (04/17/2013)   Chronic back pain    "all over" (04/17/2013)   Crohn disease (HCC)    Daily headache    Depression    Diabetes type 2, uncontrolled    Epileptic (HCC)    "had 1-2/night; started RX; last one was 01/2014"   GERD (gastroesophageal reflux disease)    Migraine    "q day" (04/17/2013)   MS (multiple sclerosis) (HCC)    Pneumonia    "about 10 times in my lifetime" (04/17/2013)    Past Surgical History:  Procedure Laterality Date   CARDIAC CATHETERIZATION N/A 10/08/2015   Procedure: Left Heart Cath and Coronary Angiography;  Surgeon: Peter M [place], MD;  Location: [facility];  Service: Cardiovascular;  Laterality: N/A;   CHOLECYSTECTOMY  1994   TUBAL LIGATION  1993  Family History: Family History  Problem Relation Age of Onset   Diabetes Mother    Heart disease Mother    Stroke Mother    Heart failure Mother    Heart disease Father    Stroke Father    Heart failure Father    Heart attack Brother    Healthy Brother     Social History  reports that she has quit smoking. Her smoking use included cigarettes. She has a 10.5 pack-year smoking history. She has never used smokeless tobacco. She reports that she does not drink alcohol and does not use drugs.  Allergies  Allergen Reactions   Codeine Hives, Itching and Palpitations   Peanut-Containing Drug Products Anaphylaxis and Itching   Aspirin  Itching    High doses only-baby is ok   Corn-Containing Products Rash   Pecan Extract Itching   Penicillins Hives, Itching and Nausea And Vomiting    Allergy from childhood Has patient had a PCN reaction causing immediate rash, facial/tongue/throat swelling, SOB or lightheadedness with hypotension: Yes Has patient had a PCN  reaction causing severe rash involving mucus membranes or skin necrosis: Unk Has patient had a PCN reaction that required hospitalization: Unk Has patient had a PCN reaction occurring within the last 10 years: No If all of the above answers are "NO", then may proceed with Cephalosporin use.     Shellfish-Derived Products Rash and Swelling   Tape Other (See Comments)    Slight irritation Other reaction(s): Other (See Comments) Slight irritation    Medications  No current facility-administered medications for this encounter.  Current Outpatient Medications:    baclofen  (LIORESAL ) 20 MG tablet, Take 20 mg by mouth 4 (four) times daily., Disp: , Rfl:    buPROPion  (WELLBUTRIN  XL) 150 MG 24 hr tablet, Take 1 tablet by mouth every morning., Disp: , Rfl:    dicyclomine  (BENTYL ) 10 MG capsule, Take by mouth., Disp: , Rfl:    divalproex (DEPAKOTE ER) 500 MG 24 hr tablet, Take 500 mg by mouth at bedtime., Disp: , Rfl:    DULoxetine  (CYMBALTA ) 60 MG capsule, Take 60 mg by mouth daily., Disp: , Rfl:    EPINEPHrine  0.3 mg/0.3 mL IJ SOAJ injection, Inject 0.3 mg into the muscle as needed for anaphylaxis., Disp: , Rfl:    esomeprazole (NEXIUM) 40 MG capsule, Take 40 mg by mouth daily., Disp: , Rfl:    fentaNYL  (DURAGESIC ) 25 MCG/HR, 1 patch every 3 (three) days., Disp: , Rfl:    folic acid  (FOLVITE ) 1 MG tablet, Take 1 mg by mouth daily., Disp: , Rfl:    Glucagon (BAQSIMI ONE PACK) 3 MG/DOSE POWD, Place into the nose., Disp: , Rfl:    HUMALOG KWIKPEN 100 UNIT/ML KwikPen, Inject into the skin., Disp: , Rfl:    HYDROmorphone  (DILAUDID ) 2 MG tablet, Take 2 mg by mouth 4 (four) times daily., Disp: , Rfl:    lisinopril  (PRINIVIL ,ZESTRIL ) 5 MG tablet, Take 1 tablet (5 mg total) by mouth daily., Disp: 30 tablet, Rfl: 3   meloxicam  (MOBIC ) 7.5 MG tablet, Take 7.5 mg by mouth 2 (two) times daily., Disp: , Rfl:    nortriptyline  (PAMELOR ) 75 MG capsule, Take 75 mg by mouth at bedtime., Disp: , Rfl:    OZEMPIC,  0.25 OR 0.5 MG/DOSE, 2 MG/3ML SOPN, Inject into the skin., Disp: , Rfl:    phenol (CHLORASEPTIC) 1.4 % LIQD, Use as directed 1 spray in the mouth or throat as needed for throat irritation / pain., Disp: 177 mL, Rfl:  0   promethazine  (PHENERGAN ) 12.5 MG tablet, Take 12.5 mg by mouth 2 (two) times daily., Disp: , Rfl:    Rimegepant Sulfate 75 MG TBDP, Take 1 tablet every other day for headache prevention (Patient not taking: Reported on 11/22/2020), Disp: , Rfl:    rosuvastatin (CRESTOR) 5 MG tablet, Take 1 tablet by mouth daily., Disp: , Rfl:    SYNJARDY XR 11-998 MG TB24, Take 1 tablet by mouth in the morning and at bedtime., Disp: , Rfl:    topiramate  (TOPAMAX ) 200 MG tablet, Take 200 mg by mouth 2 (two) times daily., Disp: , Rfl:    TRESIBA FLEXTOUCH 100 UNIT/ML FlexTouch Pen, Inject 50 Units into the skin at bedtime., Disp: , Rfl:   Vitals   Vitals:   08/08/23 1459 08/08/23 1515 08/08/23 1518  SpO2:  97%   Weight: 92.2 kg  92.5 kg  Height:   5\' 9"  (1.753 m)    Body mass index is 30.13 kg/m.   Physical Exam   Constitutional: Appears chronically ill Cardiovascular: Normal rate and regular rhythm.  Respiratory: Effort normal, non-labored breathing.   Neurologic Examination   Neuro: Mental Status: Patient is awake, alert, oriented to person, place, month, year, and situation. Patient is able to give a clear and coherent history. No signs of aphasia or neglect Cranial Nerves: II: Visual Fields are full. Pupils are equal, round, and reactive to light.   III,IV, VI: EOMI without ptosis or diploplia.  V: Facial sensation is symmetric to temperature VII: Facial movement is symmetric.  VIII: hearing is intact to voice X: No dysarthria, hypophonia XI: Shoulder shrug is symmetric. XII: tongue is midline without atrophy or fasciculations.  Motor: Tone is increased. Bulk is reduced Generalized weakness BUE: 3/5 BLE: 3/5, decreased knee extension Sensory: Sensation is symmetric  to light touch in the arms and legs. Cerebellar: Unable to perform due to weakness   Labs/Imaging/Neurodiagnostic studies   CBC: No results for input(s): "WBC", "NEUTROABS", "HGB", "HCT", "MCV", "PLT" in the last 168 hours. Basic Metabolic Panel:  Lab Results  Component Value Date   NA 140 02/03/2022   K 4.2 02/03/2022   CO2 19 (L) 02/03/2022   GLUCOSE 136 (H) 02/03/2022   BUN 26 (H) 02/03/2022   CREATININE 0.92 02/03/2022   CALCIUM  9.2 02/03/2022   GFRNONAA >60 02/03/2022   GFRAA >60 04/19/2016   Lipid Panel:  Lab Results  Component Value Date   LDLCALC 94 10/08/2015   HgbA1c:  Lab Results  Component Value Date   HGBA1C 10.5 (H) 09/02/2020   Urine Drug Screen:     Component Value Date/Time   LABOPIA POSITIVE (A) 04/17/2013 0946   COCAINSCRNUR NONE DETECTED 04/17/2013 0946   LABBENZ NONE DETECTED 04/17/2013 0946   AMPHETMU NONE DETECTED 04/17/2013 0946   THCU NONE DETECTED 04/17/2013 0946   LABBARB NONE DETECTED 04/17/2013 0946    Alcohol Level No results found for: "ETH" INR  Lab Results  Component Value Date   INR 1.1 09/01/2020   APTT  Lab Results  Component Value Date   APTT 29 09/01/2020   AED levels: No results found for: "PHENYTOIN", "ZONISAMIDE", "LAMOTRIGINE", "LEVETIRACETA"  CT Head without contrast(Personally reviewed): Loss of gray-white differentiation anterior left temporal lobe, possibly concerning for acute infarct Remote infarcts left temporal occipital lobe, right occipital lobe   MRI Brain(Personally reviewed): pending   ASSESSMENT   Camillia Marcy is a 53 y.o. female with hx of MS, seizure-like activity (Topamax  200mg  BID and Depakote 1000  mg daily PTA), headaches, chronic migraines, chronic pain who was BIB EMS as an activated CODE STROKE due to left-sided weakness (noted on en code called into ED).   On arrival exam, patient is alert and oriented, no vision or sensory deficits, generalized weakness with increased  tone/decrease knee extension BLE. Patient states that this weakness is her baseline due to her MS. [facility] shows concern for acute infarct.  She is not a TNK candidate due to no acute focal symptoms and unclear LKW. She is not an EVT candidate due to no LVO suspected.   Syncopal events have been noted on patient's chart for multiple years, negative workups. Seizure-like activity has been noted in patient's chart.  She sees Amy Curnes PA with [facility] for neurology outpatient.  She is on Topamax  200 mg twice daily and Depakote 1000 mg daily.  She was previously admitted to EMU at [facility] in 2016, no events captured.  Episodes were thought to be nonepileptic in nature.  48-hour EEG from 2022 was normal.   There are no new focal deficits or seizure-like activity seen. Patient has had multiple examinations of her syncopal spells and nonepiletic events and is under the care of outpatient neurologist for her MS, which patient states her symptoms seem to be at her baseline.No indication for EEG at this time. However, CT read shows concern for acute infarct and we should get an MRI to fully evaluate for acute stroke.   RECOMMENDATIONS   - Continue home Depakote and Topomax  - Frequent Neuro checks per stroke unit protocol - MRI Brain stroke protocol - Vascular imaging - CTA - TTE w/bubble study  - Lipid panel - Statin - will be started if LDL>70 or otherwise medically indicated - A1C - Antithrombotic - Aspirin  and Plavix once able to take PO. If she does not pass swallow screen, PR aspirin  only. Patient was not on antithrombotic PTA - DVT ppx - ok for DVT ppx - SBP goal - <220, permissive hypertension for 24hours then gradually normalize PRN labetalol if HR>60 and PRN Hydralazine if HR<60 - Telemetry monitoring for arrhythmia - 72h - Swallow screen - will be performed prior to PO intake - Stroke education - will be given - PT/OT/SLP - Dispo: stroke  workup  ______________________________________________________________________    Signed, Audrene Lease, NP [facility]    Attending Neurohospitalist Addendum Patient seen and examined with APP/Resident. Agree with the history and physical as documented above. Agree with the plan as documented, which I helped formulate. I have edited the note above to reflect my full findings and recommendations. I have independently reviewed the chart, obtained history, review of systems and examined the patient.I have personally reviewed pertinent head/neck/spine imaging (CT/MRI). Please feel free to call with any questions.  -- Greg Leaks, MD [facility] [phone]  If 7pm- 7am, please page neurology on call as listed in AMION.

## 2023-08-08 NOTE — H&P (Addendum)
 History and Physical    Andrea Wade NGE:[id number] DOB: December 23, 1970 DOA: 08/08/2023  Referring MD/NP/PA: EDP PCP:  Patient coming from: Home  Chief Complaint: Passed out  HPI: Andrea Wade is a chronically ill 52/F with history of multiple sclerosis, seizure disorder on Depakote and Topamax , headaches, chronic migraines, chronic pain, narcotic dependence, type 2 diabetes mellitus, arthritis, chronic back pain was brought to the ED as a code stroke for left-sided weakness.  Patient reports that she usually uses rollator or wheelchair to ambulate, she tried to walk to the bathroom holding the sides and next thing she remembers is being down on the floor, recalling hitting her head, back, knee etc. -Subsequent to this reports having increased pain and weakness in both legs especially the right, also reports pain in her mid back, EMS was called and brought to the ED -Vital signs were stable, CBG 140, BMP largely unremarkable except low magnesium and low platelets, CT head limited by motion artifact, cannot rule out abnormality in anterior left temporal lobe concerning for acute infarct.  Review of Systems: As per HPI otherwise 14 point review of systems negative.   Past Medical History:  Diagnosis Date   Anemia    Anxiety    Arthritis    "joints" (04/17/2013)   Chronic back pain    "all over" (04/17/2013)   Crohn disease (HCC)    Daily headache    Depression    Diabetes type 2, uncontrolled    Epileptic (HCC)    "had 1-2/night; started RX; last one was 01/2014"   GERD (gastroesophageal reflux disease)    Migraine    "q day" (04/17/2013)   MS (multiple sclerosis) (HCC)    Pneumonia    "about 10 times in my lifetime" (04/17/2013)    Past Surgical History:  Procedure Laterality Date   CARDIAC CATHETERIZATION N/A 10/08/2015   Procedure: Left Heart Cath and Coronary Angiography;  Surgeon: Peter M [place], MD;  Location: [facility] INVASIVE CV LAB;  Service: Cardiovascular;  Laterality:  N/A;   CHOLECYSTECTOMY  1994   TUBAL LIGATION  1993     reports that she has quit smoking. Her smoking use included cigarettes. She has a 10.5 pack-year smoking history. She has never used smokeless tobacco. She reports that she does not drink alcohol and does not use drugs.  Allergies  Allergen Reactions   Codeine Hives, Itching and Palpitations   Peanut-Containing Drug Products Anaphylaxis and Itching   Aspirin  Itching    High doses only-baby is ok   Corn-Containing Products Rash   Pecan Extract Itching   Penicillins Hives, Itching and Nausea And Vomiting    Allergy from childhood Has patient had a PCN reaction causing immediate rash, facial/tongue/throat swelling, SOB or lightheadedness with hypotension: Yes Has patient had a PCN reaction causing severe rash involving mucus membranes or skin necrosis: Unk Has patient had a PCN reaction that required hospitalization: Unk Has patient had a PCN reaction occurring within the last 10 years: No If all of the above answers are "NO", then may proceed with Cephalosporin use.     Shellfish-Derived Products Rash and Swelling   Tape Other (See Comments)    Slight irritation Other reaction(s): Other (See Comments) Slight irritation    Family History  Problem Relation Age of Onset   Diabetes Mother    Heart disease Mother    Stroke Mother    Heart failure Mother    Heart disease Father    Stroke Father    Heart  failure Father    Heart attack Brother    Healthy Brother      Prior to Admission medications   Medication Sig Start Date End Date Taking? Authorizing Provider  baclofen  (LIORESAL ) 20 MG tablet Take 20 mg by mouth 4 (four) times daily. 03/15/20   [provider]  buPROPion  (WELLBUTRIN  XL) 150 MG 24 hr tablet Take 1 tablet by mouth every morning. 07/31/20   [provider]  dicyclomine  (BENTYL ) 10 MG capsule Take by mouth. 09/01/21   [provider]  divalproex (DEPAKOTE ER) 500 MG 24 hr tablet Take  500 mg by mouth at bedtime. 10/06/21   [provider]  DULoxetine  (CYMBALTA ) 60 MG capsule Take 60 mg by mouth daily.    [provider]  EPINEPHrine  0.3 mg/0.3 mL IJ SOAJ injection Inject 0.3 mg into the muscle as needed for anaphylaxis. 11/18/16   [provider]  esomeprazole (NEXIUM) 40 MG capsule Take 40 mg by mouth daily. 10/14/21   [provider]  fentaNYL  (DURAGESIC ) 25 MCG/HR 1 patch every 3 (three) days. 01/13/22   [provider]  folic acid  (FOLVITE ) 1 MG tablet Take 1 mg by mouth daily. 09/26/21   [provider]  Glucagon (BAQSIMI ONE PACK) 3 MG/DOSE POWD Place into the nose. 06/19/21   [provider]  HUMALOG KWIKPEN 100 UNIT/ML KwikPen Inject into the skin. 11/21/20   [provider]  HYDROmorphone  (DILAUDID ) 2 MG tablet Take 2 mg by mouth 4 (four) times daily. 11/11/21   [provider]  lisinopril  (PRINIVIL ,ZESTRIL ) 5 MG tablet Take 1 tablet (5 mg total) by mouth daily. 10/08/15   Rai, Ripudeep K, MD  meloxicam  (MOBIC ) 7.5 MG tablet Take 7.5 mg by mouth 2 (two) times daily.    [provider]  nortriptyline  (PAMELOR ) 75 MG capsule Take 75 mg by mouth at bedtime. 07/30/20   [provider]  OZEMPIC, 0.25 OR 0.5 MG/DOSE, 2 MG/3ML SOPN Inject into the skin. 05/28/21   [provider]  phenol (CHLORASEPTIC) 1.4 % LIQD Use as directed 1 spray in the mouth or throat as needed for throat irritation / pain. 09/04/20   Kraig Peru, MD  promethazine  (PHENERGAN ) 12.5 MG tablet Take 12.5 mg by mouth 2 (two) times daily.    [provider]  Rimegepant Sulfate 75 MG TBDP Take 1 tablet every other day for headache prevention Patient not taking: Reported on 11/22/2020 10/14/20   [provider]  rosuvastatin (CRESTOR) 5 MG tablet Take 1 tablet by mouth daily. 07/30/21   [provider]  SYNJARDY XR 11-998 MG TB24 Take 1 tablet by mouth in the morning and at bedtime.  07/30/20   [provider]  topiramate  (TOPAMAX ) 200 MG tablet Take 200 mg by mouth 2 (two) times daily. 04/29/20   [provider]  TRESIBA FLEXTOUCH 100 UNIT/ML FlexTouch Pen Inject 50 Units into the skin at bedtime. 08/13/20   [provider]    Physical Exam: Vitals:   08/08/23 1645 08/08/23 1700 08/08/23 1715 08/08/23 1730  BP: 108/80 90/66 99/69  113/75  Pulse: 85 84 83 84  Resp: 14 15 20 15   SpO2: 95% 97% 96% 98%  Weight:      Height:          Constitutional: Ackley ill-appearing, AO x 2, flat affect HEENT:no JVD Respiratory: clear to auscultation bilaterally Cardiovascular: S1S2/RRR Abdomen: soft, non tender, Bowel sounds positive.  Musculoskeletal: Tenderness in her mid thoracic spine, painful range of  motion of right knee Ext: no edema Skin: no rashes Neurologic: Frequent bilateral lower extremity weakness, some part of it is limited by pain, and underlying deficits from multiple sclerosis Psychiatric: Flat affect, oriented  Labs on Admission: I have personally reviewed following labs and imaging studies  CBC: Recent Labs  Lab 08/08/23 1643  WBC 7.1  NEUTROABS 2.9  HGB 14.1  HCT 45.2  MCV 90.6  PLT 130*   Basic Metabolic Panel: Recent Labs  Lab 08/08/23 1643  NA 137  K 4.1  CL 106  CO2 19*  GLUCOSE 134*  BUN 17  CREATININE 1.13*  CALCIUM  9.1  MG 1.6*   GFR: Estimated Creatinine Clearance: 69.7 mL/min (A) (by C-G formula based on SCr of 1.13 mg/dL (H)). Liver Function Tests: Recent Labs  Lab 08/08/23 1643  AST 27  ALT 16  ALKPHOS 56  BILITOT 0.7  PROT 7.2  ALBUMIN 3.1*   No results for input(s): "LIPASE", "AMYLASE" in the last 168 hours. No results for input(s): "AMMONIA" in the last 168 hours. Coagulation Profile: No results for input(s): "INR", "PROTIME" in the last 168 hours. Cardiac Enzymes: No results for input(s): "CKTOTAL", "CKMB", "CKMBINDEX", "TROPONINI" in the last 168 hours. BNP (last 3 results) No  results for input(s): "PROBNP" in the last 8760 hours. HbA1C: No results for input(s): "HGBA1C" in the last 72 hours. CBG: Recent Labs  Lab 08/08/23 1522  GLUCAP 143*   Lipid Profile: No results for input(s): "CHOL", "HDL", "LDLCALC", "TRIG", "CHOLHDL", "LDLDIRECT" in the last 72 hours. Thyroid  Function Tests: No results for input(s): "TSH", "T4TOTAL", "FREET4", "T3FREE", "THYROIDAB" in the last 72 hours. Anemia Panel: No results for input(s): "VITAMINB12", "FOLATE", "FERRITIN", "TIBC", "IRON", "RETICCTPCT" in the last 72 hours. Urine analysis:    Component Value Date/Time   COLORURINE STRAW (A) 09/02/2020 0552   APPEARANCEUR CLEAR 09/02/2020 0552   LABSPEC 1.011 09/02/2020 0552   PHURINE 7.0 09/02/2020 0552   GLUCOSEU >=500 (A) 09/02/2020 0552   HGBUR NEGATIVE 09/02/2020 0552   BILIRUBINUR NEGATIVE 09/02/2020 0552   KETONESUR NEGATIVE 09/02/2020 0552   PROTEINUR NEGATIVE 09/02/2020 0552   UROBILINOGEN 0.2 07/22/2013 1703   NITRITE NEGATIVE 09/02/2020 0552   LEUKOCYTESUR NEGATIVE 09/02/2020 0552   Sepsis Labs: @LABRCNTIP (procalcitonin:4,lacticidven:4) )No results found for this or any previous visit (from the past 240 hours).   Radiological Exams on Admission: CT HEAD CODE STROKE WO CONTRAST Result Date: 08/08/2023 CLINICAL DATA:  Code stroke.  Neuro deficit, concern for stroke. EXAM: CT HEAD WITHOUT CONTRAST TECHNIQUE: Contiguous axial images were obtained from the base of the skull through the vertex without intravenous contrast. RADIATION DOSE REDUCTION: This exam was performed according to the departmental dose-optimization program which includes automated exposure control, adjustment of the mA and/or kV according to patient size and/or use of iterative reconstruction technique. COMPARISON:  MRI head 11/30/2022. FINDINGS: Brain: No acute intracranial hemorrhage. Redemonstrated remote infarct in the left temporal occipital lobes. Additional small remote infarct involving the  superior right occipital lobe. There is edema at the skull base which slightly limits the right middle cranial fossa. Within this limitation there is concern for possible loss of gray-white matter differentiation. Nonspecific hypoattenuation in the periventricular and subcortical white matter favored to reflect chronic microvascular ischemic changes. No significant edema, mass effect, or midline shift. The basilar cisterns are patent. Ventricles: The ventricles are normal. Vascular: No hyperdense vessel or unexpected calcification. Skull: No acute or aggressive finding. Orbits: Orbits are symmetric. Sinuses: The visualized paranasal sinuses are clear. Other: Mastoid  air cells are clear. ASPECTS [facility] Stroke Program Early CT Score) - Ganglionic level infarction (caudate, lentiform nuclei, internal capsule, insula, M1-M3 cortex): 6 - Supraganglionic infarction (M4-M6 cortex): 3 Total score (0-10 with 10 being normal): 9 IMPRESSION: 1. Right middle cranial fossa is slightly limited by artifact. Within these limitations there is concern for loss of gray-white differentiation within the anterior left temporal lobe concerning for acute infarct. Recommend MRI for further evaluation. 2. 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 3. Chronic microvascular ischemic changes. 4. ASPECTS is 9 These results were communicated to Dr. Doretta Gant At 3:19 pm on 08/08/2023 by text page via the [facility] messaging system. Electronically Signed   By: Denny Flack M.D.   On: 08/08/2023 15:19    EKG: Independently reviewed. NSR, no acute St T wave changes  Assessment/Plan     Syncope   Vague neurological deficits  ?  CVA (cerebral vascular accident) on CT head -Difficult to tease out how much of her deficits are new, considering underlying multiple sclerosis with moderate bilateral lower extremity weakness and mobility deficits at baseline, some limitation from pain after her fall as well -CT head  does raise concern for anterior temporal lobe abnormality concerning for acute CVA, neurology has seen the patient and ordered MRI brain without contrast as well as CTA -Will check 2D echocardiogram, lipid panel and HbA1c -Aspirin  and Plavix -In addition also considering her syncopal event we will check EEG and Depakote level -PT OT, SLP eval -Anticipate need for rehab     Multiple sclerosis (HCC) Chronic pain -Resume home regimen of antispasmodics and narcotics including fentanyl  patch  Hypertension Hold lisinopril  for more permissive hypertension in the setting    Tobacco abuse    DM2 (diabetes mellitus, type 2) (HCC) -Resume glargine at a lower dose, on Tresiba 50 units at baseline    Protein-calorie malnutrition, severe (HCC)    Seizure (HCC) -Resume home dose of Topamax  and Depakote - Check level in a.m. and EEG as above  Mild thrombocytopenia - Likely secondary to Depakote, monitor   DVT prophylaxis: lovenox  Code Status: Full Code Family Communication: none present Disposition Plan: obs   Deforest Fast MD [facility]   08/08/2023, 6:21 PM

## 2023-08-09 ENCOUNTER — Observation Stay (HOSPITAL_COMMUNITY)

## 2023-08-09 DIAGNOSIS — Z79899 Other long term (current) drug therapy: Secondary | ICD-10-CM | POA: Diagnosis not present

## 2023-08-09 DIAGNOSIS — Z885 Allergy status to narcotic agent status: Secondary | ICD-10-CM | POA: Diagnosis not present

## 2023-08-09 DIAGNOSIS — Z833 Family history of diabetes mellitus: Secondary | ICD-10-CM | POA: Diagnosis not present

## 2023-08-09 DIAGNOSIS — M6281 Muscle weakness (generalized): Secondary | ICD-10-CM | POA: Diagnosis not present

## 2023-08-09 DIAGNOSIS — Z7984 Long term (current) use of oral hypoglycemic drugs: Secondary | ICD-10-CM | POA: Diagnosis not present

## 2023-08-09 DIAGNOSIS — M5125 Other intervertebral disc displacement, thoracolumbar region: Secondary | ICD-10-CM | POA: Diagnosis not present

## 2023-08-09 DIAGNOSIS — M47814 Spondylosis without myelopathy or radiculopathy, thoracic region: Secondary | ICD-10-CM | POA: Diagnosis not present

## 2023-08-09 DIAGNOSIS — I639 Cerebral infarction, unspecified: Secondary | ICD-10-CM | POA: Diagnosis present

## 2023-08-09 DIAGNOSIS — M1990 Unspecified osteoarthritis, unspecified site: Secondary | ICD-10-CM | POA: Diagnosis not present

## 2023-08-09 DIAGNOSIS — Z886 Allergy status to analgesic agent status: Secondary | ICD-10-CM | POA: Diagnosis not present

## 2023-08-09 DIAGNOSIS — E669 Obesity, unspecified: Secondary | ICD-10-CM | POA: Diagnosis present

## 2023-08-09 DIAGNOSIS — M5124 Other intervertebral disc displacement, thoracic region: Secondary | ICD-10-CM | POA: Diagnosis not present

## 2023-08-09 DIAGNOSIS — E640 Sequelae of protein-calorie malnutrition: Secondary | ICD-10-CM | POA: Diagnosis not present

## 2023-08-09 DIAGNOSIS — M545 Low back pain, unspecified: Secondary | ICD-10-CM | POA: Diagnosis not present

## 2023-08-09 DIAGNOSIS — R55 Syncope and collapse: Secondary | ICD-10-CM

## 2023-08-09 DIAGNOSIS — I10 Essential (primary) hypertension: Secondary | ICD-10-CM | POA: Diagnosis present

## 2023-08-09 DIAGNOSIS — Z8673 Personal history of transient ischemic attack (TIA), and cerebral infarction without residual deficits: Secondary | ICD-10-CM

## 2023-08-09 DIAGNOSIS — Z7985 Long-term (current) use of injectable non-insulin antidiabetic drugs: Secondary | ICD-10-CM | POA: Diagnosis not present

## 2023-08-09 DIAGNOSIS — M546 Pain in thoracic spine: Secondary | ICD-10-CM | POA: Diagnosis not present

## 2023-08-09 DIAGNOSIS — D696 Thrombocytopenia, unspecified: Secondary | ICD-10-CM | POA: Diagnosis present

## 2023-08-09 DIAGNOSIS — R519 Headache, unspecified: Secondary | ICD-10-CM | POA: Diagnosis not present

## 2023-08-09 DIAGNOSIS — E1165 Type 2 diabetes mellitus with hyperglycemia: Secondary | ICD-10-CM | POA: Diagnosis present

## 2023-08-09 DIAGNOSIS — R531 Weakness: Secondary | ICD-10-CM | POA: Diagnosis not present

## 2023-08-09 DIAGNOSIS — Z9049 Acquired absence of other specified parts of digestive tract: Secondary | ICD-10-CM | POA: Diagnosis not present

## 2023-08-09 DIAGNOSIS — E785 Hyperlipidemia, unspecified: Secondary | ICD-10-CM | POA: Diagnosis present

## 2023-08-09 DIAGNOSIS — E119 Type 2 diabetes mellitus without complications: Secondary | ICD-10-CM

## 2023-08-09 DIAGNOSIS — G8190 Hemiplegia, unspecified affecting unspecified side: Secondary | ICD-10-CM | POA: Diagnosis not present

## 2023-08-09 DIAGNOSIS — F1120 Opioid dependence, uncomplicated: Secondary | ICD-10-CM | POA: Diagnosis present

## 2023-08-09 DIAGNOSIS — I951 Orthostatic hypotension: Secondary | ICD-10-CM

## 2023-08-09 DIAGNOSIS — R1312 Dysphagia, oropharyngeal phase: Secondary | ICD-10-CM | POA: Diagnosis not present

## 2023-08-09 DIAGNOSIS — N3289 Other specified disorders of bladder: Secondary | ICD-10-CM | POA: Diagnosis not present

## 2023-08-09 DIAGNOSIS — M4804 Spinal stenosis, thoracic region: Secondary | ICD-10-CM | POA: Diagnosis not present

## 2023-08-09 DIAGNOSIS — Z794 Long term (current) use of insulin: Secondary | ICD-10-CM | POA: Diagnosis not present

## 2023-08-09 DIAGNOSIS — E43 Unspecified severe protein-calorie malnutrition: Secondary | ICD-10-CM | POA: Diagnosis present

## 2023-08-09 DIAGNOSIS — E782 Mixed hyperlipidemia: Secondary | ICD-10-CM | POA: Diagnosis not present

## 2023-08-09 DIAGNOSIS — Z23 Encounter for immunization: Secondary | ICD-10-CM | POA: Diagnosis not present

## 2023-08-09 DIAGNOSIS — Z743 Need for continuous supervision: Secondary | ICD-10-CM | POA: Diagnosis not present

## 2023-08-09 DIAGNOSIS — R109 Unspecified abdominal pain: Secondary | ICD-10-CM | POA: Diagnosis not present

## 2023-08-09 DIAGNOSIS — R29890 Loss of height: Secondary | ICD-10-CM | POA: Diagnosis not present

## 2023-08-09 DIAGNOSIS — R404 Transient alteration of awareness: Secondary | ICD-10-CM | POA: Diagnosis not present

## 2023-08-09 DIAGNOSIS — Z8249 Family history of ischemic heart disease and other diseases of the circulatory system: Secondary | ICD-10-CM | POA: Diagnosis not present

## 2023-08-09 DIAGNOSIS — G40909 Epilepsy, unspecified, not intractable, without status epilepticus: Secondary | ICD-10-CM | POA: Diagnosis present

## 2023-08-09 DIAGNOSIS — F17210 Nicotine dependence, cigarettes, uncomplicated: Secondary | ICD-10-CM

## 2023-08-09 DIAGNOSIS — M5126 Other intervertebral disc displacement, lumbar region: Secondary | ICD-10-CM | POA: Diagnosis not present

## 2023-08-09 DIAGNOSIS — K219 Gastro-esophageal reflux disease without esophagitis: Secondary | ICD-10-CM | POA: Diagnosis present

## 2023-08-09 DIAGNOSIS — M25561 Pain in right knee: Secondary | ICD-10-CM | POA: Diagnosis not present

## 2023-08-09 DIAGNOSIS — F32A Depression, unspecified: Secondary | ICD-10-CM | POA: Diagnosis present

## 2023-08-09 DIAGNOSIS — M48061 Spinal stenosis, lumbar region without neurogenic claudication: Secondary | ICD-10-CM | POA: Diagnosis not present

## 2023-08-09 DIAGNOSIS — K5090 Crohn's disease, unspecified, without complications: Secondary | ICD-10-CM | POA: Diagnosis present

## 2023-08-09 DIAGNOSIS — G894 Chronic pain syndrome: Secondary | ICD-10-CM | POA: Diagnosis present

## 2023-08-09 DIAGNOSIS — E2740 Unspecified adrenocortical insufficiency: Secondary | ICD-10-CM | POA: Diagnosis present

## 2023-08-09 DIAGNOSIS — G35 Multiple sclerosis: Secondary | ICD-10-CM | POA: Diagnosis present

## 2023-08-09 DIAGNOSIS — M1711 Unilateral primary osteoarthritis, right knee: Secondary | ICD-10-CM | POA: Diagnosis not present

## 2023-08-09 DIAGNOSIS — Z7401 Bed confinement status: Secondary | ICD-10-CM | POA: Diagnosis not present

## 2023-08-09 DIAGNOSIS — G43709 Chronic migraine without aura, not intractable, without status migrainosus: Secondary | ICD-10-CM | POA: Diagnosis not present

## 2023-08-09 LAB — CBC
HCT: 44.9 % (ref 36.0–46.0)
Hemoglobin: 13.9 g/dL (ref 12.0–15.0)
MCH: 27.9 pg (ref 26.0–34.0)
MCHC: 31 g/dL (ref 30.0–36.0)
MCV: 90.2 fL (ref 80.0–100.0)
Platelets: 127 10*3/uL — ABNORMAL LOW (ref 150–400)
RBC: 4.98 MIL/uL (ref 3.87–5.11)
RDW: 13.4 % (ref 11.5–15.5)
WBC: 6.3 10*3/uL (ref 4.0–10.5)
nRBC: 0 % (ref 0.0–0.2)

## 2023-08-09 LAB — COMPREHENSIVE METABOLIC PANEL WITH GFR
ALT: 15 U/L (ref 0–44)
AST: 23 U/L (ref 15–41)
Albumin: 3.1 g/dL — ABNORMAL LOW (ref 3.5–5.0)
Alkaline Phosphatase: 68 U/L (ref 38–126)
Anion gap: 11 (ref 5–15)
BUN: 13 mg/dL (ref 6–20)
CO2: 20 mmol/L — ABNORMAL LOW (ref 22–32)
Calcium: 8.9 mg/dL (ref 8.9–10.3)
Chloride: 106 mmol/L (ref 98–111)
Creatinine, Ser: 1.02 mg/dL — ABNORMAL HIGH (ref 0.44–1.00)
GFR, Estimated: >60 mL/min (ref >60)
Glucose, Bld: 103 mg/dL — ABNORMAL HIGH (ref 70–99)
Potassium: 3.3 mmol/L — ABNORMAL LOW (ref 3.5–5.1)
Sodium: 137 mmol/L (ref 135–145)
Total Bilirubin: 0.5 mg/dL (ref 0.0–1.2)
Total Protein: 7 g/dL (ref 6.5–8.1)

## 2023-08-09 LAB — LIPID PANEL
Cholesterol: 131 mg/dL (ref 0–200)
HDL: 37 mg/dL — ABNORMAL LOW (ref >40)
LDL Cholesterol: 60 mg/dL (ref 0–99)
Total CHOL/HDL Ratio: 3.5 ratio
Triglycerides: 168 mg/dL — ABNORMAL HIGH (ref <150)
VLDL: 34 mg/dL (ref 0–40)

## 2023-08-09 LAB — URINALYSIS, ROUTINE W REFLEX MICROSCOPIC
Bilirubin Urine: NEGATIVE
Glucose, UA: >=500 mg/dL — AB
Hgb urine dipstick: NEGATIVE
Ketones, ur: 5 mg/dL — AB
Leukocytes,Ua: NEGATIVE
Nitrite: NEGATIVE
Protein, ur: NEGATIVE mg/dL
Specific Gravity, Urine: 1.032 — ABNORMAL HIGH (ref 1.005–1.030)
pH: 5 (ref 5.0–8.0)

## 2023-08-09 LAB — CBG MONITORING, ED
Glucose-Capillary: 107 mg/dL — ABNORMAL HIGH (ref 70–99)
Glucose-Capillary: 141 mg/dL — ABNORMAL HIGH (ref 70–99)
Glucose-Capillary: 188 mg/dL — ABNORMAL HIGH (ref 70–99)

## 2023-08-09 LAB — ECHOCARDIOGRAM COMPLETE
Area-P 1/2: 3.65 cm2
Height: 69 in
S' Lateral: 3.7 cm
Weight: 3264 [oz_av]

## 2023-08-09 LAB — VALPROIC ACID LEVEL: Valproic Acid Lvl: 57 ug/mL (ref 50–100)

## 2023-08-09 LAB — HIV ANTIBODY (ROUTINE TESTING W REFLEX): HIV Screen 4th Generation wRfx: NONREACTIVE

## 2023-08-09 LAB — GLUCOSE, CAPILLARY: Glucose-Capillary: 243 mg/dL — ABNORMAL HIGH (ref 70–99)

## 2023-08-09 MED ORDER — ONDANSETRON HCL 4 MG/2ML IJ SOLN: 4.0000 mg | Freq: Four times a day (QID) | INTRAMUSCULAR | Status: DC | PRN

## 2023-08-09 MED ORDER — DIVALPROEX SODIUM ER 500 MG PO TB24
500.0000 mg | ORAL_TABLET | Freq: Every day | ORAL | Status: DC
  Administered 2023-08-09 – 2023-08-12 (×5): 500 mg via ORAL
  Filled 2023-08-09 (×6): qty 1

## 2023-08-09 MED ORDER — NORTRIPTYLINE HCL 25 MG PO CAPS
75.0000 mg | ORAL_CAPSULE | Freq: Every day | ORAL | Status: DC
  Administered 2023-08-09 – 2023-08-12 (×4): 75 mg via ORAL
  Filled 2023-08-09 (×5): qty 3

## 2023-08-09 MED ORDER — ACETAMINOPHEN 325 MG PO TABS
650.0000 mg | ORAL_TABLET | Freq: Four times a day (QID) | ORAL | Status: AC | PRN
Start: 2023-08-09 — End: ?
  Administered 2023-08-09 (×2): 650 mg via ORAL
  Filled 2023-08-09: qty 2

## 2023-08-09 MED ORDER — ONDANSETRON HCL 4 MG PO TABS: 4.0000 mg | ORAL_TABLET | Freq: Four times a day (QID) | ORAL | Status: DC | PRN

## 2023-08-09 MED ORDER — FOLIC ACID 1 MG PO TABS
1.0000 mg | ORAL_TABLET | Freq: Every day | ORAL | Status: DC
  Administered 2023-08-09 – 2023-08-13 (×5): 1 mg via ORAL
  Filled 2023-08-09 (×5): qty 1

## 2023-08-09 MED ORDER — DICYCLOMINE HCL 10 MG PO CAPS
10.0000 mg | ORAL_CAPSULE | Freq: Three times a day (TID) | ORAL | Status: DC
  Administered 2023-08-09 (×2): 10 mg via ORAL
  Filled 2023-08-09 (×5): qty 1

## 2023-08-09 MED ORDER — PANTOPRAZOLE SODIUM 40 MG PO TBEC
40.0000 mg | DELAYED_RELEASE_TABLET | Freq: Every day | ORAL | Status: DC
  Administered 2023-08-09 – 2023-08-13 (×5): 40 mg via ORAL
  Filled 2023-08-09 (×5): qty 1

## 2023-08-09 MED ORDER — ROSUVASTATIN CALCIUM 5 MG PO TABS
5.0000 mg | ORAL_TABLET | Freq: Every day | ORAL | Status: DC
  Administered 2023-08-09 – 2023-08-13 (×5): 5 mg via ORAL
  Filled 2023-08-09 (×5): qty 1

## 2023-08-09 MED ORDER — BUPROPION HCL ER (XL) 150 MG PO TB24
150.0000 mg | ORAL_TABLET | Freq: Every morning | ORAL | Status: DC
  Administered 2023-08-09 – 2023-08-13 (×5): 150 mg via ORAL
  Filled 2023-08-09 (×5): qty 1

## 2023-08-09 MED ORDER — ENOXAPARIN SODIUM 40 MG/0.4ML IJ SOSY
40.0000 mg | PREFILLED_SYRINGE | INTRAMUSCULAR | Status: DC
  Administered 2023-08-09: 40 mg via SUBCUTANEOUS
  Filled 2023-08-09: qty 0.4

## 2023-08-09 MED ORDER — DULOXETINE HCL 60 MG PO CPEP
60.0000 mg | ORAL_CAPSULE | Freq: Every day | ORAL | Status: DC
  Administered 2023-08-09 – 2023-08-13 (×5): 60 mg via ORAL
  Filled 2023-08-09 (×5): qty 1

## 2023-08-09 MED ORDER — PERFLUTREN LIPID MICROSPHERE
1.0000 mL | INTRAVENOUS | Status: AC | PRN
  Administered 2023-08-09: 3 mL via INTRAVENOUS

## 2023-08-09 MED ORDER — ACETAMINOPHEN 650 MG RE SUPP: 650.0000 mg | Freq: Four times a day (QID) | RECTAL | Status: DC | PRN

## 2023-08-09 MED ORDER — POTASSIUM CHLORIDE CRYS ER 20 MEQ PO TBCR
40.0000 meq | EXTENDED_RELEASE_TABLET | Freq: Once | ORAL | Status: AC
  Administered 2023-08-09: 40 meq via ORAL
  Filled 2023-08-09: qty 2

## 2023-08-09 MED ORDER — ASPIRIN 81 MG PO TBEC
81.0000 mg | DELAYED_RELEASE_TABLET | Freq: Every day | ORAL | Status: DC
  Administered 2023-08-09 – 2023-08-13 (×5): 81 mg via ORAL
  Filled 2023-08-09 (×5): qty 1

## 2023-08-09 MED ORDER — FENTANYL 25 MCG/HR TD PT72
1.0000 | MEDICATED_PATCH | TRANSDERMAL | Status: DC
  Administered 2023-08-09 – 2023-08-12 (×2): 1 via TRANSDERMAL
  Filled 2023-08-09: qty 1

## 2023-08-09 MED ORDER — TOPIRAMATE 100 MG PO TABS
200.0000 mg | ORAL_TABLET | Freq: Two times a day (BID) | ORAL | Status: DC
  Administered 2023-08-09 – 2023-08-13 (×10): 200 mg via ORAL
  Filled 2023-08-09 (×2): qty 2
  Filled 2023-08-09: qty 8
  Filled 2023-08-09 (×6): qty 2
  Filled 2023-08-09: qty 8

## 2023-08-09 MED ORDER — OXYCODONE HCL 5 MG PO TABS
5.0000 mg | ORAL_TABLET | Freq: Four times a day (QID) | ORAL | Status: DC | PRN
  Administered 2023-08-09 – 2023-08-13 (×8): 5 mg via ORAL
  Filled 2023-08-09 (×8): qty 1

## 2023-08-09 MED ORDER — INSULIN GLARGINE 100 UNIT/ML ~~LOC~~ SOLN
35.0000 [IU] | Freq: Every day | SUBCUTANEOUS | Status: DC
  Administered 2023-08-09 – 2023-08-12 (×4): 35 [IU] via SUBCUTANEOUS
  Filled 2023-08-09 (×5): qty 0.35

## 2023-08-09 NOTE — ED Notes (Signed)
 Pt spouse to nurses desk requesting to speak with MD.  This RN private messaged Dr Drexel Gentles his request.

## 2023-08-09 NOTE — Evaluation (Signed)
 Occupational Therapy Evaluation Patient Details Name: Andrea Wade MRN: [id number] DOB: [birth year] Today's Date: 08/09/2023   History of Present Illness   68 F adm 08/08/23 with history of multiple sclerosis, seizure disorder on Depakote and Topamax , headaches, chronic migraines, chronic pain, narcotic dependence, type 2 diabetes mellitus, arthritis, chronic back pain was brought to the ED as a code stroke for left-sided weakness.  MR Brain:  No acute intracranial abnormality.  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     Clinical Impressions Patient admitted for the diagnosis above.  PTA she lives at home with her spouse, who works during the day.  Typically she stays in her bedroom while he is at work, but does mobilize to the bathroom as needed with a history of falls.  Patient mostly performed sink baths, and her spouse assisted with iADL, showers and community mobility.  Patient presents with the deficits below, currently needing +2 for simple transfers and Max A for ADL completion.  OT will continue efforts in the acute setting to address deficits, and the patient will benefit from continued inpatient follow up therapy, <3 hours/day.     If plan is discharge home, recommend the following:   A lot of help with bathing/dressing/bathroom;Two people to help with walking and/or transfers;Assist for transportation     Functional Status Assessment   Patient has had a recent decline in their functional status and demonstrates the ability to make significant improvements in function in a reasonable and predictable amount of time.     Equipment Recommendations   None recommended by OT     Recommendations for Other Services         Precautions/Restrictions   Precautions Precautions: Fall Restrictions Weight Bearing Restrictions Per Provider Order: No     Mobility Bed Mobility Overal bed mobility: Needs Assistance Bed Mobility: Sidelying to Sit, Sit to  Sidelying   Sidelying to sit: Mod assist, +2 for physical assistance     Sit to sidelying: Mod assist, +2 for physical assistance      Transfers Overall transfer level: Needs assistance Equipment used: Rolling walker (2 wheels) Transfers: Sit to/from Stand Sit to Stand: Mod assist, +2 physical assistance                  Balance Overall balance assessment: Needs assistance Sitting-balance support: Feet unsupported Sitting balance-Leahy Scale: Fair     Standing balance support: Reliant on assistive device for balance, Bilateral upper extremity supported Standing balance-Leahy Scale: Poor                             ADL either performed or assessed with clinical judgement   ADL Overall ADL's : Needs assistance/impaired Eating/Feeding: Set up;Sitting   Grooming: Minimal assistance;Sitting   Upper Body Bathing: Moderate assistance;Sitting   Lower Body Bathing: Maximal assistance;Bed level   Upper Body Dressing : Moderate assistance;Sitting   Lower Body Dressing: Maximal assistance;Sitting/lateral leans   Toilet Transfer: Moderate assistance;Rolling walker (2 wheels);BSC/3in1;+2 for physical assistance;Stand-pivot       Tub/ Shower Transfer: Total assistance           Vision Patient Visual Report: Diplopia;No change from baseline       Perception Perception: Within Functional Limits       Praxis Praxis: [facility]       Pertinent Vitals/Pain Pain Assessment Pain Assessment: Faces Faces Pain Scale: Hurts even more Pain Location: Back, R shoulder and R knee Pain Descriptors /  Indicators: Guarding, Grimacing Pain Intervention(s): Monitored during session     Extremity/Trunk Assessment Upper Extremity Assessment Upper Extremity Assessment: Generalized weakness;Right hand dominant;RUE deficits/detail;LUE deficits/detail RUE Deficits / Details: RA deformities to hand RUE: Shoulder pain with ROM RUE Coordination: decreased fine motor LUE  Deficits / Details: RA deformities to the hand LUE Coordination: decreased fine motor   Lower Extremity Assessment Lower Extremity Assessment: Defer to PT evaluation   Cervical / Trunk Assessment Cervical / Trunk Assessment: Kyphotic   Communication Communication Communication: No apparent difficulties   Cognition Arousal: Alert Behavior During Therapy: WFL for tasks assessed/performed Cognition: No apparent impairments                               Following commands: Intact       Cueing  General Comments       BP dropped with supine to sit with dizziness.     Exercises     Shoulder Instructions      Home Living Family/patient expects to be discharged to:: Private residence Living Arrangements: Spouse/significant other Available Help at Discharge: Family;Available PRN/intermittently Type of Home: House Home Access: Ramped entrance     Home Layout: One level     Bathroom Shower/Tub: Tub/shower unit;Sponge bathes at baseline   [employer]: Standard Bathroom Accessibility: Yes How Accessible: Accessible via walker Home Equipment: Shower seat;Toilet riser;Wheelchair Financial trader (4 wheels);Wheelchair - power   Additional Comments: power chair is in storage      Prior Functioning/Environment Prior Level of Function : Needs assist       Physical Assist : ADLs (physical)   ADLs (physical): IADLs   ADLs Comments: Patient only showers when spouse is able to assist with transferring in/out of bathtub.  Spouse assists with iADL and community mobility.    OT Problem List: Decreased strength;Decreased range of motion;Decreased activity tolerance;Impaired balance (sitting and/or standing);Pain   OT Treatment/Interventions: Self-care/ADL training;Therapeutic activities;Therapeutic exercise;Patient/family education;Balance training;DME and/or AE instruction      OT Goals(Current goals can be found in the care plan section)   Acute Rehab  OT Goals Patient Stated Goal: Return home OT Goal Formulation: With patient Time For Goal Achievement: 08/23/23 Potential to Achieve Goals: Fair ADL Goals Pt Will Perform Grooming: with set-up;sitting;standing Pt Will Perform Upper Body Dressing: with supervision;sitting Pt Will Perform Lower Body Dressing: with min assist;sit to/from stand Pt Will Transfer to Toilet: with min assist;stand pivot transfer;bedside commode   OT Frequency:  Min 2X/week    Co-evaluation PT/OT/SLP Co-Evaluation/Treatment: Yes Reason for Co-Treatment: Complexity of the patient's impairments (multi-system involvement)   OT goals addressed during session: ADL's and self-care      AM-PAC OT "6 Clicks" Daily Activity     Outcome Measure Help from another person eating meals?: A Little Help from another person taking care of personal grooming?: A Little Help from another person toileting, which includes using toliet, bedpan, or urinal?: A Lot Help from another person bathing (including washing, rinsing, drying)?: A Lot Help from another person to put on and taking off regular upper body clothing?: A Lot Help from another person to put on and taking off regular lower body clothing?: A Lot 6 Click Score: 14   End of Session Equipment Utilized During Treatment: Rolling walker (2 wheels) Nurse Communication: Mobility status  Activity Tolerance: Patient limited by pain Patient left: in bed;with call bell/phone within reach  OT Visit Diagnosis: Unsteadiness on feet (R26.81);Muscle weakness (generalized) (M62.81);History  of falling (Z91.81);Pain Pain - Right/Left: Left Pain - part of body: Shoulder                Time: 1610-9604 OT Time Calculation (min): 27 min Charges:  OT General Charges $OT Visit: 1 Visit OT Evaluation $OT Eval Moderate Complexity: 1 Mod  08/09/2023  RP, OTR/L  Acute Rehabilitation Services  Office:  [phone]   Benjamen Brand 08/09/2023, 11:57 AM

## 2023-08-09 NOTE — Evaluation (Addendum)
 Physical Therapy Evaluation Patient Details Name: Andrea Wade MRN: [id number] DOB: [birth year] Today's Date: 08/09/2023  History of Present Illness  19 F adm 08/08/23 brought to the ED as a code stroke for left-sided weakness.  MR Brain:  No acute intracranial abnormality.  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 PMH includes multiple sclerosis, seizure disorder on Depakote and Topamax , headaches, chronic migraines, chronic pain, narcotic dependence, type 2 diabetes mellitus, arthritis, chronic back pain.  Clinical Impression   Pt presents with LE weakness roughly symmetric, impaired seated and standing balance with history of falls, impaired activity tolerance, and body-wide pain mostly in [place] this date. Pt to benefit from acute PT to address deficits. Pt stood with mod +2 assist, unable to progress to taking steps due to LE weakness and pain. At baseline pt is independent with rollator for household distances, Patient will benefit from continued inpatient follow up therapy, <3 hours/day. PT to progress mobility as tolerated, and will continue to follow acutely.   BP, HR: - at rest, supine: 111/67, 76 - sitting EOB, + dizziness: 82/64, 93 - sitting EOB x3 minutes, dizziness passed: 109/75, 90s         If plan is discharge home, recommend the following: A lot of help with walking and/or transfers;A lot of help with bathing/dressing/bathroom   Can travel by private vehicle   No    Equipment Recommendations None recommended by PT  Recommendations for Other Services       Functional Status Assessment Patient has had a recent decline in their functional status and demonstrates the ability to make significant improvements in function in a reasonable and predictable amount of time.     Precautions / Restrictions Precautions Precautions: Fall Restrictions Weight Bearing Restrictions Per Provider Order: No      Mobility  Bed Mobility Overal bed mobility: Needs  Assistance Bed Mobility: Sidelying to Sit, Sit to Sidelying   Sidelying to sit: Mod assist, +2 for physical assistance     Sit to sidelying: Mod assist, +2 for physical assistance General bed mobility comments: assist for trunk and LE management, max sequencing cues    Transfers Overall transfer level: Needs assistance Equipment used: Rolling walker (2 wheels) Transfers: Sit to/from Stand Sit to Stand: Mod assist, +2 physical assistance           General transfer comment: assist for power up, rise, steady. Attempted to take steps but pt unable given LE weakness and pain.    Ambulation/Gait                  Stairs            Wheelchair Mobility     Tilt Bed    Modified Rankin (Stroke Patients Only)       Balance Overall balance assessment: Needs assistance Sitting-balance support: Feet unsupported Sitting balance-Leahy Scale: Fair     Standing balance support: Reliant on assistive device for balance, Bilateral upper extremity supported Standing balance-Leahy Scale: Poor                               Pertinent Vitals/Pain Pain Assessment Pain Assessment: Faces Faces Pain Scale: Hurts even more Pain Location: Back, R shoulder and R knee Pain Descriptors / Indicators: Guarding, Grimacing Pain Intervention(s): Limited activity within patient's tolerance, Monitored during session, Repositioned    Home Living Family/patient expects to be discharged to:: Private residence Living Arrangements: Spouse/significant other Available Help at  Discharge: Family;Available PRN/intermittently Type of Home: House Home Access: Ramped entrance       Home Layout: One level Home Equipment: Shower seat;Toilet riser;Wheelchair Financial trader (4 wheels);Wheelchair - power Additional Comments: power chair is in storage, missing a working battery which pt states is in the works    Prior Function Prior Level of Function : Needs assist        Physical Assist : ADLs (physical)   ADLs (physical): IADLs Mobility Comments: uses rollator for gait, states she walks short distance and uses w/c for longer distances. Does not always use rollator when up and walking in bedroom, "furniture surfs" ADLs Comments: Patient only showers when spouse is able to assist with transferring in/out of bathtub.  Spouse assists with iADL and community mobility.     Extremity/Trunk Assessment   Upper Extremity Assessment Upper Extremity Assessment: Defer to OT evaluation RUE Deficits / Details: RA deformities to hand RUE: Shoulder pain with ROM RUE Coordination: decreased fine motor LUE Deficits / Details: RA deformities to the hand LUE Coordination: decreased fine motor    Lower Extremity Assessment Lower Extremity Assessment: Generalized weakness (grossly 2/5 on MMT, limited by pain)    Cervical / Trunk Assessment Cervical / Trunk Assessment: Kyphotic  Communication   Communication Communication: No apparent difficulties    Cognition Arousal: Alert Behavior During Therapy: WFL for tasks assessed/performed   PT - Cognitive impairments: No apparent impairments                         Following commands: Intact       Cueing       General Comments      Exercises     Assessment/Plan    PT Assessment Patient needs continued PT services  PT Problem List Decreased strength;Decreased mobility;Decreased balance;Decreased activity tolerance;Decreased knowledge of use of DME;Pain;Decreased safety awareness;Cardiopulmonary status limiting activity       PT Treatment Interventions DME instruction;Therapeutic activities;Gait training;Therapeutic exercise;Patient/family education;Balance training;Stair training;Functional mobility training;Neuromuscular re-education    PT Goals (Current goals can be found in the Care Plan section)  Acute Rehab PT Goals Patient Stated Goal: back to baseline PT Goal Formulation: With  patient Time For Goal Achievement: 08/23/23 Potential to Achieve Goals: Good    Frequency Min 2X/week     Co-evaluation PT/OT/SLP Co-Evaluation/Treatment: Yes Reason for Co-Treatment: Complexity of the patient's impairments (multi-system involvement) PT goals addressed during session: Mobility/safety with mobility;Balance OT goals addressed during session: ADL's and self-care       AM-PAC PT "6 Clicks" Mobility  Outcome Measure Help needed turning from your back to your side while in a flat bed without using bedrails?: A Little Help needed moving from lying on your back to sitting on the side of a flat bed without using bedrails?: A Lot Help needed moving to and from a bed to a chair (including a wheelchair)?: A Lot Help needed standing up from a chair using your arms (e.g., wheelchair or bedside chair)?: A Lot Help needed to walk in hospital room?: Total Help needed climbing 3-5 steps with a railing? : Total 6 Click Score: 11    End of Session   Activity Tolerance: Patient tolerated treatment well Patient left: with call bell/phone within reach;in bed;Other (comment) (on ED stretcher) Nurse Communication: Mobility status PT Visit Diagnosis: Other abnormalities of gait and mobility (R26.89);Muscle weakness (generalized) (M62.81)    Time: 2536-6440 PT Time Calculation (min) (ACUTE ONLY): 24 min   Charges:  PT Evaluation $PT Eval Low Complexity: 1 Low   PT General Charges $$ ACUTE PT VISIT: 1 Visit         Shirlene Doughty, PT DPT Acute Rehabilitation Services Secure Chat Preferred  Office [phone]   Abbi Mancini Cydney Draft 08/09/2023, 12:14 PM

## 2023-08-09 NOTE — Progress Notes (Signed)
  Echocardiogram 2D Echocardiogram has been performed.  Fain Home RDCS 08/09/2023, 9:02 AM

## 2023-08-09 NOTE — Progress Notes (Addendum)
 PROGRESS NOTE    Andrea Wade  ZOX:[id number] DOB: [birth year] DOA: 08/08/2023 PCP: O'Buch, Greta, PA-C  52/F with history of multiple sclerosis, seizure disorder on Depakote and Topamax , headaches, chronic migraines, chronic pain, narcotic dependence, type 2 diabetes mellitus, arthritis, chronic back pain was brought to the ED as a code stroke for left-sided weakness.  Patient reports that she usually uses rollator or wheelchair to ambulate, she tried to walk to the bathroom holding the sides and next thing she remembers is being down on the floor, recalling hitting her head, back, knee etc. -Subsequent to this reports having increased pain and weakness in both legs especially the right, also reports pain in her mid back, EMS was called and brought to the ED -Vital signs were stable, CBG 140, BMP largely unremarkable except low magnesium and low platelets, CT head limited by motion artifact, cannot rule out abnormality in anterior left temporal lobe concerning for acute infarct.   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, add compression stockings - Currently on aspirin  and Plavix -Hold off on EEG, Depakote level is 57 - Continue PT OT, may benefit from short-term rehab     Multiple sclerosis (HCC) Chronic pain -Resume home regimen of antispasmodics and oxycodone  and fentanyl  patch -Imaging of knee and thoracic  spine without fractures -Needs close follow-up with her neurologist in [place]   Hypertension -Lisinopril  on hold     Tobacco abuse     DM2 (diabetes mellitus, type 2) (HCC) - Continue glargine at a lower dose, on Tresiba 50 units at baseline     Protein-calorie malnutrition, severe (HCC)     Seizure (HCC) - Continue home dose of Topamax  and Depakote   Mild thrombocytopenia - Likely secondary to Depakote, monitor     DVT prophylaxis: lovenox  Code Status: Full Code Family Communication: none present     DVT prophylaxis: Code Status:  Family Communication: Disposition Plan:   Consultants:    Procedures:   Antimicrobials:    Objective: Vitals:   08/09/23 0300 08/09/23 0559 08/09/23 0900 08/09/23 1000  BP: 120/83 (!) 134/90 126/75 113/72  Pulse: 77 76 78 79  Resp: 20 16 20 20   Temp:  97.9 F (36.6 C) 97.7 F (36.5 C)   TempSrc:  Oral Oral   SpO2: 97% 97% 98% 96%  Weight:      Height:       No intake or output data in the 24 hours ending 08/09/23 1138 Filed Weights   08/08/23 1459 08/08/23 1518  Weight: 92.2 kg 92.5 kg    Examination:  General exam: Appears calm and comfortable, AAO x 3 Respiratory system: Clear to auscultation Cardiovascular system: S1 & S2 heard, RRR.  Abd: nondistended, soft and nontender.Normal bowel sounds heard. Central nervous system: Moderate bilateral lower extremity weakness, and some part limited by pain as well Extremities: no edema Skin: No rashes Psychiatry:  Mood & affect appropriate.     Data Reviewed:  CBC: Recent Labs  Lab 08/08/23 1643 08/09/23 0522  WBC 7.1 6.3  NEUTROABS 2.9  --   HGB 14.1 13.9  HCT 45.2 44.9  MCV 90.6 90.2  PLT 130* 127*   Basic Metabolic Panel: Recent Labs  Lab 08/08/23 1643 08/09/23 0522  NA 137 137  K 4.1 3.3*  CL 106 106  CO2 19* 20*  GLUCOSE 134* 103*  BUN 17 13  CREATININE 1.13* 1.02*  CALCIUM  9.1 8.9  MG 1.6*  --    GFR: Estimated Creatinine Clearance:  77.2 mL/min (A) (by C-G formula based on SCr of 1.02 mg/dL (H)). Liver Function Tests: Recent Labs  Lab 08/08/23 1643 08/09/23 0522  AST 27 23  ALT 16 15  ALKPHOS 56 68  BILITOT 0.7 0.5  PROT 7.2 7.0  ALBUMIN 3.1* 3.1*   No results for input(s): "LIPASE", "AMYLASE" in the last 168 hours. No results for input(s): "AMMONIA" in the last 168 hours. Coagulation Profile: No results for input(s): "INR", "PROTIME" in the last 168 hours. Cardiac Enzymes: No results for input(s): "CKTOTAL", "CKMB", "CKMBINDEX", "TROPONINI" in the last 168 hours. BNP (last 3 results) No results for input(s): "PROBNP" in the last 8760 hours. HbA1C: No results for input(s): "HGBA1C" in the last 72 hours. CBG: Recent Labs  Lab 08/08/23 1522 08/09/23 0801  GLUCAP 143* 107*   Lipid Profile: Recent Labs    08/09/23 0522  CHOL 131  HDL 37*  LDLCALC 60  TRIG 102*  CHOLHDL 3.5   Thyroid  Function Tests: No results for input(s): "TSH", "T4TOTAL", "FREET4", "T3FREE", "THYROIDAB" in the last 72 hours. Anemia Panel: No results for input(s): "VITAMINB12", "FOLATE", "FERRITIN", "TIBC", "IRON", "RETICCTPCT" in the last 72 hours. Urine analysis:    Component Value Date/Time   COLORURINE STRAW (A) 09/02/2020 0552   APPEARANCEUR CLEAR 09/02/2020 0552   LABSPEC 1.011 09/02/2020 0552   PHURINE 7.0 09/02/2020 0552   GLUCOSEU >=500 (A) 09/02/2020 0552   HGBUR NEGATIVE 09/02/2020 0552   BILIRUBINUR NEGATIVE 09/02/2020 0552   KETONESUR NEGATIVE 09/02/2020 0552   PROTEINUR NEGATIVE 09/02/2020 0552   UROBILINOGEN 0.2 07/22/2013 1703   NITRITE NEGATIVE 09/02/2020 0552   LEUKOCYTESUR NEGATIVE 09/02/2020 0552   Sepsis Labs: @LABRCNTIP (procalcitonin:4,lacticidven:4)  )No results found for this or any previous visit (from the past 240 hours).   Radiology Studies: CT THORACIC SPINE WO CONTRAST Result Date: 08/09/2023 CLINICAL DATA:  Mid back pain EXAM: CT THORACIC SPINE WITHOUT CONTRAST TECHNIQUE: Multidetector  CT images of the thoracic were obtained using the standard protocol without intravenous contrast. RADIATION DOSE REDUCTION: This exam was performed according to the departmental dose-optimization program which includes automated exposure control, adjustment of the mA and/or kV according to patient size and/or use of iterative reconstruction technique. COMPARISON:  None Available. FINDINGS: Alignment: Normal. Vertebrae: Mild chronic height loss at T2 Paraspinal and other soft tissues: Negative. Disc levels: No spinal canal stenosis IMPRESSION: 1. No acute fracture or static subluxation of the thoracic spine. 2. Mild chronic height loss at T2. Electronically Signed   By: Juanetta Nordmann M.D.   On: 08/09/2023 04:06   DG Knee 1-2 Views Right Result Date: 08/09/2023 CLINICAL DATA:  Right knee pain EXAM: RIGHT KNEE - 2 VIEW COMPARISON:  12/16/2022 FINDINGS: Tricompartmental degenerative changes are noted. No joint effusion is seen. No acute fracture or dislocation is noted. IMPRESSION: Degenerative change without acute abnormality. Electronically Signed   By: Violeta Grey M.D.   On: 08/09/2023 03:18   MR BRAIN WO CONTRAST Result Date: 08/08/2023  CLINICAL DATA:  Acute neurologic deficit EXAM: MRI HEAD WITHOUT CONTRAST TECHNIQUE: Multiplanar, multiecho pulse sequences of the brain and surrounding structures were obtained without intravenous contrast. COMPARISON:  Brain MRI 11/30/2022 FINDINGS: Brain: No acute infarct, mass effect or extra-axial collection. Hemosiderin deposition in the posterior left hemisphere at the site of old infarct. Old right occipital infarct. There is multifocal hyperintense T2-weighted signal within the periventricular and deep white matter. The midline structures are normal. Vascular: Normal flow voids. Skull and upper cervical spine: Normal calvarium and skull base. Visualized upper cervical spine and soft tissues are normal. Sinuses/Orbits:No paranasal sinus fluid levels or advanced mucosal  thickening. No mastoid or middle ear effusion. Normal orbits. IMPRESSION: 1. No acute intracranial abnormality. 2. 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 3. Chronic small vessel ischemia. Electronically Signed   By: Juanetta Nordmann M.D.   On: 08/08/2023 20:43   CT ANGIO HEAD NECK W WO CM Result Date: 08/08/2023 CLINICAL DATA:  Stroke/TIA, determine embolic source. EXAM: CT ANGIOGRAPHY HEAD AND NECK WITH AND WITHOUT CONTRAST TECHNIQUE: Multidetector CT imaging of the head and neck was performed using the standard protocol during bolus administration of intravenous contrast. Multiplanar CT image reconstructions and MIPs were obtained to evaluate the vascular anatomy. Carotid stenosis measurements (when applicable) are obtained utilizing NASCET criteria, using the distal internal carotid diameter as the denominator. RADIATION DOSE REDUCTION: This exam was performed according to the departmental dose-optimization program which includes automated exposure control, adjustment of the mA and/or kV according to patient size and/or use of iterative reconstruction technique. CONTRAST:  75mL OMNIPAQUE  IOHEXOL  350 MG/ML SOLN COMPARISON:  Same day CT head.  CTA head and neck 11/30/2022. FINDINGS: CTA NECK FINDINGS Aortic arch: Standard configuration of the aortic arch. Imaged portion shows no evidence of aneurysm or dissection. No significant stenosis of the major arch vessel origins. Pulmonary arteries: As permitted by contrast timing, there are no filling defects in the visualized pulmonary arteries. Subclavian arteries: The subclavian arteries are patent bilaterally. Right carotid system: No evidence of dissection, stenosis (50% or greater), or occlusion. Left carotid system: No evidence of dissection, stenosis (50% or greater), or occlusion. Minimal atherosclerosis at the carotid bifurcation. Vertebral arteries: Left vertebral artery is dominant. No evidence of dissection, stenosis (50% or greater), or occlusion.  Skeleton: No acute or aggressive finding noted. Similar mild irregularity of the T1 and T2 superior endplates. Edentulous maxilla and mandible. Other neck: The visualized airway is patent. No cervical lymphadenopathy by imaging size criteria. Similar subcentimeter level 2A cervical nodes. Subcentimeter right thyroid  nodule, no follow-up required. Upper chest: Visualized lung apices are clear. Review of the MIP images confirms the above findings CTA HEAD FINDINGS ANTERIOR CIRCULATION: The intracranial ICAs are patent bilaterally. No significant stenosis, proximal occlusion, aneurysm, or vascular malformation. MCAs: The middle cerebral arteries are patent bilaterally. ACAs: The anterior cerebral arteries are patent bilaterally. POSTERIOR CIRCULATION: No significant stenosis, proximal occlusion, aneurysm, or vascular malformation. PCAs: The posterior cerebral arteries are patent bilaterally. Pcomm: Not well visualized. SCAs: The superior cerebellar arteries are patent bilaterally. Basilar artery: Patent AICAs: Patent PICAs: Patent Vertebral arteries: The intracranial vertebral arteries are patent. Venous sinuses: As permitted by contrast timing, patent. Anatomic variants: None Review of the MIP images confirms the above findings IMPRESSION: No large vessel occlusion. No high-grade stenosis, aneurysm, or dissection of the arteries in the head and neck. Electronically Signed   By: Denny Flack M.D.   On: 08/08/2023 19:15   CT HEAD CODE STROKE WO CONTRAST Result  Date: 08/08/2023 CLINICAL DATA:  Code stroke.  Neuro deficit, concern for stroke. EXAM: CT HEAD WITHOUT CONTRAST TECHNIQUE: Contiguous axial images were obtained from the base of the skull through the vertex without intravenous contrast. RADIATION DOSE REDUCTION: This exam was performed according to the departmental dose-optimization program which includes automated exposure control, adjustment of the mA and/or kV according to patient size and/or use of  iterative reconstruction technique. COMPARISON:  MRI head 11/30/2022. FINDINGS: Brain: No acute intracranial hemorrhage. Redemonstrated remote infarct in the left temporal occipital lobes. Additional small remote infarct involving the superior right occipital lobe. There is edema at the skull base which slightly limits the right middle cranial fossa. Within this limitation there is concern for possible loss of gray-white matter differentiation. Nonspecific hypoattenuation in the periventricular and subcortical white matter favored to reflect chronic microvascular ischemic changes. No significant edema, mass effect, or midline shift. The basilar cisterns are patent. Ventricles: The ventricles are normal. Vascular: No hyperdense vessel or unexpected calcification. Skull: No acute or aggressive finding. Orbits: Orbits are symmetric. Sinuses: The visualized paranasal sinuses are clear. Other: Mastoid air cells are clear. ASPECTS [facility] Stroke Program Early CT Score) - Ganglionic level infarction (caudate, lentiform nuclei, internal capsule, insula, M1-M3 cortex): 6 - Supraganglionic infarction (M4-M6 cortex): 3 Total score (0-10 with 10 being normal): 9 IMPRESSION: 1. Right middle cranial fossa is slightly limited by artifact. Within these limitations there is concern for loss of gray-white differentiation within the anterior left temporal lobe concerning for acute infarct. Recommend MRI for further evaluation. 2. 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 3. Chronic microvascular ischemic changes. 4. ASPECTS is 9 These results were communicated to Dr. Doretta Gant At 3:19 pm on 08/08/2023 by text page via the [facility] messaging system. Electronically Signed   By: Denny Flack M.D.   On: 08/08/2023 15:19     Scheduled Meds:  aspirin  EC  81 mg Oral Daily   buPROPion   150 mg Oral q morning   dicyclomine   10 mg Oral TID AC   divalproex  500 mg Oral QHS   DULoxetine   60 mg Oral Daily    enoxaparin  (LOVENOX ) injection  40 mg Subcutaneous Q24H   fentaNYL   1 patch Transdermal Q72H   folic acid   1 mg Oral Daily   insulin  aspart  0-9 Units Subcutaneous TID WC   insulin  glargine  35 Units Subcutaneous QHS   LORazepam   1 mg Intravenous Once   nortriptyline   75 mg Oral QHS   pantoprazole   40 mg Oral Daily   rosuvastatin  5 mg Oral Daily   topiramate   200 mg Oral BID   Continuous Infusions:   LOS: 0 days    Time spent:    Deforest Fast, MD [facility]   08/09/2023, 11:38 AM

## 2023-08-09 NOTE — Progress Notes (Signed)
 Received on bed from ED.  Oriented to room and surroundings. Daughter at her side.

## 2023-08-09 NOTE — Progress Notes (Addendum)
 STROKE TEAM PROGRESS NOTE   INTERIM HISTORY/SUBJECTIVE  No family at the bedside patient is laying in the bed in no apparent distress Patient states she blacked out and hit her head multiple times on the trunk.  She states that she was dizzy headed.  CBC    Component Value Date/Time   WBC 6.3 08/09/2023 0522   RBC 4.98 08/09/2023 0522   HGB 13.9 08/09/2023 0522   HGB 12.1 02/03/2022 1052   HCT 44.9 08/09/2023 0522   PLT 127 (L) 08/09/2023 0522   PLT 218 02/03/2022 1052   MCV 90.2 08/09/2023 0522   MCV 88 11/06/2021 0000   MCH 27.9 08/09/2023 0522   MCHC 31.0 08/09/2023 0522   RDW 13.4 08/09/2023 0522   LYMPHSABS 3.2 08/08/2023 1643   MONOABS 0.6 08/08/2023 1643   EOSABS 0.3 08/08/2023 1643   BASOSABS 0.0 08/08/2023 1643    BMET    Component Value Date/Time   NA 137 08/09/2023 0522   NA 140 05/02/2021 0000   K 3.3 (L) 08/09/2023 0522   CL 106 08/09/2023 0522   CO2 20 (L) 08/09/2023 0522   GLUCOSE 103 (H) 08/09/2023 0522   BUN 13 08/09/2023 0522   BUN 18 05/02/2021 0000   CREATININE 1.02 (H) 08/09/2023 0522   CREATININE 0.92 02/03/2022 1052   CALCIUM  8.9 08/09/2023 0522   GFRNONAA >60 08/09/2023 0522   GFRNONAA >60 02/03/2022 1052    IMAGING past 24 hours CT THORACIC SPINE WO CONTRAST Result Date: 08/09/2023 CLINICAL DATA:  Mid back pain EXAM: CT THORACIC SPINE WITHOUT CONTRAST TECHNIQUE: Multidetector CT images of the thoracic were obtained using the standard protocol without intravenous contrast. RADIATION DOSE REDUCTION: This exam was performed according to the departmental dose-optimization program which includes automated exposure control, adjustment of the mA and/or kV according to patient size and/or use of iterative reconstruction technique. COMPARISON:  None Available. FINDINGS: Alignment: Normal. Vertebrae: Mild chronic height loss at T2 Paraspinal and other soft tissues: Negative. Disc levels: No spinal canal stenosis IMPRESSION: 1. No acute fracture or static  subluxation of the thoracic spine. 2. Mild chronic height loss at T2. Electronically Signed   By: Juanetta Nordmann M.D.   On: 08/09/2023 04:06   DG Knee 1-2 Views Right Result Date: 08/09/2023 CLINICAL DATA:  Right knee pain EXAM: RIGHT KNEE - 2 VIEW COMPARISON:  12/16/2022 FINDINGS: Tricompartmental degenerative changes are noted. No joint effusion is seen. No acute fracture or dislocation is noted. IMPRESSION: Degenerative change without acute abnormality. Electronically Signed   By: Violeta Grey M.D.   On: 08/09/2023 03:18   MR BRAIN WO CONTRAST Result Date: 08/08/2023 CLINICAL DATA:  Acute neurologic deficit EXAM: MRI HEAD WITHOUT CONTRAST TECHNIQUE: Multiplanar, multiecho pulse sequences of the brain and surrounding structures were obtained without intravenous contrast. COMPARISON:  Brain MRI 11/30/2022 FINDINGS: Brain: No acute infarct, mass effect or extra-axial collection. Hemosiderin deposition in the posterior left hemisphere at the site of old infarct. Old right occipital infarct. There is multifocal hyperintense T2-weighted signal within the periventricular and deep white matter. The midline structures are normal. Vascular: Normal flow voids. Skull and upper cervical spine: Normal calvarium and skull base. Visualized upper cervical spine and soft tissues are normal. Sinuses/Orbits:No paranasal sinus fluid levels or advanced mucosal thickening. No mastoid or middle ear effusion. Normal orbits. IMPRESSION: 1. No acute intracranial abnormality. 2. 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 3. Chronic small vessel ischemia. Electronically Signed   By: Juanetta Nordmann M.D.   On:  08/08/2023 20:43   CT ANGIO HEAD NECK W WO CM Result Date: 08/08/2023 CLINICAL DATA:  Stroke/TIA, determine embolic source. EXAM: CT ANGIOGRAPHY HEAD AND NECK WITH AND WITHOUT CONTRAST TECHNIQUE: Multidetector CT imaging of the head and neck was performed using the standard protocol during bolus administration of intravenous  contrast. Multiplanar CT image reconstructions and MIPs were obtained to evaluate the vascular anatomy. Carotid stenosis measurements (when applicable) are obtained utilizing NASCET criteria, using the distal internal carotid diameter as the denominator. RADIATION DOSE REDUCTION: This exam was performed according to the departmental dose-optimization program which includes automated exposure control, adjustment of the mA and/or kV according to patient size and/or use of iterative reconstruction technique. CONTRAST:  75mL OMNIPAQUE  IOHEXOL  350 MG/ML SOLN COMPARISON:  Same day CT head.  CTA head and neck 11/30/2022. FINDINGS: CTA NECK FINDINGS Aortic arch: Standard configuration of the aortic arch. Imaged portion shows no evidence of aneurysm or dissection. No significant stenosis of the major arch vessel origins. Pulmonary arteries: As permitted by contrast timing, there are no filling defects in the visualized pulmonary arteries. Subclavian arteries: The subclavian arteries are patent bilaterally. Right carotid system: No evidence of dissection, stenosis (50% or greater), or occlusion. Left carotid system: No evidence of dissection, stenosis (50% or greater), or occlusion. Minimal atherosclerosis at the carotid bifurcation. Vertebral arteries: Left vertebral artery is dominant. No evidence of dissection, stenosis (50% or greater), or occlusion. Skeleton: No acute or aggressive finding noted. Similar mild irregularity of the T1 and T2 superior endplates. Edentulous maxilla and mandible. Other neck: The visualized airway is patent. No cervical lymphadenopathy by imaging size criteria. Similar subcentimeter level 2A cervical nodes. Subcentimeter right thyroid  nodule, no follow-up required. Upper chest: Visualized lung apices are clear. Review of the MIP images confirms the above findings CTA HEAD FINDINGS ANTERIOR CIRCULATION: The intracranial ICAs are patent bilaterally. No significant stenosis, proximal occlusion,  aneurysm, or vascular malformation. MCAs: The middle cerebral arteries are patent bilaterally. ACAs: The anterior cerebral arteries are patent bilaterally. POSTERIOR CIRCULATION: No significant stenosis, proximal occlusion, aneurysm, or vascular malformation. PCAs: The posterior cerebral arteries are patent bilaterally. Pcomm: Not well visualized. SCAs: The superior cerebellar arteries are patent bilaterally. Basilar artery: Patent AICAs: Patent PICAs: Patent Vertebral arteries: The intracranial vertebral arteries are patent. Venous sinuses: As permitted by contrast timing, patent. Anatomic variants: None Review of the MIP images confirms the above findings IMPRESSION: No large vessel occlusion. No high-grade stenosis, aneurysm, or dissection of the arteries in the head and neck. Electronically Signed   By: Denny Flack M.D.   On: 08/08/2023 19:15   CT HEAD CODE STROKE WO CONTRAST Result Date: 08/08/2023 CLINICAL DATA:  Code stroke.  Neuro deficit, concern for stroke. EXAM: CT HEAD WITHOUT CONTRAST TECHNIQUE: Contiguous axial images were obtained from the base of the skull through the vertex without intravenous contrast. RADIATION DOSE REDUCTION: This exam was performed according to the departmental dose-optimization program which includes automated exposure control, adjustment of the mA and/or kV according to patient size and/or use of iterative reconstruction technique. COMPARISON:  MRI head 11/30/2022. FINDINGS: Brain: No acute intracranial hemorrhage. Redemonstrated remote infarct in the left temporal occipital lobes. Additional small remote infarct involving the superior right occipital lobe. There is edema at the skull base which slightly limits the right middle cranial fossa. Within this limitation there is concern for possible loss of gray-white matter differentiation. Nonspecific hypoattenuation in the periventricular and subcortical white matter favored to reflect chronic microvascular ischemic changes.  No significant edema,  mass effect, or midline shift. The basilar cisterns are patent. Ventricles: The ventricles are normal. Vascular: No hyperdense vessel or unexpected calcification. Skull: No acute or aggressive finding. Orbits: Orbits are symmetric. Sinuses: The visualized paranasal sinuses are clear. Other: Mastoid air cells are clear. ASPECTS [facility] Stroke Program Early CT Score) - Ganglionic level infarction (caudate, lentiform nuclei, internal capsule, insula, M1-M3 cortex): 6 - Supraganglionic infarction (M4-M6 cortex): 3 Total score (0-10 with 10 being normal): 9 IMPRESSION: 1. Right middle cranial fossa is slightly limited by artifact. Within these limitations there is concern for loss of gray-white differentiation within the anterior left temporal lobe concerning for acute infarct. Recommend MRI for further evaluation. 2. 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 3. Chronic microvascular ischemic changes. 4. ASPECTS is 9 These results were communicated to Dr. Doretta Gant At 3:19 pm on 08/08/2023 by text page via the [facility] messaging system. Electronically Signed   By: Denny Flack M.D.   On: 08/08/2023 15:19    Vitals:   08/09/23 0044 08/09/23 0200 08/09/23 0300 08/09/23 0559  BP:  127/80 120/83 (!) 134/90  Pulse:  75 77 76  Resp:  12 20 16   Temp: 98 F (36.7 C)   97.9 F (36.6 C)  TempSrc: Oral   Oral  SpO2:  98% 97% 97%  Weight:      Height:         PHYSICAL EXAM General:  Alert, well-nourished, well-developed patient in no acute distress Psych:  Mood and affect appropriate for situation CV: Regular rate and rhythm on monitor Respiratory:  Regular, unlabored respirations on room air GI: Abdomen soft and nontender   NEURO:  Mental Status: AA&Ox3, patient is able to give clear and coherent history Speech/Language: speech is without dysarthria or aphasia.  Naming, repetition, fluency, and comprehension intact.  Cranial Nerves:  II: PERRL.  Visual fields full.  III, IV, VI: EOMI. Eyelids elevate symmetrically.  V: Sensation is intact to light touch and symmetrical to face.  VII: Face is symmetrical resting and smiling VIII: hearing intact to voice. IX, X: Palate elevates symmetrically. Phonation is normal.  WU:[id number] shrug 5/5. XII: tongue is midline without fasciculations. Motor: All 4 extremities with generalized weakness, uppers 4 -/5, bilateral lowers 3/5 Tone: is normal and bulk is normal Sensation- Intact to light touch bilaterally. Extinction absent to light touch to DSS.   Coordination: FTN intact bilaterally, HKS: no ataxia in BLE.No drift.  Gait- deferred     ASSESSMENT/PLAN  Ms. Bryelle Spiewak is a 53 y.o. female with history of MS, seizure-like activity (Topamax  200mg  BID and Depakote 1000 mg daily PTA), headaches, chronic migraines, chronic pain who was BIB EMS as an activated CODE STROKE due to left-sided weakness (noted on en code called into ED) after a questionable syncopal event.   NIH on Admission 10  Syncope, likely due to orthostasis Generalized weakness Code Stroke CT head concern for loss of gray-white differentiation within the anterior left temporal lobe. 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 Chronic microvascular ischemic changes ASPECTS 9   CTA head & neck No LVO, unremarkable MRI  no acute process. 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 Chronic small vessel ischemia. 2D Echo EF 60 to 65%. LDL 60 HgbA1c 12.2 on 06/30/23 VTE prophylaxis - Lovenox   No antithrombotic prior to admission, now on aspirin  81 mg daily  Therapy recommendations:  SNF Disposition: Pending  Hx of Stroke/TIA on imaging MRI showed  remote infarcts in the left temporal occipital lobes and right occipital lobe Details unclear  Hx of hypertension Positive orthostatics  Home meds:  none BP dropped to 80s with just sitting up at the edge of bed Positive orthostatics  in the hospital Long-term BP goal normotensive  Hyperlipidemia Home meds:  crestor 5mg  ,  resumed in hospital LDL 60, goal < 70 Continue statin at discharge  Seizure disorder  On home Topamax  and Depakote VPA level 57 Continue home meds  MS Resume home meds Needs close follow-up with outpatient neurology  Diabetes type II UnControlled Home meds:  ozempic, insulin , synjardy  HgbA1c 12.2 in 06/2023, goal < 7.0 CBGs SSI Recommend close follow-up with PCP for better DM control  Tobacco abuse  She smokes cigarettes daily Counsel on cessation  other Stroke Risk Factors Obesity, Body mass index is 30.13 kg/m., BMI >/= 30 associated with increased stroke risk, recommend weight loss, diet and exercise as appropriate  Migraines  Other Active Problems GERD, Crohn's disease Anxiety and depression  Hospital day # 0  Jonette Nestle DNP, ACNPC-AG  [facility]  ATTENDING NOTE: I reviewed above note and agree with the assessment and plan. Pt was seen and examined.   No family at bedside.  Patient lying bed, lethargic, stated generalized weakness, and on exam showed lack of effort in all extremities and facial expression in the bilateral decreased light touch sensation.  MRI negative for acute infarct, however she does have chronic infarcts.  Uncontrolled risk factors with elevated A1c.  Syncope episode at home likely due to orthostasis.  Continue aspirin  for stroke prevention continue low-dose statin.  Depakote level 57, continue Topamax  and Depakote home meds.  PT and OT recommend SNF.  For detailed assessment and plan, please refer to above as I have made changes wherever appropriate.   Neurology will sign off. Please call with questions.  No neuro follow-up needed at this time. Thanks for the consult.   Consuelo Denmark, MD PhD Stroke Neurology 08/09/2023 5:56 PM     To contact Stroke Continuity provider, please refer to [email or web address]. After hours, contact General  Neurology

## 2023-08-10 DIAGNOSIS — R55 Syncope and collapse: Secondary | ICD-10-CM | POA: Diagnosis not present

## 2023-08-10 LAB — BASIC METABOLIC PANEL WITH GFR
Anion gap: 9 (ref 5–15)
BUN: 14 mg/dL (ref 6–20)
CO2: 21 mmol/L — ABNORMAL LOW (ref 22–32)
Calcium: 8.8 mg/dL — ABNORMAL LOW (ref 8.9–10.3)
Chloride: 104 mmol/L (ref 98–111)
Creatinine, Ser: 1.12 mg/dL — ABNORMAL HIGH (ref 0.44–1.00)
GFR, Estimated: 59 mL/min — ABNORMAL LOW (ref >60)
Glucose, Bld: 152 mg/dL — ABNORMAL HIGH (ref 70–99)
Potassium: 3.8 mmol/L (ref 3.5–5.1)
Sodium: 134 mmol/L — ABNORMAL LOW (ref 135–145)

## 2023-08-10 LAB — GLUCOSE, CAPILLARY
Glucose-Capillary: 194 mg/dL — ABNORMAL HIGH (ref 70–99)
Glucose-Capillary: 183 mg/dL — ABNORMAL HIGH (ref 70–99)
Glucose-Capillary: 270 mg/dL — ABNORMAL HIGH (ref 70–99)
Glucose-Capillary: 219 mg/dL — ABNORMAL HIGH (ref 70–99)

## 2023-08-10 LAB — HEMOGLOBIN A1C
Hgb A1c MFr Bld: 9.6 % — ABNORMAL HIGH (ref 4.8–5.6)
Mean Plasma Glucose: 229 mg/dL

## 2023-08-10 LAB — TOPIRAMATE LEVEL: Topiramate Lvl: 8 ug/mL (ref 2.0–25.0)

## 2023-08-10 MED ORDER — BACLOFEN 10 MG PO TABS
10.0000 mg | ORAL_TABLET | Freq: Three times a day (TID) | ORAL | Status: DC
  Administered 2023-08-10 – 2023-08-13 (×10): 10 mg via ORAL
  Filled 2023-08-10 (×10): qty 1

## 2023-08-10 MED ORDER — PNEUMOCOCCAL 20-VAL CONJ VACC 0.5 ML IM SUSY
0.5000 mL | PREFILLED_SYRINGE | INTRAMUSCULAR | Status: AC
  Administered 2023-08-11: 0.5 mL via INTRAMUSCULAR
  Filled 2023-08-10: qty 0.5

## 2023-08-10 MED ORDER — POLYETHYLENE GLYCOL 3350 17 G PO PACK
17.0000 g | PACK | Freq: Every day | ORAL | Status: DC
  Administered 2023-08-10 – 2023-08-13 (×4): 17 g via ORAL
  Filled 2023-08-10 (×4): qty 1

## 2023-08-10 MED ORDER — SENNOSIDES-DOCUSATE SODIUM 8.6-50 MG PO TABS
1.0000 | ORAL_TABLET | Freq: Two times a day (BID) | ORAL | Status: DC
  Administered 2023-08-10 – 2023-08-13 (×6): 1 via ORAL
  Filled 2023-08-10 (×6): qty 1

## 2023-08-10 MED ORDER — TRESIBA FLEXTOUCH 100 UNIT/ML ~~LOC~~ SOPN: 35.0000 [IU] | PEN_INJECTOR | Freq: Every day | SUBCUTANEOUS | Status: DC | PRN

## 2023-08-10 MED ORDER — ASPIRIN 81 MG PO TBEC: 81.0000 mg | DELAYED_RELEASE_TABLET | Freq: Every day | ORAL | Status: AC

## 2023-08-10 MED ORDER — DICYCLOMINE HCL 10 MG PO CAPS: 10.0000 mg | ORAL_CAPSULE | Freq: Three times a day (TID) | ORAL | Status: DC | PRN

## 2023-08-10 NOTE — Evaluation (Signed)
 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   Influenza 04/16/2016   HCAP (healthcare-associated pneumonia) 04/16/2016   Hypotension 04/16/2016   Acute respiratory failure (HCC) 04/16/2016   Sepsis (HCC) 04/15/2016   Pyelonephritis 03/29/2016   Chronic pain syndrome 03/29/2016   Hypochloremia    Hyponatremia    Unspecified osteoarthritis, unspecified site 06/20/2015   Diabetes mellitus due to underlying condition without complications (HCC) 10/05/2013   Anemia, unspecified 10/05/2013   Hypokalemia 10/05/2013   Smoking 10/05/2013   Menorrhagia with regular cycle 10/05/2013   Gastroesophageal reflux disease 10/05/2013   Chest pain 04/17/2013   Seizure (HCC) 02/24/2013   Atypical chest pain 09/22/2012   Multiple sclerosis (HCC) 09/22/2012   Noncompliance 09/22/2012   Tobacco abuse 09/22/2012   DM2 (diabetes mellitus, type 2) (HCC) 09/22/2012   Protein-calorie malnutrition, severe (HCC) 09/22/2012   Past Medical History:  Past Medical History:  Diagnosis Date   Anemia    Anxiety    Arthritis    "joints" (04/17/2013)   Chronic back pain    "all over" (04/17/2013)   Crohn disease (HCC)    Daily headache    Depression    Diabetes type 2, uncontrolled    Epileptic (HCC)    "had 1-2/night; started RX; last one was 01/2014"   GERD (gastroesophageal reflux disease)    Migraine    "q day" (04/17/2013)   MS (multiple sclerosis) (HCC)    Pneumonia    "about 10 times  in my lifetime" (04/17/2013)   Past Surgical History:  Past Surgical History:  Procedure Laterality Date   CARDIAC CATHETERIZATION N/Andrea 10/08/2015   Procedure: Left Heart Cath and Coronary Angiography;  Surgeon: Peter M [place], MD;  Location: [facility] INVASIVE CV LAB;  Service: Cardiovascular;  Laterality: N/Andrea;   CHOLECYSTECTOMY  1994   TUBAL LIGATION  1993   HPI:  Andrea Wade is Andrea chronically ill 52/F with history of multiple sclerosis, seizure disorder on Depakote and Topamax , headaches, chronic migraines, chronic pain, narcotic dependence, type 2 diabetes mellitus, arthritis, chronic back pain was brought to the ED as Andrea code stroke for left-sided weakness. CT head limited by motion artifact, cannot rule out abnormality in anterior left temporal lobe concerning for acute infarct.  MRI revealed no acute intracranial abnormality.   Assessment / Plan / Recommendation Clinical Impression  Patient presents with cognitive deficits in the areas of attention, memory, emergent awareness, and executive functioning secondary primarily to MS and seizure disorder. Patient notes that these changes have been gradually occuring over the past 1-2 years but does state that they have become acutely magnified by current medical status and reason for admission. Patient demonstrated aforementioned deficits during functional interview and performance on SLUMS examination (score 19/30). Patient also endorses changes to speech and swallowing intermittently but notes these most often occur during MS flare ups. No motor speech nor expressive/receptive language deficits noted this sesssion. Patient would benefit from continued SLP services to target aforementioned deficits during admission and would further benefit from home health SLP upon discharge.  SLP Assessment  SLP Recommendation/Assessment: Patient needs continued Speech Language Pathology Services SLP Visit Diagnosis: Cognitive communication deficit (R41.841)      Assistance Recommended at Discharge  Intermittent Supervision/Assistance  Functional Status Assessment Patient has had Andrea recent decline in their functional status and demonstrates the ability to make significant improvements in function in Andrea reasonable and predictable amount of time.  Frequency and Duration min 2x/week  2 weeks      SLP Evaluation Cognition  Overall Cognitive Status: Impaired/Different from baseline Arousal/Alertness: Awake/alert Orientation Level: Oriented X4 Year: 2025 Month: June Day of Week: Correct Attention: Sustained;Selective Sustained Attention: Impaired Sustained Attention Impairment: Verbal complex;Functional complex Selective Attention: Impaired Selective Attention Impairment: Verbal complex;Functional complex Memory: Impaired Memory Impairment: Storage deficit;Retrieval deficit Awareness: Impaired Awareness Impairment: Emergent impairment Problem Solving: Impaired Problem Solving Impairment: Verbal complex;Functional complex Executive Function: Organizing;Sequencing;Self Monitoring;Self Correcting Sequencing: Impaired Sequencing Impairment: Verbal complex;Functional complex Organizing: Impaired Organizing Impairment: Verbal complex;Functional complex Self Monitoring: Impaired Self Monitoring Impairment: Verbal complex;Functional complex Self Correcting: Impaired Self Correcting Impairment: Verbal complex;Functional complex Safety/Judgment: Appears intact       Comprehension  Auditory Comprehension Overall Auditory Comprehension: Appears within functional limits for tasks assessed    Expression Expression Primary Mode of Expression: Verbal Verbal Expression Overall Verbal Expression: Appears within functional limits for tasks assessed   Oral / Motor  Oral Motor/Sensory Function Overall Oral Motor/Sensory Function: Within functional limits Motor Speech Overall Motor Speech: Appears within functional limits for tasks assessed   Andrea Wade, M.Andrea., CCC-SLP          Andrea Wade Andrea Wade 08/10/2023, 12:52 PM

## 2023-08-10 NOTE — TOC Initial Note (Signed)
 Transition of Care [facility]) - Initial/Assessment Note    Patient Details  Name: Andrea Wade MRN: [id number] Date of Birth: October 24, 1970  Transition of Care [facility]) CM/SW Contact:    Tandy Fam, LCSW Phone Number: 08/10/2023, 11:26 AM  Clinical Narrative:      CSW met with patient to discuss recommendation for SNF. Patient in agreement, says she has been to rehab before but has no preference on facility. CSW completed referral and faxed out, will provide bed offers. CSW to follow.             Expected Discharge Plan: Skilled Nursing Facility Barriers to Discharge: Continued Medical Work up, English as a second language teacher   Patient Goals and CMS Choice Patient states their goals for this hospitalization and ongoing recovery are:: to get better CMS Medicare.gov Compare Post Acute Care list provided to:: Patient Choice offered to / list presented to : Patient [place] ownership interest in [facility].provided to:: Patient    Expected Discharge Plan and Services     Post Acute Care Choice: Skilled Nursing Facility Living arrangements for the past 2 months: Single Family Home                                      Prior Living Arrangements/Services Living arrangements for the past 2 months: Single Family Home Lives with:: Significant Other Patient language and need for interpreter reviewed:: No Do you feel safe going back to the place where you live?: Yes      Need for Family Participation in Patient Care: No (Comment) Care giver support system in place?: No (comment)   Criminal Activity/Legal Involvement Pertinent to Current Situation/Hospitalization: No - Comment as needed  Activities of Daily Living   ADL Screening (condition at time of admission) Independently performs ADLs?: No Does the patient have a NEW difficulty with bathing/dressing/toileting/self-feeding that is expected to last >3 days?: Yes (Initiates electronic notice to provider for possible  OT consult) Does the patient have a NEW difficulty with getting in/out of bed, walking, or climbing stairs that is expected to last >3 days?: Yes (Initiates electronic notice to provider for possible PT consult) Does the patient have a NEW difficulty with communication that is expected to last >3 days?: No Is the patient deaf or have difficulty hearing?: No Does the patient have difficulty seeing, even when wearing glasses/contacts?: No Does the patient have difficulty concentrating, remembering, or making decisions?: No  Permission Sought/Granted Permission sought to share information with : Facility Industrial/product designer granted to share information with : Yes, Verbal Permission Granted     Permission granted to share info w AGENCY: SNF        Emotional Assessment Appearance:: Appears stated age Attitude/Demeanor/Rapport: Engaged Affect (typically observed): Appropriate Orientation: : Oriented to Self, Oriented to Place, Oriented to  Time, Oriented to Situation Alcohol / Substance Use: Not Applicable Psych Involvement: No (comment)  Admission diagnosis:  CVA (cerebral vascular accident) (HCC) [I63.9] Acute CVA (cerebrovascular accident) (HCC) [I63.9] Cerebral infarction, unspecified mechanism (HCC) [I63.9] Patient Active Problem List   Diagnosis Date Noted   Syncope, vasovagal 08/09/2023   Breast tenderness in female 11/06/2021   Dizziness 09/02/2020   Group A streptococcal infection 09/01/2020   Diarrhea 09/01/2020   Leukocytosis 09/01/2020   Rheumatoid arthritis 09/01/2020   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   Influenza 04/16/2016   HCAP (healthcare-associated pneumonia) 04/16/2016  Hypotension 04/16/2016   Acute respiratory failure (HCC) 04/16/2016   Sepsis (HCC) 04/15/2016   Pyelonephritis 03/29/2016   Chronic pain syndrome 03/29/2016   Hypochloremia    Hyponatremia    Unspecified osteoarthritis, unspecified site  06/20/2015   Diabetes mellitus due to underlying condition without complications (HCC) 10/05/2013   Anemia, unspecified 10/05/2013   Hypokalemia 10/05/2013   Smoking 10/05/2013   Menorrhagia with regular cycle 10/05/2013   Gastroesophageal reflux disease 10/05/2013   Chest pain 04/17/2013   Seizure (HCC) 02/24/2013   Atypical chest pain 09/22/2012   Multiple sclerosis (HCC) 09/22/2012   Noncompliance 09/22/2012   Tobacco abuse 09/22/2012   DM2 (diabetes mellitus, type 2) (HCC) 09/22/2012   Protein-calorie malnutrition, severe (HCC) 09/22/2012   PCP:  O'Buch, Greta, PA-C Pharmacy:   [facility] [place] - [address] [place] [address] Phone: [phone] Fax: [phone]  [id number] - [place], [place] - [address] [facility] [place] [address] Phone: [phone] Fax: [phone]     Social Drivers of Health (SDOH) Social History: SDOH Screenings   Food Insecurity: No Food Insecurity (08/09/2023)  Housing: Low Risk  (08/09/2023)  Transportation Needs: No Transportation Needs (08/09/2023)  Utilities: Not At Risk (08/09/2023)  Tobacco Use: Medium Risk (08/08/2023)   SDOH Interventions:     Readmission Risk Interventions     No data to display

## 2023-08-10 NOTE — Plan of Care (Signed)

## 2023-08-10 NOTE — Plan of Care (Signed)
 Problem: Education: Goal: Knowledge of disease or condition will improve Outcome: Adequate for Discharge Goal: Knowledge of secondary prevention will improve (MUST DOCUMENT ALL) Outcome: Adequate for Discharge Goal: Knowledge of patient specific risk factors will improve (DELETE if not current risk factor) Outcome: Adequate for Discharge   Problem: Ischemic Stroke/TIA Tissue Perfusion: Goal: Complications of ischemic stroke/TIA will be minimized Outcome: Adequate for Discharge   Problem: Coping: Goal: Will verbalize positive feelings about self Outcome: Adequate for Discharge Goal: Will identify appropriate support needs Outcome: Adequate for Discharge   Problem: Health Behavior/Discharge Planning: Goal: Ability to manage health-related needs will improve Outcome: Adequate for Discharge Goal: Goals will be collaboratively established with patient/family Outcome: Adequate for Discharge   Problem: Self-Care: Goal: Ability to participate in self-care as condition permits will improve Outcome: Adequate for Discharge Goal: Verbalization of feelings and concerns over difficulty with self-care will improve Outcome: Adequate for Discharge Goal: Ability to communicate needs accurately will improve Outcome: Adequate for Discharge   Problem: Nutrition: Goal: Risk of aspiration will decrease Outcome: Adequate for Discharge Goal: Dietary intake will improve Outcome: Adequate for Discharge   Problem: Education: Goal: Ability to describe self-care measures that may prevent or decrease complications (Diabetes Survival Skills Education) will improve Outcome: Adequate for Discharge Goal: Individualized Educational Video(s) Outcome: Adequate for Discharge   Problem: Coping: Goal: Ability to adjust to condition or change in health will improve Outcome: Adequate for Discharge   Problem: Fluid Volume: Goal: Ability to maintain a balanced intake and output will improve Outcome: Adequate  for Discharge   Problem: Health Behavior/Discharge Planning: Goal: Ability to identify and utilize available resources and services will improve Outcome: Adequate for Discharge Goal: Ability to manage health-related needs will improve Outcome: Adequate for Discharge   Problem: Metabolic: Goal: Ability to maintain appropriate glucose levels will improve Outcome: Adequate for Discharge   Problem: Nutritional: Goal: Maintenance of adequate nutrition will improve Outcome: Adequate for Discharge Goal: Progress toward achieving an optimal weight will improve Outcome: Adequate for Discharge   Problem: Skin Integrity: Goal: Risk for impaired skin integrity will decrease Outcome: Adequate for Discharge   Problem: Tissue Perfusion: Goal: Adequacy of tissue perfusion will improve Outcome: Adequate for Discharge   Problem: Education: Goal: Knowledge of General Education information will improve Description: Including pain rating scale, medication(s)/side effects and non-pharmacologic comfort measures Outcome: Adequate for Discharge   Problem: Health Behavior/Discharge Planning: Goal: Ability to manage health-related needs will improve Outcome: Adequate for Discharge   Problem: Clinical Measurements: Goal: Ability to maintain clinical measurements within normal limits will improve Outcome: Adequate for Discharge Goal: Will remain free from infection Outcome: Adequate for Discharge Goal: Diagnostic test results will improve Outcome: Adequate for Discharge Goal: Respiratory complications will improve Outcome: Adequate for Discharge Goal: Cardiovascular complication will be avoided Outcome: Adequate for Discharge   Problem: Activity: Goal: Risk for activity intolerance will decrease Outcome: Adequate for Discharge   Problem: Nutrition: Goal: Adequate nutrition will be maintained Outcome: Adequate for Discharge   Problem: Coping: Goal: Level of anxiety will decrease Outcome:  Adequate for Discharge   Problem: Elimination: Goal: Will not experience complications related to bowel motility Outcome: Adequate for Discharge Goal: Will not experience complications related to urinary retention Outcome: Adequate for Discharge   Problem: Pain Managment: Goal: General experience of comfort will improve and/or be controlled Outcome: Adequate for Discharge   Problem: Safety: Goal: Ability to remain free from injury will improve Outcome: Adequate for Discharge   Problem: Skin Integrity: Goal: Risk for  impaired skin integrity will decrease Outcome: Adequate for Discharge

## 2023-08-10 NOTE — NC FL2 (Signed)
 [place]  MEDICAID FL2 LEVEL OF CARE FORM     IDENTIFICATION  Patient Name: Andrea Wade Birthdate: Jan 28, 1971 Sex: female Admission Date (Current Location): 08/08/2023  [facility] and [place] Number:  [employer] and Address:  [facility], [address], [place], [place] [address]      Provider Number: [id number]  Attending Physician Name and Address:  Deforest Fast, MD  Relative Name and Phone Number:       Current Level of Care: Hospital Recommended Level of Care: Skilled Nursing Facility Prior Approval Number:    Date Approved/Denied:   PASRR Number: [id number]  Discharge Plan: SNF    Current Diagnoses: Patient Active Problem List   Diagnosis Date Noted   Syncope, vasovagal 08/09/2023   Breast tenderness in female 11/06/2021   Dizziness 09/02/2020   Group A streptococcal infection 09/01/2020   Diarrhea 09/01/2020   Leukocytosis 09/01/2020   Rheumatoid arthritis 09/01/2020   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   Influenza 04/16/2016   HCAP (healthcare-associated pneumonia) 04/16/2016   Hypotension 04/16/2016   Acute respiratory failure (HCC) 04/16/2016   Sepsis (HCC) 04/15/2016   Pyelonephritis 03/29/2016   Chronic pain syndrome 03/29/2016   Hypochloremia    Hyponatremia    Unspecified osteoarthritis, unspecified site 06/20/2015   Diabetes mellitus due to underlying condition without complications (HCC) 10/05/2013   Anemia, unspecified 10/05/2013   Hypokalemia 10/05/2013   Smoking 10/05/2013   Menorrhagia with regular cycle 10/05/2013   Gastroesophageal reflux disease 10/05/2013   Chest pain 04/17/2013   Seizure (HCC) 02/24/2013   Atypical chest pain 09/22/2012   Multiple sclerosis (HCC) 09/22/2012   Noncompliance 09/22/2012   Tobacco abuse 09/22/2012   DM2 (diabetes mellitus, type 2) (HCC) 09/22/2012   Protein-calorie malnutrition, severe (HCC) 09/22/2012    Orientation  RESPIRATION BLADDER Height & Weight     Self, Time, Situation, Place  Normal Incontinent Weight: 204 lb (92.5 kg) Height:  5\' 9"  (175.3 cm)  BEHAVIORAL SYMPTOMS/MOOD NEUROLOGICAL BOWEL NUTRITION STATUS    Convulsions/Seizures Continent Diet (carb modified)  AMBULATORY STATUS COMMUNICATION OF NEEDS Skin   Extensive Assist Verbally Normal                       Personal Care Assistance Level of Assistance  Bathing, Feeding, Dressing Bathing Assistance: Maximum assistance Feeding assistance: Limited assistance Dressing Assistance: Maximum assistance     Functional Limitations Info             SPECIAL CARE FACTORS FREQUENCY  PT (By licensed PT), OT (By licensed OT)     PT Frequency: 5x/wk OT Frequency: 5x/wk            Contractures Contractures Info: Not present    Additional Factors Info  Code Status, Allergies, Psychotropic, Insulin  Sliding Scale Code Status Info: Full Allergies Info: Codeine, Peanut-containing Drug Products, Aspirin , Corn-containing Products, Pecan Extract, Penicillins, Shellfish-derived Products, Tape Psychotropic Info: Wellbutrin  XL 150mg  every morning, Cymbalta  60mg  daily Insulin  Sliding Scale Info: see DC summary       Current Medications (08/10/2023):  This is the current hospital active medication list Current Facility-Administered Medications  Medication Dose Route Frequency Provider Last Rate Last Admin   acetaminophen  (TYLENOL ) tablet 650 mg  650 mg Oral Q6H PRN Joseph, Preetha, MD   650 mg at 08/09/23 2131   Or   acetaminophen  (TYLENOL ) suppository 650 mg  650 mg Rectal Q6H PRN Deforest Fast, MD  aspirin  EC tablet 81 mg  81 mg Oral Daily Consuelo Denmark, MD   81 mg at 08/10/23 1024   baclofen  (LIORESAL ) tablet 10 mg  10 mg Oral TID Joseph, Preetha, MD   10 mg at 08/10/23 1025   buPROPion  (WELLBUTRIN  XL) 24 hr tablet 150 mg  150 mg Oral q morning Joseph, Preetha, MD   150 mg at 08/10/23 1024   dicyclomine  (BENTYL ) capsule 10 mg  10  mg Oral TID PRN Joseph, Preetha, MD       divalproex (DEPAKOTE ER) 24 hr tablet 500 mg  500 mg Oral QHS Joseph, Preetha, MD   500 mg at 08/09/23 2131   DULoxetine  (CYMBALTA ) DR capsule 60 mg  60 mg Oral Daily Joseph, Preetha, MD   60 mg at 08/10/23 1024   fentaNYL  (DURAGESIC ) 25 MCG/HR 1 patch  1 patch Transdermal Q72H Deforest Fast, MD   1 patch at 08/09/23 0341   folic acid  (FOLVITE ) tablet 1 mg  1 mg Oral Daily Joseph, Preetha, MD   1 mg at 08/10/23 1025   insulin  aspart (novoLOG ) injection 0-9 Units  0-9 Units Subcutaneous TID [facility] Joseph, Preetha, MD   2 Units at 08/10/23 0644   insulin  glargine (LANTUS ) injection 35 Units  35 Units Subcutaneous QHS Joseph, Preetha, MD   35 Units at 08/09/23 2130   nortriptyline  (PAMELOR ) capsule 75 mg  75 mg Oral QHS Joseph, Preetha, MD   75 mg at 08/09/23 2131   ondansetron  (ZOFRAN ) tablet 4 mg  4 mg Oral Q6H PRN Deforest Fast, MD       Or   ondansetron  (ZOFRAN ) injection 4 mg  4 mg Intravenous Q6H PRN Joseph, Preetha, MD       oxyCODONE  (Oxy IR/ROXICODONE ) immediate release tablet 5 mg  5 mg Oral Q6H PRN Joseph, Preetha, MD   5 mg at 08/09/23 2131   pantoprazole  (PROTONIX ) EC tablet 40 mg  40 mg Oral Daily Deforest Fast, MD   40 mg at 08/10/23 1025   [START ON 08/11/2023] pneumococcal 20-valent conjugate vaccine (PREVNAR 20) injection 0.5 mL  0.5 mL Intramuscular Tomorrow-1000 Joseph, Preetha, MD       rosuvastatin (CRESTOR) tablet 5 mg  5 mg Oral Daily Joseph, Preetha, MD   5 mg at 08/10/23 1024   topiramate  (TOPAMAX ) tablet 200 mg  200 mg Oral BID Joseph, Preetha, MD   200 mg at 08/10/23 1025     Discharge Medications: Please see discharge summary for a list of discharge medications.  Relevant Imaging Results:  Relevant Lab Results:   Additional Information SS#: [id number]  Tandy Fam, LCSW

## 2023-08-10 NOTE — Discharge Summary (Signed)
 Physician Discharge Summary  Andrea Wade ZOX:[id number] DOB: 03-22-1970 DOA: 08/08/2023  PCP: Clydene Darner, PA-C  Admit date: 08/08/2023 Discharge date: 08/10/2023  Time spent: 45 minutes  Recommendations for Outpatient Follow-up:  SNF for short term rehab Follow-up with primary neurologist in [facility]   Discharge Diagnoses:  Principal Problem:   Syncope, orthostatic Active Problems:   Multiple sclerosis (HCC)   Tobacco abuse   DM2 (diabetes mellitus, type 2) (HCC)   Protein-calorie malnutrition, severe (HCC)   Seizure (HCC)   Chronic pain syndrome Narcotic dependence Mild thrombocytopenia   Discharge Condition: Stable  Diet recommendation: Low-salt, diabetic  Filed Weights   08/08/23 1459 08/08/23 1518  Weight: 92.2 kg 92.5 kg    History of present illness:  52/F with history of multiple sclerosis, seizure disorder on Depakote and Topamax , headaches, chronic migraines, chronic pain, narcotic dependence, type 2 diabetes mellitus, arthritis, chronic back pain was brought to the ED as a code stroke for left-sided weakness.  Patient reports that she usually uses rollator or wheelchair to ambulate, she tried to walk to the bathroom holding the sides and next thing she remembers is being down on the floor, recalling hitting her head, back, knee etc. -Subsequent to this reports having increased pain and weakness in both legs especially the right, also reports pain in her mid back, EMS was called and brought to the ED -Vital signs were stable, CBG 140, BMP largely unremarkable except low magnesium and low platelets, CT head limited by motion artifact, cannot rule out abnormality in anterior left temporal lobe concerning for acute infarct.  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, DM, add compression stockings, stopped lisinopril  -Do not suspect seizures, Depakote level is 57 - PT OT eval completed, SNF recommended, plan for short-term rehab -Follow-up with neurologist in [place]     Multiple sclerosis (HCC) Chronic pain -Resume home regimen of antispasmodics and oxycodone  and fentanyl  patch -Imaging of knee and thoracic spine without fractures -Needs close follow-up with her neurologist in [place]   Hypertension -Lisinopril  discontinued     Tobacco abuse     DM2 (diabetes mellitus, type 2) (HCC) - Glargine dose increased to 35 units     Protein-calorie malnutrition, severe (HCC)     H/o  Seizures (HCC) - Continue home dose of Topamax  and Depakote, Depakote level was 57   Mild thrombocytopenia - Likely secondary to Depakote, monitor  Consultations: Neuro  Discharge Exam: Vitals:   08/10/23 0749 08/10/23 1143  BP: 98/74 114/76  Pulse: 95 91  Resp: 16 16  Temp: 98.1 F (36.7 C) 98.8 F (37.1 C)  SpO2: 95% 92%   General exam: Appears calm and comfortable, AAO x 3 Respiratory system: Clear to auscultation Cardiovascular system: S1 & S2 heard, RRR.  Abd: nondistended, soft and nontender.Normal bowel sounds heard. Central nervous system: Moderate bilateral lower extremity weakness, and some part limited by pain as well Extremities: no edema Skin: No rashes Psychiatry:  Mood & affect appropriate.   Discharge Instructions  Allergies as of 08/10/2023       Reactions   Codeine Hives, Itching, Palpitations   Peanut-containing Drug Products Anaphylaxis, Itching   Aspirin  Itching   High doses only-baby is ok   Corn-containing  Products Rash   Pecan Extract Itching   Penicillins Hives, Itching, Nausea And Vomiting   Allergy from childhood Has patient had a PCN reaction causing immediate rash, facial/tongue/throat swelling, SOB or lightheadedness with hypotension: Yes Has patient had a PCN reaction causing severe rash involving mucus membranes or skin necrosis: Unk Has patient had a PCN reaction that required hospitalization: Unk Has patient had a PCN reaction occurring within the last 10 years: No If all of the above answers are "NO", then may proceed with Cephalosporin use.   Shellfish-derived Products Rash, Swelling   Tape Other (See Comments)   Slight irritation Other reaction(s): Other (See Comments) Slight irritation        Medication List     STOP taking these medications    Baqsimi One Pack 3 MG/DOSE Powd Generic drug: Glucagon   buPROPion  150 MG 24 hr tablet Commonly known as: WELLBUTRIN  XL   EPINEPHrine  0.3 mg/0.3 mL Soaj injection Commonly known as: EPI-PEN   Ozempic (0.25 or 0.5 MG/DOSE) 2 MG/3ML Sopn Generic drug: Semaglutide(0.25 or 0.5MG /DOS)       TAKE these medications    aspirin  EC 81 MG tablet Take 1 tablet (81 mg total) by mouth daily. Swallow whole. Start taking on: August 11, 2023   baclofen  20 MG tablet Commonly known as: LIORESAL  Take 20 mg by mouth 3 (three) times daily.   dicyclomine  10 MG capsule Commonly known as: BENTYL  Take 10 mg by mouth daily as needed for spasms.   divalproex 500 MG 24 hr tablet Commonly known as: DEPAKOTE ER Take 500 mg by mouth at bedtime.   DULoxetine  60 MG capsule Commonly known as: CYMBALTA  Take 120 mg by mouth daily.   esomeprazole 40 MG capsule Commonly known as: NEXIUM Take 40 mg by mouth daily.   fentaNYL  25 MCG/HR Commonly known as: DURAGESIC  1 patch every 3 (three) days.   folic acid  1 MG tablet Commonly known as: FOLVITE  Take 1 mg by mouth daily.   nortriptyline  75 MG capsule Commonly known as: PAMELOR  Take  75 mg by mouth at bedtime.   ondansetron  8 MG disintegrating tablet Commonly known as: ZOFRAN -ODT Take 8 mg by mouth 3 (three) times daily as needed for nausea.   oxyCODONE  5 MG immediate release tablet Commonly known as: Oxy IR/ROXICODONE  Take 5 mg by mouth every 6 (six) hours as needed for moderate pain (pain score 4-6).   rosuvastatin 5 MG tablet Commonly known as: CRESTOR Take 1 tablet by mouth daily.   Synjardy XR 11-998 MG Tb24 Generic drug: Empagliflozin-metFORMIN  HCl ER Take 1 tablet by mouth daily.   topiramate  200 MG tablet Commonly known as: TOPAMAX  Take 200 mg by mouth 2 (two) times daily.   Tresiba FlexTouch 100 UNIT/ML FlexTouch Pen Generic drug: insulin  degludec Inject 35 Units into the skin daily as needed (for BG or >200). What changed: how much to take       Allergies  Allergen Reactions   Codeine Hives, Itching and Palpitations   Peanut-Containing Drug Products Anaphylaxis and Itching   Aspirin  Itching    High doses only-baby is ok   Corn-Containing Products Rash   Pecan Extract Itching   Penicillins Hives, Itching and Nausea And Vomiting    Allergy from childhood Has patient had a  PCN reaction causing immediate rash, facial/tongue/throat swelling, SOB or lightheadedness with hypotension: Yes Has patient had a PCN reaction causing severe rash involving mucus membranes or skin necrosis: Unk Has patient had a PCN reaction that required hospitalization: Unk Has patient had a PCN reaction occurring within the last 10 years: No If all of the above answers are "NO", then may proceed with Cephalosporin use.     Shellfish-Derived Products Rash and Swelling   Tape Other (See Comments)    Slight irritation Other reaction(s): Other (See Comments) Slight irritation      The results of significant diagnostics from this hospitalization (including imaging, microbiology, ancillary and laboratory) are listed below for reference.    Significant Diagnostic  Studies: ECHOCARDIOGRAM COMPLETE Result Date: 08/09/2023    ECHOCARDIOGRAM REPORT   Patient Name:   Andrea Wade Date of Exam: 08/09/2023 Medical Rec #:  [id number]           Height:       69.0 in Accession #:    [id number]          Weight:       204.0 lb Date of Birth:  16-Aug-1970            BSA:          2.083 m Patient Age:    52 years            BP:           144/90 mmHg Patient Gender: F                   HR:           77 bpm. Exam Location:  Inpatient Procedure: 2D Echo, Color Doppler and Cardiac Doppler (Both Spectral and Color            Flow Doppler were utilized during procedure). Indications:    Stroke I63.9  History:        Patient has prior history of Echocardiogram examinations, most                 recent 09/03/2020.  Sonographer:    Hersey Lorenzo RDCS Referring Phys: [phone] Vincenza Dail IMPRESSIONS  1. Left ventricular ejection fraction, by estimation, is 60 to 65%. The left ventricle has normal function. The left ventricle has no regional wall motion abnormalities. Left ventricular diastolic parameters are indeterminate.  2. Right ventricular systolic function is normal. The right ventricular size is normal.  3. The mitral valve is normal in structure. No evidence of mitral valve regurgitation. No evidence of mitral stenosis.  4. The aortic valve is normal in structure. Aortic valve regurgitation is not visualized. No aortic stenosis is present.  5. The inferior vena cava is normal in size with greater than 50% respiratory variability, suggesting right atrial pressure of 3 mmHg. FINDINGS  Left Ventricle: Left ventricular ejection fraction, by estimation, is 60 to 65%. The left ventricle has normal function. The left ventricle has no regional wall motion abnormalities. The left ventricular internal cavity size was normal in size. There is  no left ventricular hypertrophy. Left ventricular diastolic parameters are indeterminate. Right Ventricle: The right ventricular size is normal. No increase in  right ventricular wall thickness. Right ventricular systolic function is normal. Left Atrium: Left atrial size was normal in size. Right Atrium: Right atrial size was normal in size. Pericardium: There is no evidence of pericardial effusion. Mitral Valve: The mitral valve is normal in structure. No evidence of mitral valve  regurgitation. No evidence of mitral valve stenosis. Tricuspid Valve: The tricuspid valve is normal in structure. Tricuspid valve regurgitation is not demonstrated. No evidence of tricuspid stenosis. Aortic Valve: The aortic valve is normal in structure. Aortic valve regurgitation is not visualized. No aortic stenosis is present. Pulmonic Valve: The pulmonic valve was normal in structure. Pulmonic valve regurgitation is not visualized. No evidence of pulmonic stenosis. Aorta: The aortic root is normal in size and structure. Venous: The inferior vena cava is normal in size with greater than 50% respiratory variability, suggesting right atrial pressure of 3 mmHg. IAS/Shunts: No atrial level shunt detected by color flow Doppler.  LEFT VENTRICLE PLAX 2D LVIDd:         5.00 cm   Diastology LVIDs:         3.70 cm   LV e' medial:    4.90 cm/s LV PW:         1.00 cm   LV E/e' medial:  12.3 LV IVS:        1.00 cm   LV e' lateral:   7.18 cm/s LVOT diam:     2.10 cm   LV E/e' lateral: 8.4 LV SV:         47 LV SV Index:   23 LVOT Area:     3.46 cm  RIGHT VENTRICLE            IVC RV S prime:     9.36 cm/s  IVC diam: 1.30 cm TAPSE (M-mode): 1.8 cm LEFT ATRIUM             Index        RIGHT ATRIUM           Index LA diam:        3.30 cm 1.58 cm/m   RA Area:     10.40 cm LA Vol (A2C):   23.3 ml 11.18 ml/m  RA Volume:   22.50 ml  10.80 ml/m LA Vol (A4C):   19.3 ml 9.26 ml/m LA Biplane Vol: 21.8 ml 10.46 ml/m  AORTIC VALVE LVOT Vmax:   75.80 cm/s LVOT Vmean:  52.300 cm/s LVOT VTI:    0.137 m  AORTA Ao Root diam: 3.00 cm Ao Asc diam:  3.40 cm MITRAL VALVE MV Area (PHT): 3.65 cm    SHUNTS MV Decel Time: 208  msec    Systemic VTI:  0.14 m MV E velocity: 60.30 cm/s  Systemic Diam: 2.10 cm MV A velocity: 84.90 cm/s MV E/A ratio:  0.71 Arta Lark Electronically signed by Arta Lark Signature Date/Time: 08/09/2023/1:57:38 PM    Final    CT THORACIC SPINE WO CONTRAST Result Date: 08/09/2023 CLINICAL DATA:  Mid back pain EXAM: CT THORACIC SPINE WITHOUT CONTRAST TECHNIQUE: Multidetector CT images of the thoracic were obtained using the standard protocol without intravenous contrast. RADIATION DOSE REDUCTION: This exam was performed according to the departmental dose-optimization program which includes automated exposure control, adjustment of the mA and/or kV according to patient size and/or use of iterative reconstruction technique. COMPARISON:  None Available. FINDINGS: Alignment: Normal. Vertebrae: Mild chronic height loss at T2 Paraspinal and other soft tissues: Negative. Disc levels: No spinal canal stenosis IMPRESSION: 1. No acute fracture or static subluxation of the thoracic spine. 2. Mild chronic height loss at T2. Electronically Signed   By: Juanetta Nordmann M.D.   On: 08/09/2023 04:06   DG Knee 1-2 Views Right Result Date: 08/09/2023 CLINICAL DATA:  Right knee pain EXAM: RIGHT KNEE - 2  VIEW COMPARISON:  12/16/2022 FINDINGS: Tricompartmental degenerative changes are noted. No joint effusion is seen. No acute fracture or dislocation is noted. IMPRESSION: Degenerative change without acute abnormality. Electronically Signed   By: Violeta Grey M.D.   On: 08/09/2023 03:18   MR BRAIN WO CONTRAST Result Date: 08/08/2023 CLINICAL DATA:  Acute neurologic deficit EXAM: MRI HEAD WITHOUT CONTRAST TECHNIQUE: Multiplanar, multiecho pulse sequences of the brain and surrounding structures were obtained without intravenous contrast. COMPARISON:  Brain MRI 11/30/2022 FINDINGS: Brain: No acute infarct, mass effect or extra-axial collection. Hemosiderin deposition in the posterior left hemisphere at the site of old infarct.  Old right occipital infarct. There is multifocal hyperintense T2-weighted signal within the periventricular and deep white matter. The midline structures are normal. Vascular: Normal flow voids. Skull and upper cervical spine: Normal calvarium and skull base. Visualized upper cervical spine and soft tissues are normal. Sinuses/Orbits:No paranasal sinus fluid levels or advanced mucosal thickening. No mastoid or middle ear effusion. Normal orbits. IMPRESSION: 1. No acute intracranial abnormality. 2. 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 3. Chronic small vessel ischemia. Electronically Signed   By: Juanetta Nordmann M.D.   On: 08/08/2023 20:43   CT ANGIO HEAD NECK W WO CM Result Date: 08/08/2023 CLINICAL DATA:  Stroke/TIA, determine embolic source. EXAM: CT ANGIOGRAPHY HEAD AND NECK WITH AND WITHOUT CONTRAST TECHNIQUE: Multidetector CT imaging of the head and neck was performed using the standard protocol during bolus administration of intravenous contrast. Multiplanar CT image reconstructions and MIPs were obtained to evaluate the vascular anatomy. Carotid stenosis measurements (when applicable) are obtained utilizing NASCET criteria, using the distal internal carotid diameter as the denominator. RADIATION DOSE REDUCTION: This exam was performed according to the departmental dose-optimization program which includes automated exposure control, adjustment of the mA and/or kV according to patient size and/or use of iterative reconstruction technique. CONTRAST:  75mL OMNIPAQUE  IOHEXOL  350 MG/ML SOLN COMPARISON:  Same day CT head.  CTA head and neck 11/30/2022. FINDINGS: CTA NECK FINDINGS Aortic arch: Standard configuration of the aortic arch. Imaged portion shows no evidence of aneurysm or dissection. No significant stenosis of the major arch vessel origins. Pulmonary arteries: As permitted by contrast timing, there are no filling defects in the visualized pulmonary arteries. Subclavian arteries: The subclavian  arteries are patent bilaterally. Right carotid system: No evidence of dissection, stenosis (50% or greater), or occlusion. Left carotid system: No evidence of dissection, stenosis (50% or greater), or occlusion. Minimal atherosclerosis at the carotid bifurcation. Vertebral arteries: Left vertebral artery is dominant. No evidence of dissection, stenosis (50% or greater), or occlusion. Skeleton: No acute or aggressive finding noted. Similar mild irregularity of the T1 and T2 superior endplates. Edentulous maxilla and mandible. Other neck: The visualized airway is patent. No cervical lymphadenopathy by imaging size criteria. Similar subcentimeter level 2A cervical nodes. Subcentimeter right thyroid  nodule, no follow-up required. Upper chest: Visualized lung apices are clear. Review of the MIP images confirms the above findings CTA HEAD FINDINGS ANTERIOR CIRCULATION: The intracranial ICAs are patent bilaterally. No significant stenosis, proximal occlusion, aneurysm, or vascular malformation. MCAs: The middle cerebral arteries are patent bilaterally. ACAs: The anterior cerebral arteries are patent bilaterally. POSTERIOR CIRCULATION: No significant stenosis, proximal occlusion, aneurysm, or vascular malformation. PCAs: The posterior cerebral arteries are patent bilaterally. Pcomm: Not well visualized. SCAs: The superior cerebellar arteries are patent bilaterally. Basilar artery: Patent AICAs: Patent PICAs: Patent Vertebral arteries: The intracranial vertebral arteries are patent. Venous sinuses: As permitted by contrast timing, patent. Anatomic variants: None  Review of the MIP images confirms the above findings IMPRESSION: No large vessel occlusion. No high-grade stenosis, aneurysm, or dissection of the arteries in the head and neck. Electronically Signed   By: Denny Flack M.D.   On: 08/08/2023 19:15   CT HEAD CODE STROKE WO CONTRAST Result Date: 08/08/2023 CLINICAL DATA:  Code stroke.  Neuro deficit, concern for  stroke. EXAM: CT HEAD WITHOUT CONTRAST TECHNIQUE: Contiguous axial images were obtained from the base of the skull through the vertex without intravenous contrast. RADIATION DOSE REDUCTION: This exam was performed according to the departmental dose-optimization program which includes automated exposure control, adjustment of the mA and/or kV according to patient size and/or use of iterative reconstruction technique. COMPARISON:  MRI head 11/30/2022. FINDINGS: Brain: No acute intracranial hemorrhage. Redemonstrated remote infarct in the left temporal occipital lobes. Additional small remote infarct involving the superior right occipital lobe. There is edema at the skull base which slightly limits the right middle cranial fossa. Within this limitation there is concern for possible loss of gray-white matter differentiation. Nonspecific hypoattenuation in the periventricular and subcortical white matter favored to reflect chronic microvascular ischemic changes. No significant edema, mass effect, or midline shift. The basilar cisterns are patent. Ventricles: The ventricles are normal. Vascular: No hyperdense vessel or unexpected calcification. Skull: No acute or aggressive finding. Orbits: Orbits are symmetric. Sinuses: The visualized paranasal sinuses are clear. Other: Mastoid air cells are clear. ASPECTS [facility] Stroke Program Early CT Score) - Ganglionic level infarction (caudate, lentiform nuclei, internal capsule, insula, M1-M3 cortex): 6 - Supraganglionic infarction (M4-M6 cortex): 3 Total score (0-10 with 10 being normal): 9 IMPRESSION: 1. Right middle cranial fossa is slightly limited by artifact. Within these limitations there is concern for loss of gray-white differentiation within the anterior left temporal lobe concerning for acute infarct. Recommend MRI for further evaluation. 2. 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 3. Chronic microvascular ischemic changes. 4.  ASPECTS is 9 These results were communicated to Dr. Doretta Gant At 3:19 pm on 08/08/2023 by text page via the [facility] messaging system. Electronically Signed   By: Denny Flack M.D.   On: 08/08/2023 15:19    Microbiology: No results found for this or any previous visit (from the past 240 hours).   Labs: Basic Metabolic Panel: Recent Labs  Lab 08/08/23 1643 08/09/23 0522 08/10/23 0434  NA 137 137 134*  K 4.1 3.3* 3.8  CL 106 106 104  CO2 19* 20* 21*  GLUCOSE 134* 103* 152*  BUN 17 13 14   CREATININE 1.13* 1.02* 1.12*  CALCIUM  9.1 8.9 8.8*  MG 1.6*  --   --    Liver Function Tests: Recent Labs  Lab 08/08/23 1643 08/09/23 0522  AST 27 23  ALT 16 15  ALKPHOS 56 68  BILITOT 0.7 0.5  PROT 7.2 7.0  ALBUMIN 3.1* 3.1*   No results for input(s): "LIPASE", "AMYLASE" in the last 168 hours. No results for input(s): "AMMONIA" in the last 168 hours. CBC: Recent Labs  Lab 08/08/23 1643 08/09/23 0522  WBC 7.1 6.3  NEUTROABS 2.9  --   HGB 14.1 13.9  HCT 45.2 44.9  MCV 90.6 90.2  PLT 130* 127*   Cardiac Enzymes: No results for input(s): "CKTOTAL", "CKMB", "CKMBINDEX", "TROPONINI" in the last 168 hours. BNP: BNP (last 3 results) No results for input(s): "BNP" in the last 8760 hours.  ProBNP (last 3 results) No results for input(s): "PROBNP" in the last 8760 hours.  CBG: Recent  Labs  Lab 08/09/23 0801 08/09/23 1230 08/09/23 1719 08/09/23 2130 08/10/23 0636  GLUCAP 107* 141* 188* 243* 194*       Signed:  Deforest Fast MD.  [facility] 08/10/2023, 11:59 AM

## 2023-08-11 ENCOUNTER — Inpatient Hospital Stay (HOSPITAL_COMMUNITY)

## 2023-08-11 DIAGNOSIS — R55 Syncope and collapse: Secondary | ICD-10-CM | POA: Diagnosis not present

## 2023-08-11 LAB — COMPREHENSIVE METABOLIC PANEL WITH GFR
ALT: 17 U/L (ref 0–44)
AST: 18 U/L (ref 15–41)
Albumin: 2.9 g/dL — ABNORMAL LOW (ref 3.5–5.0)
Alkaline Phosphatase: 71 U/L (ref 38–126)
Anion gap: 11 (ref 5–15)
BUN: 16 mg/dL (ref 6–20)
CO2: 21 mmol/L — ABNORMAL LOW (ref 22–32)
Calcium: 9 mg/dL (ref 8.9–10.3)
Chloride: 103 mmol/L (ref 98–111)
Creatinine, Ser: 0.99 mg/dL (ref 0.44–1.00)
GFR, Estimated: >60 mL/min (ref >60)
Glucose, Bld: 173 mg/dL — ABNORMAL HIGH (ref 70–99)
Potassium: 4 mmol/L (ref 3.5–5.1)
Sodium: 135 mmol/L (ref 135–145)
Total Bilirubin: 0.8 mg/dL (ref 0.0–1.2)
Total Protein: 7.6 g/dL (ref 6.5–8.1)

## 2023-08-11 LAB — CBC WITH DIFFERENTIAL/PLATELET
Abs Immature Granulocytes: 0.06 10*3/uL (ref 0.00–0.07)
Basophils Absolute: 0.1 10*3/uL (ref 0.0–0.1)
Basophils Relative: 1 %
Eosinophils Absolute: 0.1 10*3/uL (ref 0.0–0.5)
Eosinophils Relative: 1 %
HCT: 45.9 % (ref 36.0–46.0)
Hemoglobin: 15 g/dL (ref 12.0–15.0)
Immature Granulocytes: 1 %
Lymphocytes Relative: 37 %
Lymphs Abs: 2.9 10*3/uL (ref 0.7–4.0)
MCH: 28.5 pg (ref 26.0–34.0)
MCHC: 32.7 g/dL (ref 30.0–36.0)
MCV: 87.1 fL (ref 80.0–100.0)
Monocytes Absolute: 0.9 10*3/uL (ref 0.1–1.0)
Monocytes Relative: 12 %
Neutro Abs: 3.9 10*3/uL (ref 1.7–7.7)
Neutrophils Relative %: 48 %
Platelets: 143 10*3/uL — ABNORMAL LOW (ref 150–400)
RBC: 5.27 MIL/uL — ABNORMAL HIGH (ref 3.87–5.11)
RDW: 13.7 % (ref 11.5–15.5)
WBC: 8 10*3/uL (ref 4.0–10.5)
nRBC: 0 % (ref 0.0–0.2)

## 2023-08-11 LAB — GLUCOSE, CAPILLARY
Glucose-Capillary: 159 mg/dL — ABNORMAL HIGH (ref 70–99)
Glucose-Capillary: 217 mg/dL — ABNORMAL HIGH (ref 70–99)
Glucose-Capillary: 224 mg/dL — ABNORMAL HIGH (ref 70–99)
Glucose-Capillary: 204 mg/dL — ABNORMAL HIGH (ref 70–99)

## 2023-08-11 MED ORDER — ALUM & MAG HYDROXIDE-SIMETH 200-200-20 MG/5ML PO SUSP
15.0000 mL | Freq: Once | ORAL | Status: AC
  Administered 2023-08-11: 15 mL via ORAL
  Filled 2023-08-11: qty 30

## 2023-08-11 MED ORDER — IOHEXOL 350 MG/ML SOLN
75.0000 mL | Freq: Once | INTRAVENOUS | Status: AC | PRN
  Administered 2023-08-11: 75 mL via INTRAVENOUS

## 2023-08-11 NOTE — Progress Notes (Addendum)
 PROGRESS NOTE    [name]  WRU:[id number] DOB: 1970-07-02 DOA: 08/08/2023 PCP: Clydene Darner, PA-C  Chief Complaint  Patient presents with   Code Stroke    Brief Narrative:   52/F with history of multiple sclerosis, seizure disorder on Depakote and Topamax , headaches, chronic migraines, chronic pain, narcotic dependence, type 2 diabetes mellitus, arthritis, chronic back pain was brought to the ED as [name] code stroke for left-sided weakness.  Patient reports that she usually uses rollator or wheelchair to ambulate, she tried to walk to the bathroom holding the sides and next thing she remembers is being down on the floor, recalling hitting her head, back, knee etc. -Subsequent to this reports having increased pain and weakness in both legs especially the right, also reports pain in her mid back, EMS was called and brought to the ED -Vital signs were stable, CBG 140, BMP largely unremarkable except low magnesium and low platelets, CT head limited by motion artifact, cannot rule out abnormality in anterior left temporal lobe concerning for acute infarct.    Assessment & Plan:   Principal Problem:   Syncope, vasovagal Active Problems:   Multiple sclerosis (HCC)   Tobacco abuse   DM2 (diabetes mellitus, type 2) (HCC)   Protein-calorie malnutrition, severe (HCC)   Seizure (HCC)   Chronic pain syndrome  Syncope Orthostatic Hypotension Acute on chronic worsening functional deficits -Difficult to tease out how much of her deficits are new, considering underlying multiple sclerosis with moderate bilateral lower extremity weakness and mobility deficits at baseline, some limitation from pain after her fall as well - seen by neurology, 6/9 thought syncope related to orthostasis  - MRI brain negative for acute CVA - recurrent orthostasis today, SBP to 80's  - compression stockings, I don't think she'd tolerante abdominal binder with her current abdominal discomfort - elevate HOB (she  spends most time at home in bed, likely worsening orthostasis) - lisinopril  has been stopped - follow with neurology outpatient  - PT OT eval completed, SNF recommended, plan for short-term rehab -Follow-up with neurologist in [place]   Abdominal Pain Follow CT abd/pelvis - seems somewhat chronic, but more impressive than I'd think  Multiple sclerosis (HCC) Chronic pain -Resume home regimen of antispasmodics and oxycodone  and fentanyl  patch -Imaging of knee and thoracic spine without fractures -Needs close follow-up with her neurologist in [place]   Hypertension Lisinopril  discontinued with orthostatics    DM2 (diabetes mellitus, type 2) (HCC) Glargine dose increased to 35 units   H/o  Seizures  - Continue home dose of Topamax  and Depakote, Depakote level was 57   Mild thrombocytopenia - related to depakote? - monitor   Tobacco abuse Encourage cessation  Protein-calorie malnutrition, severe (HCC) Obesity Body mass index is 30.13 kg/m.     DVT prophylaxis: SCD Code Status: full Family Communication: none Disposition:   Status is: Inpatient Remains inpatient appropriate because: need for ongoing inpatient care   Consultants:  neurology  Procedures:  Echo IMPRESSIONS     1. Left ventricular ejection fraction, by estimation, is 60 to 65%. The  left ventricle has normal function. The left ventricle has no regional  wall motion abnormalities. Left ventricular diastolic parameters are  indeterminate.   2. Right ventricular systolic function is normal. The right ventricular  size is normal.   3. The mitral valve is normal in structure. No evidence of mitral valve  regurgitation. No evidence of mitral stenosis.   4. The aortic valve is normal in structure. Aortic  valve regurgitation is  not visualized. No aortic stenosis is present.   5. The inferior vena cava is normal in size with greater than 50%  respiratory variability, suggesting right atrial  pressure of 3 mmHg.   Antimicrobials:  Anti-infectives (From admission, onward)    None       Subjective: C/o chronic lightheadedness C/o abdominal discomfort (this seems to be somewhat chronic, but maybe Andrea Wade bit worse?)  Objective: Vitals:   08/11/23 0357 08/11/23 0817 08/11/23 1139 08/11/23 1636  BP: (!) 132/90 119/79 126/88 117/79  Pulse: 91 89 89 85  Resp: 16 18 19 18   Temp: 97.8 F (36.6 C) 97.9 F (36.6 C) (!) 97.5 F (36.4 C) 97.6 F (36.4 C)  TempSrc: Oral Oral Oral Oral  SpO2: 92% 94% 95% 90%  Weight:      Height:        Intake/Output Summary (Last 24 hours) at 08/11/2023 1803 Last data filed at 08/11/2023 1300 Gross per 24 hour  Intake 240 ml  Output 1450 ml  Net -1210 ml   Filed Weights   08/08/23 1459 08/08/23 1518  Weight: 92.2 kg 92.5 kg    Examination:  General exam: Appears calm and comfortable  Respiratory system: unlabored Cardiovascular system: RRR Gastrointestinal system: diffusely TTP Central nervous system: Alert and oriented. No focal neurological deficits. Extremities: no LEE   Data Reviewed: I have personally reviewed following labs and imaging studies  CBC: Recent Labs  Lab 08/08/23 1643 08/09/23 0522 08/11/23 0810  WBC 7.1 6.3 8.0  NEUTROABS 2.9  --  3.9  HGB 14.1 13.9 15.0  HCT 45.2 44.9 45.9  MCV 90.6 90.2 87.1  PLT 130* 127* 143*    Basic Metabolic Panel: Recent Labs  Lab 08/08/23 1643 08/09/23 0522 08/10/23 0434 08/11/23 0810  NA 137 137 134* 135  K 4.1 3.3* 3.8 4.0  CL 106 106 104 103  CO2 19* 20* 21* 21*  GLUCOSE 134* 103* 152* 173*  BUN 17 13 14 16   CREATININE 1.13* 1.02* 1.12* 0.99  CALCIUM  9.1 8.9 8.8* 9.0  MG 1.6*  --   --   --     GFR: Estimated Creatinine Clearance: 79.6 mL/min (by C-G formula based on SCr of 0.99 mg/dL).  Liver Function Tests: Recent Labs  Lab 08/08/23 1643 08/09/23 0522 08/11/23 0810  AST 27 23 18   ALT 16 15 17   ALKPHOS 56 68 71  BILITOT 0.7 0.5 0.8  PROT 7.2 7.0  7.6  ALBUMIN 3.1* 3.1* 2.9*    CBG: Recent Labs  Lab 08/10/23 1622 08/10/23 2114 08/11/23 0615 08/11/23 1135 08/11/23 1637  GLUCAP 270* 219* 159* 217* 224*     No results found for this or any previous visit (from the past 240 hours).       Radiology Studies: No results found.      Scheduled Meds:  aspirin  EC  81 mg Oral Daily   baclofen   10 mg Oral TID   buPROPion   150 mg Oral q morning   divalproex  500 mg Oral QHS   DULoxetine   60 mg Oral Daily   fentaNYL   1 patch Transdermal Q72H   folic acid   1 mg Oral Daily   insulin  aspart  0-9 Units Subcutaneous TID WC   insulin  glargine  35 Units Subcutaneous QHS   nortriptyline   75 mg Oral QHS   pantoprazole   40 mg Oral Daily   polyethylene glycol  17 g Oral Daily   rosuvastatin  5 mg Oral  Daily   senna-docusate  1 tablet Oral BID   topiramate   200 mg Oral BID   Continuous Infusions:   LOS: 2 days    Time spent: over 30 min    Donnetta Gains, MD [facility]   To contact the attending provider between 7A-7P or the covering provider during after hours 7P-7A, please log into the web site [email or web address] and access using universal [place] password for that web site. If you do not have the password, please call the hospital operator.  08/11/2023, 6:03 PM

## 2023-08-11 NOTE — Plan of Care (Signed)
 Did get up into the chair today, although with much resistance.  Was up in the chair for about 20 minutes and was requesting to be put back into the bed.  Had to be encouraged to stay up a while.  Did admit to feeling better after being up out of the bed a couple hours.    Problem: Education: Goal: Knowledge of secondary prevention will improve (MUST DOCUMENT ALL) Outcome: Progressing   Problem: Education: Goal: Knowledge of patient specific risk factors will improve (DELETE if not current risk factor) Outcome: Progressing   Problem: Coping: Goal: Ability to adjust to condition or change in health will improve Outcome: Progressing   Problem: Nutritional: Goal: Maintenance of adequate nutrition will improve Outcome: Progressing   Problem: Skin Integrity: Goal: Risk for impaired skin integrity will decrease Outcome: Progressing

## 2023-08-11 NOTE — Plan of Care (Signed)
  Problem: Education: Goal: Knowledge of disease or condition will improve Outcome: Progressing Goal: Knowledge of secondary prevention will improve (MUST DOCUMENT ALL) Outcome: Progressing Goal: Knowledge of patient specific risk factors will improve (DELETE if not current risk factor) Outcome: Progressing   Problem: Ischemic Stroke/TIA Tissue Perfusion: Goal: Complications of ischemic stroke/TIA will be minimized Outcome: Progressing   Problem: Coping: Goal: Will verbalize positive feelings about self Outcome: Progressing Goal: Will identify appropriate support needs Outcome: Progressing   Problem: Health Behavior/Discharge Planning: Goal: Ability to manage health-related needs will improve Outcome: Progressing Goal: Goals will be collaboratively established with patient/family Outcome: Progressing   Problem: Self-Care: Goal: Ability to participate in self-care as condition permits will improve Outcome: Progressing Goal: Verbalization of feelings and concerns over difficulty with self-care will improve Outcome: Progressing   Problem: Education: Goal: Ability to describe self-care measures that may prevent or decrease complications (Diabetes Survival Skills Education) will improve Outcome: Progressing Goal: Individualized Educational Video(s) Outcome: Progressing   Problem: Coping: Goal: Ability to adjust to condition or change in health will improve Outcome: Progressing   Problem: Health Behavior/Discharge Planning: Goal: Ability to manage health-related needs will improve Outcome: Progressing   Problem: Metabolic: Goal: Ability to maintain appropriate glucose levels will improve Outcome: Progressing   Problem: Nutritional: Goal: Maintenance of adequate nutrition will improve Outcome: Progressing Goal: Progress toward achieving an optimal weight will improve Outcome: Progressing   Problem: Skin Integrity: Goal: Risk for impaired skin integrity will  decrease Outcome: Progressing   Problem: Education: Goal: Knowledge of General Education information will improve Description: Including pain rating scale, medication(s)/side effects and non-pharmacologic comfort measures Outcome: Progressing   Problem: Health Behavior/Discharge Planning: Goal: Ability to manage health-related needs will improve Outcome: Progressing   Problem: Clinical Measurements: Goal: Will remain free from infection Outcome: Progressing Goal: Diagnostic test results will improve Outcome: Progressing Goal: Respiratory complications will improve Outcome: Progressing   Problem: Activity: Goal: Risk for activity intolerance will decrease Outcome: Progressing   Problem: Nutrition: Goal: Adequate nutrition will be maintained Outcome: Progressing   Problem: Elimination: Goal: Will not experience complications related to bowel motility Outcome: Progressing   Problem: Pain Managment: Goal: General experience of comfort will improve and/or be controlled Outcome: Progressing   Problem: Safety: Goal: Ability to remain free from injury will improve Outcome: Progressing   Problem: Skin Integrity: Goal: Risk for impaired skin integrity will decrease Outcome: Progressing

## 2023-08-11 NOTE — Progress Notes (Signed)
 Speech Language Pathology Treatment: Cognitive-Linguistic  Patient Details Name: Andrea Wade MRN: [id number] DOB: January 10, 1971 Today's Date: 08/11/2023 Time: 0865-7846 SLP Time Calculation (min) (ACUTE ONLY): 8 min  Assessment / Plan / Recommendation Clinical Impression  Session today was limited overall by lethargy. She was unable to maintain an adequate level of alertness during a structured task but was able to identify accurate pill placement in a pillbox activity in 1/8 opportunities given Max multimodal cueing. Given performance during evaluation, suspect performance today was inhibited by lethargy but feel she may benefit from ongoing SLP f/u, <3 hrs/day. Will follow.    HPI HPI: Tiarrah Saville is a chronically ill 52/F with history of multiple sclerosis, seizure disorder on Depakote and Topamax , headaches, chronic migraines, chronic pain, narcotic dependence, type 2 diabetes mellitus, arthritis, chronic back pain was brought to the ED as a code stroke for left-sided weakness. CT head limited by motion artifact, cannot rule out abnormality in anterior left temporal lobe concerning for acute infarct.  MRI revealed no acute intracranial abnormality.      SLP Plan  Continue with current plan of care          Recommendations                         Frequent or constant Supervision/Assistance Cognitive communication deficit (N62.952)     Continue with current plan of care     Amil Kale, M.A., CCC-SLP Speech Language Pathology, Acute Rehabilitation Services  Secure Chat preferred [phone]   08/11/2023, 4:52 PM

## 2023-08-11 NOTE — Progress Notes (Signed)
 Physical Therapy Treatment Patient Details Name: Andrea Wade MRN: [id number] DOB: [birth year] Today's Date: 08/11/2023   History of Present Illness 16 F adm 08/08/23 brought to the ED as a code stroke for left-sided weakness.  MR Brain:  No acute intracranial abnormality.  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 PMH includes multiple sclerosis, seizure disorder on Depakote and Topamax , headaches, chronic migraines, chronic pain, narcotic dependence, type 2 diabetes mellitus, arthritis, chronic back pain.    PT Comments  Pt is progressing towards goals. Currently pt is mod A for bed mobility, 2 person Mod A for sit to stand and transfers with RW. Pt continues to require heavy multi modal cues for sequencing and safety. Due to pt current functional status, home set up and available assistance at home recommending skilled physical therapy services < 3 hours/day in order to address strength, balance and functional mobility to decrease risk for falls, injury, immobility, skin break down and re-hospitalization.      If plan is discharge home, recommend the following: A lot of help with walking and/or transfers;Assistance with cooking/housework;Assist for transportation   Can travel by private vehicle     No  Equipment Recommendations  None recommended by PT       Precautions / Restrictions Precautions Precautions: Fall Restrictions Weight Bearing Restrictions Per Provider Order: No     Mobility  Bed Mobility Overal bed mobility: Needs Assistance Bed Mobility: Sidelying to Sit   Sidelying to sit: Mod assist     Sit to sidelying: Mod assist General bed mobility comments: assist for trunk and extra time for LE to EOB. Pt needs Mod A to scoot to EOB to get feet on floor. Multi modal cues for sequencing.    Transfers Overall transfer level: Needs assistance Equipment used: Rolling walker (2 wheels) Transfers: Sit to/from Stand, Bed to chair/wheelchair/BSC Sit to Stand: Mod  assist   Step pivot transfers: Mod assist, +2 physical assistance, +2 safety/equipment       General transfer comment: assist for initial momentum to get to standing and steadying assist. Mod A for stepping from EOB to recliner    Ambulation/Gait             Pre-gait activities: Pt able to take steps from EOB to recliner with Mod A +2 for balance and use of RW including navigation         Balance Overall balance assessment: Needs assistance Sitting-balance support: Feet supported, Single extremity supported Sitting balance-Leahy Scale: Fair     Standing balance support: Reliant on assistive device for balance, Bilateral upper extremity supported Standing balance-Leahy Scale: Poor Standing balance comment: requires external support for balance.      Communication Communication Communication: No apparent difficulties  Cognition Arousal: Alert Behavior During Therapy: WFL for tasks assessed/performed   PT - Cognitive impairments: No apparent impairments       Following commands: Intact            General Comments General comments (skin integrity, edema, etc.): Pt BP supine123/87, sitting 115/81, after transfer 82/68 after reclining in chair 111/86      Pertinent Vitals/Pain Pain Assessment Pain Assessment: 0-10 Pain Score: 10-Worst pain ever Pain Location: back and bil LE Pain Descriptors / Indicators: Aching Pain Intervention(s): Monitored during session, Limited activity within patient's tolerance, Patient requesting pain meds-RN notified     PT Goals (current goals can now be found in the care plan section) Acute Rehab PT Goals Patient Stated Goal: back to baseline PT Goal  Formulation: With patient Time For Goal Achievement: 08/23/23 Potential to Achieve Goals: Good Progress towards PT goals: Progressing toward goals    Frequency    Min 2X/week      PT Plan  Continue with current POC        AM-PAC PT 6 Clicks Mobility   Outcome  Measure  Help needed turning from your back to your side while in a flat bed without using bedrails?: A Lot Help needed moving from lying on your back to sitting on the side of a flat bed without using bedrails?: A Lot Help needed moving to and from a bed to a chair (including a wheelchair)?: A Lot Help needed standing up from a chair using your arms (e.g., wheelchair or bedside chair)?: A Lot Help needed to walk in hospital room?: Total Help needed climbing 3-5 steps with a railing? : Total 6 Click Score: 10    End of Session Equipment Utilized During Treatment: Gait belt Activity Tolerance: Patient tolerated treatment well Patient left: with call bell/phone within reach;in chair;with chair alarm set Nurse Communication: Mobility status PT Visit Diagnosis: Other abnormalities of gait and mobility (R26.89);Muscle weakness (generalized) (M62.81)     Time: 1610-9604 PT Time Calculation (min) (ACUTE ONLY): 23 min  Charges:    $Therapeutic Activity: 23-37 mins PT General Charges $$ ACUTE PT VISIT: 1 Visit                     Sloan Duncans, DPT, CLT  Acute Rehabilitation Services Office: [phone] (Secure chat preferred)    Jenice Mitts 08/11/2023, 2:17 PM

## 2023-08-12 ENCOUNTER — Inpatient Hospital Stay (HOSPITAL_COMMUNITY)

## 2023-08-12 DIAGNOSIS — R55 Syncope and collapse: Secondary | ICD-10-CM | POA: Diagnosis not present

## 2023-08-12 LAB — CBC WITH DIFFERENTIAL/PLATELET
Abs Immature Granulocytes: 0.05 10*3/uL (ref 0.00–0.07)
Basophils Absolute: 0 10*3/uL (ref 0.0–0.1)
Basophils Relative: 0 %
Eosinophils Absolute: 0.3 10*3/uL (ref 0.0–0.5)
Eosinophils Relative: 3 %
HCT: 44.3 % (ref 36.0–46.0)
Hemoglobin: 14.3 g/dL (ref 12.0–15.0)
Immature Granulocytes: 1 %
Lymphocytes Relative: 32 %
Lymphs Abs: 2.9 10*3/uL (ref 0.7–4.0)
MCH: 28.4 pg (ref 26.0–34.0)
MCHC: 32.3 g/dL (ref 30.0–36.0)
MCV: 87.9 fL (ref 80.0–100.0)
Monocytes Absolute: 0.9 10*3/uL (ref 0.1–1.0)
Monocytes Relative: 10 %
Neutro Abs: 4.8 10*3/uL (ref 1.7–7.7)
Neutrophils Relative %: 54 %
Platelets: 147 10*3/uL — ABNORMAL LOW (ref 150–400)
RBC: 5.04 MIL/uL (ref 3.87–5.11)
RDW: 13.8 % (ref 11.5–15.5)
WBC: 8.9 10*3/uL (ref 4.0–10.5)
nRBC: 0 % (ref 0.0–0.2)

## 2023-08-12 LAB — COMPREHENSIVE METABOLIC PANEL WITH GFR
ALT: 15 U/L (ref 0–44)
AST: 18 U/L (ref 15–41)
Albumin: 2.7 g/dL — ABNORMAL LOW (ref 3.5–5.0)
Alkaline Phosphatase: 65 U/L (ref 38–126)
Anion gap: 8 (ref 5–15)
BUN: 21 mg/dL — ABNORMAL HIGH (ref 6–20)
CO2: 23 mmol/L (ref 22–32)
Calcium: 9.1 mg/dL (ref 8.9–10.3)
Chloride: 104 mmol/L (ref 98–111)
Creatinine, Ser: 0.96 mg/dL (ref 0.44–1.00)
GFR, Estimated: >60 mL/min (ref >60)
Glucose, Bld: 144 mg/dL — ABNORMAL HIGH (ref 70–99)
Potassium: 3.7 mmol/L (ref 3.5–5.1)
Sodium: 135 mmol/L (ref 135–145)
Total Bilirubin: 0.5 mg/dL (ref 0.0–1.2)
Total Protein: 7.4 g/dL (ref 6.5–8.1)

## 2023-08-12 LAB — GLUCOSE, CAPILLARY
Glucose-Capillary: 140 mg/dL — ABNORMAL HIGH (ref 70–99)
Glucose-Capillary: 144 mg/dL — ABNORMAL HIGH (ref 70–99)
Glucose-Capillary: 151 mg/dL — ABNORMAL HIGH (ref 70–99)
Glucose-Capillary: 178 mg/dL — ABNORMAL HIGH (ref 70–99)

## 2023-08-12 LAB — CORTISOL: Cortisol, Plasma: 8.3 ug/dL

## 2023-08-12 LAB — MAGNESIUM: Magnesium: 1.8 mg/dL (ref 1.7–2.4)

## 2023-08-12 LAB — PHOSPHORUS: Phosphorus: 3.9 mg/dL (ref 2.5–4.6)

## 2023-08-12 MED ORDER — COSYNTROPIN 0.25 MG IJ SOLR
0.2500 mg | Freq: Once | INTRAMUSCULAR | Status: AC
  Administered 2023-08-13: 0.25 mg via INTRAVENOUS
  Filled 2023-08-12: qty 0.25

## 2023-08-12 MED ORDER — GADOBUTROL 1 MMOL/ML IV SOLN
9.0000 mL | Freq: Once | INTRAVENOUS | Status: AC | PRN
  Administered 2023-08-12: 9 mL via INTRAVENOUS

## 2023-08-12 MED ORDER — GADOBUTROL 1 MMOL/ML IV SOLN
9.0000 mL | Freq: Once | INTRAVENOUS | Status: AC | PRN
Start: 2023-08-12 — End: 2023-08-12
  Administered 2023-08-12: 9 mL via INTRAVENOUS

## 2023-08-12 MED ORDER — LORAZEPAM 2 MG/ML IJ SOLN
0.5000 mg | Freq: Once | INTRAMUSCULAR | Status: AC
  Administered 2023-08-12: 0.5 mg via INTRAVENOUS
  Filled 2023-08-12: qty 1

## 2023-08-12 NOTE — Plan of Care (Signed)
  Problem: Education: Goal: Knowledge of disease or condition will improve Outcome: Progressing Goal: Knowledge of secondary prevention will improve (MUST DOCUMENT ALL) Outcome: Progressing Goal: Knowledge of patient specific risk factors will improve (DELETE if not current risk factor) Outcome: Progressing   Problem: Ischemic Stroke/TIA Tissue Perfusion: Goal: Complications of ischemic stroke/TIA will be minimized Outcome: Progressing   Problem: Coping: Goal: Will verbalize positive feelings about self Outcome: Progressing Goal: Will identify appropriate support needs Outcome: Progressing   Problem: Health Behavior/Discharge Planning: Goal: Ability to manage health-related needs will improve Outcome: Progressing Goal: Goals will be collaboratively established with patient/family Outcome: Progressing   Problem: Self-Care: Goal: Ability to participate in self-care as condition permits will improve Outcome: Progressing Goal: Verbalization of feelings and concerns over difficulty with self-care will improve Outcome: Progressing   Problem: Education: Goal: Ability to describe self-care measures that may prevent or decrease complications (Diabetes Survival Skills Education) will improve Outcome: Progressing Goal: Individualized Educational Video(s) Outcome: Progressing   Problem: Coping: Goal: Ability to adjust to condition or change in health will improve Outcome: Progressing   Problem: Health Behavior/Discharge Planning: Goal: Ability to manage health-related needs will improve Outcome: Progressing   Problem: Metabolic: Goal: Ability to maintain appropriate glucose levels will improve Outcome: Progressing   Problem: Nutritional: Goal: Maintenance of adequate nutrition will improve Outcome: Progressing Goal: Progress toward achieving an optimal weight will improve Outcome: Progressing   Problem: Skin Integrity: Goal: Risk for impaired skin integrity will  decrease Outcome: Progressing   Problem: Education: Goal: Knowledge of General Education information will improve Description: Including pain rating scale, medication(s)/side effects and non-pharmacologic comfort measures Outcome: Progressing   Problem: Health Behavior/Discharge Planning: Goal: Ability to manage health-related needs will improve Outcome: Progressing   Problem: Clinical Measurements: Goal: Will remain free from infection Outcome: Progressing Goal: Diagnostic test results will improve Outcome: Progressing Goal: Respiratory complications will improve Outcome: Progressing   Problem: Activity: Goal: Risk for activity intolerance will decrease Outcome: Progressing   Problem: Nutrition: Goal: Adequate nutrition will be maintained Outcome: Progressing   Problem: Elimination: Goal: Will not experience complications related to bowel motility Outcome: Progressing   Problem: Pain Managment: Goal: General experience of comfort will improve and/or be controlled Outcome: Progressing   Problem: Safety: Goal: Ability to remain free from injury will improve Outcome: Progressing   Problem: Skin Integrity: Goal: Risk for impaired skin integrity will decrease Outcome: Progressing

## 2023-08-12 NOTE — Care Management Important Message (Signed)
 Important Message  Patient Details  Name: Andrea Wade MRN: [id number] Date of Birth: 02/22/1971   Important Message Given:  Yes - Medicare IM     Felix Host 08/12/2023, 3:00 PM

## 2023-08-12 NOTE — Progress Notes (Signed)
 PROGRESS NOTE    Andrea Wade  GNF:[id number] DOB: [birth year] DOA: 08/08/2023 PCP: Clydene Darner, PA-C  Chief Complaint  Patient presents with   Code Stroke    Brief Narrative:   52/F with history of multiple sclerosis, seizure disorder on Depakote and Topamax , headaches, chronic migraines, chronic pain, narcotic dependence, type 2 diabetes mellitus, arthritis, chronic back pain was brought to the ED as Andrea Wade code stroke for left-sided weakness.  Patient reports that she usually uses rollator or wheelchair to ambulate, she tried to walk to the bathroom holding the sides and next thing she remembers is being down on the floor, recalling hitting her head, back, knee etc. -Subsequent to this reports having increased pain and weakness in both legs especially the right, also reports pain in her mid back, EMS was called and brought to the ED -Vital signs were stable, CBG 140, BMP largely unremarkable except low magnesium and low platelets, CT head limited by motion artifact, cannot rule out abnormality in anterior left temporal lobe concerning for acute infarct.    Assessment & Plan:   Principal Problem:   Syncope, vasovagal Active Problems:   Multiple sclerosis (HCC)   Tobacco abuse   DM2 (diabetes mellitus, type 2) (HCC)   Protein-calorie malnutrition, severe (HCC)   Seizure (HCC)   Chronic pain syndrome  Syncope Orthostatic Hypotension Acute on chronic worsening functional deficits -Difficult to tease out how much of her deficits are new, considering underlying multiple sclerosis with moderate bilateral lower extremity weakness and mobility deficits at baseline, some limitation from pain after her fall as well - seen by neurology, 6/9 thought syncope related to orthostasis  - MRI brain negative for acute CVA - she was walking with walker at home, now requiring 2 person mod [place] for sit to stand and transfers with RW - consider additional imaging, will review - recurrent orthostasis  6/11, SBP to 80's  - compression stockings, I don't think she'd tolerante abdominal binder with her current abdominal discomfort - elevate HOB (she spends most time at home in bed, likely worsening orthostasis) - lisinopril  has been stopped - follow with neurology outpatient  - PT OT eval completed, SNF recommended, plan for short-term rehab -Follow-up with neurologist in [place]   Abdominal Pain Follow CT abd/pelvis - mild fullness of L distal ureter UA 6/9 not particularly impressive  Multiple sclerosis (HCC) Chronic pain -Resume home regimen of antispasmodics and oxycodone  and fentanyl  patch -Imaging of knee and thoracic spine without fractures -Needs close follow-up with her neurologist in [place]   Hypertension Lisinopril  discontinued with orthostatics    DM2 (diabetes mellitus, type 2) (HCC) Glargine dose increased to 35 units   H/o  Seizures  - Continue home dose of Topamax  and Depakote, Depakote level was 57   Mild thrombocytopenia - related to depakote? - monitor   Tobacco abuse Encourage cessation  Protein-calorie malnutrition, severe (HCC) Obesity Body mass index is 30.13 kg/m.     DVT prophylaxis: SCD Code Status: full Family Communication: none Disposition:   Status is: Inpatient Remains inpatient appropriate because: need for ongoing inpatient care   Consultants:  neurology  Procedures:  Echo IMPRESSIONS     1. Left ventricular ejection fraction, by estimation, is 60 to 65%. The  left ventricle has normal function. The left ventricle has no regional  wall motion abnormalities. Left ventricular diastolic parameters are  indeterminate.   2. Right ventricular systolic function is normal. The right ventricular  size is normal.   3.  The mitral valve is normal in structure. No evidence of mitral valve  regurgitation. No evidence of mitral stenosis.   4. The aortic valve is normal in structure. Aortic valve regurgitation is  not  visualized. No aortic stenosis is present.   5. The inferior vena cava is normal in size with greater than 50%  respiratory variability, suggesting right atrial pressure of 3 mmHg.   Antimicrobials:  Anti-infectives (From admission, onward)    None       Subjective: C/o chronic back pain, maybe Yanuel Tagg bit worse since fall  Objective: Vitals:   08/11/23 2020 08/11/23 2338 08/12/23 0334 08/12/23 0752  BP: 120/85 134/84 124/77 (P) 109/69  Pulse: 89 94 93 (P) 88  Resp: 19 20 18  (P) 18  Temp: 97.8 F (36.6 C) 98.4 F (36.9 C)  (P) 98 F (36.7 C)  TempSrc: Oral Oral Oral   SpO2: 95% 96% 92% (P) 93%  Weight:      Height:        Intake/Output Summary (Last 24 hours) at 08/12/2023 1022 Last data filed at 08/11/2023 2200 Gross per 24 hour  Intake 260 ml  Output 350 ml  Net -90 ml   Filed Weights   08/08/23 1459 08/08/23 1518  Weight: 92.2 kg 92.5 kg    Examination:  General: No acute distress. Cardiovascular: RRR. Lungs: unlabored Abdomen: less TTP today Neurological: Alert and oriented 3. Moves all extremities 4 with equal strength. Cranial nerves II through XII grossly intact. Extremities: No clubbing or cyanosis. No edema.    Data Reviewed: I have personally reviewed following labs and imaging studies  CBC: Recent Labs  Lab 08/08/23 1643 08/09/23 0522 08/11/23 0810 08/12/23 0547  WBC 7.1 6.3 8.0 8.9  NEUTROABS 2.9  --  3.9 4.8  HGB 14.1 13.9 15.0 14.3  HCT 45.2 44.9 45.9 44.3  MCV 90.6 90.2 87.1 87.9  PLT 130* 127* 143* 147*    Basic Metabolic Panel: Recent Labs  Lab 08/08/23 1643 08/09/23 0522 08/10/23 0434 08/11/23 0810 08/12/23 0547  NA 137 137 134* 135 135  K 4.1 3.3* 3.8 4.0 3.7  CL 106 106 104 103 104  CO2 19* 20* 21* 21* 23  GLUCOSE 134* 103* 152* 173* 144*  BUN 17 13 14 16  21*  CREATININE 1.13* 1.02* 1.12* 0.99 0.96  CALCIUM  9.1 8.9 8.8* 9.0 9.1  MG 1.6*  --   --   --  1.8  PHOS  --   --   --   --  3.9    GFR: Estimated Creatinine  Clearance: 82.1 mL/min (by C-G formula based on SCr of 0.96 mg/dL).  Liver Function Tests: Recent Labs  Lab 08/08/23 1643 08/09/23 0522 08/11/23 0810 08/12/23 0547  AST 27 23 18 18   ALT 16 15 17 15   ALKPHOS 56 68 71 65  BILITOT 0.7 0.5 0.8 0.5  PROT 7.2 7.0 7.6 7.4  ALBUMIN 3.1* 3.1* 2.9* 2.7*    CBG: Recent Labs  Lab 08/11/23 0615 08/11/23 1135 08/11/23 1637 08/11/23 2130 08/12/23 0630  GLUCAP 159* 217* 224* 204* 144*     No results found for this or any previous visit (from the past 240 hours).       Radiology Studies: CT ABDOMEN PELVIS W CONTRAST Result Date: 08/11/2023 CLINICAL DATA:  Acute abdominal pain EXAM: CT ABDOMEN AND PELVIS WITH CONTRAST TECHNIQUE: Multidetector CT imaging of the abdomen and pelvis was performed using the standard protocol following bolus administration of intravenous contrast. RADIATION DOSE REDUCTION:  This exam was performed according to the departmental dose-optimization program which includes automated exposure control, adjustment of the mA and/or kV according to patient size and/or use of iterative reconstruction technique. CONTRAST:  75mL OMNIPAQUE  IOHEXOL  350 MG/ML SOLN COMPARISON:  11/30/2022 FINDINGS: Lower chest: Mild atelectatic changes are noted in the right lower lobe. Hepatobiliary: No focal liver abnormality is seen. Status post cholecystectomy. No biliary dilatation. Pancreas: Unremarkable. No pancreatic ductal dilatation or surrounding inflammatory changes. Spleen: Normal in size without focal abnormality. Adrenals/Urinary Tract: Adrenal glands are within normal limits. Kidneys are well visualize within normal enhancement pattern bilaterally. Punctate upper pole renal stone is again seen on the right. No obstructive changes are seen. The bladder is well distended. The ureters demonstrate minimal fullness in the distal left ureter although no discrete distal ureteral stone is noted. Stomach/Bowel: No obstructive or inflammatory  changes of the colon are seen. Scattered fecal material and ingested tablets are noted throughout the colon. Ingested material is noted throughout the stomach. No obstructive changes are seen in the small bowel. The appendix is well visualized. No inflammatory changes to suggest appendicitis are seen. Vascular/Lymphatic: No significant vascular findings are present. No enlarged abdominal or pelvic lymph nodes. Reproductive: Uterus and bilateral adnexa are unremarkable. Other: No abdominal wall hernia or abnormality. No abdominopelvic ascites. Musculoskeletal: Degenerative changes of lumbar spine are noted. IMPRESSION: No definitive renal or ureteral stones are seen. Mild fullness of the distal left ureter is seen although no discrete stone is noted. No other focal abnormality is noted. Electronically Signed   By: Violeta Grey M.D.   On: 08/11/2023 23:04        Scheduled Meds:  aspirin  EC  81 mg Oral Daily   baclofen   10 mg Oral TID   buPROPion   150 mg Oral q morning   [START ON 08/13/2023] cosyntropin  0.25 mg Intravenous Once   divalproex  500 mg Oral QHS   DULoxetine   60 mg Oral Daily   fentaNYL   1 patch Transdermal Q72H   folic acid   1 mg Oral Daily   insulin  aspart  0-9 Units Subcutaneous TID WC   insulin  glargine  35 Units Subcutaneous QHS   nortriptyline   75 mg Oral QHS   pantoprazole   40 mg Oral Daily   polyethylene glycol  17 g Oral Daily   rosuvastatin  5 mg Oral Daily   senna-docusate  1 tablet Oral BID   topiramate   200 mg Oral BID   Continuous Infusions:   LOS: 3 days    Time spent: over 30 min    Donnetta Gains, MD [facility]   To contact the attending provider between 7A-7P or the covering provider during after hours 7P-7A, please log into the web site [email or web address] and access using universal [place] password for that web site. If you do not have the password, please call the hospital operator.  08/12/2023, 10:22 AM

## 2023-08-12 NOTE — TOC Progression Note (Signed)
 Transition of Care [facility]) - Progression Note    Patient Details  Name: Andrea Wade MRN: [id number] Date of Birth: Aug 02, 1970  [facility]) CM/SW Contact  Tandy Fam, [place] Phone Number: 08/12/2023, 2:08 PM  Clinical Narrative:   CSW met with patient to discuss SNF offers. Patient has chosen [employer]. CSW confirmed with [facility] that bed is available for patient. CSW requested CMA to initiate insurance authorization request. Authorization is pending at this time. CSW to follow.    Expected Discharge Plan: Skilled Nursing Facility Barriers to Discharge: Continued Medical Work up, English as a second language teacher  Expected Discharge Plan and Services     Post Acute Care Choice: Skilled Nursing Facility Living arrangements for the past 2 months: Single Family Home                                       Social Determinants of Health (SDOH) Interventions SDOH Screenings   Food Insecurity: No Food Insecurity (08/09/2023)  Housing: Low Risk  (08/09/2023)  Transportation Needs: No Transportation Needs (08/09/2023)  Utilities: Not At Risk (08/09/2023)  Tobacco Use: Medium Risk (08/08/2023)    Readmission Risk Interventions     No data to display

## 2023-08-12 NOTE — TOC Progression Note (Signed)
 Transition of Care [facility]) - Progression Note    Patient Details  Name: Andrea Wade MRN: [id number] Date of Birth: 30-Oct-1970  Transition of Care [facility]) CM/SW Contact  Tandy Fam, [place] Phone Number: 08/12/2023, 2:07 PM  Clinical Narrative:   CSW contacted Admissions for [facility] and [facility], as they were both Considering. Both can offer a bed for the patient. CSW met with patient to provide bed offers. She requested time to speak with her husband to make the decision. CSW checked back throughout the day and patient had not been able to reach her husband yet to discuss. CSW to follow.    Expected Discharge Plan: Skilled Nursing Facility Barriers to Discharge: Continued Medical Work up, English as a second language teacher  Expected Discharge Plan and Services     Post Acute Care Choice: Skilled Nursing Facility Living arrangements for the past 2 months: Single Family Home                                       Social Determinants of Health (SDOH) Interventions SDOH Screenings   Food Insecurity: No Food Insecurity (08/09/2023)  Housing: Low Risk  (08/09/2023)  Transportation Needs: No Transportation Needs (08/09/2023)  Utilities: Not At Risk (08/09/2023)  Tobacco Use: Medium Risk (08/08/2023)    Readmission Risk Interventions     No data to display

## 2023-08-12 NOTE — Inpatient Diabetes Management (Signed)
 Inpatient Diabetes Program Recommendations  AACE/ADA: New Consensus Statement on Inpatient Glycemic Control (2015)  Target Ranges:  Prepandial:   less than 140 mg/dL      Peak postprandial:   less than 180 mg/dL (1-2 hours)      Critically ill patients:  140 - 180 mg/dL   Lab Results  Component Value Date   GLUCAP 144 (H) 08/12/2023   HGBA1C 9.6 (H) 08/09/2023    Latest Reference Range & Units 08/11/23 06:15 08/11/23 11:35 08/11/23 16:37 08/11/23 21:30 08/12/23 06:30  Glucose-Capillary 70 - 99 mg/dL 604 (H) 540 (H) 981 (H) 204 (H) 144 (H)  (H): Data is abnormally high  Diabetes history: DM2 Outpatient Diabetes medications: Tresiba 50 units at HS, Humalog TID (no scale listed), Synjardy XR 11-998 mg BID, Ozempic 0.25 mg weekly  Current orders for Inpatient glycemic control: Lantus  35 units at HS, Novolog  0-9 units scale TID   Inpatient Diabetes Program Recommendations:   Please consider if Postprandial CBGs >180: -Add Novolog  2 units tid if eats 50%  Thank you, Marily Shows E. Lissette Schenk, RN, MSN, CDCES  Diabetes Coordinator Inpatient Glycemic Control Team Team Pager [phone] (8am-5pm) 08/12/2023 10:49 AM\

## 2023-08-12 NOTE — Progress Notes (Signed)
 Occupational Therapy Treatment Patient Details Name: Andrea Wade MRN: [id number] DOB: 1971/01/21 Today's Date: 08/12/2023   History of present illness 73 F adm 08/08/23 brought to the ED as a code stroke for left-sided weakness.  MR Brain:  No acute intracranial abnormality.  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 PMH includes multiple sclerosis, seizure disorder on Depakote and Topamax , headaches, chronic migraines, chronic pain, narcotic dependence, type 2 diabetes mellitus, arthritis, chronic back pain.   OT comments  Pt continued to c/o diplopia present at all times, educated her on Lens occlusion and taped glasses of L eye which ended up resolving pt's diplopia. With use of counter pressure exercises pt able to tolerate standing EOB for >2 mins, was limited by weakness in BLEs instead of feeling dizzy or lightheaded. Her BP remained stable throughout the session. OT to continue efforts to progress pt as able. Patient will benefit from continued inpatient follow up therapy, <3 hours/day       If plan is discharge home, recommend the following:  A lot of help with bathing/dressing/bathroom;Two people to help with walking and/or transfers;Assist for transportation   Equipment Recommendations  None recommended by OT    Recommendations for Other Services      Precautions / Restrictions Precautions Precautions: Fall Recall of Precautions/Restrictions: Intact Restrictions Weight Bearing Restrictions Per Provider Order: No       Mobility Bed Mobility Overal bed mobility: Needs Assistance Bed Mobility: Supine to Sit, Sit to Sidelying   Sidelying to sit: Used rails Supine to sit: Mod assist   Sit to sidelying: Mod assist General bed mobility comments: Pt slow to progress BLEs to EOB, mod A to raise trunk and scoot to EOB. cues to use bed rails.    Transfers Overall transfer level: Needs assistance Equipment used: Rolling walker (2 wheels) Transfers: Sit to/from  Stand Sit to Stand: Mod assist           General transfer comment: worked with pt on weight shifts and offloading BLEs, Mod A to maintain balance, cues for larger movements.     Balance Overall balance assessment: Needs assistance Sitting-balance support: Feet supported, Single extremity supported Sitting balance-Leahy Scale: Fair     Standing balance support: Reliant on assistive device for balance, Bilateral upper extremity supported Standing balance-Leahy Scale: Poor Standing balance comment: requires external support for balance.                           ADL either performed or assessed with clinical judgement   ADL Overall ADL's : Needs assistance/impaired     Grooming: Sitting;Set up;Wash/dry face                                      Extremity/Trunk Assessment Upper Extremity Assessment RUE Deficits / Details: RA deformities to hand RUE: Shoulder pain with ROM RUE Coordination: decreased fine motor LUE Deficits / Details: RA deformities to the hand            Vision Baseline Vision/History: 0 No visual deficits Patient Visual Report: Diplopia;No change from baseline Vision Assessment?: Yes Eye Alignment: Impaired (comment) Ocular Range of Motion: Within Functional Limits Alignment/Gaze Preference: Within Defined Limits Tracking/Visual Pursuits: Impaired - to be further tested in functional context (consistent blinking, unable to maintain gaze throughout all of tracking.) Convergence: Within functional limits Visual Fields: No apparent deficits Diplopia Assessment: Objects  split side to side;Present all the time/all directions;Other (comment) (disappears in peripheral fields with one eye occluded) Additional Comments: Educated pt on Lens occlusion, taped medial aspect of glasses over L lens. Pt no longer with c/o diplopia and identify only 1 pencil in therapists hands throughout remainder of session. Provided handout.   Perception  Perception Perception: Within Functional Limits   Praxis Praxis Praxis: WFL   Communication Communication Communication: No apparent difficulties   Cognition Arousal: Alert Behavior During Therapy: WFL for tasks assessed/performed Cognition: No apparent impairments                               Following commands: Intact        Cueing   Cueing Techniques: Verbal cues  Exercises Other Exercises Other Exercises: Educated pt on counter pressure exercises.    Shoulder Instructions       General Comments BP supine 114/85, EOB 102/79, standing (with counterpressure exercises) 100/80    Pertinent Vitals/ Pain       Pain Assessment Pain Assessment: Faces Faces Pain Scale: Hurts little more Pain Location: back and bilat LEs Pain Descriptors / Indicators: Aching Pain Intervention(s): Limited activity within patient's tolerance, Monitored during session, Repositioned  Home Living                                          Prior Functioning/Environment              Frequency  Min 2X/week        Progress Toward Goals  OT Goals(current goals can now be found in the care plan section)  Progress towards OT goals: Progressing toward goals  Acute Rehab OT Goals Patient Stated Goal: REturn home OT Goal Formulation: With patient Time For Goal Achievement: 08/23/23 Potential to Achieve Goals: Fair ADL Goals Additional ADL Goal #1: Pt will demonstrate independent use of counter pressure exercises PRN to reduce onset of orthostatic symptoms Additional ADL Goal #2: Pt will demonstrate independent management of Lens occluded glasses to reduce diplopia symptoms  Plan      Co-evaluation                 AM-PAC OT 6 Clicks Daily Activity     Outcome Measure   Help from another person eating meals?: A Little Help from another person taking care of personal grooming?: A Little Help from another person toileting, which includes using  toliet, bedpan, or urinal?: A Lot Help from another person bathing (including washing, rinsing, drying)?: A Lot Help from another person to put on and taking off regular upper body clothing?: A Lot Help from another person to put on and taking off regular lower body clothing?: A Lot 6 Click Score: 14    End of Session Equipment Utilized During Treatment: Rolling walker (2 wheels);Gait belt  OT Visit Diagnosis: Unsteadiness on feet (R26.81);Muscle weakness (generalized) (M62.81);History of falling (Z91.81);Pain Pain - Right/Left: Left Pain - part of body: Shoulder   Activity Tolerance Patient tolerated treatment well   Patient Left in bed;with call bell/phone within reach;with bed alarm set   Nurse Communication Mobility status        Time: 1610-9604 OT Time Calculation (min): 32 min  Charges: OT General Charges $OT Visit: 1 Visit OT Treatments $Therapeutic Activity: 23-37 mins  08/12/2023  AB, OTR/L  Acute Rehabilitation Services  Office: [phone]   Jorene New 08/12/2023, 6:00 PM

## 2023-08-13 DIAGNOSIS — R404 Transient alteration of awareness: Secondary | ICD-10-CM | POA: Diagnosis not present

## 2023-08-13 DIAGNOSIS — M545 Low back pain, unspecified: Secondary | ICD-10-CM | POA: Diagnosis not present

## 2023-08-13 DIAGNOSIS — Z23 Encounter for immunization: Secondary | ICD-10-CM | POA: Diagnosis present

## 2023-08-13 DIAGNOSIS — Z7401 Bed confinement status: Secondary | ICD-10-CM | POA: Diagnosis not present

## 2023-08-13 DIAGNOSIS — I951 Orthostatic hypotension: Secondary | ICD-10-CM | POA: Diagnosis not present

## 2023-08-13 DIAGNOSIS — E640 Sequelae of protein-calorie malnutrition: Secondary | ICD-10-CM | POA: Diagnosis not present

## 2023-08-13 DIAGNOSIS — G43709 Chronic migraine without aura, not intractable, without status migrainosus: Secondary | ICD-10-CM | POA: Diagnosis not present

## 2023-08-13 DIAGNOSIS — R1312 Dysphagia, oropharyngeal phase: Secondary | ICD-10-CM | POA: Diagnosis not present

## 2023-08-13 DIAGNOSIS — Z743 Need for continuous supervision: Secondary | ICD-10-CM | POA: Diagnosis not present

## 2023-08-13 DIAGNOSIS — K219 Gastro-esophageal reflux disease without esophagitis: Secondary | ICD-10-CM | POA: Diagnosis not present

## 2023-08-13 DIAGNOSIS — G894 Chronic pain syndrome: Secondary | ICD-10-CM | POA: Diagnosis not present

## 2023-08-13 DIAGNOSIS — E119 Type 2 diabetes mellitus without complications: Secondary | ICD-10-CM | POA: Diagnosis not present

## 2023-08-13 DIAGNOSIS — R55 Syncope and collapse: Secondary | ICD-10-CM | POA: Diagnosis not present

## 2023-08-13 DIAGNOSIS — E782 Mixed hyperlipidemia: Secondary | ICD-10-CM | POA: Diagnosis not present

## 2023-08-13 DIAGNOSIS — G35 Multiple sclerosis: Secondary | ICD-10-CM | POA: Diagnosis not present

## 2023-08-13 DIAGNOSIS — M6281 Muscle weakness (generalized): Secondary | ICD-10-CM | POA: Diagnosis not present

## 2023-08-13 DIAGNOSIS — R519 Headache, unspecified: Secondary | ICD-10-CM | POA: Diagnosis not present

## 2023-08-13 DIAGNOSIS — E2740 Unspecified adrenocortical insufficiency: Secondary | ICD-10-CM | POA: Diagnosis not present

## 2023-08-13 DIAGNOSIS — G8190 Hemiplegia, unspecified affecting unspecified side: Secondary | ICD-10-CM | POA: Diagnosis not present

## 2023-08-13 DIAGNOSIS — M1990 Unspecified osteoarthritis, unspecified site: Secondary | ICD-10-CM | POA: Diagnosis not present

## 2023-08-13 LAB — GLUCOSE, CAPILLARY
Glucose-Capillary: 168 mg/dL — ABNORMAL HIGH (ref 70–99)
Glucose-Capillary: 238 mg/dL — ABNORMAL HIGH (ref 70–99)

## 2023-08-13 LAB — COMPREHENSIVE METABOLIC PANEL WITH GFR
ALT: 14 U/L (ref 0–44)
AST: 16 U/L (ref 15–41)
Albumin: 2.8 g/dL — ABNORMAL LOW (ref 3.5–5.0)
Alkaline Phosphatase: 65 U/L (ref 38–126)
Anion gap: 12 (ref 5–15)
BUN: 23 mg/dL — ABNORMAL HIGH (ref 6–20)
CO2: 19 mmol/L — ABNORMAL LOW (ref 22–32)
Calcium: 9.2 mg/dL (ref 8.9–10.3)
Chloride: 105 mmol/L (ref 98–111)
Creatinine, Ser: 0.96 mg/dL (ref 0.44–1.00)
GFR, Estimated: >60 mL/min (ref >60)
Glucose, Bld: 161 mg/dL — ABNORMAL HIGH (ref 70–99)
Potassium: 3.9 mmol/L (ref 3.5–5.1)
Sodium: 136 mmol/L (ref 135–145)
Total Bilirubin: 0.3 mg/dL (ref 0.0–1.2)
Total Protein: 7.3 g/dL (ref 6.5–8.1)

## 2023-08-13 LAB — PHOSPHORUS: Phosphorus: 4.1 mg/dL (ref 2.5–4.6)

## 2023-08-13 LAB — CBC WITH DIFFERENTIAL/PLATELET
Abs Immature Granulocytes: 0.04 10*3/uL (ref 0.00–0.07)
Basophils Absolute: 0 10*3/uL (ref 0.0–0.1)
Basophils Relative: 1 %
Eosinophils Absolute: 0.3 10*3/uL (ref 0.0–0.5)
Eosinophils Relative: 5 %
HCT: 43.4 % (ref 36.0–46.0)
Hemoglobin: 14.2 g/dL (ref 12.0–15.0)
Immature Granulocytes: 1 %
Lymphocytes Relative: 35 %
Lymphs Abs: 2.4 10*3/uL (ref 0.7–4.0)
MCH: 29 pg (ref 26.0–34.0)
MCHC: 32.7 g/dL (ref 30.0–36.0)
MCV: 88.6 fL (ref 80.0–100.0)
Monocytes Absolute: 0.8 10*3/uL (ref 0.1–1.0)
Monocytes Relative: 11 %
Neutro Abs: 3.4 10*3/uL (ref 1.7–7.7)
Neutrophils Relative %: 47 %
Platelets: 177 10*3/uL (ref 150–400)
RBC: 4.9 MIL/uL (ref 3.87–5.11)
RDW: 13.6 % (ref 11.5–15.5)
WBC: 7 10*3/uL (ref 4.0–10.5)
nRBC: 0 % (ref 0.0–0.2)

## 2023-08-13 LAB — ACTH STIMULATION, 3 TIME POINTS
Cortisol, 30 Min: 8.3 ug/dL
Cortisol, 60 Min: 9.4 ug/dL
Cortisol, Base: 9.4 ug/dL

## 2023-08-13 LAB — MAGNESIUM: Magnesium: 1.7 mg/dL (ref 1.7–2.4)

## 2023-08-13 MED ORDER — HYDROCORTISONE 5 MG PO TABS: ORAL_TABLET | ORAL | Status: AC

## 2023-08-13 MED ORDER — HYDROCORTISONE 5 MG PO TABS
5.0000 mg | ORAL_TABLET | Freq: Every evening | ORAL | Status: DC
  Filled 2023-08-13: qty 1

## 2023-08-13 MED ORDER — HYDROCORTISONE 10 MG PO TABS
10.0000 mg | ORAL_TABLET | Freq: Every day | ORAL | Status: DC
  Administered 2023-08-13: 10 mg via ORAL
  Filled 2023-08-13: qty 1

## 2023-08-13 MED ORDER — TRESIBA FLEXTOUCH 100 UNIT/ML ~~LOC~~ SOPN
35.0000 [IU] | PEN_INJECTOR | Freq: Every day | SUBCUTANEOUS | Status: DC
Start: 2023-08-13 — End: 2023-12-03

## 2023-08-13 MED ORDER — INSULIN ASPART 100 UNIT/ML IJ SOLN: 0.0000 [IU] | Freq: Three times a day (TID) | INTRAMUSCULAR | Status: DC

## 2023-08-13 NOTE — Discharge Summary (Signed)
 Physician Discharge Summary  Andrea Wade MWU:[id number] DOB: 1970/08/02 DOA: 08/08/2023  PCP: Clydene Darner, PA-C  Admit date: 08/08/2023 Discharge date: 08/13/2023  Time spent: 40 minutes  Recommendations for Outpatient Follow-up:  Follow outpatient CBC/CMP  Newly diagnosed adrenal insufficiency, follow symptoms on hydrocortisone - needs endocrinology follow up Needs neurology follow up for her MS - also with progressive generalized weakness, not clearly explained by imaging Continue compression stockings for orthostasis - elevate head of bed - follow with newly started steroids, hopefully will see improvement Follow blood sugars outpatient, adjust insulin  regimen as needed Abnormal T/L spine imaging, follow with nsgy outpatient  Sending TSH on day of discharge, if not collected or not done, would follow thyroid  studies as outpatient and treat as indicated   Discharge Diagnoses:  Principal Problem:   Syncope, vasovagal Active Problems:   Multiple sclerosis (HCC)   Tobacco abuse   DM2 (diabetes mellitus, type 2) (HCC)   Protein-calorie malnutrition, severe (HCC)   Seizure (HCC)   Chronic pain syndrome  Discharge Condition: stable  Diet recommendation: heart healthy, diabetic  Filed Weights   08/08/23 1459 08/08/23 1518  Weight: 92.2 kg 92.5 kg    History of present illness:   52/F with history of multiple sclerosis, seizure disorder on Depakote  and Topamax , headaches, chronic migraines, chronic pain, narcotic dependence, type 2 diabetes mellitus, arthritis, chronic back pain was brought to the ED as Andrea Wade code stroke for left-sided weakness.  She's been seen by neurology who notes her syncope likely related to her orthostasis.  She has generalized weakness and notable lower extremity weakness, suspect related to deconditioning and her chronic illness.  She'll need to follow with neurology outpatient.  Should follow with nsgy for T/L spine imaging follow up.    Notably, found  to have adrenal insufficiency.  Started on hydrocortisone on day of discharge.  She'll need endo follow up (has endocrinologist it appears).   She'll need close follow up outpatient.    Hospital Course:  Assessment and Plan:  Syncope Orthostatic Hypotension Acute on chronic worsening functional deficits -Difficult to tease out how much of her deficits are new, considering underlying multiple sclerosis with moderate bilateral lower extremity weakness and mobility deficits at baseline, some limitation from pain after her fall as well - seen by neurology, 6/9 thought syncope related to orthostasis  - MRI brain negative for acute CVA - MRI T/L spine without findings that I think would explain global weakness - Discussed informally with nsgy, will have her follow outpatient.   - she was walking with walker at home, now requiring 2 person mod Ras Kollman for sit to stand and transfers with RW - she notes each time she falls, weakness gets worse - ? Deconditioning in setting of her chronic illness - recurrent orthostasis 6/11, SBP to 80's  - compression stockings, I don't think she'd tolerate abdominal binder with her current abdominal discomfort - elevate HOB (she spends most time at home in bed, likely worsening orthostasis) - hydrocortisone as below for adrenal insufficency - lisinopril  has been stopped - follow with neurology outpatient  - PT OT eval completed, SNF recommended, plan for short-term rehab -Follow-up with neurologist in [facility]   Adrenal Insufficiency - could explain orthostatic hypotension above, has been started on hydrocortisone 10 mg qam, 5 mg qpm (adjust prn) - needs endocrinology follow up   Abdominal Pain Follow CT abd/pelvis - mild fullness of L distal ureter UA 6/9 not particularly impressive   Multiple sclerosis (HCC) Chronic pain -  Resume home regimen of antispasmodics and oxycodone  and fentanyl  patch -Imaging of knee and thoracic spine without fractures -Needs close  follow-up with her neurologist in [place]   Hypertension Lisinopril  discontinued with orthostatics    DM2 (diabetes mellitus, type 2) (HCC) Glargine dose increased to 35 units Will send with SSI as well   H/o  Seizures  - Continue home dose of Topamax  and Depakote , Depakote  level was 57   Mild thrombocytopenia - resolved today   Tobacco abuse Encourage cessation   Protein-calorie malnutrition, severe (HCC) Obesity Body mass index is 30.13 kg/m.    Procedures: Echo IMPRESSIONS     1. Left ventricular ejection fraction, by estimation, is 60 to 65%. The  left ventricle has normal function. The left ventricle has no regional  wall motion abnormalities. Left ventricular diastolic parameters are  indeterminate.   2. Right ventricular systolic function is normal. The right ventricular  size is normal.   3. The mitral valve is normal in structure. No evidence of mitral valve  regurgitation. No evidence of mitral stenosis.   4. The aortic valve is normal in structure. Aortic valve regurgitation is  not visualized. No aortic stenosis is present.   5. The inferior vena cava is normal in size with greater than 50%  respiratory variability, suggesting right atrial pressure of 3 mmHg.    Consultations: neurology  Discharge Exam: Vitals:   08/13/23 0803 08/13/23 1116  BP: 127/84 122/83  Pulse: 89 88  Resp: 16 16  Temp: 99.5 F (37.5 C) (!) 97.4 F (36.3 C)  SpO2: 95% 94%   Her current complaints are chronic Nothing significantly different than her normal  General: No acute distress. Cardiovascular: RRR Lungs: unlabored Abdomen: Soft, nondistended, mild discomfort (but less than on the first day I met her) Neurological: Alert and oriented 3. Global weakness, worse in legs, difficulty clearing legs off bed. Cranial nerves II through XII grossly intact. Extremities: No clubbing or cyanosis. No edema.  Discharge Instructions   Discharge Instructions      Ambulatory referral to Endocrinology   Complete by: As directed    Call MD for:  difficulty breathing, headache or visual disturbances   Complete by: As directed    Call MD for:  extreme fatigue   Complete by: As directed    Call MD for:  hives   Complete by: As directed    Call MD for:  persistant dizziness or light-headedness   Complete by: As directed    Call MD for:  persistant nausea and vomiting   Complete by: As directed    Call MD for:  redness, tenderness, or signs of infection (pain, swelling, redness, odor or green/yellow discharge around incision site)   Complete by: As directed    Call MD for:  severe uncontrolled pain   Complete by: As directed    Call MD for:  temperature >100.4   Complete by: As directed    Diet - low sodium heart healthy   Complete by: As directed    Discharge instructions   Complete by: As directed    You were seen after being found down with concern for Rotunda Worden stroke.   After evaluation, we think your episode was probably related to fainting (syncope) due to Ionia Schey low blood pressure (orthostatic hypotension).  You should wear compression stockings (especially when you plan to get up and about).  The head of your bed should be elevated.  I hope the steroids we're starting below will be helpful.  You have adrenal insufficiency (stress hormone deficiency).  So we'll start you on steroids chronically.  We'll start you on hydrocortisone twice Lynzi Meulemans day (10 mg in the morning and 5 mg at night).  You should see the endocrinologists as an outpatient.  I'll place Jalea Bronaugh referral.  I'm hopeful that these steroids will help with some of your chronic symptoms.  I'd also like to check your thyroid  hormone.  There's [facility] lab pending at the time of discharge.  You should follow this up with your outpatient providers.    You should follow up with neurology outpatient.   You have weakness that I think is related to deconditioning in the setting of your fall and being bed bound since  being in the hospital.    Your MRI brain showed no acute findings.  The MRI of your lumbar and thoracic spine showed some disc protrusions/disc bulges.  It's possible you could have some symptoms related to this, but I don't think it would explain your profound weakness.  You can follow up with the neurosurgeons outpatient with regards to this.    Return for new, recurrent, or worsening symptoms.  Please ask your PCP to request records from this hospitalization so they know what was done and what the next steps will be.   Increase activity slowly   Complete by: As directed       Allergies as of 08/13/2023       Reactions   Codeine Hives, Itching, Palpitations   Peanut-containing Drug Products Anaphylaxis, Itching   Aspirin  Itching   High doses only-baby is ok   Corn-containing Products Rash   Pecan Extract Itching   Penicillins Hives, Itching, Nausea And Vomiting   Allergy from childhood Has patient had Jylian Pappalardo PCN reaction causing immediate rash, facial/tongue/throat swelling, SOB or lightheadedness with hypotension: Yes Has patient had Zema Lizardo PCN reaction causing severe rash involving mucus membranes or skin necrosis: Unk Has patient had Rayshad Riviello PCN reaction that required hospitalization: Unk Has patient had Florice Hindle PCN reaction occurring within the last 10 years: No If all of the above answers are NO, then may proceed with Cephalosporin use.   Shellfish-derived Products Rash, Swelling   Tape Other (See Comments)   Slight irritation Other reaction(s): Other (See Comments) Slight irritation        Medication List     STOP taking these medications    Baqsimi One Pack 3 MG/DOSE Powd Generic drug: Glucagon   buPROPion  150 MG 24 hr tablet Commonly known as: WELLBUTRIN  XL   EPINEPHrine  0.3 mg/0.3 mL Soaj injection Commonly known as: EPI-PEN   Ozempic (0.25 or 0.5 MG/DOSE) 2 MG/3ML Sopn Generic drug: Semaglutide(0.25 or 0.5MG /DOS)       TAKE these medications    aspirin  EC 81 MG  tablet Take 1 tablet (81 mg total) by mouth daily. Swallow whole.   baclofen  20 MG tablet Commonly known as: LIORESAL  Take 20 mg by mouth 3 (three) times daily.   dicyclomine  10 MG capsule Commonly known as: BENTYL  Take 10 mg by mouth daily as needed for spasms.   divalproex  500 MG 24 hr tablet Commonly known as: DEPAKOTE  ER Take 500 mg by mouth at bedtime.   DULoxetine  60 MG capsule Commonly known as: CYMBALTA  Take 120 mg by mouth daily.   esomeprazole 40 MG capsule Commonly known as: NEXIUM Take 40 mg by mouth daily.   fentaNYL  25 MCG/HR Commonly known as: DURAGESIC  1 patch every 3 (three) days.   folic acid  1 MG tablet  Commonly known as: FOLVITE  Take 1 mg by mouth daily.   hydrocortisone 5 MG tablet Commonly known as: CORTEF Take 2 tablets (10 mg total) by mouth daily AND 1 tablet (5 mg total) every evening. Follow up with endocrinology outpatient for refills.   insulin  aspart 100 UNIT/ML injection Commonly known as: novoLOG  Inject 0-9 Units into the skin 3 (three) times daily with meals. CBG < 70: treat for low blood sugar CBG 70 - 120: 0 units CBG 121 - 150: 1 unit CBG 151 - 200: 2 units CBG 201 - 250: 3 units CBG 251 - 300: 5 units CBG 301 - 350: 7 units CBG 351 - 400: 9 units CBG > 400: call MD   nortriptyline  75 MG capsule Commonly known as: PAMELOR  Take 75 mg by mouth at bedtime.   ondansetron  8 MG disintegrating tablet Commonly known as: ZOFRAN -ODT Take 8 mg by mouth 3 (three) times daily as needed for nausea.   oxyCODONE  5 MG immediate release tablet Commonly known as: Oxy IR/ROXICODONE  Take 5 mg by mouth every 6 (six) hours as needed for moderate pain (pain score 4-6).   rosuvastatin  5 MG tablet Commonly known as: CRESTOR  Take 1 tablet by mouth daily.   Synjardy XR 11-998 MG Tb24 Generic drug: Empagliflozin-metFORMIN  HCl ER Take 1 tablet by mouth daily.   topiramate  200 MG tablet Commonly known as: TOPAMAX  Take 200 mg by mouth 2 (two)  times daily.   Tresiba  FlexTouch 100 UNIT/ML FlexTouch Pen Generic drug: insulin  degludec Inject 35 Units into the skin daily. What changed:  how much to take when to take this reasons to take this       Allergies  Allergen Reactions   Codeine Hives, Itching and Palpitations   Peanut-Containing Drug Products Anaphylaxis and Itching   Aspirin  Itching    High doses only-baby is ok   Corn-Containing Products Rash   Pecan Extract Itching   Penicillins Hives, Itching and Nausea And Vomiting    Allergy from childhood Has patient had Fletcher Ostermiller PCN reaction causing immediate rash, facial/tongue/throat swelling, SOB or lightheadedness with hypotension: Yes Has patient had Urban Naval PCN reaction causing severe rash involving mucus membranes or skin necrosis: Unk Has patient had Jazmon Kos PCN reaction that required hospitalization: Unk Has patient had Tushar Enns PCN reaction occurring within the last 10 years: No If all of the above answers are NO, then may proceed with Cephalosporin use.     Shellfish-Derived Products Rash and Swelling   Tape Other (See Comments)    Slight irritation Other reaction(s): Other (See Comments) Slight irritation    Contact information for follow-up providers     [facility] Follow up.   Specialty: Endocrinology Why: I referred you to the endocrinologist for your adrenal insufficiency.  If you don't get Gissella Niblack phone call, please call them.  If you have Channon Brougher primary endocrinologist, follow with them. Contact information: [address], [address] [place]  [address] [phone]        O'Buch, Lang Pipes, PA-C Follow up.   Specialty: Internal Medicine Why: call for follow up Contact information: [address] [place]  [place] [address] [phone]         [facility] Follow up.   Specialty: Neurosurgery Why: call for follow up of the abnormal imaging of your spine Contact information: [address] [place] [address] [phone]         Lula Sale, PA-C Follow up.  Specialty: Internal Medicine Why: Call for follow up for your new diagnosis of adrenal insufficiency with endocrinologist. Contact information: [address] [place] [address] [phone]         Delmas Ferrari, Egbert Grass, PA-C Follow up.   Specialty: General Practice Why: Call for neurology follow up Contact information: [address] [place] [address] [phone]              Contact information for after-discharge care     Destination     [facility] Aaron Aas   Service: Skilled Nursing Contact information: [address] [place]  [phone]                      The results of significant diagnostics from this hospitalization (including imaging, microbiology, ancillary and laboratory) are listed below for reference.    Significant Diagnostic Studies: MR Lumbar Spine W Wo Contrast Result Date: 08/12/2023 CLINICAL DATA:  Initial evaluation for acute lower extremity weakness. EXAM: MRI LUMBAR SPINE WITHOUT AND WITH CONTRAST TECHNIQUE: Multiplanar and multiecho pulse sequences of the lumbar spine were obtained without and with intravenous contrast. CONTRAST:  9mL GADAVIST  GADOBUTROL  1 MMOL/ML IV SOLN COMPARISON:  Prior MRI from 03/04/2021 FINDINGS: Segmentation: Standard. Lowest well-formed disc space labeled the L5-S1 level. Alignment: Physiologic with preservation of the normal lumbar lordosis. No listhesis. Vertebrae: Vertebral body height maintained without acute or chronic fracture. Bone marrow signal intensity within normal limits. No worrisome osseous lesions. No abnormal marrow edema or enhancement. Conus medullaris and cauda equina: Conus extends to the L1 level. Conus and cauda equina appear normal. Paraspinal and other soft tissues: Unremarkable. Disc levels: L1-2: Negative  interspace. No spinal stenosis. Foramina remain patent. L2-3: Disc desiccation with mild diffuse disc bulge. Superimposed left foraminal to extraforaminal disc protrusion closely approximates the exiting left L2 nerve root. Mild to moderate left with mild right facet hypertrophy. No significant spinal stenosis. Mild to moderate left L2 foraminal narrowing. Right neural foramen remains patent. L3-4: Disc desiccation with diffuse disc bulge. Superimposed broad-based right foraminal to extraforaminal disc protrusion closely approximates the exiting right L3 nerve root. Mild bilateral facet hypertrophy. No significant spinal stenosis. Mild left with mild-to-moderate right L3 foraminal stenosis. L4-5: Disc desiccation with diffuse disc bulge. Superimposed left subarticular to foraminal disc protrusion (series 4, image 34). Either the left L4 or descending L5 nerve roots could be affected. Superimposed mild facet hypertrophy. Resultant mild canal with severe left lateral recess stenosis. Moderate left with mild right L4 foraminal narrowing. L5-S1: Normal interspace. Mild to moderate left greater than right facet hypertrophy. No spinal stenosis. Foramina remain patent. IMPRESSION: 1. No acute abnormality within the lumbar spine. 2. Left subarticular to foraminal disc protrusion at L4-5 with resultant mild canal and severe left lateral recess stenosis. Either the left L4 or descending L5 nerve roots could be affected. 3. Broad-based right foraminal to extraforaminal disc protrusion at L3-4. The exiting right L3 nerve root could be affected. 4. Left foraminal to extraforaminal disc protrusion at L2-3, potentially affecting the exiting left L2 nerve root. Electronically Signed   By: Virgia Griffins M.D.   On: 08/12/2023 22:37   MR THORACIC SPINE W WO CONTRAST Result Date: 08/12/2023 CLINICAL DATA:  Initial evaluation for acute lower extremity weakness. EXAM: MRI THORACIC WITHOUT AND WITH CONTRAST TECHNIQUE:  Multiplanar and multiecho pulse sequences of the thoracic spine were obtained without and with intravenous contrast. CONTRAST:  9mL GADAVIST  GADOBUTROL  1 MMOL/ML  IV SOLN COMPARISON:  None Available. FINDINGS: Alignment: Vertebral bodies normally aligned with preservation of the normal thoracic kyphosis. No listhesis. Vertebrae: Mild chronic height loss noted at the superior endplates of T1 and T2. Vertebral body height otherwise maintained with no other acute or recent fracture. Bone marrow signal intensity within normal limits. Few scattered benign hemangiomata noted. No worrisome osseous lesions. No abnormal marrow edema or enhancement. Cord:  Normal signal and morphology.  No abnormal enhancement. Paraspinal and other soft tissues: Paraspinous soft tissues demonstrate no acute finding. Disc levels: T1-2: Normal interspace. Mild right-sided facet hypertrophy. No stenosis. T2-3: Mild disc bulge. Mild bilateral facet hypertrophy. No spinal stenosis. Moderate left foraminal narrowing. Right neural foramina remains patent. T3-4: Disc bulge with small right paracentral disc protrusion. Mild bilateral facet hypertrophy. No spinal stenosis. Foramina remain patent. T4-5: Mild disc bulge. Mild lateral facet hypertrophy. No significant stenosis. T5-6: Small left paracentral disc protrusion indents the ventral thecal sac. Mild right-sided facet hypertrophy. No spinal stenosis. Foramina remain patent. T6-7: Small left paracentral disc protrusion indents the left ventral thecal sac. No significant spinal stenosis. Foramina remain patent. T7-8: Tiny right paracentral disc protrusion minimally flattens the right ventral thecal sac. No spinal stenosis. Foramina remain patent. T8-9: Normal interspace. Mild right greater than left facet hypertrophy. No stenosis. T9-10: Disc desiccation with minimal annular disc bulge. Mild facet hypertrophy. No spinal stenosis. Foramina remain patent. T10-11: Disc desiccation with minimal disc  bulge. Small posterior annular fissure. Mild bilateral facet hypertrophy. No spinal stenosis. Foramina remain patent. T11-12: Disc desiccation with diffuse disc bulge. Superimposed small left paracentral disc protrusion flattens the left ventral thecal sac. Slight superior migration. Mild facet hypertrophy. No significant spinal stenosis. Foramina remain patent. T12-L1: Tiny central disc protrusion with annular fissure. Mild bilateral facet hypertrophy. No spinal stenosis. Foramina remain patent. IMPRESSION: 1. Normal MRI appearance of the thoracic spinal cord. No acute abnormality. 2. Mild multilevel thoracic spondylosis with small disc protrusions at T3-4 through T12-L1 as above. No significant spinal stenosis. 3. Moderate left foraminal narrowing at T2-3 related to disc bulge and facet hypertrophy. No other significant foraminal encroachment within the thoracic spine. Electronically Signed   By: Virgia Griffins M.D.   On: 08/12/2023 22:27   CT ABDOMEN PELVIS W CONTRAST Result Date: 08/11/2023 CLINICAL DATA:  Acute abdominal pain EXAM: CT ABDOMEN AND PELVIS WITH CONTRAST TECHNIQUE: Multidetector CT imaging of the abdomen and pelvis was performed using the standard protocol following bolus administration of intravenous contrast. RADIATION DOSE REDUCTION: This exam was performed according to the departmental dose-optimization program which includes automated exposure control, adjustment of the mA and/or kV according to patient size and/or use of iterative reconstruction technique. CONTRAST:  75mL OMNIPAQUE  IOHEXOL  350 MG/ML SOLN COMPARISON:  11/30/2022 FINDINGS: Lower chest: Mild atelectatic changes are noted in the right lower lobe. Hepatobiliary: No focal liver abnormality is seen. Status post cholecystectomy. No biliary dilatation. Pancreas: Unremarkable. No pancreatic ductal dilatation or surrounding inflammatory changes. Spleen: Normal in size without focal abnormality. Adrenals/Urinary Tract: Adrenal  glands are within normal limits. Kidneys are well visualize within normal enhancement pattern bilaterally. Punctate upper pole renal stone is again seen on the right. No obstructive changes are seen. The bladder is well distended. The ureters demonstrate minimal fullness in the distal left ureter although no discrete distal ureteral stone is noted. Stomach/Bowel: No obstructive or inflammatory changes of the colon are seen. Scattered fecal material and ingested tablets are noted throughout the colon. Ingested material is noted throughout the stomach. No  obstructive changes are seen in the small bowel. The appendix is well visualized. No inflammatory changes to suggest appendicitis are seen. Vascular/Lymphatic: No significant vascular findings are present. No enlarged abdominal or pelvic lymph nodes. Reproductive: Uterus and bilateral adnexa are unremarkable. Other: No abdominal wall hernia or abnormality. No abdominopelvic ascites. Musculoskeletal: Degenerative changes of lumbar spine are noted. IMPRESSION: No definitive renal or ureteral stones are seen. Mild fullness of the distal left ureter is seen although no discrete stone is noted. No other focal abnormality is noted. Electronically Signed   By: Violeta Grey M.D.   On: 08/11/2023 23:04   ECHOCARDIOGRAM COMPLETE Result Date: 08/09/2023    ECHOCARDIOGRAM REPORT   Patient Name:   CHARLIEGH VASUDEVAN Date of Exam: 08/09/2023 Medical Rec #:  [id number]           Height:       69.0 in Accession #:    [id number]          Weight:       204.0 lb Date of Birth:  Mar 31, 1970            BSA:          2.083 m Patient Age:    52 years            BP:           144/90 mmHg Patient Gender: F                   HR:           77 bpm. Exam Location:  Inpatient Procedure: 2D Echo, Color Doppler and Cardiac Doppler (Both Spectral and Color            Flow Doppler were utilized during procedure). Indications:    Stroke I63.9  History:        Patient has prior history of Echocardiogram  examinations, most                 recent 09/03/2020.  Sonographer:    Hersey Lorenzo RDCS Referring Phys: [phone] PREETHA JOSEPH IMPRESSIONS  1. Left ventricular ejection fraction, by estimation, is 60 to 65%. The left ventricle has normal function. The left ventricle has no regional wall motion abnormalities. Left ventricular diastolic parameters are indeterminate.  2. Right ventricular systolic function is normal. The right ventricular size is normal.  3. The mitral valve is normal in structure. No evidence of mitral valve regurgitation. No evidence of mitral stenosis.  4. The aortic valve is normal in structure. Aortic valve regurgitation is not visualized. No aortic stenosis is present.  5. The inferior vena cava is normal in size with greater than 50% respiratory variability, suggesting right atrial pressure of 3 mmHg. FINDINGS  Left Ventricle: Left ventricular ejection fraction, by estimation, is 60 to 65%. The left ventricle has normal function. The left ventricle has no regional wall motion abnormalities. The left ventricular internal cavity size was normal in size. There is  no left ventricular hypertrophy. Left ventricular diastolic parameters are indeterminate. Right Ventricle: The right ventricular size is normal. No increase in right ventricular wall thickness. Right ventricular systolic function is normal. Left Atrium: Left atrial size was normal in size. Right Atrium: Right atrial size was normal in size. Pericardium: There is no evidence of pericardial effusion. Mitral Valve: The mitral valve is normal in structure. No evidence of mitral valve regurgitation. No evidence of mitral valve stenosis. Tricuspid Valve: The tricuspid valve is normal in structure. Tricuspid valve  regurgitation is not demonstrated. No evidence of tricuspid stenosis. Aortic Valve: The aortic valve is normal in structure. Aortic valve regurgitation is not visualized. No aortic stenosis is present. Pulmonic Valve: The pulmonic valve  was normal in structure. Pulmonic valve regurgitation is not visualized. No evidence of pulmonic stenosis. Aorta: The aortic root is normal in size and structure. Venous: The inferior vena cava is normal in size with greater than 50% respiratory variability, suggesting right atrial pressure of 3 mmHg. IAS/Shunts: No atrial level shunt detected by color flow Doppler.  LEFT VENTRICLE PLAX 2D LVIDd:         5.00 cm   Diastology LVIDs:         3.70 cm   LV e' medial:    4.90 cm/s LV PW:         1.00 cm   LV E/e' medial:  12.3 LV IVS:        1.00 cm   LV e' lateral:   7.18 cm/s LVOT diam:     2.10 cm   LV E/e' lateral: 8.4 LV SV:         47 LV SV Index:   23 LVOT Area:     3.46 cm  RIGHT VENTRICLE            IVC RV S prime:     9.36 cm/s  IVC diam: 1.30 cm TAPSE (M-mode): 1.8 cm LEFT ATRIUM             Index        RIGHT ATRIUM           Index LA diam:        3.30 cm 1.58 cm/m   RA Area:     10.40 cm LA Vol (A2C):   23.3 ml 11.18 ml/m  RA Volume:   22.50 ml  10.80 ml/m LA Vol (A4C):   19.3 ml 9.26 ml/m LA Biplane Vol: 21.8 ml 10.46 ml/m  AORTIC VALVE LVOT Vmax:   75.80 cm/s LVOT Vmean:  52.300 cm/s LVOT VTI:    0.137 m  AORTA Ao Root diam: 3.00 cm Ao Asc diam:  3.40 cm MITRAL VALVE MV Area (PHT): 3.65 cm    SHUNTS MV Decel Time: 208 msec    Systemic VTI:  0.14 m MV E velocity: 60.30 cm/s  Systemic Diam: 2.10 cm MV Adea Geisel velocity: 84.90 cm/s MV E/Xaviar Lunn ratio:  0.71 Arta Lark Electronically signed by Arta Lark Signature Date/Time: 08/09/2023/1:57:38 PM    Final    CT THORACIC SPINE WO CONTRAST Result Date: 08/09/2023 CLINICAL DATA:  Mid back pain EXAM: CT THORACIC SPINE WITHOUT CONTRAST TECHNIQUE: Multidetector CT images of the thoracic were obtained using the standard protocol without intravenous contrast. RADIATION DOSE REDUCTION: This exam was performed according to the departmental dose-optimization program which includes automated exposure control, adjustment of the mA and/or kV according to patient size  and/or use of iterative reconstruction technique. COMPARISON:  None Available. FINDINGS: Alignment: Normal. Vertebrae: Mild chronic height loss at T2 Paraspinal and other soft tissues: Negative. Disc levels: No spinal canal stenosis IMPRESSION: 1. No acute fracture or static subluxation of the thoracic spine. 2. Mild chronic height loss at T2. Electronically Signed   By: Juanetta Nordmann M.D.   On: 08/09/2023 04:06   DG Knee 1-2 Views Right Result Date: 08/09/2023 CLINICAL DATA:  Right knee pain EXAM: RIGHT KNEE - 2 VIEW COMPARISON:  12/16/2022 FINDINGS: Tricompartmental degenerative changes are noted. No joint effusion is seen. No acute  fracture or dislocation is noted. IMPRESSION: Degenerative change without acute abnormality. Electronically Signed   By: Violeta Grey M.D.   On: 08/09/2023 03:18   MR BRAIN WO CONTRAST Result Date: 08/08/2023 CLINICAL DATA:  Acute neurologic deficit EXAM: MRI HEAD WITHOUT CONTRAST TECHNIQUE: Multiplanar, multiecho pulse sequences of the brain and surrounding structures were obtained without intravenous contrast. COMPARISON:  Brain MRI 11/30/2022 FINDINGS: Brain: No acute infarct, mass effect or extra-axial collection. Hemosiderin deposition in the posterior left hemisphere at the site of old infarct. Old right occipital infarct. There is multifocal hyperintense T2-weighted signal within the periventricular and deep white matter. The midline structures are normal. Vascular: Normal flow voids. Skull and upper cervical spine: Normal calvarium and skull base. Visualized upper cervical spine and soft tissues are normal. Sinuses/Orbits:No paranasal sinus fluid levels or advanced mucosal thickening. No mastoid or middle ear effusion. Normal orbits. IMPRESSION: 1. No acute intracranial abnormality. 2. 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 3. Chronic small vessel ischemia. Electronically Signed   By: Juanetta Nordmann M.D.   On: 08/08/2023 20:43   CT ANGIO HEAD NECK W WO CM Result  Date: 08/08/2023 CLINICAL DATA:  Stroke/TIA, determine embolic source. EXAM: CT ANGIOGRAPHY HEAD AND NECK WITH AND WITHOUT CONTRAST TECHNIQUE: Multidetector CT imaging of the head and neck was performed using the standard protocol during bolus administration of intravenous contrast. Multiplanar CT image reconstructions and MIPs were obtained to evaluate the vascular anatomy. Carotid stenosis measurements (when applicable) are obtained utilizing NASCET criteria, using the distal internal carotid diameter as the denominator. RADIATION DOSE REDUCTION: This exam was performed according to the departmental dose-optimization program which includes automated exposure control, adjustment of the mA and/or kV according to patient size and/or use of iterative reconstruction technique. CONTRAST:  75mL OMNIPAQUE  IOHEXOL  350 MG/ML SOLN COMPARISON:  Same day CT head.  CTA head and neck 11/30/2022. FINDINGS: CTA NECK FINDINGS Aortic arch: Standard configuration of the aortic arch. Imaged portion shows no evidence of aneurysm or dissection. No significant stenosis of the major arch vessel origins. Pulmonary arteries: As permitted by contrast timing, there are no filling defects in the visualized pulmonary arteries. Subclavian arteries: The subclavian arteries are patent bilaterally. Right carotid system: No evidence of dissection, stenosis (50% or greater), or occlusion. Left carotid system: No evidence of dissection, stenosis (50% or greater), or occlusion. Minimal atherosclerosis at the carotid bifurcation. Vertebral arteries: Left vertebral artery is dominant. No evidence of dissection, stenosis (50% or greater), or occlusion. Skeleton: No acute or aggressive finding noted. Similar mild irregularity of the T1 and T2 superior endplates. Edentulous maxilla and mandible. Other neck: The visualized airway is patent. No cervical lymphadenopathy by imaging size criteria. Similar subcentimeter level 2A cervical nodes. Subcentimeter right  thyroid  nodule, no follow-up required. Upper chest: Visualized lung apices are clear. Review of the MIP images confirms the above findings CTA HEAD FINDINGS ANTERIOR CIRCULATION: The intracranial ICAs are patent bilaterally. No significant stenosis, proximal occlusion, aneurysm, or vascular malformation. MCAs: The middle cerebral arteries are patent bilaterally. ACAs: The anterior cerebral arteries are patent bilaterally. POSTERIOR CIRCULATION: No significant stenosis, proximal occlusion, aneurysm, or vascular malformation. PCAs: The posterior cerebral arteries are patent bilaterally. Pcomm: Not well visualized. SCAs: The superior cerebellar arteries are patent bilaterally. Basilar artery: Patent AICAs: Patent PICAs: Patent Vertebral arteries: The intracranial vertebral arteries are patent. Venous sinuses: As permitted by contrast timing, patent. Anatomic variants: None Review of the MIP images confirms the above findings IMPRESSION: No large vessel occlusion. No high-grade stenosis,  aneurysm, or dissection of the arteries in the head and neck. Electronically Signed   By: Denny Flack M.D.   On: 08/08/2023 19:15   CT HEAD CODE STROKE WO CONTRAST Result Date: 08/08/2023 CLINICAL DATA:  Code stroke.  Neuro deficit, concern for stroke. EXAM: CT HEAD WITHOUT CONTRAST TECHNIQUE: Contiguous axial images were obtained from the base of the skull through the vertex without intravenous contrast. RADIATION DOSE REDUCTION: This exam was performed according to the departmental dose-optimization program which includes automated exposure control, adjustment of the mA and/or kV according to patient size and/or use of iterative reconstruction technique. COMPARISON:  MRI head 11/30/2022. FINDINGS: Brain: No acute intracranial hemorrhage. Redemonstrated remote infarct in the left temporal occipital lobes. Additional small remote infarct involving the superior right occipital lobe. There is edema at the skull base which slightly  limits the right middle cranial fossa. Within this limitation there is concern for possible loss of gray-white matter differentiation. Nonspecific hypoattenuation in the periventricular and subcortical white matter favored to reflect chronic microvascular ischemic changes. No significant edema, mass effect, or midline shift. The basilar cisterns are patent. Ventricles: The ventricles are normal. Vascular: No hyperdense vessel or unexpected calcification. Skull: No acute or aggressive finding. Orbits: Orbits are symmetric. Sinuses: The visualized paranasal sinuses are clear. Other: Mastoid air cells are clear. ASPECTS [facility] Stroke Program Early CT Score) - Ganglionic level infarction (caudate, lentiform nuclei, internal capsule, insula, M1-M3 cortex): 6 - Supraganglionic infarction (M4-M6 cortex): 3 Total score (0-10 with 10 being normal): 9 IMPRESSION: 1. Right middle cranial fossa is slightly limited by artifact. Within these limitations there is concern for loss of gray-white differentiation within the anterior left temporal lobe concerning for acute infarct. Recommend MRI for further evaluation. 2. 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 3. Chronic microvascular ischemic changes. 4. ASPECTS is 9 These results were communicated to Dr. Doretta Gant At 3:19 pm on 08/08/2023 by text page via the [facility] messaging system. Electronically Signed   By: Denny Flack M.D.   On: 08/08/2023 15:19    Microbiology: No results found for this or any previous visit (from the past 240 hours).   Labs: Basic Metabolic Panel: Recent Labs  Lab 08/08/23 1643 08/09/23 0522 08/10/23 0434 08/11/23 0810 08/12/23 0547 08/13/23 0608  NA 137 137 134* 135 135 136  K 4.1 3.3* 3.8 4.0 3.7 3.9  CL 106 106 104 103 104 105  CO2 19* 20* 21* 21* 23 19*  GLUCOSE 134* 103* 152* 173* 144* 161*  BUN 17 13 14 16  21* 23*  CREATININE 1.13* 1.02* 1.12* 0.99 0.96 0.96  CALCIUM  9.1 8.9 8.8* 9.0 9.1 9.2   MG 1.6*  --   --   --  1.8 1.7  PHOS  --   --   --   --  3.9 4.1   Liver Function Tests: Recent Labs  Lab 08/08/23 1643 08/09/23 0522 08/11/23 0810 08/12/23 0547 08/13/23 0608  AST 27 23 18 18 16   ALT 16 15 17 15 14   ALKPHOS 56 68 71 65 65  BILITOT 0.7 0.5 0.8 0.5 0.3  PROT 7.2 7.0 7.6 7.4 7.3  ALBUMIN 3.1* 3.1* 2.9* 2.7* 2.8*   No results for input(s): LIPASE, AMYLASE in the last 168 hours. No results for input(s): AMMONIA in the last 168 hours. CBC: Recent Labs  Lab 08/08/23 1643 08/09/23 0522 08/11/23 0810 08/12/23 0547 08/13/23 0608  WBC 7.1 6.3 8.0 8.9 7.0  NEUTROABS 2.9  --  3.9 4.8 3.4  HGB 14.1 13.9 15.0 14.3 14.2  HCT 45.2 44.9 45.9 44.3 43.4  MCV 90.6 90.2 87.1 87.9 88.6  PLT 130* 127* 143* 147* 177   Cardiac Enzymes: No results for input(s): CKTOTAL, CKMB, CKMBINDEX, TROPONINI in the last 168 hours. BNP: BNP (last 3 results) No results for input(s): BNP in the last 8760 hours.  ProBNP (last 3 results) No results for input(s): PROBNP in the last 8760 hours.  CBG: Recent Labs  Lab 08/12/23 1116 08/12/23 1652 08/12/23 2347 08/13/23 0624 08/13/23 1117  GLUCAP 178* 140* 151* 168* 238*       Signed:  Donnetta Gains MD.  [facility] 08/13/2023, 12:07 PM

## 2023-08-13 NOTE — Plan of Care (Signed)
 Report called to Ron at facility.  Pt aware that she will be leaving.     Problem: Education: Goal: Knowledge of disease or condition will improve Outcome: Adequate for Discharge Goal: Knowledge of secondary prevention will improve (MUST DOCUMENT ALL) Outcome: Adequate for Discharge Goal: Knowledge of patient specific risk factors will improve (DELETE if not current risk factor) Outcome: Adequate for Discharge   Problem: Ischemic Stroke/TIA Tissue Perfusion: Goal: Complications of ischemic stroke/TIA will be minimized Outcome: Adequate for Discharge   Problem: Coping: Goal: Will verbalize positive feelings about self Outcome: Adequate for Discharge Goal: Will identify appropriate support needs Outcome: Adequate for Discharge   Problem: Health Behavior/Discharge Planning: Goal: Ability to manage health-related needs will improve Outcome: Adequate for Discharge Goal: Goals will be collaboratively established with patient/family Outcome: Adequate for Discharge   Problem: Self-Care: Goal: Ability to participate in self-care as condition permits will improve Outcome: Adequate for Discharge Goal: Verbalization of feelings and concerns over difficulty with self-care will improve Outcome: Adequate for Discharge   Problem: Education: Goal: Ability to describe self-care measures that may prevent or decrease complications (Diabetes Survival Skills Education) will improve Outcome: Adequate for Discharge Goal: Individualized Educational Video(s) Outcome: Adequate for Discharge   Problem: Coping: Goal: Ability to adjust to condition or change in health will improve Outcome: Adequate for Discharge   Problem: Health Behavior/Discharge Planning: Goal: Ability to manage health-related needs will improve Outcome: Adequate for Discharge   Problem: Metabolic: Goal: Ability to maintain appropriate glucose levels will improve Outcome: Adequate for Discharge   Problem: Nutritional: Goal:  Maintenance of adequate nutrition will improve Outcome: Adequate for Discharge Goal: Progress toward achieving an optimal weight will improve Outcome: Adequate for Discharge   Problem: Skin Integrity: Goal: Risk for impaired skin integrity will decrease Outcome: Adequate for Discharge   Problem: Education: Goal: Knowledge of General Education information will improve Description: Including pain rating scale, medication(s)/side effects and non-pharmacologic comfort measures Outcome: Adequate for Discharge   Problem: Health Behavior/Discharge Planning: Goal: Ability to manage health-related needs will improve Outcome: Adequate for Discharge   Problem: Clinical Measurements: Goal: Will remain free from infection Outcome: Adequate for Discharge Goal: Diagnostic test results will improve Outcome: Adequate for Discharge Goal: Respiratory complications will improve Outcome: Adequate for Discharge   Problem: Activity: Goal: Risk for activity intolerance will decrease Outcome: Adequate for Discharge   Problem: Nutrition: Goal: Adequate nutrition will be maintained Outcome: Adequate for Discharge   Problem: Elimination: Goal: Will not experience complications related to bowel motility Outcome: Adequate for Discharge   Problem: Pain Managment: Goal: General experience of comfort will improve and/or be controlled Outcome: Adequate for Discharge   Problem: Safety: Goal: Ability to remain free from injury will improve Outcome: Adequate for Discharge   Problem: Skin Integrity: Goal: Risk for impaired skin integrity will decrease Outcome: Adequate for Discharge

## 2023-08-13 NOTE — Progress Notes (Signed)
 Discharged to SNF after IV access removed.  Transported by [facility].

## 2023-08-13 NOTE — TOC Transition Note (Signed)
 Transition of Care [facility]) - Discharge Note   Patient Details  Name: Andrea Wade MRN: [id number] Date of Birth: [birth year]  [facility] CM/SW Contact:  Tandy Fam, LCSW Phone Number: 08/13/2023, 1:38 PM   Clinical Narrative:   Patient received insurance authorization to admit to [facility], and SNF bed is available today. CSW updated MD, patient stable for discharge. CSW met with patient, she is in agreement. Transport arranged with [facility] for next available.  Nurse to call report to [phone], Room 109    Final next level of care: Skilled Nursing Facility Barriers to Discharge: Barriers Resolved   Patient Goals and CMS Choice Patient states their goals for this hospitalization and ongoing recovery are:: to get better CMS Medicare.gov Compare Post Acute Care list provided to:: Patient Choice offered to / list presented to : Patient [place] ownership interest in [facility].provided to:: Patient    Discharge Placement              Patient chooses bed at: [facility] Patient to be transferred to facility by: [facility] Name of family member notified: Self Patient and family notified of of transfer: 08/13/23  Discharge Plan and Services Additional resources added to the After Visit Summary for       Post Acute Care Choice: Skilled Nursing Facility                               Social Drivers of Health (SDOH) Interventions SDOH Screenings   Food Insecurity: No Food Insecurity (08/09/2023)  Housing: Low Risk  (08/09/2023)  Transportation Needs: No Transportation Needs (08/09/2023)  Utilities: Not At Risk (08/09/2023)  Tobacco Use: Medium Risk (08/08/2023)     Readmission Risk Interventions     No data to display

## 2023-08-13 NOTE — Plan of Care (Signed)
   Problem: Education: Goal: Knowledge of disease or condition will improve Outcome: Progressing Goal: Knowledge of secondary prevention will improve (MUST DOCUMENT ALL) Outcome: Progressing Goal: Knowledge of patient specific risk factors will improve (DELETE if not current risk factor) Outcome: Progressing   Problem: Ischemic Stroke/TIA Tissue Perfusion: Goal: Complications of ischemic stroke/TIA will be minimized Outcome: Progressing

## 2023-08-16 DIAGNOSIS — I951 Orthostatic hypotension: Secondary | ICD-10-CM | POA: Diagnosis not present

## 2023-08-16 DIAGNOSIS — G894 Chronic pain syndrome: Secondary | ICD-10-CM | POA: Diagnosis not present

## 2023-08-16 DIAGNOSIS — E119 Type 2 diabetes mellitus without complications: Secondary | ICD-10-CM | POA: Diagnosis not present

## 2023-08-16 DIAGNOSIS — M545 Low back pain, unspecified: Secondary | ICD-10-CM | POA: Diagnosis not present

## 2023-08-16 DIAGNOSIS — G43709 Chronic migraine without aura, not intractable, without status migrainosus: Secondary | ICD-10-CM | POA: Diagnosis not present

## 2023-08-16 DIAGNOSIS — G35 Multiple sclerosis: Secondary | ICD-10-CM | POA: Diagnosis not present

## 2023-08-16 DIAGNOSIS — M1990 Unspecified osteoarthritis, unspecified site: Secondary | ICD-10-CM | POA: Diagnosis not present

## 2023-08-16 DIAGNOSIS — E2740 Unspecified adrenocortical insufficiency: Secondary | ICD-10-CM | POA: Diagnosis not present

## 2023-09-01 DIAGNOSIS — K5090 Crohn's disease, unspecified, without complications: Secondary | ICD-10-CM | POA: Diagnosis not present

## 2023-09-01 DIAGNOSIS — K219 Gastro-esophageal reflux disease without esophagitis: Secondary | ICD-10-CM | POA: Diagnosis not present

## 2023-09-01 DIAGNOSIS — N183 Chronic kidney disease, stage 3 unspecified: Secondary | ICD-10-CM | POA: Diagnosis not present

## 2023-09-01 DIAGNOSIS — M329 Systemic lupus erythematosus, unspecified: Secondary | ICD-10-CM | POA: Diagnosis not present

## 2023-09-01 DIAGNOSIS — R531 Weakness: Secondary | ICD-10-CM | POA: Diagnosis not present

## 2023-09-01 DIAGNOSIS — Z604 Social exclusion and rejection: Secondary | ICD-10-CM | POA: Diagnosis not present

## 2023-09-01 DIAGNOSIS — E1122 Type 2 diabetes mellitus with diabetic chronic kidney disease: Secondary | ICD-10-CM | POA: Diagnosis not present

## 2023-09-01 DIAGNOSIS — G894 Chronic pain syndrome: Secondary | ICD-10-CM | POA: Diagnosis not present

## 2023-09-01 DIAGNOSIS — G35 Multiple sclerosis: Secondary | ICD-10-CM | POA: Diagnosis not present

## 2023-09-01 DIAGNOSIS — M069 Rheumatoid arthritis, unspecified: Secondary | ICD-10-CM | POA: Diagnosis not present

## 2023-09-01 DIAGNOSIS — I129 Hypertensive chronic kidney disease with stage 1 through stage 4 chronic kidney disease, or unspecified chronic kidney disease: Secondary | ICD-10-CM | POA: Diagnosis not present

## 2023-09-01 DIAGNOSIS — Z791 Long term (current) use of non-steroidal anti-inflammatories (NSAID): Secondary | ICD-10-CM | POA: Diagnosis not present

## 2023-09-01 DIAGNOSIS — I69398 Other sequelae of cerebral infarction: Secondary | ICD-10-CM | POA: Diagnosis not present

## 2023-09-01 DIAGNOSIS — Z9181 History of falling: Secondary | ICD-10-CM | POA: Diagnosis not present

## 2023-09-01 DIAGNOSIS — E785 Hyperlipidemia, unspecified: Secondary | ICD-10-CM | POA: Diagnosis not present

## 2023-09-01 DIAGNOSIS — K5900 Constipation, unspecified: Secondary | ICD-10-CM | POA: Diagnosis not present

## 2023-09-01 DIAGNOSIS — M1990 Unspecified osteoarthritis, unspecified site: Secondary | ICD-10-CM | POA: Diagnosis not present

## 2023-09-04 DIAGNOSIS — I639 Cerebral infarction, unspecified: Secondary | ICD-10-CM | POA: Diagnosis not present

## 2023-09-07 DIAGNOSIS — E1122 Type 2 diabetes mellitus with diabetic chronic kidney disease: Secondary | ICD-10-CM | POA: Diagnosis not present

## 2023-09-07 DIAGNOSIS — M069 Rheumatoid arthritis, unspecified: Secondary | ICD-10-CM | POA: Diagnosis not present

## 2023-09-07 DIAGNOSIS — G35 Multiple sclerosis: Secondary | ICD-10-CM | POA: Diagnosis not present

## 2023-09-07 DIAGNOSIS — E039 Hypothyroidism, unspecified: Secondary | ICD-10-CM | POA: Diagnosis not present

## 2023-09-07 DIAGNOSIS — E2740 Unspecified adrenocortical insufficiency: Secondary | ICD-10-CM | POA: Diagnosis not present

## 2023-09-07 DIAGNOSIS — N183 Chronic kidney disease, stage 3 unspecified: Secondary | ICD-10-CM | POA: Diagnosis not present

## 2023-09-08 DIAGNOSIS — M069 Rheumatoid arthritis, unspecified: Secondary | ICD-10-CM | POA: Diagnosis not present

## 2023-09-08 DIAGNOSIS — I69398 Other sequelae of cerebral infarction: Secondary | ICD-10-CM | POA: Diagnosis not present

## 2023-09-08 DIAGNOSIS — E43 Unspecified severe protein-calorie malnutrition: Secondary | ICD-10-CM | POA: Diagnosis not present

## 2023-09-08 DIAGNOSIS — I129 Hypertensive chronic kidney disease with stage 1 through stage 4 chronic kidney disease, or unspecified chronic kidney disease: Secondary | ICD-10-CM | POA: Diagnosis not present

## 2023-09-08 DIAGNOSIS — D696 Thrombocytopenia, unspecified: Secondary | ICD-10-CM | POA: Diagnosis not present

## 2023-09-08 DIAGNOSIS — K219 Gastro-esophageal reflux disease without esophagitis: Secondary | ICD-10-CM | POA: Diagnosis not present

## 2023-09-08 DIAGNOSIS — I951 Orthostatic hypotension: Secondary | ICD-10-CM | POA: Diagnosis not present

## 2023-09-08 DIAGNOSIS — G35 Multiple sclerosis: Secondary | ICD-10-CM | POA: Diagnosis not present

## 2023-09-08 DIAGNOSIS — K5090 Crohn's disease, unspecified, without complications: Secondary | ICD-10-CM | POA: Diagnosis not present

## 2023-09-08 DIAGNOSIS — D649 Anemia, unspecified: Secondary | ICD-10-CM | POA: Diagnosis not present

## 2023-09-08 DIAGNOSIS — N183 Chronic kidney disease, stage 3 unspecified: Secondary | ICD-10-CM | POA: Diagnosis not present

## 2023-09-08 DIAGNOSIS — M549 Dorsalgia, unspecified: Secondary | ICD-10-CM | POA: Diagnosis not present

## 2023-09-08 DIAGNOSIS — M1990 Unspecified osteoarthritis, unspecified site: Secondary | ICD-10-CM | POA: Diagnosis not present

## 2023-09-08 DIAGNOSIS — G894 Chronic pain syndrome: Secondary | ICD-10-CM | POA: Diagnosis not present

## 2023-09-08 DIAGNOSIS — E1122 Type 2 diabetes mellitus with diabetic chronic kidney disease: Secondary | ICD-10-CM | POA: Diagnosis not present

## 2023-09-08 DIAGNOSIS — K5900 Constipation, unspecified: Secondary | ICD-10-CM | POA: Diagnosis not present

## 2023-09-08 DIAGNOSIS — M329 Systemic lupus erythematosus, unspecified: Secondary | ICD-10-CM | POA: Diagnosis not present

## 2023-09-08 DIAGNOSIS — E2740 Unspecified adrenocortical insufficiency: Secondary | ICD-10-CM | POA: Diagnosis not present

## 2023-09-08 DIAGNOSIS — E785 Hyperlipidemia, unspecified: Secondary | ICD-10-CM | POA: Diagnosis not present

## 2023-09-08 DIAGNOSIS — R531 Weakness: Secondary | ICD-10-CM | POA: Diagnosis not present

## 2023-09-10 DIAGNOSIS — M5416 Radiculopathy, lumbar region: Secondary | ICD-10-CM | POA: Diagnosis not present

## 2023-09-10 DIAGNOSIS — G894 Chronic pain syndrome: Secondary | ICD-10-CM | POA: Diagnosis not present

## 2023-09-15 DIAGNOSIS — I951 Orthostatic hypotension: Secondary | ICD-10-CM | POA: Diagnosis not present

## 2023-09-15 DIAGNOSIS — K219 Gastro-esophageal reflux disease without esophagitis: Secondary | ICD-10-CM | POA: Diagnosis not present

## 2023-09-15 DIAGNOSIS — E43 Unspecified severe protein-calorie malnutrition: Secondary | ICD-10-CM | POA: Diagnosis not present

## 2023-09-15 DIAGNOSIS — K5090 Crohn's disease, unspecified, without complications: Secondary | ICD-10-CM | POA: Diagnosis not present

## 2023-09-15 DIAGNOSIS — G894 Chronic pain syndrome: Secondary | ICD-10-CM | POA: Diagnosis not present

## 2023-09-15 DIAGNOSIS — E2740 Unspecified adrenocortical insufficiency: Secondary | ICD-10-CM | POA: Diagnosis not present

## 2023-09-15 DIAGNOSIS — I129 Hypertensive chronic kidney disease with stage 1 through stage 4 chronic kidney disease, or unspecified chronic kidney disease: Secondary | ICD-10-CM | POA: Diagnosis not present

## 2023-09-15 DIAGNOSIS — N183 Chronic kidney disease, stage 3 unspecified: Secondary | ICD-10-CM | POA: Diagnosis not present

## 2023-09-15 DIAGNOSIS — D696 Thrombocytopenia, unspecified: Secondary | ICD-10-CM | POA: Diagnosis not present

## 2023-09-15 DIAGNOSIS — R531 Weakness: Secondary | ICD-10-CM | POA: Diagnosis not present

## 2023-09-15 DIAGNOSIS — K5900 Constipation, unspecified: Secondary | ICD-10-CM | POA: Diagnosis not present

## 2023-09-15 DIAGNOSIS — E1122 Type 2 diabetes mellitus with diabetic chronic kidney disease: Secondary | ICD-10-CM | POA: Diagnosis not present

## 2023-09-15 DIAGNOSIS — M069 Rheumatoid arthritis, unspecified: Secondary | ICD-10-CM | POA: Diagnosis not present

## 2023-09-15 DIAGNOSIS — M1990 Unspecified osteoarthritis, unspecified site: Secondary | ICD-10-CM | POA: Diagnosis not present

## 2023-09-15 DIAGNOSIS — E785 Hyperlipidemia, unspecified: Secondary | ICD-10-CM | POA: Diagnosis not present

## 2023-09-15 DIAGNOSIS — D649 Anemia, unspecified: Secondary | ICD-10-CM | POA: Diagnosis not present

## 2023-09-15 DIAGNOSIS — M329 Systemic lupus erythematosus, unspecified: Secondary | ICD-10-CM | POA: Diagnosis not present

## 2023-09-15 DIAGNOSIS — I69398 Other sequelae of cerebral infarction: Secondary | ICD-10-CM | POA: Diagnosis not present

## 2023-09-15 DIAGNOSIS — M549 Dorsalgia, unspecified: Secondary | ICD-10-CM | POA: Diagnosis not present

## 2023-09-15 DIAGNOSIS — G35 Multiple sclerosis: Secondary | ICD-10-CM | POA: Diagnosis not present

## 2023-09-17 DIAGNOSIS — Z8739 Personal history of other diseases of the musculoskeletal system and connective tissue: Secondary | ICD-10-CM | POA: Diagnosis not present

## 2023-09-17 DIAGNOSIS — M542 Cervicalgia: Secondary | ICD-10-CM | POA: Diagnosis not present

## 2023-09-17 DIAGNOSIS — G35 Multiple sclerosis: Secondary | ICD-10-CM | POA: Diagnosis not present

## 2023-09-17 DIAGNOSIS — M546 Pain in thoracic spine: Secondary | ICD-10-CM | POA: Diagnosis not present

## 2023-09-22 DIAGNOSIS — M329 Systemic lupus erythematosus, unspecified: Secondary | ICD-10-CM | POA: Diagnosis not present

## 2023-09-22 DIAGNOSIS — M549 Dorsalgia, unspecified: Secondary | ICD-10-CM | POA: Diagnosis not present

## 2023-09-22 DIAGNOSIS — M069 Rheumatoid arthritis, unspecified: Secondary | ICD-10-CM | POA: Diagnosis not present

## 2023-09-22 DIAGNOSIS — D696 Thrombocytopenia, unspecified: Secondary | ICD-10-CM | POA: Diagnosis not present

## 2023-09-22 DIAGNOSIS — E785 Hyperlipidemia, unspecified: Secondary | ICD-10-CM | POA: Diagnosis not present

## 2023-09-22 DIAGNOSIS — M1990 Unspecified osteoarthritis, unspecified site: Secondary | ICD-10-CM | POA: Diagnosis not present

## 2023-09-22 DIAGNOSIS — I129 Hypertensive chronic kidney disease with stage 1 through stage 4 chronic kidney disease, or unspecified chronic kidney disease: Secondary | ICD-10-CM | POA: Diagnosis not present

## 2023-09-22 DIAGNOSIS — E2740 Unspecified adrenocortical insufficiency: Secondary | ICD-10-CM | POA: Diagnosis not present

## 2023-09-22 DIAGNOSIS — E43 Unspecified severe protein-calorie malnutrition: Secondary | ICD-10-CM | POA: Diagnosis not present

## 2023-09-22 DIAGNOSIS — K5900 Constipation, unspecified: Secondary | ICD-10-CM | POA: Diagnosis not present

## 2023-09-22 DIAGNOSIS — N183 Chronic kidney disease, stage 3 unspecified: Secondary | ICD-10-CM | POA: Diagnosis not present

## 2023-09-22 DIAGNOSIS — I951 Orthostatic hypotension: Secondary | ICD-10-CM | POA: Diagnosis not present

## 2023-09-22 DIAGNOSIS — G894 Chronic pain syndrome: Secondary | ICD-10-CM | POA: Diagnosis not present

## 2023-09-22 DIAGNOSIS — K5090 Crohn's disease, unspecified, without complications: Secondary | ICD-10-CM | POA: Diagnosis not present

## 2023-09-22 DIAGNOSIS — K219 Gastro-esophageal reflux disease without esophagitis: Secondary | ICD-10-CM | POA: Diagnosis not present

## 2023-09-22 DIAGNOSIS — I69398 Other sequelae of cerebral infarction: Secondary | ICD-10-CM | POA: Diagnosis not present

## 2023-09-22 DIAGNOSIS — D649 Anemia, unspecified: Secondary | ICD-10-CM | POA: Diagnosis not present

## 2023-09-22 DIAGNOSIS — R531 Weakness: Secondary | ICD-10-CM | POA: Diagnosis not present

## 2023-09-22 DIAGNOSIS — G35 Multiple sclerosis: Secondary | ICD-10-CM | POA: Diagnosis not present

## 2023-09-22 DIAGNOSIS — E1122 Type 2 diabetes mellitus with diabetic chronic kidney disease: Secondary | ICD-10-CM | POA: Diagnosis not present

## 2023-09-23 DIAGNOSIS — E43 Unspecified severe protein-calorie malnutrition: Secondary | ICD-10-CM | POA: Diagnosis not present

## 2023-09-23 DIAGNOSIS — I951 Orthostatic hypotension: Secondary | ICD-10-CM | POA: Diagnosis not present

## 2023-09-23 DIAGNOSIS — D649 Anemia, unspecified: Secondary | ICD-10-CM | POA: Diagnosis not present

## 2023-09-23 DIAGNOSIS — E2740 Unspecified adrenocortical insufficiency: Secondary | ICD-10-CM | POA: Diagnosis not present

## 2023-09-23 DIAGNOSIS — I129 Hypertensive chronic kidney disease with stage 1 through stage 4 chronic kidney disease, or unspecified chronic kidney disease: Secondary | ICD-10-CM | POA: Diagnosis not present

## 2023-09-23 DIAGNOSIS — E1122 Type 2 diabetes mellitus with diabetic chronic kidney disease: Secondary | ICD-10-CM | POA: Diagnosis not present

## 2023-09-23 DIAGNOSIS — K5900 Constipation, unspecified: Secondary | ICD-10-CM | POA: Diagnosis not present

## 2023-09-23 DIAGNOSIS — I69398 Other sequelae of cerebral infarction: Secondary | ICD-10-CM | POA: Diagnosis not present

## 2023-09-23 DIAGNOSIS — M549 Dorsalgia, unspecified: Secondary | ICD-10-CM | POA: Diagnosis not present

## 2023-09-23 DIAGNOSIS — N183 Chronic kidney disease, stage 3 unspecified: Secondary | ICD-10-CM | POA: Diagnosis not present

## 2023-09-23 DIAGNOSIS — D696 Thrombocytopenia, unspecified: Secondary | ICD-10-CM | POA: Diagnosis not present

## 2023-09-23 DIAGNOSIS — M1990 Unspecified osteoarthritis, unspecified site: Secondary | ICD-10-CM | POA: Diagnosis not present

## 2023-09-23 DIAGNOSIS — M329 Systemic lupus erythematosus, unspecified: Secondary | ICD-10-CM | POA: Diagnosis not present

## 2023-09-23 DIAGNOSIS — M069 Rheumatoid arthritis, unspecified: Secondary | ICD-10-CM | POA: Diagnosis not present

## 2023-09-23 DIAGNOSIS — K5090 Crohn's disease, unspecified, without complications: Secondary | ICD-10-CM | POA: Diagnosis not present

## 2023-09-23 DIAGNOSIS — G35 Multiple sclerosis: Secondary | ICD-10-CM | POA: Diagnosis not present

## 2023-09-23 DIAGNOSIS — G894 Chronic pain syndrome: Secondary | ICD-10-CM | POA: Diagnosis not present

## 2023-09-23 DIAGNOSIS — K219 Gastro-esophageal reflux disease without esophagitis: Secondary | ICD-10-CM | POA: Diagnosis not present

## 2023-09-23 DIAGNOSIS — R531 Weakness: Secondary | ICD-10-CM | POA: Diagnosis not present

## 2023-09-23 DIAGNOSIS — E785 Hyperlipidemia, unspecified: Secondary | ICD-10-CM | POA: Diagnosis not present

## 2023-09-29 DIAGNOSIS — E1129 Type 2 diabetes mellitus with other diabetic kidney complication: Secondary | ICD-10-CM | POA: Diagnosis not present

## 2023-09-29 DIAGNOSIS — E2740 Unspecified adrenocortical insufficiency: Secondary | ICD-10-CM | POA: Diagnosis not present

## 2023-09-29 DIAGNOSIS — E1165 Type 2 diabetes mellitus with hyperglycemia: Secondary | ICD-10-CM | POA: Diagnosis not present

## 2023-09-29 DIAGNOSIS — Z794 Long term (current) use of insulin: Secondary | ICD-10-CM | POA: Diagnosis not present

## 2023-09-29 DIAGNOSIS — R809 Proteinuria, unspecified: Secondary | ICD-10-CM | POA: Diagnosis not present

## 2023-10-05 DIAGNOSIS — I639 Cerebral infarction, unspecified: Secondary | ICD-10-CM | POA: Diagnosis not present

## 2023-10-06 DIAGNOSIS — K5090 Crohn's disease, unspecified, without complications: Secondary | ICD-10-CM | POA: Diagnosis not present

## 2023-10-06 DIAGNOSIS — E43 Unspecified severe protein-calorie malnutrition: Secondary | ICD-10-CM | POA: Diagnosis not present

## 2023-10-06 DIAGNOSIS — M069 Rheumatoid arthritis, unspecified: Secondary | ICD-10-CM | POA: Diagnosis not present

## 2023-10-06 DIAGNOSIS — I69398 Other sequelae of cerebral infarction: Secondary | ICD-10-CM | POA: Diagnosis not present

## 2023-10-06 DIAGNOSIS — M329 Systemic lupus erythematosus, unspecified: Secondary | ICD-10-CM | POA: Diagnosis not present

## 2023-10-06 DIAGNOSIS — E2740 Unspecified adrenocortical insufficiency: Secondary | ICD-10-CM | POA: Diagnosis not present

## 2023-10-06 DIAGNOSIS — K219 Gastro-esophageal reflux disease without esophagitis: Secondary | ICD-10-CM | POA: Diagnosis not present

## 2023-10-06 DIAGNOSIS — R531 Weakness: Secondary | ICD-10-CM | POA: Diagnosis not present

## 2023-10-06 DIAGNOSIS — M1990 Unspecified osteoarthritis, unspecified site: Secondary | ICD-10-CM | POA: Diagnosis not present

## 2023-10-06 DIAGNOSIS — D696 Thrombocytopenia, unspecified: Secondary | ICD-10-CM | POA: Diagnosis not present

## 2023-10-06 DIAGNOSIS — I129 Hypertensive chronic kidney disease with stage 1 through stage 4 chronic kidney disease, or unspecified chronic kidney disease: Secondary | ICD-10-CM | POA: Diagnosis not present

## 2023-10-06 DIAGNOSIS — G894 Chronic pain syndrome: Secondary | ICD-10-CM | POA: Diagnosis not present

## 2023-10-06 DIAGNOSIS — M549 Dorsalgia, unspecified: Secondary | ICD-10-CM | POA: Diagnosis not present

## 2023-10-06 DIAGNOSIS — K5900 Constipation, unspecified: Secondary | ICD-10-CM | POA: Diagnosis not present

## 2023-10-06 DIAGNOSIS — E785 Hyperlipidemia, unspecified: Secondary | ICD-10-CM | POA: Diagnosis not present

## 2023-10-06 DIAGNOSIS — E1122 Type 2 diabetes mellitus with diabetic chronic kidney disease: Secondary | ICD-10-CM | POA: Diagnosis not present

## 2023-10-06 DIAGNOSIS — G35 Multiple sclerosis: Secondary | ICD-10-CM | POA: Diagnosis not present

## 2023-10-06 DIAGNOSIS — N183 Chronic kidney disease, stage 3 unspecified: Secondary | ICD-10-CM | POA: Diagnosis not present

## 2023-10-06 DIAGNOSIS — I951 Orthostatic hypotension: Secondary | ICD-10-CM | POA: Diagnosis not present

## 2023-10-06 DIAGNOSIS — D649 Anemia, unspecified: Secondary | ICD-10-CM | POA: Diagnosis not present

## 2023-10-07 DIAGNOSIS — I951 Orthostatic hypotension: Secondary | ICD-10-CM | POA: Diagnosis not present

## 2023-10-07 DIAGNOSIS — G35 Multiple sclerosis: Secondary | ICD-10-CM | POA: Diagnosis not present

## 2023-10-14 DIAGNOSIS — Z79891 Long term (current) use of opiate analgesic: Secondary | ICD-10-CM | POA: Diagnosis not present

## 2023-10-14 DIAGNOSIS — M5416 Radiculopathy, lumbar region: Secondary | ICD-10-CM | POA: Diagnosis not present

## 2023-10-25 DIAGNOSIS — K219 Gastro-esophageal reflux disease without esophagitis: Secondary | ICD-10-CM | POA: Diagnosis not present

## 2023-10-25 DIAGNOSIS — N183 Chronic kidney disease, stage 3 unspecified: Secondary | ICD-10-CM | POA: Diagnosis not present

## 2023-10-25 DIAGNOSIS — E1122 Type 2 diabetes mellitus with diabetic chronic kidney disease: Secondary | ICD-10-CM | POA: Diagnosis not present

## 2023-10-25 DIAGNOSIS — K5090 Crohn's disease, unspecified, without complications: Secondary | ICD-10-CM | POA: Diagnosis not present

## 2023-10-25 DIAGNOSIS — G35 Multiple sclerosis: Secondary | ICD-10-CM | POA: Diagnosis not present

## 2023-10-25 DIAGNOSIS — M1990 Unspecified osteoarthritis, unspecified site: Secondary | ICD-10-CM | POA: Diagnosis not present

## 2023-10-25 DIAGNOSIS — M069 Rheumatoid arthritis, unspecified: Secondary | ICD-10-CM | POA: Diagnosis not present

## 2023-10-25 DIAGNOSIS — D649 Anemia, unspecified: Secondary | ICD-10-CM | POA: Diagnosis not present

## 2023-10-25 DIAGNOSIS — I951 Orthostatic hypotension: Secondary | ICD-10-CM | POA: Diagnosis not present

## 2023-10-25 DIAGNOSIS — D696 Thrombocytopenia, unspecified: Secondary | ICD-10-CM | POA: Diagnosis not present

## 2023-10-25 DIAGNOSIS — M549 Dorsalgia, unspecified: Secondary | ICD-10-CM | POA: Diagnosis not present

## 2023-10-25 DIAGNOSIS — E43 Unspecified severe protein-calorie malnutrition: Secondary | ICD-10-CM | POA: Diagnosis not present

## 2023-10-25 DIAGNOSIS — G894 Chronic pain syndrome: Secondary | ICD-10-CM | POA: Diagnosis not present

## 2023-10-25 DIAGNOSIS — E2740 Unspecified adrenocortical insufficiency: Secondary | ICD-10-CM | POA: Diagnosis not present

## 2023-10-25 DIAGNOSIS — R531 Weakness: Secondary | ICD-10-CM | POA: Diagnosis not present

## 2023-10-25 DIAGNOSIS — I69398 Other sequelae of cerebral infarction: Secondary | ICD-10-CM | POA: Diagnosis not present

## 2023-10-25 DIAGNOSIS — K5900 Constipation, unspecified: Secondary | ICD-10-CM | POA: Diagnosis not present

## 2023-10-25 DIAGNOSIS — E785 Hyperlipidemia, unspecified: Secondary | ICD-10-CM | POA: Diagnosis not present

## 2023-10-25 DIAGNOSIS — M329 Systemic lupus erythematosus, unspecified: Secondary | ICD-10-CM | POA: Diagnosis not present

## 2023-10-25 DIAGNOSIS — I129 Hypertensive chronic kidney disease with stage 1 through stage 4 chronic kidney disease, or unspecified chronic kidney disease: Secondary | ICD-10-CM | POA: Diagnosis not present

## 2023-11-03 DIAGNOSIS — Z79899 Other long term (current) drug therapy: Secondary | ICD-10-CM | POA: Diagnosis not present

## 2023-11-03 DIAGNOSIS — G894 Chronic pain syndrome: Secondary | ICD-10-CM | POA: Diagnosis not present

## 2023-11-03 DIAGNOSIS — M05742 Rheumatoid arthritis with rheumatoid factor of left hand without organ or systems involvement: Secondary | ICD-10-CM | POA: Diagnosis not present

## 2023-11-03 DIAGNOSIS — M2550 Pain in unspecified joint: Secondary | ICD-10-CM | POA: Diagnosis not present

## 2023-11-03 DIAGNOSIS — M05741 Rheumatoid arthritis with rheumatoid factor of right hand without organ or systems involvement: Secondary | ICD-10-CM | POA: Diagnosis not present

## 2023-11-03 DIAGNOSIS — M797 Fibromyalgia: Secondary | ICD-10-CM | POA: Diagnosis not present

## 2023-11-21 DIAGNOSIS — Z794 Long term (current) use of insulin: Secondary | ICD-10-CM | POA: Diagnosis not present

## 2023-11-21 DIAGNOSIS — Z7985 Long-term (current) use of injectable non-insulin antidiabetic drugs: Secondary | ICD-10-CM | POA: Diagnosis not present

## 2023-11-21 DIAGNOSIS — E039 Hypothyroidism, unspecified: Secondary | ICD-10-CM | POA: Diagnosis not present

## 2023-11-21 DIAGNOSIS — Z91040 Latex allergy status: Secondary | ICD-10-CM | POA: Diagnosis not present

## 2023-11-21 DIAGNOSIS — E271 Primary adrenocortical insufficiency: Secondary | ICD-10-CM | POA: Diagnosis not present

## 2023-11-21 DIAGNOSIS — I10 Essential (primary) hypertension: Secondary | ICD-10-CM | POA: Diagnosis not present

## 2023-11-21 DIAGNOSIS — I279 Pulmonary heart disease, unspecified: Secondary | ICD-10-CM | POA: Diagnosis not present

## 2023-11-21 DIAGNOSIS — K219 Gastro-esophageal reflux disease without esophagitis: Secondary | ICD-10-CM | POA: Diagnosis not present

## 2023-11-21 DIAGNOSIS — M1990 Unspecified osteoarthritis, unspecified site: Secondary | ICD-10-CM | POA: Diagnosis not present

## 2023-11-21 DIAGNOSIS — I444 Left anterior fascicular block: Secondary | ICD-10-CM | POA: Diagnosis not present

## 2023-11-21 DIAGNOSIS — M47814 Spondylosis without myelopathy or radiculopathy, thoracic region: Secondary | ICD-10-CM | POA: Diagnosis not present

## 2023-11-21 DIAGNOSIS — R9431 Abnormal electrocardiogram [ECG] [EKG]: Secondary | ICD-10-CM | POA: Diagnosis not present

## 2023-11-21 DIAGNOSIS — R0789 Other chest pain: Secondary | ICD-10-CM | POA: Diagnosis not present

## 2023-11-21 DIAGNOSIS — E2740 Unspecified adrenocortical insufficiency: Secondary | ICD-10-CM | POA: Diagnosis not present

## 2023-11-21 DIAGNOSIS — M069 Rheumatoid arthritis, unspecified: Secondary | ICD-10-CM | POA: Diagnosis not present

## 2023-11-21 DIAGNOSIS — Z79631 Long term (current) use of antimetabolite agent: Secondary | ICD-10-CM | POA: Diagnosis not present

## 2023-11-21 DIAGNOSIS — Z79899 Other long term (current) drug therapy: Secondary | ICD-10-CM | POA: Diagnosis not present

## 2023-11-21 DIAGNOSIS — M329 Systemic lupus erythematosus, unspecified: Secondary | ICD-10-CM | POA: Diagnosis not present

## 2023-11-21 DIAGNOSIS — E785 Hyperlipidemia, unspecified: Secondary | ICD-10-CM | POA: Diagnosis not present

## 2023-11-21 DIAGNOSIS — Z79891 Long term (current) use of opiate analgesic: Secondary | ICD-10-CM | POA: Diagnosis not present

## 2023-11-21 DIAGNOSIS — F514 Sleep terrors [night terrors]: Secondary | ICD-10-CM | POA: Diagnosis not present

## 2023-11-21 DIAGNOSIS — M47812 Spondylosis without myelopathy or radiculopathy, cervical region: Secondary | ICD-10-CM | POA: Diagnosis not present

## 2023-11-21 DIAGNOSIS — Z8673 Personal history of transient ischemic attack (TIA), and cerebral infarction without residual deficits: Secondary | ICD-10-CM | POA: Diagnosis not present

## 2023-11-21 DIAGNOSIS — Z880 Allergy status to penicillin: Secondary | ICD-10-CM | POA: Diagnosis not present

## 2023-11-21 DIAGNOSIS — M545 Low back pain, unspecified: Secondary | ICD-10-CM | POA: Diagnosis not present

## 2023-11-21 DIAGNOSIS — J45909 Unspecified asthma, uncomplicated: Secondary | ICD-10-CM | POA: Diagnosis not present

## 2023-11-21 DIAGNOSIS — Z91010 Allergy to peanuts: Secondary | ICD-10-CM | POA: Diagnosis not present

## 2023-11-21 DIAGNOSIS — R339 Retention of urine, unspecified: Secondary | ICD-10-CM | POA: Diagnosis not present

## 2023-11-21 DIAGNOSIS — R296 Repeated falls: Secondary | ICD-10-CM | POA: Diagnosis not present

## 2023-11-21 DIAGNOSIS — M25551 Pain in right hip: Secondary | ICD-10-CM | POA: Diagnosis not present

## 2023-11-21 DIAGNOSIS — R569 Unspecified convulsions: Secondary | ICD-10-CM | POA: Diagnosis not present

## 2023-11-21 DIAGNOSIS — K5010 Crohn's disease of large intestine without complications: Secondary | ICD-10-CM | POA: Diagnosis not present

## 2023-11-21 DIAGNOSIS — M47816 Spondylosis without myelopathy or radiculopathy, lumbar region: Secondary | ICD-10-CM | POA: Diagnosis not present

## 2023-11-21 DIAGNOSIS — E1140 Type 2 diabetes mellitus with diabetic neuropathy, unspecified: Secondary | ICD-10-CM | POA: Diagnosis not present

## 2023-11-21 DIAGNOSIS — E119 Type 2 diabetes mellitus without complications: Secondary | ICD-10-CM | POA: Diagnosis not present

## 2023-11-21 DIAGNOSIS — Z7982 Long term (current) use of aspirin: Secondary | ICD-10-CM | POA: Diagnosis not present

## 2023-11-21 DIAGNOSIS — G35 Multiple sclerosis: Secondary | ICD-10-CM | POA: Diagnosis not present

## 2023-11-21 DIAGNOSIS — R29898 Other symptoms and signs involving the musculoskeletal system: Secondary | ICD-10-CM | POA: Diagnosis not present

## 2023-11-21 DIAGNOSIS — R0902 Hypoxemia: Secondary | ICD-10-CM | POA: Diagnosis not present

## 2023-11-21 DIAGNOSIS — Z885 Allergy status to narcotic agent status: Secondary | ICD-10-CM | POA: Diagnosis not present

## 2023-11-21 DIAGNOSIS — E11319 Type 2 diabetes mellitus with unspecified diabetic retinopathy without macular edema: Secondary | ICD-10-CM | POA: Diagnosis not present

## 2023-11-21 DIAGNOSIS — G894 Chronic pain syndrome: Secondary | ICD-10-CM | POA: Diagnosis not present

## 2023-11-21 DIAGNOSIS — Z87440 Personal history of urinary (tract) infections: Secondary | ICD-10-CM | POA: Diagnosis not present

## 2023-11-21 DIAGNOSIS — G8190 Hemiplegia, unspecified affecting unspecified side: Secondary | ICD-10-CM | POA: Diagnosis not present

## 2023-11-21 DIAGNOSIS — M5124 Other intervertebral disc displacement, thoracic region: Secondary | ICD-10-CM | POA: Diagnosis not present

## 2023-11-25 DIAGNOSIS — Z743 Need for continuous supervision: Secondary | ICD-10-CM | POA: Diagnosis not present

## 2023-11-25 DIAGNOSIS — I6930 Unspecified sequelae of cerebral infarction: Secondary | ICD-10-CM | POA: Diagnosis not present

## 2023-11-25 DIAGNOSIS — G35 Multiple sclerosis: Secondary | ICD-10-CM | POA: Diagnosis not present

## 2023-11-25 DIAGNOSIS — K219 Gastro-esophageal reflux disease without esophagitis: Secondary | ICD-10-CM | POA: Diagnosis not present

## 2023-11-25 DIAGNOSIS — K5010 Crohn's disease of large intestine without complications: Secondary | ICD-10-CM | POA: Diagnosis not present

## 2023-11-25 DIAGNOSIS — E271 Primary adrenocortical insufficiency: Secondary | ICD-10-CM | POA: Diagnosis not present

## 2023-11-25 DIAGNOSIS — G8221 Paraplegia, complete: Secondary | ICD-10-CM | POA: Diagnosis not present

## 2023-11-25 DIAGNOSIS — M069 Rheumatoid arthritis, unspecified: Secondary | ICD-10-CM | POA: Diagnosis not present

## 2023-11-25 DIAGNOSIS — Z5321 Procedure and treatment not carried out due to patient leaving prior to being seen by health care provider: Secondary | ICD-10-CM | POA: Diagnosis not present

## 2023-11-25 DIAGNOSIS — Z7985 Long-term (current) use of injectable non-insulin antidiabetic drugs: Secondary | ICD-10-CM | POA: Diagnosis not present

## 2023-11-25 DIAGNOSIS — E2740 Unspecified adrenocortical insufficiency: Secondary | ICD-10-CM | POA: Diagnosis not present

## 2023-11-25 DIAGNOSIS — G894 Chronic pain syndrome: Secondary | ICD-10-CM | POA: Diagnosis not present

## 2023-11-25 DIAGNOSIS — G43709 Chronic migraine without aura, not intractable, without status migrainosus: Secondary | ICD-10-CM | POA: Diagnosis not present

## 2023-11-25 DIAGNOSIS — I679 Cerebrovascular disease, unspecified: Secondary | ICD-10-CM | POA: Diagnosis not present

## 2023-11-25 DIAGNOSIS — M545 Low back pain, unspecified: Secondary | ICD-10-CM | POA: Diagnosis not present

## 2023-11-25 DIAGNOSIS — Z8673 Personal history of transient ischemic attack (TIA), and cerebral infarction without residual deficits: Secondary | ICD-10-CM | POA: Diagnosis not present

## 2023-11-25 DIAGNOSIS — R519 Headache, unspecified: Secondary | ICD-10-CM | POA: Diagnosis not present

## 2023-11-25 DIAGNOSIS — R9431 Abnormal electrocardiogram [ECG] [EKG]: Secondary | ICD-10-CM | POA: Diagnosis not present

## 2023-11-25 DIAGNOSIS — E782 Mixed hyperlipidemia: Secondary | ICD-10-CM | POA: Diagnosis not present

## 2023-11-25 DIAGNOSIS — R0789 Other chest pain: Secondary | ICD-10-CM | POA: Diagnosis not present

## 2023-11-25 DIAGNOSIS — F514 Sleep terrors [night terrors]: Secondary | ICD-10-CM | POA: Diagnosis not present

## 2023-11-25 DIAGNOSIS — R296 Repeated falls: Secondary | ICD-10-CM | POA: Diagnosis not present

## 2023-11-25 DIAGNOSIS — E785 Hyperlipidemia, unspecified: Secondary | ICD-10-CM | POA: Diagnosis not present

## 2023-11-25 DIAGNOSIS — Z79899 Other long term (current) drug therapy: Secondary | ICD-10-CM | POA: Diagnosis not present

## 2023-11-25 DIAGNOSIS — M1990 Unspecified osteoarthritis, unspecified site: Secondary | ICD-10-CM | POA: Diagnosis not present

## 2023-11-25 DIAGNOSIS — E11319 Type 2 diabetes mellitus with unspecified diabetic retinopathy without macular edema: Secondary | ICD-10-CM | POA: Diagnosis not present

## 2023-11-25 DIAGNOSIS — E039 Hypothyroidism, unspecified: Secondary | ICD-10-CM | POA: Diagnosis not present

## 2023-11-25 DIAGNOSIS — R531 Weakness: Secondary | ICD-10-CM | POA: Diagnosis not present

## 2023-11-25 DIAGNOSIS — Z7982 Long term (current) use of aspirin: Secondary | ICD-10-CM | POA: Diagnosis not present

## 2023-11-25 DIAGNOSIS — G839 Paralytic syndrome, unspecified: Secondary | ICD-10-CM | POA: Diagnosis not present

## 2023-11-25 DIAGNOSIS — J984 Other disorders of lung: Secondary | ICD-10-CM | POA: Diagnosis not present

## 2023-11-25 DIAGNOSIS — M329 Systemic lupus erythematosus, unspecified: Secondary | ICD-10-CM | POA: Diagnosis not present

## 2023-11-25 DIAGNOSIS — E1140 Type 2 diabetes mellitus with diabetic neuropathy, unspecified: Secondary | ICD-10-CM | POA: Diagnosis not present

## 2023-11-25 DIAGNOSIS — M5416 Radiculopathy, lumbar region: Secondary | ICD-10-CM | POA: Diagnosis not present

## 2023-11-25 DIAGNOSIS — I10 Essential (primary) hypertension: Secondary | ICD-10-CM | POA: Diagnosis not present

## 2023-11-25 DIAGNOSIS — I951 Orthostatic hypotension: Secondary | ICD-10-CM | POA: Diagnosis not present

## 2023-11-25 DIAGNOSIS — Z794 Long term (current) use of insulin: Secondary | ICD-10-CM | POA: Diagnosis not present

## 2023-11-25 DIAGNOSIS — I6789 Other cerebrovascular disease: Secondary | ICD-10-CM | POA: Diagnosis not present

## 2023-11-25 DIAGNOSIS — E119 Type 2 diabetes mellitus without complications: Secondary | ICD-10-CM | POA: Diagnosis not present

## 2023-11-25 DIAGNOSIS — E43 Unspecified severe protein-calorie malnutrition: Secondary | ICD-10-CM | POA: Diagnosis not present

## 2023-11-26 DIAGNOSIS — Z5321 Procedure and treatment not carried out due to patient leaving prior to being seen by health care provider: Secondary | ICD-10-CM | POA: Diagnosis not present

## 2023-11-27 DIAGNOSIS — I6789 Other cerebrovascular disease: Secondary | ICD-10-CM | POA: Diagnosis not present

## 2023-11-27 DIAGNOSIS — R9431 Abnormal electrocardiogram [ECG] [EKG]: Secondary | ICD-10-CM | POA: Diagnosis not present

## 2023-11-27 DIAGNOSIS — I679 Cerebrovascular disease, unspecified: Secondary | ICD-10-CM | POA: Diagnosis not present

## 2023-11-28 DIAGNOSIS — G8221 Paraplegia, complete: Secondary | ICD-10-CM | POA: Diagnosis not present

## 2023-11-29 ENCOUNTER — Encounter: Payer: Self-pay | Admitting: *Deleted

## 2023-11-29 ENCOUNTER — Ambulatory Visit: Admitting: Endocrinology

## 2023-11-29 DIAGNOSIS — J984 Other disorders of lung: Secondary | ICD-10-CM | POA: Diagnosis not present

## 2023-11-29 NOTE — Progress Notes (Signed)
 Edelin Fryer                                          MRN: [id number]   11/29/2023   The [facility] Quality Team Specialist reviewed this patient medical record for the purposes of chart review for care gap closure. The following were reviewed: abstraction for care gap closure-glycemic status assessment.    [facility]BCI Quality Team

## 2023-11-29 NOTE — Progress Notes (Signed)
 Case Management Update  Date: 11/29/2023   Time: 6:03 PM   Patient Type: Inpatient       CM received message from medical team that patient reports she has received a bed offer from a SNF she was at a year ago. CM informed medical team that she will speak with the patient.   CM met with patient at the bedside  to discuss SNF. Patient reports she stayed at [facility] [place] [phone]. Patient contacted admissions to see if they have a bed available. Patient reports the admissions coordinator offered her a bed today. CM informed patient we will have to identify this information with the facility.   CM reached out to the admissions coordinator Santelle  at [facility] [phone] , unable to reach, CM left voice mail message           Case Management Coordination Status: Coordination In-Progress    Anticipated Discharge Location: Home with Home Health  If Plan A discharging location is not feasible: Potential Plan B: Skilled Nursing Facility - short-term rehab         Rosaline Karna Shuck, [place]

## 2023-11-30 DIAGNOSIS — G35 Multiple sclerosis: Secondary | ICD-10-CM | POA: Diagnosis not present

## 2023-11-30 DIAGNOSIS — R531 Weakness: Secondary | ICD-10-CM | POA: Diagnosis not present

## 2023-11-30 DIAGNOSIS — E43 Unspecified severe protein-calorie malnutrition: Secondary | ICD-10-CM | POA: Diagnosis not present

## 2023-11-30 DIAGNOSIS — R519 Headache, unspecified: Secondary | ICD-10-CM | POA: Diagnosis not present

## 2023-11-30 DIAGNOSIS — E782 Mixed hyperlipidemia: Secondary | ICD-10-CM | POA: Diagnosis not present

## 2023-11-30 DIAGNOSIS — I6930 Unspecified sequelae of cerebral infarction: Secondary | ICD-10-CM | POA: Diagnosis not present

## 2023-11-30 DIAGNOSIS — K219 Gastro-esophageal reflux disease without esophagitis: Secondary | ICD-10-CM | POA: Diagnosis not present

## 2023-11-30 DIAGNOSIS — M1990 Unspecified osteoarthritis, unspecified site: Secondary | ICD-10-CM | POA: Diagnosis not present

## 2023-11-30 DIAGNOSIS — M5416 Radiculopathy, lumbar region: Secondary | ICD-10-CM | POA: Diagnosis not present

## 2023-11-30 DIAGNOSIS — G894 Chronic pain syndrome: Secondary | ICD-10-CM | POA: Diagnosis not present

## 2023-11-30 DIAGNOSIS — E119 Type 2 diabetes mellitus without complications: Secondary | ICD-10-CM | POA: Diagnosis not present

## 2023-11-30 DIAGNOSIS — E2740 Unspecified adrenocortical insufficiency: Secondary | ICD-10-CM | POA: Diagnosis not present

## 2023-11-30 DIAGNOSIS — M545 Low back pain, unspecified: Secondary | ICD-10-CM | POA: Diagnosis not present

## 2023-11-30 DIAGNOSIS — I951 Orthostatic hypotension: Secondary | ICD-10-CM | POA: Diagnosis not present

## 2023-11-30 DIAGNOSIS — G839 Paralytic syndrome, unspecified: Secondary | ICD-10-CM | POA: Diagnosis not present

## 2023-11-30 DIAGNOSIS — Z743 Need for continuous supervision: Secondary | ICD-10-CM | POA: Diagnosis not present

## 2023-11-30 DIAGNOSIS — G43709 Chronic migraine without aura, not intractable, without status migrainosus: Secondary | ICD-10-CM | POA: Diagnosis not present

## 2023-11-30 NOTE — Care Plan (Signed)
  Problem: Safety - Adult Goal: Free from fall injury Description: INTERVENTIONS: 1. Assess pt frequently for physical needs 2. Identify cognitive and physical deficits and behaviors that affect risk of falls. 3. Institute fall precautions as indicated by assessment. 4. Educate pt/family on patient safety including physical limitations 5. Instruct pt to call for assistance with activity based on assessment 6. Modify environment to reduce risk of injury 7. Consider OT/PT consult to assist with strengthening/mobility Outcome: Progressing   Problem: INFECTION - ADULT Goal: Absence of infection during hospitalization Description: INTERVENTIONS: 1. Assess and monitor for signs and symptoms of infection 2. Monitor lab/diagnostic results 3. Monitor all insertion sites i.e., indwelling lines, tubes and drains 4. Monitor endotracheal (as able) and nasal secretions for changes in amount and color 5. Institute appropriate cooling/warming therapies per order 6. Administer medications as ordered 7. Instruct and encourage patient and family to use good hand hygiene technique 8. Identify and instruct in appropriate isolation precautions for identified infection/condition Outcome: Progressing   Problem: Compromised Skin Integrity Goal: Skin integrity is maintained or improved Description: Assess and monitor skin integrity. Identify patients at risk for skin breakdown on admission and per policy. Collaborate with interdisciplinary team and initiate plans and interventions as needed.    Outcome: Progressing

## 2023-11-30 NOTE — Care Plan (Signed)
 Problem: Knowledge Deficit Goal: Patient/family/caregiver demonstrates understanding of disease process, treatment plan, medications, and discharge instructions Description: Complete learning assessment and assess knowledge base. Outcome: Adequate for Discharge   Problem: Compromised Skin Integrity Goal: Skin integrity is maintained or improved Description: Assess and monitor skin integrity. Identify patients at risk for skin breakdown on admission and per policy. Collaborate with interdisciplinary team and initiate plans and interventions as needed.    Outcome: Adequate for Discharge Goal: Fluid and electrolyte balance are achieved/maintained Description: Assess and monitor vital signs (orthostatic vitals if applicable), fluid intake and output, urine color, labs, skin turgor, mucous membranes, jugular venous distention, edema, circumference of edematous extremities and abdominal girth, respiratory status, and mental status.  Monitor for signs and symptoms of hypovolemia (tachycardia, rapid breathing, decreased urine output, postural hypotension, confusion, syncope).  Monitor for signs and symptoms of hypervolemia (strong rapid pulse, shortness of breath, difficulty breathing lying down, crackles heard in lung fields, edema). Collaborate with interdisciplinary team and initiate plan and interventions as ordered. Outcome: Adequate for Discharge Goal: Nutritional status is improving Description: Monitor and assess patient for malnutrition (ex- brittle hair, bruises, dry skin, pale skin and conjunctiva, muscle wasting, smooth red tongue, and disorientation). Collaborate with interdisciplinary team and initiate plan and interventions as ordered.  Monitor patient's weight and dietary intake as ordered or per policy. Utilize nutrition screening tool and intervene per policy. Determine patient's food preferences and provide high-protein, high-caloric foods as appropriate.  Outcome: Adequate for  Discharge   Problem: Urinary Incontinence Goal: Perineal skin integrity is maintained or improved Description: Assess genitourinary system, perineal skin, labs (urinalysis), and history of incontinence to include past management, aggravating, and alleviating factors.  Collaborate with interdisciplinary team and initiate plans and interventions as needed. Outcome: Adequate for Discharge   Problem: Health Behavior Goal: Ability to state ways to decrease risk of aspiration will improve Description: Ability to state ways to decrease risk of aspiration will improve Outcome: Adequate for Discharge Goal: Will demonstrate good oral care before and after meals/snacks/liquids Description: Will demonstrate good oral care before and after meals/snacks/liquids Outcome: Adequate for Discharge Goal: Understanding of treatment plan will improve by discharge Outcome: Adequate for Discharge Goal: Compliance with treatment plan for underlying cause of condition will improve by discharge Outcome: Adequate for Discharge Goal: Identification of resources available to assist in meeting health care needs will improve by discharge Outcome: Adequate for Discharge Goal: Ability to care for self will improve by discharge Outcome: Adequate for Discharge Goal: Ability to state ways to decrease the risk of falls will improve by discharge Description: Ability to state ways to decrease the risk of falls will improve Outcome: Adequate for Discharge Goal: Ability to state behaviors that decrease the risk for skin breakdown will improve Description: Ability to state behaviors that decrease the risk for skin breakdown will improve Outcome: Adequate for Discharge Goal: Knowledge of disease or condition will improve Description: Knowledge of disease or condition will improve Outcome: Adequate for Discharge   Problem: Nutritional: Goal: Ability to achieve adequate nutritional intake will improve Description: Ability to  achieve adequate nutritional intake will improve Outcome: Adequate for Discharge Goal: Ability to tolerate tube feedings without aspirating will improve Description: Ability to tolerate tube feedings without aspirating will improve Outcome: Adequate for Discharge   Problem: Respiratory: Goal: Ability to remain free of respiratory infections will improve Description: Ability to remain free of respiratory infections will improve Outcome: Adequate for Discharge Goal: Ability to maintain a clear airway will improve Description: Ability  to maintain a clear airway will improve Outcome: Adequate for Discharge   Problem: Safety: Goal: Patient will follow aspiration precautions independently Description: Patient will follow aspiration precautions independently Outcome: Adequate for Discharge Goal: Demonstration of proper swallowing techniques will improve Description: Demonstration of proper swallowing techniques will improve Outcome: Adequate for Discharge   Problem: PAIN - ADULT Goal: Verbalizes/displays adequate comfort level or baseline comfort level Description: INTERVENTIONS: 1. Encourage pt to monitor pain and request assistance 2. Assess pain using appropriate pain scale 3. Administer analgesics based on type and severity of pain and evaluate response 4. Implement non-pharmacological measures as appropriate and evaluate response 5. Consider cultural and social influences on pain and pain management 6. Notify LIP if interventions unsuccessful or patient reports new pain Outcome: Adequate for Discharge   Problem: INFECTION - ADULT Goal: Absence of infection during hospitalization Description: INTERVENTIONS: 1. Assess and monitor for signs and symptoms of infection 2. Monitor lab/diagnostic results 3. Monitor all insertion sites i.e., indwelling lines, tubes and drains 4. Monitor endotracheal (as able) and nasal secretions for changes in amount and color 5. Institute appropriate  cooling/warming therapies per order 6. Administer medications as ordered 7. Instruct and encourage patient and family to use good hand hygiene technique 8. Identify and instruct in appropriate isolation precautions for identified infection/condition Outcome: Adequate for Discharge Goal: Absence of fever/infection during anticipated neutropenic period Description: INTERVENTIONS 1. Monitor WBC 2. Administer growth factors as ordered 3. Implement neutropenic guidelines as ordered Outcome: Adequate for Discharge   Problem: Safety - Adult Goal: Free from fall injury Description: INTERVENTIONS: 1. Assess pt frequently for physical needs 2. Identify cognitive and physical deficits and behaviors that affect risk of falls. 3. Institute fall precautions as indicated by assessment. 4. Educate pt/family on patient safety including physical limitations 5. Instruct pt to call for assistance with activity based on assessment 6. Modify environment to reduce risk of injury 7. Consider OT/PT consult to assist with strengthening/mobility Outcome: Adequate for Discharge Goal: Absence of infection during hospitalization Description: INTERVENTIONS: 1. Assess and monitor for signs and symptoms of infection 2. Monitor lab/diagnostic results 3. Monitor all insertion sites i.e., indwelling lines, tubes and drains 4. Monitor endotracheal (as able) and nasal secretions for changes in amount and color 5. Institute appropriate cooling/warming therapies per order 6. Administer medications as ordered 7. Instruct and encourage patient and family to use good hand hygiene technique 8. Identify and instruct in appropriate isolation precautions for identified infection/condition Outcome: Adequate for Discharge   Problem: DISCHARGE PLANNING Goal: Discharge to home or other facility with appropriate resources Description: INTERVENTIONS: 1. Identify barriers to discharge w/pt and caregiver 2. Arrange for needed  discharge resources and transportation as appropriate 3. Identify discharge learning needs (meds, wound care, etc) 4. Arrange for interpreters to assist at discharge as needed 5. Refer to Case Management Department for coordinating discharge planning if the patient needs post-hospital services based on physician order or complex needs related to functional status, cognitive ability or social support system Outcome: Adequate for Discharge   Problem: Chronic Conditions and Co-Morbidities Goal: Patient's chronic conditions and co-morbidity symptoms are monitored and maintained or improved Description: INTERVENTIONS: 1. Monitor and assess patient's chronic conditions and comorbid symptoms for stability, deterioration, or improvement 2. Collaborate with multidisciplinary team to address chronic and comorbid conditions and prevent exacerbation or deterioration 3. Update acute care plan with appropriate goals if chronic or comorbid symptoms are exacerbated and prevent overall improvement and discharge Outcome: Adequate for Discharge   Problem: Health  Behavior: Goal: MCB Ability to state ways to decrease the risk of falls will be met by discharge Description: Ability to state ways to decrease the risk of falls will improve by discharge Outcome: Adequate for Discharge   Problem: Safety: Goal: Will remain free from falls by discharge Description: Will remain free from falls by discharge Outcome: Adequate for Discharge   Problem: Health Behavior: Goal: Knowledge of disease or condition will improve by discharge Outcome: Adequate for Discharge Goal: Understanding of proper healthcare maintenance will be met by discharge Description: Understanding of proper healthcare maintenance will be met by discharge Outcome: Adequate for Discharge Goal: Verbalization of signs and symptoms of infection will improve by discharge Outcome: Adequate for Discharge   Problem: Coping: Goal: Ability to verbalize  feelings will improve by discharge Outcome: Adequate for Discharge Goal: Ability to verbalize ways to enhance comfort will improve by discharge Outcome: Adequate for Discharge   Problem: Fluid Volume: Goal: Ability to maintain a balanced intake and output will improve by discharge Description: Ability to maintain a balanced intake and output will improve by discharge Outcome: Adequate for Discharge   Problem: Urinary Elimination: Goal: Ability to recognize the need to void and respond appropriately will improve by discharge Outcome: Adequate for Discharge Goal: Ability to completely empty bladder with each voiding will improve by discharge Outcome: Adequate for Discharge Goal: Will remain free from infection by discharge Outcome: Adequate for Discharge

## 2023-11-30 NOTE — Progress Notes (Signed)
 Physician's Medical Necessity Certification Form (Required for Medicare / Medicaid Beneficiaries for non-emergency, scheduled or non-scheduled, ambulance transportation)  Ambulance Company and Contact #[name]  [phone])  Patient's Name: Andrea Wade Social Security Number: [id number] Address: [address] [place] [address] Phone Number: [phone] (home)  Transfer Information  Date of Service: 11/30/23 Origin of Transport:  [facility] Transport Destination:  [facility]   [address] Phone: [phone] City: [place] State: [place] Please Check: Skilled Nursing Facility  Medical Guidelines  Medicare Regulations, Part 410.40 (d)(1) defines Medical Necessity and Bed Confined In order for ambulance service to be covered, they must be medically necessary and reasonable.  Medical necessity is established when the patients' condition is such that transportation by other means is contraindicated.  The Health Care Financing Administration has defined Bed Confinement as: Patient is unable to get up from bed without assistance Patient is unable to ambulate Patient is unable to sit in a chair or wheelchair  If the said patient does not meet Bed Confined criteria as defined in the Medicare regulation, can this patient be transported safely unmonitored in a wheelchair van?  No  If No, please indicate the appropriate medical condition(s):  Severe Weakness, Immobilization, Cardiac Complication (Paralysis BLE)  ---------------------------------------------------------------------------------------------------------------------- I certify that the above information is true and correct based on my evaluation of this patient to the best of my knowledge and professional training, as supported in the medical record of this patient.   I understand that this information will be used by the  Department of Health and Health and safety inspector, Healthcare Financing Administration to support the determination of medical necessity for ambulance services.  I certify that the above information is true and correct based on my evaluation of this patient to the best of my knowledge and professional training, as supported in the medical record of this patient.  It is understood that I, the Medical Professional; being a Designer, jewellery, Case Designer, television/film set, has received a verbal order from the assigned physician, being Dr. Lynwood Curtistine Comes  on the Date of 11-30-2023, to authorize the medical necessity for the transport of the said patient above.  I understand that this information will be used by the Department of Health and Health and safety inspector, Healthcare Financing Administration to support the determination of medical necessity for ambulance services.    Signature of Medical Professional:  Rosaline Karna Shuck, RN Date:  11/30/2023

## 2023-11-30 NOTE — Discharge Summary (Signed)
 ------------------------------------------------------------------------------- Attestation signed by Lynwood Curtistine Comes, MD at 11/30/2023  5:05 PM I saw and evaluated the patient. I reviewed the resident's note and agree with plan as documented.   Lynwood Curtistine Comes, MD 11/30/2023  -------------------------------------------------------------------------------  [facility] Neurology Discharge Summary  Patient ID: Andrea Wade [id number] 53 y.o. 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 Cyrena; 02/03/24 Dr. Livingston  Discharge Medications: Current Outpatient Medications  Medication Instructions  . aspirin  81 mg, Daily  . baclofen  (LIORESAL ) 20 mg, Every 8 hours PRN  . dicyclomine  (BENTYL ) 10 mg, 3 times daily PRN  . divalproex  (DEPAKOTE ) 1,000 mg, oral, Nightly  . DULoxetine  (CYMBALTA ) 60 mg, 2 times daily  . EPINEPHrine  (EPIPEN ) 0.3 mg/0.3 mL injection syringe 0.3 mL, As needed  . esomeprazole (NEXIUM) 40 mg, 2 times daily  . famotidine  (PEPCID ) 20 mg, oral, Nightly  .  fentaNYL  (DURAGESIC ) 25 mcg/hr patch 1 patch, Every 72 hours  . flash glucose sensor (FreeStyle Libre 2 Sensor) kit 1 each, intradermal, Every 14 days  . folic acid  (FOLVITE ) 1,000 mcg, oral, Daily  . FreeStyle Libre 3 Plus Sensor Use as directed to monitor blood sugar levels. Follow package directions for replacement.  . FreeStyle Libre 3 Reader Dispense 1 freestyle libre 3 reader  . glucagon (Baqsimi) 3 mg/actuation spry 1 spray, nasal, As needed  . glucose monitoring kit kit Use as instructed to test blood sugar 4 times daily.  E11.65  . hydrocortisone  (CORTEF ) 5 mg tablet Take 1 tablet po qAM  . insulin  lispro (HumaLOG KwikPen) 100 unit/mL KwikPen Inject 10 units at lunch and 14 units at supper with additional as needed. MDD: 50 units/day  . lancets 33 gauge misc by miscellaneous route.  SABRA levothyroxine (SYNTHROID) 50 mcg, Daily  . methotrexate 7.5 mg, oral, Weekly, Take exactly as directed by prescriber.  . nortriptyline  (PAMELOR ) 75 mg, oral, At bedtime  . ondansetron  (ZOFRAN -ODT) 8 mg, Every 8 hours PRN  . oxyCODONE  (ROXICODONE ) 5 mg, Every 6 hours PRN  . Ozempic 1 mg, subcutaneous, Every 7 days  . pen needle, diabetic 32 gauge x 5/32 ndle Use as needed for 1 injection daily  . rimegepant (Nurtec ODT) 75 mg TbDL Take 1 pill every other day for prevention of headaches  . rosuvastatin  (CRESTOR ) 5 mg, oral, Daily  . Synjardy XR 12.5-1,000 mg TBph Take 1 tablet po daily  . topiramate  (TOPAMAX ) 200 mg, oral  . Tresiba  FlexTouch U-100 100 unit/mL (3 mL) pen Inject 20 units once a day with additional as needed. MDD: 50 units/day  . zinc oxide 12 % crea Apply topically.    Follow-up Appointments  Appointments which have been scheduled for you    Dec 22, 2023 10:00 AM Transfer with Donnice Miquel Cyrena, MD [facility]  - Neurology [facility] [phone]  [facility]) [address] [place] [address] [phone]     Feb 01, 2024 12:10  PM Office Visit with Lionel Reggy Molt, PA-C [facility]  - Endocrinology [facility] (--) [address] [place] [address] [phone]  Please arrive 15 minutes prior to your scheduled visit.     Feb 03, 2024 3:00 PM Office Visit with Amy Cy Pellant, PA-C [facility]  - Neurology [place] ([facility] [address] [facility] [place]) [address] [place] [address] [phone]  Please arrive 15 minutes prior to your scheduled visit.     May 03, 2024 1:00 PM Office Visit with Redell Lorene Ness, PA-C [facility]  - Rheumatology [facility] [place]) [address] [place] [address] [phone]  Please arrive 15 minutes prior to your scheduled visit.        Hospital Course   For full details, please see H&P, progress notes, consult notes and ancillary notes.   Briefly, Andrea Wade is a 53 y.o. 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 At OSH extensive imaging workup (MRI brain, MRI C-spine, MRI T-spine, MRI L-spine) did not show any structural etiologies of her weakness, though did demonstrate remote left parietal infarct and various degenerative changes of the spine. MRI L-spine showed foraminal stenoses. Routine EEG at OSH demonstrated no epileptiform activity.    The etiology of the seizure-like activity, weakness and falls is likely due to functional neurologic disorder, given description of events, physical exam, and prior similar  presentation. Lumbar foraminal stenosis could contribute to patient's weakness, though would not explain her full presentation.  Her similar presentation of bilateral lower extremity weakness in 2023 was extensively worked up, with normal NCS/EMG/US . Her symptoms resolved after short stay in rehabilitation. Suspicion of FND was discussed at length with the patient, who appeared to have good understanding and all questions were answered. PT and OT evaluated patient with recommendations to discharge to Rehabilitation Facility.  Equipment defered to next level of care. On 11/30/2023, patient was medically stable and appropriate for discharge to SNF for short stay rehab with close follow-up as above.   Other medical conditions discussed below in problem based format:   OTHER HOSPITAL PROBLEMS/CHRONIC MEDICAL CONDITIONS #History of Seizures: Patient continued on home Depakote  1000 mg nightly  #Constipation 2/2 chronic opioid use: Glycolax  1 packet daily, senna-docusate 2 tablets daily #HTN: Home lisinopril  5 mg held during admission  #DM2: Home Synjardy and Ozempic held during admission with sliding scale insulin  #Hypothyroidism: Continued on home Synthroid 50 mcg #SLE: Continued on weekly Methotrexate 7.5 mg (given on Tuesdays) #Chronic pain syndrome: Continued on home baclofen , duloxetine , nortriptyline , oxycodone  and fentanyl  patch #Addison's disease: Continued on home Cortef  5 mg daily  #Depression and anxiety: Continued on home duloxetine  120 mg #GERD: Continued on home Pepcid  20 mg PRN  Patient's Ordered Code Status: Full Code  Disposition: Skilled Nursing Facility - short-term rehab   Goals of Care:  Risks and benefits of all treatments and procedures discussed Code Status discussed   Objective   Vitals:   11/30/23 1112  BP: 118/71  Pulse: 86  Resp: 17  Temp: 97.9 F (36.6 C)  SpO2: 92%    Patient is overall comfortable appearing, lying in hospital bed. She is in no acute  distress. She has good perfusion throughout, with normal work of breathing. She is alert and oriented, with clear speech without aphasia or dysarthria. She does have edema in her lower extremities.    CRANIAL NERVES:    CN 2 (Optic): Visual fields intact to confrontation CN 3,4,6 (EOM): Pupils equal and reactive to light. Full extraocular eye movement without nystagmus. CN 5 (Trigeminal): Facial sensation is normal, no weakness of masticatory muscles. CN 7 (Facial): Facial asymmetry present on activation; normal when conversing CN 8 (Auditory): Auditory acuity grossly normal. CN 9,10 (Glossophar): The uvula is midline, the palate elevates symmetrically. CN 11 (spinal access): Normal sternocleidomastoid and trapezius strength. CN 12 (Hypoglossal): The tongue is midline. No atrophy or fasciculations.SABRA   MOTOR:   Muscle Strength:    UE  R  L   Deltoid  4+  5   Bicep  4+ 5   Tricep  4+ 5    No movement of lower extremities present  Muscle Tone: Tone and muscle bulk are normal in the upper and lower extremities.   REFLEXES:   DTRs - 1+ in lower extremities; 2+ in upper extremities   SENSATION:No sensation in bilateral lower extremities. Diminished sensation in RUE more than LUE. Pinprick: No sensation in any extremity. Only feels pressure sensation in LUE on pinprick assessment. Vibration: Absent in forehead. Intact in bilateral upper extremities.  Absent in bilateral lower extremities. Temperature: Diminished sensation to right side of face.  Intact in bilateral upper extremities.  No sensation bilateral lower extremities.   GAIT: Deferred   Electronically signed by:  Carlyon Earnie Orchard, MD 11/30/2023 1:03 PM

## 2023-12-01 ENCOUNTER — Encounter (HOSPITAL_COMMUNITY): Payer: Self-pay | Admitting: Emergency Medicine

## 2023-12-01 ENCOUNTER — Observation Stay (HOSPITAL_COMMUNITY)
Admission: EM | Admit: 2023-12-01 | Discharge: 2023-12-03 | Disposition: A | Discharge status: Home / Self Care | Attending: Family Medicine | Admitting: Family Medicine

## 2023-12-01 ENCOUNTER — Emergency Department (HOSPITAL_COMMUNITY)

## 2023-12-01 ENCOUNTER — Other Ambulatory Visit: Payer: Self-pay

## 2023-12-01 DIAGNOSIS — G929 Unspecified toxic encephalopathy: Secondary | ICD-10-CM | POA: Diagnosis not present

## 2023-12-01 DIAGNOSIS — Z794 Long term (current) use of insulin: Secondary | ICD-10-CM

## 2023-12-01 DIAGNOSIS — I951 Orthostatic hypotension: Secondary | ICD-10-CM | POA: Diagnosis not present

## 2023-12-01 DIAGNOSIS — G9341 Metabolic encephalopathy: Secondary | ICD-10-CM

## 2023-12-01 DIAGNOSIS — R29818 Other symptoms and signs involving the nervous system: Secondary | ICD-10-CM | POA: Diagnosis not present

## 2023-12-01 DIAGNOSIS — M329 Systemic lupus erythematosus, unspecified: Secondary | ICD-10-CM | POA: Diagnosis not present

## 2023-12-01 DIAGNOSIS — K219 Gastro-esophageal reflux disease without esophagitis: Secondary | ICD-10-CM | POA: Insufficient documentation

## 2023-12-01 DIAGNOSIS — G35D Multiple sclerosis, unspecified: Secondary | ICD-10-CM | POA: Insufficient documentation

## 2023-12-01 DIAGNOSIS — Z79899 Other long term (current) drug therapy: Secondary | ICD-10-CM | POA: Insufficient documentation

## 2023-12-01 DIAGNOSIS — F419 Anxiety disorder, unspecified: Secondary | ICD-10-CM | POA: Diagnosis not present

## 2023-12-01 DIAGNOSIS — E2740 Unspecified adrenocortical insufficiency: Secondary | ICD-10-CM | POA: Diagnosis not present

## 2023-12-01 DIAGNOSIS — M1611 Unilateral primary osteoarthritis, right hip: Secondary | ICD-10-CM | POA: Diagnosis not present

## 2023-12-01 DIAGNOSIS — Z7982 Long term (current) use of aspirin: Secondary | ICD-10-CM | POA: Insufficient documentation

## 2023-12-01 DIAGNOSIS — Z87891 Personal history of nicotine dependence: Secondary | ICD-10-CM | POA: Insufficient documentation

## 2023-12-01 DIAGNOSIS — E1165 Type 2 diabetes mellitus with hyperglycemia: Secondary | ICD-10-CM | POA: Diagnosis not present

## 2023-12-01 DIAGNOSIS — Z8673 Personal history of transient ischemic attack (TIA), and cerebral infarction without residual deficits: Secondary | ICD-10-CM | POA: Diagnosis not present

## 2023-12-01 DIAGNOSIS — E43 Unspecified severe protein-calorie malnutrition: Secondary | ICD-10-CM | POA: Diagnosis not present

## 2023-12-01 DIAGNOSIS — Z7901 Long term (current) use of anticoagulants: Secondary | ICD-10-CM | POA: Diagnosis not present

## 2023-12-01 DIAGNOSIS — F32A Depression, unspecified: Secondary | ICD-10-CM | POA: Diagnosis not present

## 2023-12-01 DIAGNOSIS — E119 Type 2 diabetes mellitus without complications: Secondary | ICD-10-CM | POA: Insufficient documentation

## 2023-12-01 DIAGNOSIS — G43709 Chronic migraine without aura, not intractable, without status migrainosus: Secondary | ICD-10-CM | POA: Diagnosis not present

## 2023-12-01 DIAGNOSIS — M25551 Pain in right hip: Secondary | ICD-10-CM | POA: Diagnosis not present

## 2023-12-01 DIAGNOSIS — G894 Chronic pain syndrome: Secondary | ICD-10-CM

## 2023-12-01 DIAGNOSIS — R41841 Cognitive communication deficit: Secondary | ICD-10-CM | POA: Insufficient documentation

## 2023-12-01 DIAGNOSIS — R569 Unspecified convulsions: Secondary | ICD-10-CM | POA: Diagnosis not present

## 2023-12-01 DIAGNOSIS — R4701 Aphasia: Secondary | ICD-10-CM | POA: Diagnosis not present

## 2023-12-01 DIAGNOSIS — E271 Primary adrenocortical insufficiency: Secondary | ICD-10-CM | POA: Insufficient documentation

## 2023-12-01 DIAGNOSIS — G9349 Other encephalopathy: Secondary | ICD-10-CM

## 2023-12-01 DIAGNOSIS — I10 Essential (primary) hypertension: Secondary | ICD-10-CM | POA: Insufficient documentation

## 2023-12-01 DIAGNOSIS — R531 Weakness: Principal | ICD-10-CM

## 2023-12-01 DIAGNOSIS — Z91040 Latex allergy status: Secondary | ICD-10-CM | POA: Diagnosis not present

## 2023-12-01 DIAGNOSIS — G8929 Other chronic pain: Secondary | ICD-10-CM | POA: Diagnosis not present

## 2023-12-01 DIAGNOSIS — R2990 Unspecified symptoms and signs involving the nervous system: Secondary | ICD-10-CM | POA: Diagnosis present

## 2023-12-01 DIAGNOSIS — E785 Hyperlipidemia, unspecified: Secondary | ICD-10-CM | POA: Diagnosis not present

## 2023-12-01 DIAGNOSIS — E039 Hypothyroidism, unspecified: Secondary | ICD-10-CM | POA: Insufficient documentation

## 2023-12-01 DIAGNOSIS — I6782 Cerebral ischemia: Secondary | ICD-10-CM | POA: Diagnosis not present

## 2023-12-01 DIAGNOSIS — M069 Rheumatoid arthritis, unspecified: Secondary | ICD-10-CM | POA: Diagnosis not present

## 2023-12-01 DIAGNOSIS — E038 Other specified hypothyroidism: Secondary | ICD-10-CM | POA: Diagnosis not present

## 2023-12-01 DIAGNOSIS — R29810 Facial weakness: Secondary | ICD-10-CM | POA: Diagnosis not present

## 2023-12-01 LAB — I-STAT CHEM 8, ED
BUN: 20 mg/dL (ref 6–20)
Calcium, Ion: 1.15 mmol/L (ref 1.15–1.40)
Chloride: 102 mmol/L (ref 98–111)
Creatinine, Ser: 0.9 mg/dL (ref 0.44–1.00)
Glucose, Bld: 221 mg/dL — ABNORMAL HIGH (ref 70–99)
HCT: 45 % (ref 36.0–46.0)
Hemoglobin: 15.3 g/dL — ABNORMAL HIGH (ref 12.0–15.0)
Potassium: 4.4 mmol/L (ref 3.5–5.1)
Sodium: 136 mmol/L (ref 135–145)
TCO2: 22 mmol/L (ref 22–32)

## 2023-12-01 LAB — DIFFERENTIAL
Abs Immature Granulocytes: 0.05 K/uL (ref 0.00–0.07)
Basophils Absolute: 0.1 K/uL (ref 0.0–0.1)
Basophils Relative: 1 %
Eosinophils Absolute: 0.1 K/uL (ref 0.0–0.5)
Eosinophils Relative: 1 %
Immature Granulocytes: 1 %
Lymphocytes Relative: 41 %
Lymphs Abs: 3 K/uL (ref 0.7–4.0)
Monocytes Absolute: 0.8 K/uL (ref 0.1–1.0)
Monocytes Relative: 11 %
Neutro Abs: 3.3 K/uL (ref 1.7–7.7)
Neutrophils Relative %: 45 %

## 2023-12-01 LAB — TSH: TSH: 2.679 u[IU]/mL (ref 0.350–4.500)

## 2023-12-01 LAB — CBC
HCT: 44.5 % (ref 36.0–46.0)
Hemoglobin: 14.1 g/dL (ref 12.0–15.0)
MCH: 27.8 pg (ref 26.0–34.0)
MCHC: 31.7 g/dL (ref 30.0–36.0)
MCV: 87.8 fL (ref 80.0–100.0)
Platelets: 217 K/uL (ref 150–400)
RBC: 5.07 MIL/uL (ref 3.87–5.11)
RDW: 17.4 % — ABNORMAL HIGH (ref 11.5–15.5)
WBC: 7.3 K/uL (ref 4.0–10.5)
nRBC: 0.3 % — ABNORMAL HIGH (ref 0.0–0.2)

## 2023-12-01 LAB — COMPREHENSIVE METABOLIC PANEL WITH GFR
ALT: 16 U/L (ref 0–44)
AST: 20 U/L (ref 15–41)
Albumin: 3.1 g/dL — ABNORMAL LOW (ref 3.5–5.0)
Alkaline Phosphatase: 60 U/L (ref 38–126)
Anion gap: 12 (ref 5–15)
BUN: 20 mg/dL (ref 6–20)
CO2: 22 mmol/L (ref 22–32)
Calcium: 9 mg/dL (ref 8.9–10.3)
Chloride: 99 mmol/L (ref 98–111)
Creatinine, Ser: 0.91 mg/dL (ref 0.44–1.00)
GFR, Estimated: >60 mL/min (ref >60)
Glucose, Bld: 214 mg/dL — ABNORMAL HIGH (ref 70–99)
Potassium: 4.4 mmol/L (ref 3.5–5.1)
Sodium: 133 mmol/L — ABNORMAL LOW (ref 135–145)
Total Bilirubin: 0.8 mg/dL (ref 0.0–1.2)
Total Protein: 7.6 g/dL (ref 6.5–8.1)

## 2023-12-01 LAB — RAPID URINE DRUG SCREEN, HOSP PERFORMED
Amphetamines: NOT DETECTED
Barbiturates: NOT DETECTED
Benzodiazepines: NOT DETECTED
Cocaine: NOT DETECTED
Opiates: POSITIVE — AB
Tetrahydrocannabinol: NOT DETECTED

## 2023-12-01 LAB — GLUCOSE, CAPILLARY: Glucose-Capillary: 182 mg/dL — ABNORMAL HIGH (ref 70–99)

## 2023-12-01 LAB — ETHANOL: Alcohol, Ethyl (B): <15 mg/dL (ref <15)

## 2023-12-01 LAB — PROTIME-INR
INR: 0.9 (ref 0.8–1.2)
Prothrombin Time: 12.9 s (ref 11.4–15.2)

## 2023-12-01 LAB — CBG MONITORING, ED: Glucose-Capillary: 206 mg/dL — ABNORMAL HIGH (ref 70–99)

## 2023-12-01 LAB — APTT: aPTT: 25 s (ref 24–36)

## 2023-12-01 MED ORDER — HYDROCORTISONE 10 MG PO TABS
10.0000 mg | ORAL_TABLET | Freq: Every day | ORAL | Status: DC
  Administered 2023-12-02 – 2023-12-03 (×2): 10 mg via ORAL
  Filled 2023-12-01 (×3): qty 1

## 2023-12-01 MED ORDER — ONDANSETRON HCL 4 MG/2ML IJ SOLN
4.0000 mg | Freq: Once | INTRAMUSCULAR | Status: AC
  Administered 2023-12-01: 4 mg via INTRAVENOUS
  Filled 2023-12-01: qty 2

## 2023-12-01 MED ORDER — NORTRIPTYLINE HCL 25 MG PO CAPS
75.0000 mg | ORAL_CAPSULE | Freq: Every day | ORAL | Status: DC
  Administered 2023-12-02: 75 mg via ORAL
  Filled 2023-12-01 (×2): qty 3

## 2023-12-01 MED ORDER — ACETAMINOPHEN 650 MG RE SUPP: 650.0000 mg | RECTAL | Status: DC | PRN

## 2023-12-01 MED ORDER — ROSUVASTATIN CALCIUM 5 MG PO TABS
5.0000 mg | ORAL_TABLET | Freq: Every day | ORAL | Status: DC
  Administered 2023-12-01 – 2023-12-02 (×2): 5 mg via ORAL
  Filled 2023-12-01 (×2): qty 1

## 2023-12-01 MED ORDER — OXYCODONE HCL 5 MG PO TABS
5.0000 mg | ORAL_TABLET | Freq: Four times a day (QID) | ORAL | Status: DC | PRN
  Administered 2023-12-01 – 2023-12-03 (×5): 5 mg via ORAL
  Filled 2023-12-01 (×5): qty 1

## 2023-12-01 MED ORDER — HYDROXYZINE HCL 25 MG PO TABS
25.0000 mg | ORAL_TABLET | Freq: Three times a day (TID) | ORAL | Status: DC | PRN
  Administered 2023-12-01 – 2023-12-02 (×3): 25 mg via ORAL
  Filled 2023-12-01 (×3): qty 1

## 2023-12-01 MED ORDER — DULOXETINE HCL 60 MG PO CPEP
60.0000 mg | ORAL_CAPSULE | Freq: Two times a day (BID) | ORAL | Status: DC
  Administered 2023-12-01 – 2023-12-03 (×4): 60 mg via ORAL
  Filled 2023-12-01 (×4): qty 1

## 2023-12-01 MED ORDER — MORPHINE SULFATE (PF) 4 MG/ML IV SOLN
4.0000 mg | Freq: Once | INTRAVENOUS | Status: AC
  Administered 2023-12-01: 4 mg via INTRAVENOUS
  Filled 2023-12-01: qty 1

## 2023-12-01 MED ORDER — ASPIRIN 81 MG PO TBEC
81.0000 mg | DELAYED_RELEASE_TABLET | Freq: Every day | ORAL | Status: DC
  Administered 2023-12-02 – 2023-12-03 (×2): 81 mg via ORAL
  Filled 2023-12-01 (×2): qty 1

## 2023-12-01 MED ORDER — RIMEGEPANT SULFATE 75 MG PO TBDP
75.0000 mg | ORAL_TABLET | ORAL | Status: DC
Start: 2023-12-03 — End: 2023-12-01

## 2023-12-01 MED ORDER — TOPIRAMATE 100 MG PO TABS
200.0000 mg | ORAL_TABLET | Freq: Two times a day (BID) | ORAL | Status: DC
Start: 2023-12-01 — End: 2023-12-02
  Administered 2023-12-02: 200 mg via ORAL
  Filled 2023-12-01 (×2): qty 2

## 2023-12-01 MED ORDER — ACETAMINOPHEN 325 MG PO TABS
650.0000 mg | ORAL_TABLET | ORAL | Status: DC | PRN
  Administered 2023-12-02: 650 mg via ORAL
  Filled 2023-12-01: qty 2

## 2023-12-01 MED ORDER — HYDROMORPHONE HCL 1 MG/ML IJ SOLN
0.5000 mg | INTRAMUSCULAR | Status: DC | PRN
  Administered 2023-12-01 – 2023-12-03 (×7): 0.5 mg via INTRAVENOUS
  Filled 2023-12-01 (×7): qty 0.5

## 2023-12-01 MED ORDER — FAMOTIDINE 20 MG PO TABS
20.0000 mg | ORAL_TABLET | Freq: Every day | ORAL | Status: DC
  Administered 2023-12-01 – 2023-12-02 (×2): 20 mg via ORAL
  Filled 2023-12-01 (×2): qty 1

## 2023-12-01 MED ORDER — TRIMETHOBENZAMIDE HCL 100 MG/ML IM SOLN
200.0000 mg | Freq: Four times a day (QID) | INTRAMUSCULAR | Status: DC | PRN
  Administered 2023-12-01 – 2023-12-03 (×4): 200 mg via INTRAMUSCULAR
  Filled 2023-12-01 (×5): qty 2

## 2023-12-01 MED ORDER — ACETAMINOPHEN 160 MG/5ML PO SOLN: 650.0000 mg | ORAL | Status: DC | PRN

## 2023-12-01 MED ORDER — HYDROCORTISONE 5 MG PO TABS
5.0000 mg | ORAL_TABLET | Freq: Every evening | ORAL | Status: DC
  Administered 2023-12-01 – 2023-12-02 (×2): 5 mg via ORAL
  Filled 2023-12-01 (×3): qty 1

## 2023-12-01 MED ORDER — FENTANYL 25 MCG/HR TD PT72: 1.0000 | MEDICATED_PATCH | TRANSDERMAL | Status: DC

## 2023-12-01 MED ORDER — DIVALPROEX SODIUM ER 500 MG PO TB24
1000.0000 mg | ORAL_TABLET | Freq: Every day | ORAL | Status: DC
  Administered 2023-12-01 – 2023-12-02 (×2): 1000 mg via ORAL
  Filled 2023-12-01 (×3): qty 2

## 2023-12-01 MED ORDER — ENOXAPARIN SODIUM 40 MG/0.4ML IJ SOSY
40.0000 mg | PREFILLED_SYRINGE | INTRAMUSCULAR | Status: DC
  Administered 2023-12-02 – 2023-12-03 (×2): 40 mg via SUBCUTANEOUS
  Filled 2023-12-01 (×2): qty 0.4

## 2023-12-01 MED ORDER — STROKE: EARLY STAGES OF RECOVERY BOOK
Freq: Once | Status: AC
  Filled 2023-12-01: qty 1

## 2023-12-01 MED ORDER — HYDRALAZINE HCL 20 MG/ML IJ SOLN: 10.0000 mg | INTRAMUSCULAR | Status: DC | PRN

## 2023-12-01 MED ORDER — METHOTREXATE SODIUM 2.5 MG PO TABS: 7.5000 mg | ORAL_TABLET | ORAL | Status: DC

## 2023-12-01 MED ORDER — DICYCLOMINE HCL 10 MG PO CAPS: 10.0000 mg | ORAL_CAPSULE | Freq: Every day | ORAL | Status: DC | PRN

## 2023-12-01 MED ORDER — INSULIN GLARGINE 100 UNIT/ML ~~LOC~~ SOLN
20.0000 [IU] | Freq: Every day | SUBCUTANEOUS | Status: DC
  Administered 2023-12-02 – 2023-12-03 (×2): 20 [IU] via SUBCUTANEOUS
  Filled 2023-12-01 (×2): qty 0.2

## 2023-12-01 MED ORDER — LEVOTHYROXINE SODIUM 50 MCG PO TABS
50.0000 ug | ORAL_TABLET | Freq: Every day | ORAL | Status: DC
  Administered 2023-12-02 – 2023-12-03 (×2): 50 ug via ORAL
  Filled 2023-12-01 (×2): qty 1

## 2023-12-01 MED ORDER — PANTOPRAZOLE SODIUM 40 MG PO TBEC
40.0000 mg | DELAYED_RELEASE_TABLET | Freq: Every day | ORAL | Status: DC
  Administered 2023-12-02 – 2023-12-03 (×2): 40 mg via ORAL
  Filled 2023-12-01 (×2): qty 1

## 2023-12-01 MED ORDER — INSULIN ASPART 100 UNIT/ML IJ SOLN
0.0000 [IU] | Freq: Three times a day (TID) | INTRAMUSCULAR | Status: DC
  Administered 2023-12-02: 8 [IU] via SUBCUTANEOUS
  Administered 2023-12-02: 3 [IU] via SUBCUTANEOUS
  Administered 2023-12-03: 8 [IU] via SUBCUTANEOUS
  Administered 2023-12-03: 5 [IU] via SUBCUTANEOUS

## 2023-12-01 MED ORDER — BACLOFEN 10 MG PO TABS
20.0000 mg | ORAL_TABLET | Freq: Three times a day (TID) | ORAL | Status: DC | PRN
  Administered 2023-12-02 – 2023-12-03 (×3): 20 mg via ORAL
  Filled 2023-12-01 (×3): qty 2

## 2023-12-01 MED ORDER — SODIUM CHLORIDE 0.9 % IV SOLN: INTRAVENOUS | Status: AC

## 2023-12-01 NOTE — ED Triage Notes (Signed)
 Per EMS pt coming from nursing facility that she has been at for about 2 days for right facial droop, decreased sensation to bilateral legs and delayed speech. Patient was seen at 9 am and at her baseline. Staff checked in on her at 11:30 and noticed symptoms. Patient bed bound at baseline due to MS.

## 2023-12-01 NOTE — Code Documentation (Signed)
 Stroke Response Nurse Documentation Code Documentation  Andrea Wade is a 53 y.o. female arriving to [facility]  via [facility] on 12/01/2023 with past medical hx of MS, seizures, DM, FND. On aspirin  81 mg daily. Code stroke was activated by EMS.   Patient from [facility] where she was LKW at 0900 and now complaining of rt sided weakness, facial droop and dysarthria. Stroke team at the bedside on patient arrival. Labs drawn and patient cleared for CT by Dr. Emil. Patient to CT with team. NIHSS 16, see documentation for details and code stroke times. Patient with decreased LOC, right facial droop, bilateral arm weakness, bilateral leg weakness, right decreased sensation, and dysarthria  on exam. The following imaging was completed:  CT Head. Patient is not a candidate for IV Thrombolytic due to stroke not suspected. Patient is not a candidate for IR due to stroke not suspected, MRS 4..   Care Plan: q 2 NIHSS and BP x 12 hrs then q 4.   Bedside handoff with ED RN Izetta.    Andrea Wade  Stroke Response RN

## 2023-12-01 NOTE — ED Notes (Signed)
 This RN has called the unit and the nurse stated she is ready for the patient to come upstairs.

## 2023-12-01 NOTE — Plan of Care (Signed)
  Problem: Coping: Goal: Will verbalize positive feelings about self Outcome: Progressing Goal: Will identify appropriate support needs Outcome: Progressing   

## 2023-12-01 NOTE — H&P (Signed)
 History and Physical    Patient: Andrea Wade DOB: 08/25/1970 DOA: 12/01/2023 DOS: the patient was seen and examined on 12/01/2023 PCP: Venancio Pock, PA-C  Patient coming from: Home  Chief Complaint:  Chief Complaint  Patient presents with   Code Stroke   HPI: Andrea Wade is a 53 y.o. female with medical history significant of multiple sclerosis, seizure disorder on Depakote  and Topamax , history of CVA, Crohn's disease, hypothyroidism, chronic migraines, chronic back pain, narcotic dependence, diabetes mellitus type 2, anxiety, depression, and rheumatoid arthritis who presented with new onset of right-sided facial droop, decreased sensation in bilateral legs, and delayed speech.  She experienced acute onset of slurred speech, a sensation of tongue swelling, and a feeling of her throat closing. These symptoms were accompanied by arm and leg weakness, sweating, nausea, and a general feeling of being unwell. The symptoms began after 10 AM today.  She was in her normal state of health when she woke up this morning.  Denies having any similar symptoms after a seizure before.  She has a history of seizures, with the last possible seizure occurring on 9/21 when she was found on the floor unconscious by her husband at their home and was subsequently taken to [facility].  Review of records note workup including MRI of the brain, cervical spine T-spine, and L-spine that did not show any acute cause for her weakness.  Routine EEG showed no elliptic form activity at baseline patient appears patient underwent extensive is bedbound.  She was transferred to [facility] on 9/25-9/30 prior to being discharged to to the rehab facility yesterday.  In the emergency department patient was seen as a code stroke.  CT scan of the head did not reveal any acute abnormality.  Patient was not a thrombolytics candidate due to clear cause of symptoms.  X-rays of the hip  noted no acute abnormality. Vital signs were noted to be stable.  Labs noted sodium 133 and glucose 214.  Neurology recommended completion of stroke workup.  Patient had been given morphine  point milligrams IV and Zofran  4 mg IV.   Review of Systems: As mentioned in the history of present illness. All other systems reviewed and are negative. Past Medical History:  Diagnosis Date   Anemia    Anxiety    Arthritis    joints (04/17/2013)   Chronic back pain    all over (04/17/2013)   Crohn disease (HCC)    Daily headache    Depression    Diabetes type 2, uncontrolled    Epileptic (HCC)    had 1-2/night; started RX; last one was 01/2014   GERD (gastroesophageal reflux disease)    Migraine    q day (04/17/2013)   MS (multiple sclerosis)    Pneumonia    about 10 times in my lifetime (04/17/2013)   Past Surgical History:  Procedure Laterality Date   CARDIAC CATHETERIZATION N/A 10/08/2015   Procedure: Left Heart Cath and Coronary Angiography;  Surgeon: Peter M Swaziland, MD;  Location: [facility] INVASIVE CV LAB;  Service: Cardiovascular;  Laterality: N/A;   CHOLECYSTECTOMY  1994   TUBAL LIGATION  1993   Social History:  reports that she has quit smoking. Her smoking use included cigarettes. She has a 10.5 pack-year smoking history. She has never used smokeless tobacco. She reports that she does not drink alcohol and does not use drugs.  Allergies  Allergen Reactions   Codeine Hives, Itching and Palpitations   Peanut-Containing Drug Products  Anaphylaxis and Itching   Aspirin  Itching    High doses only-baby is ok   Corn-Containing Products Rash   Latex Dermatitis   Pecan Extract Itching   Penicillins Hives, Itching and Nausea And Vomiting    Allergy from childhood Has patient had a PCN reaction causing immediate rash, facial/tongue/throat swelling, SOB or lightheadedness with hypotension: Yes Has patient had a PCN reaction causing severe rash involving mucus membranes or skin necrosis:  Unk Has patient had a PCN reaction that required hospitalization: Unk Has patient had a PCN reaction occurring within the last 10 years: No If all of the above answers are NO, then may proceed with Cephalosporin use.     Shellfish-Derived Products Rash and Swelling   Tape Other (See Comments)    Slight irritation Other reaction(s): Other (See Comments) Slight irritation    Family History  Problem Relation Age of Onset   Diabetes Mother    Heart disease Mother    Stroke Mother    Heart failure Mother    Heart disease Father    Stroke Father    Heart failure Father    Heart attack Brother    Healthy Brother     Prior to Admission medications   Medication Sig Start Date End Date Taking? Authorizing Provider  aspirin  EC 81 MG tablet Take 1 tablet (81 mg total) by mouth daily. Swallow whole. 08/11/23  Yes Fairy Frames, MD  baclofen  (LIORESAL ) 20 MG tablet Take 20 mg by mouth every 8 (eight) hours as needed for muscle spasms. 03/15/20  Yes [provider]  dicyclomine  (BENTYL ) 10 MG capsule Take 10 mg by mouth daily as needed for spasms (IBS symptoms). 09/01/21  Yes [provider]  divalproex  (DEPAKOTE  ER) 500 MG 24 hr tablet Take 1,000 mg by mouth at bedtime. 10/06/21  Yes [provider]  DULoxetine  (CYMBALTA ) 60 MG capsule Take 60 mg by mouth 2 (two) times daily.   Yes [provider]  Empagliflozin-metFORMIN  HCl ER 12.06-998 MG TB24 Take 1 tablet by mouth daily. 07/30/20  Yes [provider]  esomeprazole (NEXIUM) 40 MG capsule Take 40 mg by mouth daily. 10/14/21  Yes [provider]  famotidine  (PEPCID ) 20 MG tablet Take 20 mg by mouth at bedtime.   Yes [provider]  fentaNYL  (DURAGESIC ) 25 MCG/HR 1 patch every 3 (three) days. 01/13/22  Yes [provider]  folic acid  (FOLVITE ) 1 MG tablet Take 1 mg by mouth daily. 09/26/21  Yes [provider]  hydrocortisone  (CORTEF ) 5 MG tablet Take 2 tablets (10  mg total) by mouth daily AND 1 tablet (5 mg total) every evening. Follow up with endocrinology outpatient for refills. Patient taking differently: Give 1 tablet by mouth in the morning for rheumatoid arthritis  08/13/23  Yes [name], MD  hydrOXYzine (ATARAX) 25 MG tablet Take 25 mg by mouth 3 (three) times daily as needed for anxiety.   Yes [provider]  insulin  lispro (HUMALOG) 100 UNIT/ML injection Inject 10-14 Units into the skin in the morning and at bedtime. Inject 10 unit subcataneously one time a day in the morning. Inject 14 unit subcutaneously in the evening.   Yes [provider]  levothyroxine (SYNTHROID) 50 MCG tablet Take 50 mcg by mouth daily before breakfast.   Yes [provider]  nortriptyline  (PAMELOR ) 75 MG capsule Take 75 mg by mouth at bedtime. 07/30/20  Yes [provider]  ondansetron  (ZOFRAN ) 8 MG tablet Take 8 mg by  mouth every 8 (eight) hours as needed for nausea or vomiting.   Yes [provider]  ondansetron  (ZOFRAN -ODT) 8 MG disintegrating tablet Take 8 mg by mouth 3 (three) times daily as needed for nausea. 06/10/23  Yes [provider]  oxyCODONE  (OXY IR/ROXICODONE ) 5 MG immediate release tablet Take 5 mg by mouth every 6 (six) hours as needed for moderate pain (pain score 4-6).   Yes [provider]  OZEMPIC, 1 MG/DOSE, 4 MG/3ML SOPN Inject 1 mg into the skin once a week. 09/29/23  Yes [provider]  Rimegepant Sulfate 75 MG TBDP Take 75 mg by mouth every other day.   Yes [provider]  rosuvastatin  (CRESTOR ) 5 MG tablet Take 5 mg by mouth at bedtime. 07/30/21  Yes [provider]  topiramate  (TOPAMAX ) 200 MG tablet Take 200 mg by mouth 2 (two) times daily. 04/29/20  Yes [provider]  TRESIBA  FLEXTOUCH 100 UNIT/ML FlexTouch Pen Inject 35 Units into the skin daily. Patient taking differently: Inject 20 Units into the skin daily. 08/13/23  Yes [name], MD  insulin  aspart (NOVOLOG ) 100 UNIT/ML injection Inject 0-9 Units into the skin 3 (three) times daily with meals. CBG < 70: treat for low blood sugar CBG 70 - 120: 0 units CBG 121 - 150: 1 unit CBG 151 - 200: 2 units CBG 201 - 250: 3 units CBG 251 - 300: 5 units CBG 301 - 350: 7 units CBG 351 - 400: 9 units CBG > 400: call MD Patient not taking: Reported on 12/01/2023 08/13/23   [name], MD  methotrexate (RHEUMATREX) 7.5 MG tablet Take 7.5 mg by mouth once a week. Every Monday Caution Chemotherapy. Protect from light. Patient not taking: Reported on 12/01/2023 12/06/23   [provider]    Physical Exam: Vitals:   12/01/23 1415 12/01/23 1500 12/01/23 1530 12/01/23 1615  BP: 120/84 125/86 125/81 117/88  Pulse: 88 92 87 89  Resp: 17 18 17 16   Temp:      TempSrc:      SpO2: 97% 95% 96% 94%  Weight:      Height:        Constitutional: Frail chronically ill-appearing female Eyes: PERRL, lids and conjunctivae normal ENMT: Mucous membranes are moist.  Right sided facial droop present Neck: normal, supple  Respiratory: clear to auscultation bilaterally, no wheezing, no crackles. Normal respiratory effort. No accessory muscle use.  Cardiovascular: Regular rate and rhythm, no murmurs / rubs / gallops. No extremity edema. 2+ pedal pulses  Abdomen: no tenderness, no masses palpated.  Bowel sounds positive.  Musculoskeletal: no clubbing / cyanosis.  Ulnar deviation noted of the bilateral upper extremities with changes consistent with arthritis Skin: Areas of bruising noted of the upper extremities Neurologic: CN 2-12 grossly intact.  Decreased sensation noted to bilateral lower extremities.  Right upper extremity 3/5 strength with left upper extremity 4/5.  Patient only able to wiggle toes slightly and bilateral lower extremities Psychiatric: Normal judgment and insight. Alert and oriented x 3. Normal mood.   Data Reviewed:  EKG reveals sinus rhythm  at 93 bpm with right axis deviation and QTc prolonged at 514.  Reviewed labs, imaging, and pertinent records as documented.  Assessment and Plan:  Stroke-like symptoms Patient presents with acute onset of right-sided facial droop, changes in sensation in her lower extremities, and change in speech.  Initial CT scan of the head did not reveal any acute abnormality.  Patient was  not thought to be a candidate for thrombolytics due to unclear cause of symptom..  Patient had extensive workup including MRIs and EEG just recently at [facility] and subsequently evaluated at [facility].  She was just discharged yesterday with thought symptoms caused by a functional neurologic disorder.  - Admit to a telemetry bed - Neurochecks - Check MRI of the brain - PT/OT/speech to eval and treat - Appreciate neurology consultative services, will follow-up for any further recommendations.  Multiple sclerosis Chronic pain - Continue duloxetine , nortriptyline , fentanyl  patch, baclofen  as needed, oxycodone  as needed  Uncontrolled diabetes mellitus type 2, with long-term use of insulin  Last available hemoglobin A1c noted to be 7.9 when checked on 9/26. - Hypoglycemic protocols - Continue Tresiba  20 units daily - CBGs before every meal with moderate SSI - Adjust insulin  regimen as deemed medically appropriate  Hypertension Blood pressures elevated up to 146/85. - Held lisinopril  5 mg due to concern for possible stroke - Consider resuming lisinopril  in a.m. if MRI negative  Seizure disorder Patient thinks last possible seizure was possibly on 9/21 when she was found on the floor unconscious by her husband  - Continue Topamax  and Depakote   Rheumatoid arthritis Patient on methotrexate every Tuesday - Continue in the outpatient setting  Addison's disease - Continue hydrocortisone   Hypothyroidism - add-on TSH - Continue levothyroxine  Anxiety and depression - Continue  duloxetine   GERD - Continue PPI  DVT prophylaxis: Lovenox  Advance Care Planning:   Code Status: Full Code    Consults: Neurology  Family Communication: None  Severity of Illness: The appropriate patient status for this patient is OBSERVATION. Observation status is judged to be reasonable and necessary in order to provide the required intensity of service to ensure the patient's safety. The patient's presenting symptoms, physical exam findings, and initial radiographic and laboratory data in the context of their medical condition is felt to place them at decreased risk for further clinical deterioration. Furthermore, it is anticipated that the patient will be medically stable for discharge from the hospital within 2 midnights of admission.   Author: Maximino DELENA Sharps, MD 12/01/2023 5:13 PM  For on call review [email or web address].

## 2023-12-01 NOTE — ED Provider Notes (Signed)
 [place] EMERGENCY DEPARTMENT AT [facility] Provider Note   CSN: [id number] Arrival date & time: 12/01/23  1239     Patient presents with: Code Stroke   Andrea Wade is a 53 y.o. female.   53 yo F with a cc of R sided facial droop and generalized weakness. Noticed about 9am today.  Hx of stroke lives in nursing facility.  Arrived as code stroke level V caveat acuity of condition.         Prior to Admission medications   Medication Sig Start Date End Date Taking? Authorizing Provider  aspirin  EC 81 MG tablet Take 1 tablet (81 mg total) by mouth daily. Swallow whole. 08/11/23  Yes Fairy Frames, MD  baclofen  (LIORESAL ) 20 MG tablet Take 20 mg by mouth every 8 (eight) hours as needed for muscle spasms. 03/15/20  Yes [provider]  dicyclomine  (BENTYL ) 10 MG capsule Take 10 mg by mouth daily as needed for spasms (IBS symptoms). 09/01/21  Yes [provider]  divalproex  (DEPAKOTE  ER) 500 MG 24 hr tablet Take 1,000 mg by mouth at bedtime. 10/06/21  Yes [provider]  DULoxetine  (CYMBALTA ) 60 MG capsule Take 60 mg by mouth 2 (two) times daily.   Yes [provider]  Empagliflozin-metFORMIN  HCl ER 12.06-998 MG TB24 Take 1 tablet by mouth daily. 07/30/20  Yes [provider]  esomeprazole (NEXIUM) 40 MG capsule Take 40 mg by mouth daily. 10/14/21  Yes [provider]  famotidine  (PEPCID ) 20 MG tablet Take 20 mg by mouth at bedtime.   Yes [provider]  fentaNYL  (DURAGESIC ) 25 MCG/HR 1 patch every 3 (three) days. 01/13/22  Yes [provider]  folic acid  (FOLVITE ) 1 MG tablet Take 1 mg by mouth daily. 09/26/21  Yes [provider]  hydrocortisone  (CORTEF ) 5 MG tablet Take 2 tablets (10 mg total) by mouth daily AND 1 tablet (5 mg total) every evening. Follow up with endocrinology outpatient for refills. Patient taking differently: Give 1 tablet by mouth in the morning for rheumatoid arthritis   08/13/23  Yes [name], MD  hydrOXYzine (ATARAX) 25 MG tablet Take 25 mg by mouth 3 (three) times daily as needed for anxiety.   Yes [provider]  insulin  lispro (HUMALOG) 100 UNIT/ML injection Inject 10-14 Units into the skin in the morning and at bedtime. Inject 10 unit subcataneously one time a day in the morning. Inject 14 unit subcutaneously in the evening.   Yes [provider]  levothyroxine (SYNTHROID) 50 MCG tablet Take 50 mcg by mouth daily before breakfast.   Yes [provider]  nortriptyline  (PAMELOR ) 75 MG capsule Take 75 mg by mouth at bedtime. 07/30/20  Yes [provider]  ondansetron  (ZOFRAN ) 8 MG tablet Take 8 mg by mouth every 8 (eight) hours as needed for nausea or vomiting.   Yes [provider]  ondansetron  (ZOFRAN -ODT) 8 MG disintegrating tablet Take 8 mg by mouth 3 (three) times daily as needed for nausea. 06/10/23  Yes [provider]  oxyCODONE  (OXY IR/ROXICODONE ) 5 MG immediate release tablet Take 5 mg by mouth every 6 (six) hours as needed for moderate pain (pain score 4-6).   Yes [provider]  OZEMPIC, 1 MG/DOSE, 4 MG/3ML SOPN Inject 1 mg into the skin once a week. 09/29/23  Yes [provider]  Rimegepant Sulfate 75 MG TBDP Take 75 mg by mouth every other day.   Yes [provider]  rosuvastatin  (  CRESTOR ) 5 MG tablet Take 5 mg by mouth at bedtime. 07/30/21  Yes [provider]  topiramate  (TOPAMAX ) 200 MG tablet Take 200 mg by mouth 2 (two) times daily. 04/29/20  Yes [provider]  TRESIBA  FLEXTOUCH 100 UNIT/ML FlexTouch Pen Inject 35 Units into the skin daily. Patient taking differently: Inject 20 Units into the skin daily. 08/13/23  Yes [name], MD  insulin  aspart (NOVOLOG ) 100 UNIT/ML injection Inject 0-9 Units into the skin 3 (three) times daily with meals. CBG < 70: treat for low blood sugar CBG 70 - 120: 0 units CBG 121 - 150: 1  unit CBG 151 - 200: 2 units CBG 201 - 250: 3 units CBG 251 - 300: 5 units CBG 301 - 350: 7 units CBG 351 - 400: 9 units CBG > 400: call MD Patient not taking: Reported on 12/01/2023 08/13/23   [name], MD  methotrexate (RHEUMATREX) 7.5 MG tablet Take 7.5 mg by mouth once a week. Every Monday Caution Chemotherapy. Protect from light. Patient not taking: Reported on 12/01/2023 12/06/23   [provider]    Allergies: Codeine, Peanut-containing drug products, Aspirin , Corn-containing products, Latex, Pecan extract, Penicillins, Shellfish-derived products, and Tape    Review of Systems  Updated Vital Signs BP 125/81   Pulse 87   Temp 97.8 F (36.6 C) (Oral)   Resp 17   Ht 5' 9 (1.753 m)   Wt 92 kg   SpO2 96%   BMI 29.95 kg/m   Physical Exam Vitals and nursing note reviewed.  Constitutional:      General: She is not in acute distress.    Appearance: She is well-developed. She is not diaphoretic.  HENT:     Head: Normocephalic and atraumatic.  Eyes:     Pupils: Pupils are equal, round, and reactive to light.  Cardiovascular:     Rate and Rhythm: Normal rate and regular rhythm.     Heart sounds: No murmur heard.    No friction rub. No gallop.  Pulmonary:     Effort: Pulmonary effort is normal.     Breath sounds: No wheezing or rales.  Abdominal:     General: There is no distension.     Palpations: Abdomen is soft.     Tenderness: There is no abdominal tenderness.  Musculoskeletal:        General: No tenderness.     Cervical back: Normal range of motion and neck supple.  Skin:    General: Skin is warm and dry.  Neurological:     Mental Status: She is alert and oriented to person, place, and time.  Psychiatric:        Behavior: Behavior normal.     (all labs ordered are listed, but only abnormal results are displayed) Labs Reviewed  CBC - Abnormal; Notable for the following components:      Result Value   RDW 17.4 (*)    nRBC 0.3 (*)     All other components within normal limits  COMPREHENSIVE METABOLIC PANEL WITH GFR - Abnormal; Notable for the following components:   Sodium 133 (*)    Glucose, Bld 214 (*)    Albumin 3.1 (*)    All other components within normal limits  RAPID URINE DRUG SCREEN, HOSP PERFORMED - Abnormal; Notable for the following components:   Opiates POSITIVE (*)    All other components within normal limits  I-STAT CHEM 8, ED - Abnormal; Notable for the following components:  Glucose, Bld 221 (*)    Hemoglobin 15.3 (*)    All other components within normal limits  CBG MONITORING, ED - Abnormal; Notable for the following components:   Glucose-Capillary 206 (*)    All other components within normal limits  ETHANOL  PROTIME-INR  APTT  DIFFERENTIAL    EKG: EKG Interpretation Date/Time:  Wednesday December 01 2023 13:01:57 EDT Ventricular Rate:  93 PR Interval:  185 QRS Duration:  102 QT Interval:  413 QTC Calculation: 514 R Axis:   170  Text Interpretation: Sinus rhythm Right axis deviation Abnormal R-wave progression, late transition Borderline T abnormalities, anterior leads Prolonged QT interval No significant change since last tracing Confirmed by Emil Share [phone]) on 12/01/2023 2:11:12 PM  Radiology: CT HEAD CODE STROKE WO CONTRAST Result Date: 12/01/2023 CLINICAL DATA:  Code stroke. Neuro deficit, acute, stroke suspected. Additional history provided: Aphasia, decreased sensation in both legs, right-sided facial droop. EXAM: CT HEAD WITHOUT CONTRAST TECHNIQUE: Contiguous axial images were obtained from the base of the skull through the vertex without intravenous contrast. RADIATION DOSE REDUCTION: This exam was performed according to the departmental dose-optimization program which includes automated exposure control, adjustment of the mA and/or kV according to patient size and/or use of iterative reconstruction technique. COMPARISON:  Brain MRI 11/22/2023. FINDINGS: Brain: No age-advanced or  lobar predominant cerebral atrophy. Redemonstrated chronic cortical/subcortical infarcts within the temporal and occipital lobes on the left, as well as right occipital lobe. Background mild patchy and ill-defined hypoattenuation within the cerebral white matter, nonspecific but compatible with chronic small vessel ischemic disease. There is no acute intracranial hemorrhage. No acute demarcated cortical infarct. No extra-axial fluid collection. No evidence of an intracranial mass. No midline shift. Vascular: No hyperdense vessel. Atherosclerotic calcifications. Skull: No calvarial fracture or aggressive osseous lesion. Sinuses/Orbits: No mass or acute finding within the imaged orbits. No significant paranasal sinus disease. ASPECTS [facility] Stroke Program Early CT Score) - Ganglionic level infarction (caudate, lentiform nuclei, internal capsule, insula, M1-M3 cortex): 7 - Supraganglionic infarction (M4-M6 cortex): 3 Total score (0-10 with 10 being normal): 10 No evidence of an acute intracranial abnormality. These results were communicated to Dr. Merrianne at 1:16 pmon 10/1/2025by text page via the [facility] messaging system. IMPRESSION: 1. No evidence of an acute intracranial abnormality. 2. Chronic small vessel ischemic disease and chronic infarcts, as described. Electronically Signed   By: Rockey Childs D.O.   On: 12/01/2023 13:17     Procedures   Medications Ordered in the ED  morphine  (PF) 4 MG/ML injection 4 mg (4 mg Intravenous Given 12/01/23 1332)  ondansetron  (ZOFRAN ) injection 4 mg (4 mg Intravenous Given 12/01/23 1331)                                    Medical Decision Making Amount and/or Complexity of Data Reviewed Labs: ordered. Radiology: ordered.  Risk Prescription drug management.   53 yo F with a cc of R sided facial droop and weakness. Arrived as code stroke. Patient with hx of stroke lives in a skilled nursing facility.  Airway cleared at the bridge.  Taken urgently to CT.  CT  without obvious acute intracranial finding.  Patient's symptoms have resolved.  Not thought to be a TNK candidate.  She was having some right hip pain and so had recommended right hip films.  My independent interpretation without fracture.   Patient care signed out to Dr. Midge, please  see their note for further details of care in the ED.  The patients results and plan were reviewed and discussed.   Any x-rays performed were independently reviewed by myself.   Differential diagnosis were considered with the presenting HPI.  Medications  morphine  (PF) 4 MG/ML injection 4 mg (4 mg Intravenous Given 12/01/23 1332)  ondansetron  (ZOFRAN ) injection 4 mg (4 mg Intravenous Given 12/01/23 1331)    Vitals:   12/01/23 1400 12/01/23 1415 12/01/23 1500 12/01/23 1530  BP: 120/87 120/84 125/86 125/81  Pulse: 87 88 92 87  Resp: 14 17 18 17   Temp:      TempSrc:      SpO2: 95% 97% 95% 96%  Weight:      Height:        Final diagnoses:  Acute weakness    Admission/ observation were discussed with the admitting physician, patient and/or family and they are comfortable with the plan.       Final diagnoses:  Acute weakness    ED Discharge Orders     None          Emil Share, DO 12/01/23 1544

## 2023-12-01 NOTE — Consult Note (Signed)
 NEUROLOGY CONSULT NOTE   Date of service: December 01, 2023 Patient Name: Laranda Burkemper MRN:  [id number] DOB:  12/25/1970 Chief Complaint: Diffuse weakness and facial droop Requesting Provider: Emil Share, DO  History of Present Illness  Kharma Sampsel is a 53 y.o. female with a PMHx of MS with severe disability, anemia, anxiety, arthritis, chronic back pain, Crohn's disease, daily migraine headaches, depression, DM2, and epilepsy who presents from her SNF with new onset of right facial droop, decreased sensation to BLE and delayed speech. She was last at her baseline at 9:00 AM. On re-checking her at 11:30 AM, staff noticed the above symptoms. Of note, she is non-ambulatory and bedbound at baseline due to her MS. She is complaining of right hip pain. Home medications include daily ASA.   LKW: 0900 Modified rankin score: 4 IV Thrombolysis: No: Presentation is of uncertain etiology, with uncertain localization on exam. Stroke is significantly lower on the DDx than MS exacerbation or toxic/metabolic/infectious encephalopathy. Risks of TNK significantly outweigh potential benefits.  EVT: No: Presentation is not consistent with LVO  NIHSS components Score: Comment  1a Level of Conscious 0[]  1[x]  2[]  3[]      1b LOC Questions 0[]  1[x]  2[]       1c LOC Commands 0[x]  1[]  2[]       2 Best Gaze 0[x]  1[]  2[]       3 Visual 0[x]  1[]  2[]  3[]      4 Facial Palsy 0[]  1[x]  2[]  3[]     Left, mild/subtle  5a Motor Arm - left 0[]  1[]  2[x]  3[]  4[]  UN[]    5b Motor Arm - Right 0[]  1[]  2[]  3[x]  4[]  UN[]    6a Motor Leg - Left 0[]  1[]  2[]  3[x]  4[]  UN[]   Increased tone and decreased muscle bulk to BLE. Unable to elicit patellar or achilles reflexes. Toes are mute.   6b Motor Leg - Right 0[]  1[]  2[]  3[x]  4[]  UN[]    7 Limb Ataxia 0[x]  1[]  2[]  UN[]      8 Sensory 0[]  1[]  2[x]  UN[]     Right upper extremity is less than LUE. Has severe sensory loss below distal thigh level, with inability to feel pressure, scratch  stimuli or noxious plantar stimulation.   9 Best Language 0[x]  1[]  2[]  3[]      10 Dysarthria 0[]  1[x]  2[]  UN[]      11 Extinct. and Inattention 0[x]  1[]  2[]       TOTAL:  17      ROS  Also complains of an occipital headache. Other ROS as per HPI.   Past History   Past Medical History:  Diagnosis Date   Anemia    Anxiety    Arthritis    joints (04/17/2013)   Chronic back pain    all over (04/17/2013)   Crohn disease (HCC)    Daily headache    Depression    Diabetes type 2, uncontrolled    Epileptic (HCC)    had 1-2/night; started RX; last one was 01/2014   GERD (gastroesophageal reflux disease)    Migraine    q day (04/17/2013)   MS (multiple sclerosis)    Pneumonia    about 10 times in my lifetime (04/17/2013)     Past Surgical History:  Procedure Laterality Date   CARDIAC CATHETERIZATION N/A 10/08/2015   Procedure: Left Heart Cath and Coronary Angiography;  Surgeon: Peter M [place], MD;  Location: [facility] INVASIVE CV LAB;  Service: Cardiovascular;  Laterality: N/A;   CHOLECYSTECTOMY  1994   TUBAL LIGATION  1993  Family History: Family History  Problem Relation Age of Onset   Diabetes Mother    Heart disease Mother    Stroke Mother    Heart failure Mother    Heart disease Father    Stroke Father    Heart failure Father    Heart attack Brother    Healthy Brother     Social History  reports that she has quit smoking. Her smoking use included cigarettes. She has a 10.5 pack-year smoking history. She has never used smokeless tobacco. She reports that she does not drink alcohol and does not use drugs.  Allergies  Allergen Reactions   Codeine Hives, Itching and Palpitations   Peanut-Containing Drug Products Anaphylaxis and Itching   Aspirin  Itching    High doses only-baby is ok   Corn-Containing Products Rash   Latex Dermatitis   Pecan Extract Itching   Penicillins Hives, Itching and Nausea And Vomiting    Allergy from childhood Has patient had a PCN  reaction causing immediate rash, facial/tongue/throat swelling, SOB or lightheadedness with hypotension: Yes Has patient had a PCN reaction causing severe rash involving mucus membranes or skin necrosis: Unk Has patient had a PCN reaction that required hospitalization: Unk Has patient had a PCN reaction occurring within the last 10 years: No If all of the above answers are NO, then may proceed with Cephalosporin use.     Shellfish-Derived Products Rash and Swelling   Tape Other (See Comments)    Slight irritation Other reaction(s): Other (See Comments) Slight irritation    Medications  No current facility-administered medications for this encounter.  Current Outpatient Medications:    aspirin  EC 81 MG tablet, Take 1 tablet (81 mg total) by mouth daily. Swallow whole., Disp: , Rfl:    baclofen  (LIORESAL ) 20 MG tablet, Take 20 mg by mouth every 8 (eight) hours as needed for muscle spasms., Disp: , Rfl:    dicyclomine  (BENTYL ) 10 MG capsule, Take 10 mg by mouth daily as needed for spasms (IBS symptoms)., Disp: , Rfl:    divalproex  (DEPAKOTE  ER) 500 MG 24 hr tablet, Take 1,000 mg by mouth at bedtime., Disp: , Rfl:    DULoxetine  (CYMBALTA ) 60 MG capsule, Take 60 mg by mouth 2 (two) times daily., Disp: , Rfl:    Empagliflozin-metFORMIN  HCl ER 12.06-998 MG TB24, Take 1 tablet by mouth daily., Disp: , Rfl:    esomeprazole (NEXIUM) 40 MG capsule, Take 40 mg by mouth daily., Disp: , Rfl:    famotidine  (PEPCID ) 20 MG tablet, Take 20 mg by mouth at bedtime., Disp: , Rfl:    fentaNYL  (DURAGESIC ) 25 MCG/HR, 1 patch every 3 (three) days., Disp: , Rfl:    folic acid  (FOLVITE ) 1 MG tablet, Take 1 mg by mouth daily., Disp: , Rfl:    hydrocortisone  (CORTEF ) 5 MG tablet, Take 2 tablets (10 mg total) by mouth daily AND 1 tablet (5 mg total) every evening. Follow up with endocrinology outpatient for refills. (Patient taking differently: Give 1 tablet by mouth in the morning for rheumatoid arthritis ), Disp:  , Rfl:    hydrOXYzine (ATARAX) 25 MG tablet, Take 25 mg by mouth 3 (three) times daily as needed for anxiety., Disp: , Rfl:    insulin  lispro (HUMALOG) 100 UNIT/ML injection, Inject 10-14 Units into the skin in the morning and at bedtime. Inject 10 unit subcataneously one time a day in the morning. Inject 14 unit subcutaneously in the evening., Disp: , Rfl:    levothyroxine (SYNTHROID) 50 MCG  tablet, Take 50 mcg by mouth daily before breakfast., Disp: , Rfl:    nortriptyline  (PAMELOR ) 75 MG capsule, Take 75 mg by mouth at bedtime., Disp: , Rfl:    ondansetron  (ZOFRAN ) 8 MG tablet, Take 8 mg by mouth every 8 (eight) hours as needed for nausea or vomiting., Disp: , Rfl:    ondansetron  (ZOFRAN -ODT) 8 MG disintegrating tablet, Take 8 mg by mouth 3 (three) times daily as needed for nausea., Disp: , Rfl:    oxyCODONE  (OXY IR/ROXICODONE ) 5 MG immediate release tablet, Take 5 mg by mouth every 6 (six) hours as needed for moderate pain (pain score 4-6)., Disp: , Rfl:    OZEMPIC, 1 MG/DOSE, 4 MG/3ML SOPN, Inject 1 mg into the skin once a week., Disp: , Rfl:    Rimegepant Sulfate 75 MG TBDP, Take 75 mg by mouth every other day., Disp: , Rfl:    rosuvastatin  (CRESTOR ) 5 MG tablet, Take 5 mg by mouth at bedtime., Disp: , Rfl:    topiramate  (TOPAMAX ) 200 MG tablet, Take 200 mg by mouth 2 (two) times daily., Disp: , Rfl:    TRESIBA  FLEXTOUCH 100 UNIT/ML FlexTouch Pen, Inject 35 Units into the skin daily. (Patient taking differently: Inject 20 Units into the skin daily.), Disp: , Rfl:    insulin  aspart (NOVOLOG ) 100 UNIT/ML injection, Inject 0-9 Units into the skin 3 (three) times daily with meals. CBG < 70: treat for low blood sugar CBG 70 - 120: 0 units CBG 121 - 150: 1 unit CBG 151 - 200: 2 units CBG 201 - 250: 3 units CBG 251 - 300: 5 units CBG 301 - 350: 7 units CBG 351 - 400: 9 units CBG > 400: call MD (Patient not taking: Reported on December 04, 2023), Disp: , Rfl:    [START ON 12/06/2023] methotrexate (RHEUMATREX) 7.5  MG tablet, Take 7.5 mg by mouth once a week. Every Monday Caution Chemotherapy. Protect from light. (Patient not taking: Reported on December 04, 2023), Disp: , Rfl:   Vitals   Vitals:   12-04-2023 1345 12/04/2023 1400 December 04, 2023 1415 04-Dec-2023 1500  BP: 131/86 120/87 120/84 125/86  Pulse: 89 87 88 92  Resp: 18 14 17 18   Temp:      TempSrc:      SpO2: 98% 95% 97% 95%  Weight:      Height:        Body mass index is 29.95 kg/m.   Physical Exam   Constitutional: Frail appearing female lying on stretcher in NAD. Psych: Blunted affect Eyes: No scleral injection.  HENT: No OP obstruction.  Head: Normocephalic. No neck stiffness Respiratory: Effort normal, non-labored breathing.    Neurologic Examination   See NIHSS  Labs/Imaging/Neurodiagnostic studies   CBC:  Recent Labs  Lab December 04, 2023 1250 2023-12-04 1255  WBC  --  7.3  NEUTROABS  --  3.3  HGB 15.3* 14.1  HCT 45.0 44.5  MCV  --  87.8  PLT  --  217   Basic Metabolic Panel:  Lab Results  Component Value Date   NA 133 (L) 12/04/23   K 4.4 2023/12/04   CO2 22 December 04, 2023   GLUCOSE 214 (H) 2023-12-04   BUN 20 2023-12-04   CREATININE 0.91 12-04-2023   CALCIUM  9.0 12-04-2023   GFRNONAA >60 December 04, 2023   GFRAA >60 04/19/2016   Lipid Panel:  Lab Results  Component Value Date   LDLCALC 60 08/09/2023   HgbA1c:  Lab Results  Component Value Date   HGBA1C 9.6 (H) 08/09/2023  Urine Drug Screen:     Component Value Date/Time   LABOPIA POSITIVE (A) 04/17/2013 0946   COCAINSCRNUR NONE DETECTED 04/17/2013 0946   LABBENZ NONE DETECTED 04/17/2013 0946   AMPHETMU NONE DETECTED 04/17/2013 0946   THCU NONE DETECTED 04/17/2013 0946   LABBARB NONE DETECTED 04/17/2013 0946    Alcohol Level     Component Value Date/Time   ETH <15 12/01/2023 1255   INR  Lab Results  Component Value Date   INR 0.9 12/01/2023   APTT  Lab Results  Component Value Date   APTT 25 12/01/2023    ASSESSMENT  53 y.o. female with a PMHx of MS  with severe disability, anemia, anxiety, arthritis, chronic back pain, Crohn's disease, daily migraine headaches, depression, DM2, and epilepsy who presents from her SNF with new onset of right facial droop, decreased sensation to BLE and delayed speech. She was last at her baseline at 9:00 AM. On re-checking her at 11:30 AM, staff noticed the above symptoms. Of note, she is non-ambulatory and bedbound at baseline due to her MS. She is complaining of right hip pain. Home medications include daily ASA and Depakote .  - Patient with decreased LOC, right facial droop, bilateral arm weakness, bilateral leg weakness, right decreased sensation, and dysarthria  on exam. The majority of her deficits are most likely secondary to her long-standing MS given her severe chronic disability and being bedbound at baseline. NIHSS 17.  - CT head: No evidence of an acute intracranial abnormality. Chronic small vessel ischemic disease and chronic infarcts.  - Impression: Acute onset of right sided weakness, left facial droop, dysarthria and depressed level of consciousness. DDx is broad, including MS exacerbation, unwitnessed seizure with postictal state and toxic/metabolic/infectious encephalopathy. Stroke is felt to be unlikely.   RECOMMENDATIONS  - Work up for possible metabolic and infectious etiologies.  - MRI brain w/wo contrast - No evidence for seizure activity on exam or per EMS/Rehab center. Will hold off on EEG for now.  - PT/OT/Speech - Continue home ASA - Continue her home Depakote  - IVF ______________________________________________________________________    Bonney SHARK, Jolissa Kapral, MD [facility]

## 2023-12-01 NOTE — ED Provider Notes (Signed)
 I assumed care in signout.  Imaging is negative.  Per neurology, patient to be admitted She is awake and alert.  She reports mostly having pain in her right hip.  There is no deformities.  X-ray negative.  Abdomen is soft.  She is in no acute distress  Discussed with Dr. Maximino Sharps for admission   Midge Golas, MD 12/01/23 1718

## 2023-12-01 NOTE — ED Notes (Signed)
 Patient transported to X-ray

## 2023-12-01 NOTE — ED Notes (Addendum)
 Patient transported to X-ray

## 2023-12-02 ENCOUNTER — Observation Stay (HOSPITAL_COMMUNITY)

## 2023-12-02 DIAGNOSIS — G9389 Other specified disorders of brain: Secondary | ICD-10-CM | POA: Diagnosis not present

## 2023-12-02 DIAGNOSIS — R2990 Unspecified symptoms and signs involving the nervous system: Secondary | ICD-10-CM | POA: Diagnosis not present

## 2023-12-02 DIAGNOSIS — R29818 Other symptoms and signs involving the nervous system: Secondary | ICD-10-CM | POA: Diagnosis not present

## 2023-12-02 LAB — GLUCOSE, CAPILLARY
Glucose-Capillary: 140 mg/dL — ABNORMAL HIGH (ref 70–99)
Glucose-Capillary: 164 mg/dL — ABNORMAL HIGH (ref 70–99)
Glucose-Capillary: 189 mg/dL — ABNORMAL HIGH (ref 70–99)
Glucose-Capillary: 226 mg/dL — ABNORMAL HIGH (ref 70–99)
Glucose-Capillary: 260 mg/dL — ABNORMAL HIGH (ref 70–99)

## 2023-12-02 MED ORDER — DEXTROSE 50 % IV SOLN
INTRAVENOUS | Status: AC
  Filled 2023-12-02: qty 50

## 2023-12-02 MED ORDER — ENSURE PLUS HIGH PROTEIN PO LIQD
237.0000 mL | Freq: Two times a day (BID) | ORAL | Status: DC
  Administered 2023-12-03: 237 mL via ORAL

## 2023-12-02 NOTE — Evaluation (Signed)
 Occupational Therapy Evaluation Patient Details Name: Andrea Wade MRN: [id number] DOB: 1971-02-13 Today's Date: 12/02/2023   History of Present Illness   Pt is a 53 y/o female presenting on 10/1 from SNF (there for rehab) with R sided facial droop and generalized weakness. 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 Xray R hip negative, CT negative, MRI pending.  PMH includes: MS, chronic pain, RA, seizure disorder, anemia, DM2, migraine, pna.     Clinical Impressions Pt admitted for above and presents with problem list below.  Pt reports prior to 11/2023 she was able to ambulate short distances using rollator and needing some assist for ADLs, but since needing increased assist and spending most of her time in the bed.   Currently requires mod assist +2 safety for bed mobility, total assist +2 to attempt partial stand, and min to total assist for ADLs.  Pt with R sided weakness and sensation deficits, but chronic BUE arthritic changes to hands/elbow (limited full elbow extension), impaired balance and decreased activity tolerance.  Based on performance today, believe patient will best benefit from continued OT services acutely and after dc at inpatient setting with <3hrs/day to optimize independence, safety with ADLs and mobility.     If plan is discharge home, recommend the following:   Two people to help with walking and/or transfers;Two people to help with bathing/dressing/bathroom;Assistance with cooking/housework;Assist for transportation;Help with stairs or ramp for entrance     Functional Status Assessment   Patient has had a recent decline in their functional status and demonstrates the ability to make significant improvements in function in a reasonable and predictable amount of time.     Equipment Recommendations   Other (comment) (defer)     Recommendations for  Other Services         Precautions/Restrictions   Precautions Precautions: Fall Recall of Precautions/Restrictions: Intact Restrictions Weight Bearing Restrictions Per Provider Order: No     Mobility Bed Mobility Overal bed mobility: Needs Assistance Bed Mobility: Supine to Sit, Sit to Supine, Rolling Rolling: Min assist   Supine to sit: Mod assist, +2 for safety/equipment Sit to supine: Mod assist, +2 for safety/equipment   General bed mobility comments: using bed pad, full assist to LB and scooting forward. pt able to assist with UB.  HOB elevated using rails    Transfers Overall transfer level: Needs assistance Equipment used: 2 person hand held assist Transfers: Sit to/from Stand Sit to Stand: Total assist, +2 physical assistance           General transfer comment: partial stand with total assist +2      Balance Overall balance assessment: Needs assistance Sitting-balance support: No upper extremity supported, Feet supported Sitting balance-Leahy Scale: Fair Sitting balance - Comments: statically contact guard to min assist, limited dynamically Postural control: Posterior lean Standing balance support: Bilateral upper extremity supported, During functional activity Standing balance-Leahy Scale: Zero                             ADL either performed or assessed with clinical judgement   ADL Overall ADL's : Needs assistance/impaired     Grooming: Sitting;Contact guard assist           Upper Body Dressing : Minimal assistance;Sitting   Lower Body Dressing: Total assistance;+2 for physical assistance;Sitting/lateral leans;Bed level  Toilet Transfer Details (indicate cue type and reason): deferred         Functional mobility during ADLs: Moderate assistance;+2 for safety/equipment (to EOB)       Vision Ability to See in Adequate Light: 1 Impaired Patient Visual Report: Blurring of vision Additional Comments: further assessment  needed pt with blurry vision and reports floaters     Perception         Praxis         Pertinent Vitals/Pain Pain Assessment Pain Assessment: Faces Faces Pain Scale: Hurts even more Pain Location: R hip, LEs with movement, back Pain Descriptors / Indicators: Discomfort, Guarding Pain Intervention(s): Limited activity within patient's tolerance, Monitored during session, Repositioned     Extremity/Trunk Assessment Upper Extremity Assessment Upper Extremity Assessment: RUE deficits/detail;LUE deficits/detail RUE Deficits / Details: grossly 3-/5, shoulder flexion to 90* only.  Pt reports decrased sensation.  Chronic arthritic changes to hands limiting FMC, and unable to fully extend elbows. RUE Sensation: decreased light touch RUE Coordination: decreased fine motor;decreased gross motor LUE Deficits / Details: grossly 3+/5 MMT, shoudler flexion to 90* only.  Chronic arthritic changes to hands limiting FMC and unable to fully extend elbows. LUE Coordination: decreased fine motor;decreased gross motor   Lower Extremity Assessment Lower Extremity Assessment: Defer to PT evaluation   Cervical / Trunk Assessment Cervical / Trunk Assessment: Normal   Communication Communication Communication: No apparent difficulties   Cognition Arousal: Alert Behavior During Therapy: WFL for tasks assessed/performed Cognition: No apparent impairments             OT - Cognition Comments: appears WFL, further assessment as needed                 Following commands: Intact       Cueing  General Comments   Cueing Techniques: Verbal cues;Tactile cues      Exercises     Shoulder Instructions      Home Living Family/patient expects to be discharged to:: Private residence Living Arrangements: Spouse/significant other Available Help at Discharge: Family;Available PRN/intermittently Type of Home: House Home Access: Ramped entrance     Home Layout: One level     Bathroom  Shower/Tub: Tub/shower unit;Sponge bathes at baseline   Bathroom Toilet: Standard Bathroom Accessibility: Yes   Home Equipment: Shower seat;Toilet riser;Wheelchair Financial trader (4 wheels);Wheelchair - power   Additional Comments: pt was at rehab for several days before coming to [facility]      Prior Functioning/Environment Prior Level of Function : Needs assist             Mobility Comments: Prior to sept.21 had assist to stand and walk with rollator, since then has been staying in the bed ADLs Comments: prior to sept 21 was able to manage ADLs with some assist, but since has been needing increased assist for dressing, bathing and toileting    OT Problem List: Decreased range of motion;Decreased strength;Decreased activity tolerance;Impaired balance (sitting and/or standing);Decreased knowledge of use of DME or AE;Decreased knowledge of precautions;Impaired sensation;Impaired UE functional use;Pain   OT Treatment/Interventions: Self-care/ADL training;Therapeutic exercise;DME and/or AE instruction;Therapeutic activities;Cognitive remediation/compensation;Patient/family education;Balance training;Neuromuscular education      OT Goals(Current goals can be found in the care plan section)   Acute Rehab OT Goals Patient Stated Goal: get better, back to rehab OT Goal Formulation: With patient Time For Goal Achievement: 12/16/23 Potential to Achieve Goals: Good   OT Frequency:  Min 2X/week    Co-evaluation PT/OT/SLP Co-Evaluation/Treatment: Yes Reason for Co-Treatment: For patient/therapist  safety;To address functional/ADL transfers PT goals addressed during session: Balance;Mobility/safety with mobility OT goals addressed during session: ADL's and self-care      AM-PAC OT 6 Clicks Daily Activity     Outcome Measure Help from another person eating meals?: A Little Help from another person taking care of personal grooming?: A Little Help from another person toileting, which  includes using toliet, bedpan, or urinal?: Total Help from another person bathing (including washing, rinsing, drying)?: A Lot Help from another person to put on and taking off regular upper body clothing?: A Little Help from another person to put on and taking off regular lower body clothing?: Total 6 Click Score: 13   End of Session Equipment Utilized During Treatment: Gait belt Nurse Communication: Mobility status  Activity Tolerance: Patient tolerated treatment well Patient left: in bed;with call bell/phone within reach;with bed alarm set  OT Visit Diagnosis: Other abnormalities of gait and mobility (R26.89);Muscle weakness (generalized) (M62.81);Pain;Other symptoms and signs involving the nervous system (R29.898) Pain - Right/Left: Right Pain - part of body:  (hip, back, B LEs)                Time: 1000-1017 OT Time Calculation (min): 17 min Charges:  OT General Charges $OT Visit: 1 Visit OT Evaluation $OT Eval Moderate Complexity: 1 Mod  Etta NOVAK, OT Acute Rehabilitation Services Office [phone] Secure Chat Preferred    Etta GORMAN Hope 12/02/2023, 10:39 AM

## 2023-12-02 NOTE — [place] FL2 (Signed)
 [place]  MEDICAID FL2 LEVEL OF CARE FORM     IDENTIFICATION  Patient Name: Andrea Wade Birthdate: October 16, 1970 Sex: female Admission Date (Current Location): 12/01/2023  [facility] and [place] Number:      Facility and Address:  [facility], [address], [place], [place] [address]      Provider Number: [id number]  Attending Physician Name and Address:  [name], *  Relative Name and Phone Number:  Armstrong,Jeffrey (Significant other)  [phone] [facility] Phone)    Current Level of Care: SNF Recommended Level of Care: Skilled Nursing Facility Prior Approval Number:    Date Approved/Denied:   PASRR Number: [id number]  Discharge Plan: SNF    Current Diagnoses: Patient Active Problem List   Diagnosis Date Noted   Stroke-like symptoms 12/01/2023   Essential hypertension 12/01/2023   Addison's disease (HCC) 12/01/2023   Hypothyroidism 12/01/2023   Anxiety and depression 12/01/2023   Syncope, vasovagal 08/09/2023   Breast tenderness in female 11/06/2021   Dizziness 09/02/2020   Group A streptococcal infection 09/01/2020   Diarrhea 09/01/2020   Leukocytosis 09/01/2020   Rheumatoid arthritis 09/01/2020   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   Influenza 04/16/2016   HCAP (healthcare-associated pneumonia) 04/16/2016   Hypotension 04/16/2016   Acute respiratory failure (HCC) 04/16/2016   Sepsis (HCC) 04/15/2016   Pyelonephritis 03/29/2016   Chronic pain 03/29/2016   Hypochloremia    Hyponatremia    Unspecified osteoarthritis, unspecified site 06/20/2015   Diabetes mellitus due to underlying condition without complications (HCC) 10/05/2013   Anemia, unspecified 10/05/2013   Hypokalemia 10/05/2013   Smoking 10/05/2013   Menorrhagia with regular cycle 10/05/2013   GERD (gastroesophageal reflux disease) 10/05/2013   Chest pain 04/17/2013   Seizure (HCC) 02/24/2013   Atypical chest pain  09/22/2012   Multiple sclerosis 09/22/2012   Noncompliance 09/22/2012   Tobacco abuse 09/22/2012   DM2 (diabetes mellitus, type 2) (HCC) 09/22/2012   Protein-calorie malnutrition, severe 09/22/2012    Orientation RESPIRATION BLADDER Height & Weight     Self, Time, Situation, Place  Normal Incontinent, External catheter Weight: 202 lb 13.2 oz (92 kg) Height:  5' 9 (175.3 cm)  BEHAVIORAL SYMPTOMS/MOOD NEUROLOGICAL BOWEL NUTRITION STATUS      Continent Diet (see d/c summary)  AMBULATORY STATUS COMMUNICATION OF NEEDS Skin   Extensive Assist Verbally Normal                       Personal Care Assistance Level of Assistance  Dressing, Feeding, Bathing Bathing Assistance: Maximum assistance Feeding assistance: Independent Dressing Assistance: Maximum assistance     Functional Limitations Info  Sight, Hearing, Speech Sight Info: Adequate Hearing Info: Adequate Speech Info: Adequate    SPECIAL CARE FACTORS FREQUENCY  OT (By licensed OT), PT (By licensed PT), Speech therapy     PT Frequency: 5x/week OT Frequency: 5x/week     Speech Therapy Frequency: 2-5x/week      Contractures Contractures Info: Not present    Additional Factors Info  Code Status, Allergies Code Status Info: full code Allergies Info: codeine, aspirin , latex, Peanut-containing Drug Products, pecan extrates, tape, penicillins, Shellfish-derived Products           Current Medications (12/02/2023):  This is the current hospital active medication list Current Facility-Administered Medications  Medication Dose Route Frequency Provider Last Rate Last Admin   0.9 %  sodium chloride  infusion   Intravenous Continuous Claudene, Rondell A, MD 40 mL/hr at  12/01/23 1829 New Bag at 12/01/23 1829   acetaminophen  (TYLENOL ) tablet 650 mg  650 mg Oral Q4H PRN Smith, Rondell A, MD       Or   acetaminophen  (TYLENOL ) 160 MG/5ML solution 650 mg  650 mg Per Tube Q4H PRN Smith, Rondell A, MD       Or   acetaminophen   (TYLENOL ) suppository 650 mg  650 mg Rectal Q4H PRN Claudene Maximino LABOR, MD       aspirin  EC tablet 81 mg  81 mg Oral Daily Claudene, Rondell A, MD   81 mg at 12/02/23 9170   baclofen  (LIORESAL ) tablet 20 mg  20 mg Oral Q8H PRN Smith, Rondell A, MD   20 mg at 12/02/23 9170   dicyclomine  (BENTYL ) capsule 10 mg  10 mg Oral Daily PRN Smith, Rondell A, MD       divalproex  (DEPAKOTE  ER) 24 hr tablet 1,000 mg  1,000 mg Oral QHS Smith, Rondell A, MD   1,000 mg at 12/01/23 2303   DULoxetine  (CYMBALTA ) DR capsule 60 mg  60 mg Oral BID Smith, Rondell A, MD   60 mg at 12/02/23 9170   enoxaparin  (LOVENOX ) injection 40 mg  40 mg Subcutaneous Q24H Smith, Rondell A, MD   40 mg at 12/02/23 9170   famotidine  (PEPCID ) tablet 20 mg  20 mg Oral QHS Smith, Rondell A, MD   20 mg at 12/01/23 2302   [START ON 12/04/2023] fentaNYL  (DURAGESIC ) 25 MCG/HR 1 patch  1 patch Transdermal Q72H Smith, Rondell A, MD       hydrALAZINE (APRESOLINE) injection 10 mg  10 mg Intravenous Q4H PRN Claudene Maximino A, MD       hydrocortisone  (CORTEF ) tablet 10 mg  10 mg Oral Daily Smith, Rondell A, MD   10 mg at 12/02/23 1208   And   hydrocortisone  (CORTEF ) tablet 5 mg  5 mg Oral QPM Smith, Rondell A, MD   5 mg at 12/01/23 1834   HYDROmorphone  (DILAUDID ) injection 0.5 mg  0.5 mg Intravenous Q4H PRN Smith, Rondell A, MD   0.5 mg at 12/02/23 9167   hydrOXYzine (ATARAX) tablet 25 mg  25 mg Oral TID PRN Smith, Rondell A, MD   25 mg at 12/02/23 0829   insulin  aspart (novoLOG ) injection 0-15 Units  0-15 Units Subcutaneous TID WC Smith, Rondell A, MD   8 Units at 12/02/23 1208   insulin  glargine (LANTUS ) injection 20 Units  20 Units Subcutaneous Daily Claudene Maximino A, MD   20 Units at 12/02/23 9170   levothyroxine (SYNTHROID) tablet 50 mcg  50 mcg Oral Q0600 Smith, Rondell A, MD   50 mcg at 12/02/23 0630   nortriptyline  (PAMELOR ) capsule 75 mg  75 mg Oral QHS Smith, Rondell A, MD       oxyCODONE  (Oxy IR/ROXICODONE ) immediate release tablet 5 mg  5 mg Oral  Q6H PRN Smith, Rondell A, MD   5 mg at 12/02/23 1218   pantoprazole  (PROTONIX ) EC tablet 40 mg  40 mg Oral Daily Smith, Rondell A, MD   40 mg at 12/02/23 0830   rosuvastatin  (CRESTOR ) tablet 5 mg  5 mg Oral QHS Smith, Rondell A, MD   5 mg at 12/01/23 2302   trimethobenzamide (TIGAN) injection 200 mg  200 mg Intramuscular Q6H PRN Smith, Rondell A, MD   200 mg at 12/02/23 9142     Discharge Medications: Please see discharge summary for a list of discharge medications.  Relevant Imaging Results:  Relevant Lab  Results:   Additional Information SS#: [id number]  Luann SHAUNNA Cumming, LCSW

## 2023-12-02 NOTE — Progress Notes (Signed)
 Pt refused topamax  at hs, reports she was taken off of this medication a while ago. When questioned by this Clinical research associate, pt requests to confirm with her husband.  Dr. Perri notified.

## 2023-12-02 NOTE — Plan of Care (Signed)
  Problem: Education: Goal: Knowledge of disease or condition will improve Outcome: Progressing Goal: Knowledge of patient specific risk factors will improve (DELETE if not current risk factor) Outcome: Progressing   Problem: Ischemic Stroke/TIA Tissue Perfusion: Goal: Complications of ischemic stroke/TIA will be minimized Outcome: Progressing

## 2023-12-02 NOTE — Evaluation (Signed)
 Physical Therapy Evaluation Patient Details Name: Andrea Wade MRN: [id number] DOB: November 28, 1970 Today's Date: 12/02/2023  History of Present Illness  Pt is a 53 y/o female presenting on 10/1 from SNF (there for rehab) with R sided facial droop and generalized weakness. 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 Xray R hip negative, CT negative, MRI pending.  PMH includes: MS, chronic pain, RA, seizure disorder, anemia, DM2, migraine, pna.   Clinical Impression  Pt supine in bed upon arrival and agreeable to PT eval. Prior to 09/25, pt needed assist to stand and ambulate a short distance with use of rollator. After being found on the ground, pt primarily stayed in the bed and was unable to mobilize. Pt presents with decreased alertness to painful and light touch in B LE, B LE weakness, impaired balance and decreased activity tolerance. Pt was able to slightly wiggle toes, however, was unable to contract quad or hip musculature. Attempted to stand twice with [facility] and [employer] with pt unable to reach upright posture with B LE's shaking. In today's session . Pt has intermittent level of assist available at home. Recommending post-acute rehab <3hrs to work towards independence with mobility. Pt would benefit from acute skilled PT with current functional limitations listed below (see PT Problem List). Acute PT to follow.         If plan is discharge home, recommend the following: A lot of help with walking and/or transfers;A lot of help with bathing/dressing/bathroom;Assistance with cooking/housework;Assist for transportation;Help with stairs or ramp for entrance   Can travel by private vehicle   No    Equipment Recommendations Other (comment) (TBD)     Functional Status Assessment Patient has had a recent decline in their functional status and demonstrates the ability to make significant  improvements in function in a reasonable and predictable amount of time.     Precautions / Restrictions Precautions Precautions: Fall Recall of Precautions/Restrictions: Intact Restrictions Weight Bearing Restrictions Per Provider Order: No      Mobility  Bed Mobility Overal bed mobility: Needs Assistance Bed Mobility: Supine to Sit, Sit to Supine, Rolling Rolling: Min assist   Supine to sit: Mod assist, +2 for safety/equipment Sit to supine: Mod assist, +2 for safety/equipment   General bed mobility comments: using bed pad, full assist to LB and scooting forward. pt able to assist with UB.  HOB elevated using rails    Transfers Overall transfer level: Needs assistance Equipment used: 2 person hand held assist Transfers: Sit to/from Stand Sit to Stand: Total assist, +2 physical assistance    General transfer comment: partial stand with total assist +2. Instability noted in B LE's      Modified Rankin (Stroke Patients Only) Modified Rankin (Stroke Patients Only) Pre-Morbid Rankin Score: Moderately severe disability Modified Rankin: Severe disability     Balance Overall balance assessment: Needs assistance, History of Falls Sitting-balance support: No upper extremity supported, Feet supported Sitting balance-Leahy Scale: Fair Sitting balance - Comments: statically contact guard to min assist, limited dynamically Postural control: Posterior lean Standing balance support: Bilateral upper extremity supported, During functional activity Standing balance-Leahy Scale: Zero        Pertinent Vitals/Pain Pain Assessment Pain Assessment: Faces Faces Pain Scale: Hurts even more Pain Location: R hip, LEs with movement, back Pain Descriptors / Indicators: Discomfort, Guarding Pain Intervention(s): Limited activity within patient's tolerance, Monitored during session, Repositioned  Home Living Family/patient expects to be discharged to:: Private residence Living  Arrangements: Spouse/significant other Available Help at Discharge: Family;Available PRN/intermittently Type of Home: House Home Access: Ramped entrance       Home Layout: One level Home Equipment: Shower seat;Toilet riser;Wheelchair Financial trader (4 wheels);Wheelchair - power Additional Comments: pt was at rehab for several days before coming to [facility]    Prior Function Prior Level of Function : Needs assist    Mobility Comments: Prior to sept.21 had assist to stand and walk with rollator, since then has been staying in the bed ADLs Comments: prior to sept 21 was able to manage ADLs with some assist, but since has been needing increased assist for dressing, bathing and toileting     Extremity/Trunk Assessment   Upper Extremity Assessment Upper Extremity Assessment: Defer to OT evaluation RUE Deficits / Details: grossly 3-/5, shoulder flexion to 90* only.  Pt reports decrased sensation.  Chronic arthritic changes to hands limiting FMC, and unable to fully extend elbows. RUE Sensation: decreased light touch RUE Coordination: decreased fine motor;decreased gross motor LUE Deficits / Details: grossly 3+/5 MMT, shoudler flexion to 90* only.  Chronic arthritic changes to hands limiting FMC and unable to fully extend elbows. LUE Coordination: decreased fine motor;decreased gross motor    Lower Extremity Assessment Lower Extremity Assessment: RLE deficits/detail;LLE deficits/detail RLE Deficits / Details: slightly able to wiggle toes, no active quad or hip contraction RLE Sensation: history of peripheral neuropathy;decreased proprioception;decreased light touch (not alert to painful stimuli) LLE Deficits / Details: slightly able to wiggle toes, no active quad or hip contraction LLE Sensation: decreased light touch;decreased proprioception;history of peripheral neuropathy (not alert to painful stimuli)    Cervical / Trunk Assessment Cervical / Trunk Assessment: Normal  Communication    Communication Communication: No apparent difficulties    Cognition Arousal: Alert Behavior During Therapy: WFL for tasks assessed/performed   PT - Cognitive impairments: No apparent impairments    Following commands: Intact       Cueing Cueing Techniques: Verbal cues, Tactile cues      PT Assessment Patient needs continued PT services  PT Problem List Decreased strength;Decreased activity tolerance;Decreased balance;Decreased mobility       PT Treatment Interventions DME instruction;Gait training;Functional mobility training;Therapeutic activities;Therapeutic exercise;Balance training;Neuromuscular re-education;Patient/family education    PT Goals (Current goals can be found in the Care Plan section)  Acute Rehab PT Goals Patient Stated Goal: to go back to rehab PT Goal Formulation: With patient Time For Goal Achievement: 12/16/23 Potential to Achieve Goals: Fair    Frequency Min 2X/week     Co-evaluation PT/OT/SLP Co-Evaluation/Treatment: Yes Reason for Co-Treatment: For patient/therapist safety;To address functional/ADL transfers PT goals addressed during session: Balance;Mobility/safety with mobility OT goals addressed during session: ADL's and self-care       AM-PAC PT 6 Clicks Mobility  Outcome Measure Help needed turning from your back to your side while in a flat bed without using bedrails?: A Little Help needed moving from lying on your back to sitting on the side of a flat bed without using bedrails?: Total Help needed moving to and from a bed to a chair (including a wheelchair)?: Total Help needed standing up from a chair using your arms (e.g., wheelchair or bedside chair)?: Total Help needed to walk in hospital room?: Total Help needed climbing 3-5 steps with a railing? : Total 6 Click Score: 8    End of Session Equipment Utilized During Treatment: Gait belt Activity Tolerance: Patient tolerated treatment well Patient  left: in bed;with call  bell/phone within reach;with bed alarm set Nurse Communication: Mobility status PT Visit Diagnosis: Unsteadiness on feet (R26.81);Other abnormalities of gait and mobility (R26.89);Muscle weakness (generalized) (M62.81)    Time: 1000-1017 PT Time Calculation (min) (ACUTE ONLY): 17 min   Charges:   PT Evaluation $PT Eval Moderate Complexity: 1 Mod   PT General Charges $$ ACUTE PT VISIT: 1 Visit       Kate ORN, PT, DPT Secure Chat Preferred  Rehab Office [phone]   Kate BRAVO Wendolyn 12/02/2023, 10:47 AM

## 2023-12-02 NOTE — Evaluation (Signed)
 Speech Language Pathology Evaluation Patient Details Name: Andrea Wade MRN: [id number] DOB: 03-Nov-1970 Today's Date: 12/02/2023 Time: 8742-8678 SLP Time Calculation (min) (ACUTE ONLY): 24 min  Problem List:  Patient Active Problem List   Diagnosis Date Noted   Stroke-like symptoms 12/01/2023   Essential hypertension 12/01/2023   Addison's disease (HCC) 12/01/2023   Hypothyroidism 12/01/2023   Anxiety and depression 12/01/2023   Syncope, vasovagal 08/09/2023   Breast tenderness in female 11/06/2021   Dizziness 09/02/2020   Group A streptococcal infection 09/01/2020   Diarrhea 09/01/2020   Leukocytosis 09/01/2020   Rheumatoid arthritis 09/01/2020   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   Influenza 04/16/2016   HCAP (healthcare-associated pneumonia) 04/16/2016   Hypotension 04/16/2016   Acute respiratory failure (HCC) 04/16/2016   Sepsis (HCC) 04/15/2016   Pyelonephritis 03/29/2016   Chronic pain 03/29/2016   Hypochloremia    Hyponatremia    Unspecified osteoarthritis, unspecified site 06/20/2015   Diabetes mellitus due to underlying condition without complications (HCC) 10/05/2013   Anemia, unspecified 10/05/2013   Hypokalemia 10/05/2013   Smoking 10/05/2013   Menorrhagia with regular cycle 10/05/2013   GERD (gastroesophageal reflux disease) 10/05/2013   Chest pain 04/17/2013   Seizure (HCC) 02/24/2013   Atypical chest pain 09/22/2012   Multiple sclerosis 09/22/2012   Noncompliance 09/22/2012   Tobacco abuse 09/22/2012   DM2 (diabetes mellitus, type 2) (HCC) 09/22/2012   Protein-calorie malnutrition, severe 09/22/2012   Past Medical History:  Past Medical History:  Diagnosis Date   Anemia    Anxiety    Arthritis    joints (04/17/2013)   Chronic back pain    all over (04/17/2013)   Crohn disease (HCC)    Daily headache    Depression    Diabetes type 2, uncontrolled    Epileptic (HCC)    had 1-2/night; started RX; last  one was 01/2014   Essential hypertension 12/01/2023   GERD (gastroesophageal reflux disease)    Migraine    q day (04/17/2013)   MS (multiple sclerosis)    Pneumonia    about 10 times in my lifetime (04/17/2013)   Past Surgical History:  Past Surgical History:  Procedure Laterality Date   CARDIAC CATHETERIZATION N/A 10/08/2015   Procedure: Left Heart Cath and Coronary Angiography;  Surgeon: Peter M Swaziland, MD;  Location: [facility] INVASIVE CV LAB;  Service: Cardiovascular;  Laterality: N/A;   CHOLECYSTECTOMY  1994   TUBAL LIGATION  1993   HPI:  53 yo presenting from SNF with new onset R facial droop, decreased sensation to BLE and delayed speech. CT Head without acute changes; MRI pending. PMH includes: MS with severe disability, anemia, anxiety, arthritis, chronic back pain, Crohn's disease, daily migraine headaches, depression, DM2, and epilepsy. Pt was recently admitted to [facility] with subsequent evaluation at [facility], d/c 9/30 with symptoms suspected to be from a functional neurological disorder. Previously completed the SLUMS with SLP in June 2025 and scored 19/30.   Assessment / Plan / Recommendation Clinical Impression  Pt describes declining cognition gradually over time, but with an acute worsening in function on the morning of admission. She also notes increased R facial droop that is impacting her speech. Her articulation is somewhat imprecise, but dysarthria is also characterized by slow rate and monotonous phonation. She seems to have good awareness of her abilities, describing changes specifically to her attention and memory. These were the two subtests administered from the [facility] that exhibited difficulty, with her scores falling within the range  of mild-moderate impairment. Pt describes some difficulty managing at home (was only at SNF one day PTA, otherwise had been living at home), concerning for reduced safety awareness, but she does describe good attempts at problem  solving. Pt would benefit from ongoing SLP f/u for cognition and communication given acute changes.    SLP Assessment  SLP Recommendation/Assessment: Patient needs continued Speech Language Pathology Services SLP Visit Diagnosis: Cognitive communication deficit (R41.841)     Assistance Recommended at Discharge  Frequent or constant Supervision/Assistance  Functional Status Assessment Patient has had a recent decline in their functional status and demonstrates the ability to make significant improvements in function in a reasonable and predictable amount of time.  Frequency and Duration min 2x/week  2 weeks      SLP Evaluation Cognition  Overall Cognitive Status: Impaired/Different from baseline Arousal/Alertness: Awake/alert Orientation Level: Oriented X4 Attention: Sustained Sustained Attention: Impaired Sustained Attention Impairment: Verbal basic;Verbal complex Memory: Impaired Memory Impairment: Retrieval deficit;Storage deficit Awareness: Appears intact Problem Solving: Appears intact Safety/Judgment: Appears intact       Comprehension  Auditory Comprehension Overall Auditory Comprehension: Appears within functional limits for tasks assessed    Expression Expression Primary Mode of Expression: Verbal Verbal Expression Overall Verbal Expression: Appears within functional limits for tasks assessed   Oral / Motor  Motor Speech Overall Motor Speech: Impaired (rate of speech) Respiration: Within functional limits Phonation: Other (comment) (monotonous) Resonance: Within functional limits Articulation: Impaired Level of Impairment: Conversation Intelligibility: Intelligible            Leita SAILOR., M.A. CCC-SLP Acute Rehabilitation Services Office: [phone]  Secure chat preferred  12/02/2023, 1:32 PM

## 2023-12-02 NOTE — TOC Progression Note (Addendum)
 Transition of Care [facility]) - Progression Note    Patient Details  Name: Andrea Wade MRN: [id number] Date of Birth: Jul 05, 1970  [facility] CM/SW Contact  Luann SHAUNNA [place], [place] Phone Number: 12/02/2023, 1:44 PM  Clinical Narrative:     Per chart review, seems that pt came from [facility]. CSW called facility to confirm pt can return; admissions person not available, left voicemail.   Met with pt and s/o bedside. Confirmed pt is from [facility] for shot[facility] ter rehab and that plan is to return at [facility]. They state that [facility] informed them that they would hold the bed.   SNF auth submitted in online portal. Ref# [id number].   Expected Discharge Plan: Skilled Nursing Facility Barriers to Discharge: English as a second language teacher, Continued Medical Work up               [facility]                                               Social Drivers of Health (SDOH) Interventions SDOH Screenings   Food Insecurity: No Food Insecurity (12/02/2023)  Housing: Low Risk  (12/02/2023)  Transportation Needs: No Transportation Needs (12/02/2023)  Utilities: Not At Risk (12/02/2023)  Tobacco Use: Medium Risk (12/01/2023)    Readmission Risk Interventions     No data to display

## 2023-12-02 NOTE — Care Management Obs Status (Addendum)
 MEDICARE OBSERVATION STATUS NOTIFICATION   Patient Details  Name: Andrea Wade MRN: [id number] Date of Birth: 23-Nov-1970   Medicare Observation Status Notification Given:  Yes verbally reviewed observation notice with Jon Na telephonically at [phone].  Will mail the copy to the patient home address.    Alton Tremblay 12/02/2023, 3:52 PM

## 2023-12-02 NOTE — Progress Notes (Signed)
 PROGRESS NOTE    Andrea Wade  FMW:[id number] DOB: 09-15-1970 DOA: 12/01/2023 PCP: Venancio Pock, PA-C  Chief Complaint  Patient presents with   Code Stroke    Brief Narrative:   Andrea Wade is Andrea Wade 53 y.o. female with medical history significant of multiple sclerosis, seizure disorder on Depakote  and Topamax , history of CVA, Crohn's disease, hypothyroidism, chronic migraines, chronic back pain, narcotic dependence, diabetes mellitus type 2, anxiety, depression, and rheumatoid arthritis who presented with new onset of right-sided facial droop, decreased sensation in bilateral legs, and delayed speech.   She experienced acute onset of slurred speech, Andrea Wade sensation of tongue swelling, and Andrea Wade feeling of her throat closing. These symptoms were accompanied by arm and leg weakness, sweating, nausea, and [place]oston Mandato general feeling of being unwell. The symptoms began after 10 AM today.  She was in her normal state of health when she woke up this morning.  Denies having any similar symptoms after Andrea Wade seizure before.   She has Andrea Wade history of seizures, with the last possible seizure occurring on 9/21 when she was found on the floor unconscious by her husband at their home and was subsequently taken to [facility].  Review of records note workup including MRI of the brain, cervical spine T-spine, and L-spine that did not show any acute cause for her weakness.  Routine EEG showed no elliptic form activity at baseline patient appears patient underwent extensive is bedbound.  She was transferred to [facility] on 9/25-9/30 prior to being discharged to to the rehab facility yesterday.   In the emergency department patient was seen as Andrea Wade code stroke.  CT scan of the head did not reveal any acute abnormality.  Patient was not Andrea Wade thrombolytics candidate due to clear cause of symptoms.  X-rays of the hip noted no acute abnormality. Vital signs were noted to be stable.  Labs noted sodium 133 and glucose  214.  Neurology recommended completion of stroke workup.  Patient had been given morphine  point milligrams IV and Zofran  4 mg IV.  Assessment & Plan:   Principal Problem:   Stroke-like symptoms Active Problems:   Multiple sclerosis   Chronic pain   DM2 (diabetes mellitus, type 2) (HCC)   Essential hypertension   Seizure (HCC)   Rheumatoid arthritis   Addison's disease (HCC)   Hypothyroidism   Anxiety and depression   GERD (gastroesophageal reflux disease)  Stroke-like symptoms Patient presents with acute onset of right-sided facial droop, changes in sensation in her lower extremities, and change in speech.  Initial CT scan of the head did not reveal any acute abnormality.  Patient was not thought to be [name] candidate for thrombolytics due to unclear cause of symptom..  Patient had extensive workup including MRIs and EEG just recently at [facility] and subsequently evaluated at [facility].  She was just discharged yesterday with thought symptoms caused by Andrea Wade functional neurologic disorder.  - Admit to Andrea Wade telemetry bed - Neurochecks - Check MRI of the brain with and without per neurology - Appreciate neurology consultative services, will follow-up for any further recommendations.  Recommending MRI with/without.  Holding off on EEG for now.  PT/OT/SLP.  Aspirin , depakote .  IVF.   Multiple sclerosis Chronic pain - Continue duloxetine , nortriptyline , fentanyl  patch, baclofen  as needed, oxycodone  as needed   Uncontrolled diabetes mellitus type 2, with long-term use of insulin  Last available hemoglobin A1c noted to be 7.9 when checked on 9/26. - Hypoglycemic protocols - Continue Tresiba   20 units daily - CBGs before every meal with moderate SSI - Adjust insulin  regimen as deemed medically appropriate   Hypertension BP ok today Continue to hold lisinopril  for now   Seizure disorder Patient thinks last possible seizure was possibly on 9/21 when she was found on the  floor unconscious by her husband  - Continue Depakote  - she reports she's not taking topamax    Rheumatoid arthritis Hold methotrexate, she's not taking this per med rec   Addison's disease - Continue hydrocortisone    Hypothyroidism - TSH wnl - Continue levothyroxine   Anxiety and depression - Continue duloxetine    GERD - Continue PPI    DVT prophylaxis: lovenox  Code Status: full Family Communication: noen Disposition:   Status is: Observation The patient remains OBS appropriate and will d/c before 2 midnights.   Consultants:  neurology  Procedures:  none  Antimicrobials:  Anti-infectives (From admission, onward)    None       Subjective: Symptoms Andrea Wade little better today  Objective: Vitals:   12/01/23 2358 12/02/23 0333 12/02/23 0904 12/02/23 1131  BP: 125/79 129/83 110/76 127/82  Pulse: 88  84 87  Resp: 18 18 18 17   Temp: 98 F (36.7 C) 97.9 F (36.6 C) 98.3 F (36.8 C) 98.1 F (36.7 C)  TempSrc: Oral Oral Oral Oral  SpO2: 95% 100% 92% 93%  Weight:      Height:        Intake/Output Summary (Last 24 hours) at 12/02/2023 1454 Last data filed at 12/02/2023 9065 Gross per 24 hour  Intake 0 ml  Output 900 ml  Net -900 ml   Filed Weights   12/01/23 1200  Weight: 92 kg    Examination:  General exam: Appears calm and comfortable  Respiratory system: unlabored Cardiovascular system: RRR Gastrointestinal system: Abdomen is nondistended, soft and nontender Central nervous system: R facial drop, R sided weakness - unable to lift bilateral LE off bed Extremities: no LEE    Data Reviewed: I have personally reviewed following labs and imaging studies  CBC: Recent Labs  Lab 12/01/23 1250 12/01/23 1255  WBC  --  7.3  NEUTROABS  --  3.3  HGB 15.3* 14.1  HCT 45.0 44.5  MCV  --  87.8  PLT  --  217    Basic Metabolic Panel: Recent Labs  Lab 12/01/23 1250 12/01/23 1255  NA 136 133*  K 4.4 4.4  CL 102 99  CO2  --  22  GLUCOSE 221*  214*  BUN 20 20  CREATININE 0.90 0.91  CALCIUM   --  9.0    GFR: Estimated Creatinine Clearance: 85.4 mL/min (by C-G formula based on SCr of 0.91 mg/dL).  Liver Function Tests: Recent Labs  Lab 12/01/23 1255  AST 20  ALT 16  ALKPHOS 60  BILITOT 0.8  PROT 7.6  ALBUMIN 3.1*    CBG: Recent Labs  Lab 12/01/23 1241 12/01/23 2125 12/02/23 0047 12/02/23 0617 12/02/23 1132  GLUCAP 206* 182* 189* 164* 260*     No results found for this or any previous visit (from the past 240 hours).       Radiology Studies: DG Hip Unilat W or Wo Pelvis 2-3 Views Right Result Date: 12/01/2023 CLINICAL DATA:  Hip pain. EXAM: DG HIP (WITH OR WITHOUT PELVIS) 2-3V RIGHT COMPARISON:  12/16/2022 FINDINGS: Mild diffuse decreased bone mineralization. 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 No acute fracture or dislocation evidence of previous  bilateral tubal ligation. Multiple pelvic phleboliths. IMPRESSION: 1. No acute findings. 2. 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 Electronically Signed   By: Toribio Agreste M.D.   On: 12/01/2023 15:44   CT HEAD CODE STROKE WO CONTRAST Result Date: 12/01/2023 CLINICAL DATA:  Code stroke. Neuro deficit, acute, stroke suspected. Additional history provided: Aphasia, decreased sensation in both legs, right-sided facial droop. EXAM: CT HEAD WITHOUT CONTRAST TECHNIQUE: Contiguous axial images were obtained from the base of the skull through the vertex without intravenous contrast. RADIATION DOSE REDUCTION: This exam was performed according to the departmental dose-optimization program which includes automated exposure control, adjustment of the mA and/or kV according to patient size and/or use of iterative reconstruction technique. COMPARISON:  Brain MRI 11/22/2023. FINDINGS: Brain: No age-advanced or lobar predominant cerebral atrophy. Redemonstrated chronic cortical/subcortical infarcts  within the temporal and occipital lobes on the left, as well as right occipital lobe. Background mild patchy and ill-defined hypoattenuation within the cerebral white matter, nonspecific but compatible with chronic small vessel ischemic disease. There is no acute intracranial hemorrhage. No acute demarcated cortical infarct. No extra-axial fluid collection. No evidence of an intracranial mass. No midline shift. Vascular: No hyperdense vessel. Atherosclerotic calcifications. Skull: No calvarial fracture or aggressive osseous lesion. Sinuses/Orbits: No mass or acute finding within the imaged orbits. No significant paranasal sinus disease. ASPECTS [facility] Stroke Program Early CT Score) - Ganglionic level infarction (caudate, lentiform nuclei, internal capsule, insula, M1-M3 cortex): 7 - Supraganglionic infarction (M4-M6 cortex): 3 Total score (0-10 with 10 being normal): 10 No evidence of an acute intracranial abnormality. These results were communicated to Dr. Merrianne at 1:16 pmon 10/1/2025by text page via the [facility] messaging system. IMPRESSION: 1. No evidence of an acute intracranial abnormality. 2. Chronic small vessel ischemic disease and chronic infarcts, as described. Electronically Signed   By: Rockey Childs D.O.   On: 12/01/2023 13:17        Scheduled Meds:  aspirin  EC  81 mg Oral Daily   divalproex   1,000 mg Oral QHS   DULoxetine   60 mg Oral BID   enoxaparin  (LOVENOX ) injection  40 mg Subcutaneous Q24H   famotidine   20 mg Oral QHS   [START ON 12/04/2023] fentaNYL   1 patch Transdermal Q72H   hydrocortisone   10 mg Oral Daily   And   hydrocortisone   5 mg Oral QPM   insulin  aspart  0-15 Units Subcutaneous TID WC   insulin  glargine  20 Units Subcutaneous Daily   levothyroxine  50 mcg Oral Q0600   nortriptyline   75 mg Oral QHS   pantoprazole   40 mg Oral Daily   rosuvastatin   5 mg Oral QHS   topiramate   200 mg Oral BID   Continuous Infusions:  sodium chloride  40 mL/hr at 12/01/23 1829      LOS: 0 days    Time spent: over 30 min     Meliton Monte, MD [facility]   To contact the attending provider between 7A-7P or the covering provider during after hours 7P-7A, please log into the web site [email or web address] and access using universal [place] password for that web site. If you do not have the password, please call the hospital operator.  12/02/2023, 2:54 PM

## 2023-12-03 DIAGNOSIS — G8220 Paraplegia, unspecified: Secondary | ICD-10-CM | POA: Diagnosis not present

## 2023-12-03 DIAGNOSIS — Z7401 Bed confinement status: Secondary | ICD-10-CM | POA: Diagnosis not present

## 2023-12-03 DIAGNOSIS — R0902 Hypoxemia: Secondary | ICD-10-CM | POA: Diagnosis not present

## 2023-12-03 DIAGNOSIS — R2990 Unspecified symptoms and signs involving the nervous system: Secondary | ICD-10-CM | POA: Diagnosis not present

## 2023-12-03 DIAGNOSIS — R29818 Other symptoms and signs involving the nervous system: Secondary | ICD-10-CM | POA: Diagnosis not present

## 2023-12-03 LAB — CBC WITH DIFFERENTIAL/PLATELET
Abs Immature Granulocytes: 0.05 K/uL (ref 0.00–0.07)
Basophils Absolute: 0.1 K/uL (ref 0.0–0.1)
Basophils Relative: 1 %
Eosinophils Absolute: 0.2 K/uL (ref 0.0–0.5)
Eosinophils Relative: 3 %
HCT: 38.8 % (ref 36.0–46.0)
Hemoglobin: 12.5 g/dL (ref 12.0–15.0)
Immature Granulocytes: 1 %
Lymphocytes Relative: 40 %
Lymphs Abs: 3.1 K/uL (ref 0.7–4.0)
MCH: 28.2 pg (ref 26.0–34.0)
MCHC: 32.2 g/dL (ref 30.0–36.0)
MCV: 87.6 fL (ref 80.0–100.0)
Monocytes Absolute: 0.9 K/uL (ref 0.1–1.0)
Monocytes Relative: 12 %
Neutro Abs: 3.4 K/uL (ref 1.7–7.7)
Neutrophils Relative %: 43 %
Platelets: 194 K/uL (ref 150–400)
RBC: 4.43 MIL/uL (ref 3.87–5.11)
RDW: 16.8 % — ABNORMAL HIGH (ref 11.5–15.5)
WBC: 7.7 K/uL (ref 4.0–10.5)
nRBC: 0 % (ref 0.0–0.2)

## 2023-12-03 LAB — COMPREHENSIVE METABOLIC PANEL WITH GFR
ALT: 20 U/L (ref 0–44)
AST: 27 U/L (ref 15–41)
Albumin: 2.6 g/dL — ABNORMAL LOW (ref 3.5–5.0)
Alkaline Phosphatase: 55 U/L (ref 38–126)
Anion gap: 10 (ref 5–15)
BUN: 24 mg/dL — ABNORMAL HIGH (ref 6–20)
CO2: 23 mmol/L (ref 22–32)
Calcium: 8.7 mg/dL — ABNORMAL LOW (ref 8.9–10.3)
Chloride: 101 mmol/L (ref 98–111)
Creatinine, Ser: 0.91 mg/dL (ref 0.44–1.00)
GFR, Estimated: >60 mL/min (ref >60)
Glucose, Bld: 234 mg/dL — ABNORMAL HIGH (ref 70–99)
Potassium: 4.3 mmol/L (ref 3.5–5.1)
Sodium: 134 mmol/L — ABNORMAL LOW (ref 135–145)
Total Bilirubin: 0.6 mg/dL (ref 0.0–1.2)
Total Protein: 6.8 g/dL (ref 6.5–8.1)

## 2023-12-03 LAB — GLUCOSE, CAPILLARY
Glucose-Capillary: 231 mg/dL — ABNORMAL HIGH (ref 70–99)
Glucose-Capillary: 284 mg/dL — ABNORMAL HIGH (ref 70–99)

## 2023-12-03 LAB — MAGNESIUM: Magnesium: 1.7 mg/dL (ref 1.7–2.4)

## 2023-12-03 LAB — PHOSPHORUS: Phosphorus: 4.4 mg/dL (ref 2.5–4.6)

## 2023-12-03 MED ORDER — INSULIN ASPART 100 UNIT/ML IJ SOLN: 0.0000 [IU] | Freq: Three times a day (TID) | INTRAMUSCULAR | Status: AC

## 2023-12-03 MED ORDER — FENTANYL 25 MCG/HR TD PT72: 1.0000 | MEDICATED_PATCH | TRANSDERMAL | 0 refills | Status: AC

## 2023-12-03 MED ORDER — TRESIBA FLEXTOUCH 100 UNIT/ML ~~LOC~~ SOPN: 24.0000 [IU] | PEN_INJECTOR | Freq: Every day | SUBCUTANEOUS | Status: AC

## 2023-12-03 MED ORDER — OXYCODONE HCL 5 MG PO TABS: 5.0000 mg | ORAL_TABLET | Freq: Four times a day (QID) | ORAL | 0 refills | Status: AC | PRN

## 2023-12-03 MED ORDER — INSULIN GLARGINE 100 UNIT/ML ~~LOC~~ SOLN: 24.0000 [IU] | Freq: Every day | SUBCUTANEOUS | Status: DC

## 2023-12-03 NOTE — Discharge Summary (Signed)
 Physician Discharge Summary  Andrea Wade FMW:[id number] DOB: 1970/09/29 DOA: 12/01/2023  PCP: Venancio Pock, PA-C  Admit date: 12/01/2023 Discharge date: 12/03/2023  Time spent: 40 minutes  Recommendations for Outpatient Follow-up:  Follow outpatient CBC/CMP  Follow with neurology outpatient Adjust diabetes regimen as needed outpatient Caution with pain meds, watch for over sedation    Discharge Diagnoses:  Principal Problem:   Stroke-like symptoms Active Problems:   Multiple sclerosis   Chronic pain   DM2 (diabetes mellitus, type 2) (HCC)   Essential hypertension   Seizure (HCC)   Rheumatoid arthritis   Addison's disease (HCC)   Hypothyroidism   Anxiety and depression   GERD (gastroesophageal reflux disease)   Discharge Condition: stable  Diet recommendation: heart healthy, diabetic  Filed Weights   12/01/23 1200  Weight: 92 kg    History of present illness:    Andrea Wade is Andrea Wade 53 y.o. female with medical history significant of multiple sclerosis, seizure disorder on Depakote  and Topamax , history of CVA, Crohn's disease, hypothyroidism, chronic migraines, chronic back pain, narcotic dependence, diabetes mellitus type 2, anxiety, depression, and rheumatoid arthritis who presented with new onset of right-sided facial droop, decreased sensation in bilateral legs, and delayed speech.   She experienced acute onset of slurred speech, Carrine Kroboth sensation of tongue swelling, and Alvira Hecht feeling of her throat closing. These symptoms were accompanied by arm and leg weakness, sweating, nausea, and Liam Cammarata general feeling of being unwell. The symptoms began after 10 AM today.  She was in her normal state of health when she woke up this morning.  Denies having any similar symptoms after Carri Spillers seizure before.   She has Keigen Caddell history of seizures, with the last possible seizure occurring on 9/21 when she was found on the floor unconscious by her husband at their home and was subsequently taken to  [facility].  Review of records note workup including MRI of the brain, cervical spine T-spine, and L-spine that did not show any acute cause for her weakness.  Routine EEG showed no elliptic form activity at baseline patient appears patient underwent extensive is bedbound.  She was transferred to [facility] on 9/25-9/30 prior to being discharged to to the rehab facility yesterday.   She was discharged on 9/30 from [facility] with concern that her symptoms maybe caused by Derik Fults functional neurologic disorder.   In the emergency department patient was seen as Amyria Komar code stroke.  CT scan of the head did not reveal any acute abnormality.  Neurology recommended MRI with/without contrast, but she didn't tolerate the exam -> MRI without contrast was without acute infarct.  Neurology has now signed off.  She remains stable for discharge, would recommend continued outpatient neurology follow up.   Hospital Course:  Assessment and Plan:  Stroke-like symptoms Patient presented with acute onset of right-sided facial droop, changes in sensation in her lower extremities, and change in speech.  Initial CT scan of the head did not reveal any acute abnormality.  Patient was not thought to be Konnor Jorden candidate for thrombolytics due to unclear cause of symptom..  Patient had extensive workup including MRIs and EEG just recently at [facility] and subsequently evaluated at [facility].  She was just discharged on the day prior to admission with thought symptoms caused by Tabitha Riggins functional neurologic disorder.   - MRI brain without acute infarct, encephalomalacia in the posterior L temporal lobe (patient terminated exam before completion, no IV contrast given) - normal TSH, UDS with  opiates - no fever or leukocytosis to suggest infection -  she had MRI C/T/L spine at [facility] on 9/23 (T spine notable for normal spinal cord, mild degenerative changes in upper thoracic spine in upper thoracic spine with  L foraminal narrowing - chronic deformity of T2 endplate.  Multilevel degenerative changes in lumbar spine, most prominent to L L4-5 and R L3-4.  C spine with mild degenerative changes without significant foraminal or canal stenosis.  - note per recent discharge summary, her bilateral LE weakness in 2023 was worked up extensively with normal NCS/EMG/US .   - Appreciate neurology consultative services, will follow-up for any further recommendations.  They've signed off today.  Noted sedation related to meds may have played Orvill Coulthard role.  Continue depakote  and aspirin .  - Therapy recommending SNF Recommending MRI with/without.   - would recommend continued neurology follow up outpatient   Multiple sclerosis Chronic pain - Continue duloxetine , nortriptyline , fentanyl  patch, baclofen  as needed, oxycodone  as needed   Uncontrolled diabetes mellitus type 2, with long-term use of insulin  Last available hemoglobin A1c noted to be 7.9 when checked on 9/26. - increase basal insulin  to 24 units - continue SSI  - follow and adjust outpatient as needed   Hypertension Not on any BP meds   Seizure disorder Patient thinks last possible seizure was possibly on 9/21 when she was found on the floor unconscious by her husband  - Continue Depakote , she notes she is taking topamax  today   Rheumatoid arthritis On my review with her, she notes she is taking methotrexate and sees outpatient rheumatology   Addison's disease - Continue hydrocortisone    Hypothyroidism - TSH wnl - Continue levothyroxine   Anxiety and depression - Continue duloxetine    GERD - Continue PPI     Procedures: none   Consultations: neurology  Discharge Exam: Vitals:   12/03/23 0746 12/03/23 1104  BP: 103/70 118/71  Pulse: 74 79  Resp: 16 18  Temp: 98.4 F (36.9 C) 98.4 F (36.9 C)  SpO2: 96% 96%   See progress note Discussed plan to discharge back to SNF  General: No acute distress. Cardiovascular: RRR Lungs:  unlabored Neurological: generalized weakness, poor effort? Bilateral LE weakness - RUE weakness, R facial droop. Extremities: No clubbing or cyanosis. No edema.   Discharge Instructions   Discharge Instructions     Call MD for:  difficulty breathing, headache or visual disturbances   Complete by: As directed    Call MD for:  extreme fatigue   Complete by: As directed    Call MD for:  hives   Complete by: As directed    Call MD for:  persistant dizziness or light-headedness   Complete by: As directed    Call MD for:  persistant nausea and vomiting   Complete by: As directed    Call MD for:  redness, tenderness, or signs of infection (pain, swelling, redness, odor or green/yellow discharge around incision site)   Complete by: As directed    Call MD for:  severe uncontrolled pain   Complete by: As directed    Call MD for:  temperature >100.4   Complete by: As directed    Diet - low sodium heart healthy   Complete by: As directed    Discharge instructions   Complete by: As directed    You were seen for new neurologic symptoms.  Your MRI brain was reassuringly negative for acute findings.    We'll plan to discharge you back to the skilled nursing  facility for rehab.  You should continue to follow up with neurology as an outpatient for your symptoms.  Return for new, recurrent, or worsening symptoms.  Please ask your PCP to request records from this hospitalization so they know what was done and what the next steps will be.   Increase activity slowly   Complete by: As directed       Allergies as of 12/03/2023       Reactions   Codeine Hives, Itching, Palpitations   Peanut-containing Drug Products Anaphylaxis, Itching   Aspirin  Itching   High doses only-baby is ok   Latex Dermatitis   Pecan Extract Itching   Penicillins Hives, Itching, Nausea And Vomiting   Allergy from childhood Has patient had Ruthene Methvin PCN reaction causing immediate rash, facial/tongue/throat swelling, SOB or  lightheadedness with hypotension: Yes Has patient had Kathleene Bergemann PCN reaction causing severe rash involving mucus membranes or skin necrosis: Unk Has patient had Zadok Holaway PCN reaction that required hospitalization: Unk Has patient had Elvia Aydin PCN reaction occurring within the last 10 years: No If all of the above answers are NO, then may proceed with Cephalosporin use.   Shellfish-derived Products Rash, Swelling   Tape Other (See Comments)   Slight irritation Other reaction(s): Other (See Comments) Slight irritation        Medication List     STOP taking these medications    insulin  lispro 100 UNIT/ML injection Commonly known as: HUMALOG       TAKE these medications    aspirin  EC 81 MG tablet Take 1 tablet (81 mg total) by mouth daily. Swallow whole.   baclofen  20 MG tablet Commonly known as: LIORESAL  Take 20 mg by mouth every 8 (eight) hours as needed for muscle spasms.   dicyclomine  10 MG capsule Commonly known as: BENTYL  Take 10 mg by mouth daily as needed for spasms (IBS symptoms).   divalproex  500 MG 24 hr tablet Commonly known as: DEPAKOTE  ER Take 1,000 mg by mouth at bedtime.   DULoxetine  60 MG capsule Commonly known as: CYMBALTA  Take 60 mg by mouth 2 (two) times daily.   Empagliflozin-metFORMIN  HCl ER 12.06-998 MG Tb24 Take 1 tablet by mouth daily.   esomeprazole 40 MG capsule Commonly known as: NEXIUM Take 40 mg by mouth daily.   famotidine  20 MG tablet Commonly known as: PEPCID  Take 20 mg by mouth at bedtime.   fentaNYL  25 MCG/HR Commonly known as: DURAGESIC  1 patch every 3 (three) days.   folic acid  1 MG tablet Commonly known as: FOLVITE  Take 1 mg by mouth daily.   hydrocortisone  5 MG tablet Commonly known as: CORTEF  Take 2 tablets (10 mg total) by mouth daily AND 1 tablet (5 mg total) every evening. Follow up with endocrinology outpatient for refills. What changed: See the new instructions.   hydrOXYzine 25 MG tablet Commonly known as: ATARAX Take 25 mg  by mouth 3 (three) times daily as needed for anxiety.   insulin  aspart 100 UNIT/ML injection Commonly known as: novoLOG  Inject 0-15 Units into the skin 3 (three) times daily with meals. CBG < 70: treat low blood sugar CBG 70 - 120: 0 units  CBG 121 - 150: 2 units  CBG 151 - 200: 3 units  CBG 201 - 250: 5 units  CBG 251 - 300: 8 units  CBG 301 - 350: 11 units  CBG 351 - 400: 15 units  CBG > 400: call MD What changed:  how much to take additional instructions   levothyroxine 50  MCG tablet Commonly known as: SYNTHROID Take 50 mcg by mouth daily before breakfast.   methotrexate 7.5 MG tablet Commonly known as: RHEUMATREX Take 7.5 mg by mouth once Geneive Sandstrom week. Every Monday Caution Chemotherapy. Protect from light. Start taking on: December 06, 2023   nortriptyline  75 MG capsule Commonly known as: PAMELOR  Take 75 mg by mouth at bedtime.   ondansetron  8 MG disintegrating tablet Commonly known as: ZOFRAN -ODT Take 8 mg by mouth 3 (three) times daily as needed for nausea.   ondansetron  8 MG tablet Commonly known as: ZOFRAN  Take 8 mg by mouth every 8 (eight) hours as needed for nausea or vomiting.   oxyCODONE  5 MG immediate release tablet Commonly known as: Oxy IR/ROXICODONE  Take 5 mg by mouth every 6 (six) hours as needed for moderate pain (pain score 4-6).   Ozempic (1 MG/DOSE) 4 MG/3ML Sopn Generic drug: Semaglutide (1 MG/DOSE) Inject 1 mg into the skin once Kysha Muralles week.   Rimegepant Sulfate 75 MG Tbdp Take 75 mg by mouth every other day.   rosuvastatin  5 MG tablet Commonly known as: CRESTOR  Take 5 mg by mouth at bedtime.   topiramate  200 MG tablet Commonly known as: TOPAMAX  Take 200 mg by mouth 2 (two) times daily.   Tresiba  FlexTouch 100 UNIT/ML FlexTouch Pen Generic drug: insulin  degludec Inject 24 Units into the skin daily. What changed: how much to take       Allergies  Allergen Reactions   Codeine Hives, Itching and Palpitations   Peanut-Containing Drug Products  Anaphylaxis and Itching   Aspirin  Itching    High doses only-baby is ok   Latex Dermatitis   Pecan Extract Itching   Penicillins Hives, Itching and Nausea And Vomiting    Allergy from childhood Has patient had Elianah Karis PCN reaction causing immediate rash, facial/tongue/throat swelling, SOB or lightheadedness with hypotension: Yes Has patient had Jefferey Lippmann PCN reaction causing severe rash involving mucus membranes or skin necrosis: Unk Has patient had Roel Douthat PCN reaction that required hospitalization: Unk Has patient had Khandi Kernes PCN reaction occurring within the last 10 years: No If all of the above answers are NO, then may proceed with Cephalosporin use.     Shellfish-Derived Products Rash and Swelling   Tape Other (See Comments)    Slight irritation Other reaction(s): Other (See Comments) Slight irritation      The results of significant diagnostics from this hospitalization (including imaging, microbiology, ancillary and laboratory) are listed below for reference.    Significant Diagnostic Studies: MR BRAIN WO CONTRAST Result Date: 12/02/2023 EXAM: MRI BRAIN WITHOUT CONTRAST 12/02/2023 06:14:01 PM TECHNIQUE: Multiplanar multisequence MRI of the head/brain was performed without the administration of intravenous contrast. The patient terminated the examination prior to completion. Diffusion-weighted imaging, sagittal T1-weighted imaging, and axial T2-weighted imaging were obtained. COMPARISON: Brain MRI dated 11/22/2023. CLINICAL HISTORY: Neuro deficit, acute, stroke suspected. FINDINGS: BRAIN AND VENTRICLES: No acute infarct. Unchanged encephalomalacia in the posterior left temporal lobe. No intracranial hemorrhage. No mass. No midline shift. No hydrocephalus. The sella is unremarkable. Normal flow voids. ORBITS: No acute abnormality. SINUSES AND MASTOIDS: No acute abnormality. BONES AND SOFT TISSUES: Normal marrow signal. No acute soft tissue abnormality. IMPRESSION: 1. No acute infarct. 2. Unchanged  encephalomalacia in the posterior left temporal lobe. 3. Truncated examination Electronically signed by: Franky Stanford MD 12/02/2023 09:04 PM EDT RP Workstation: [id number]   DG Hip Unilat W or Wo Pelvis 2-3 Views Right Result Date: 12/01/2023 CLINICAL DATA:  Hip pain. EXAM: DG HIP (WITH OR WITHOUT  PELVIS) 2-3V RIGHT COMPARISON:  12/16/2022 FINDINGS: Mild diffuse decreased bone mineralization. 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 No acute fracture or dislocation evidence of previous bilateral tubal ligation. Multiple pelvic phleboliths. IMPRESSION: 1. No acute findings. 2. 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 Electronically Signed   By: Toribio Agreste M.D.   On: 12/01/2023 15:44   CT HEAD CODE STROKE WO CONTRAST Result Date: 12/01/2023 CLINICAL DATA:  Code stroke. Neuro deficit, acute, stroke suspected. Additional history provided: Aphasia, decreased sensation in both legs, right-sided facial droop. EXAM: CT HEAD WITHOUT CONTRAST TECHNIQUE: Contiguous axial images were obtained from the base of the skull through the vertex without intravenous contrast. RADIATION DOSE REDUCTION: This exam was performed according to the departmental dose-optimization program which includes automated exposure control, adjustment of the mA and/or kV according to patient size and/or use of iterative reconstruction technique. COMPARISON:  Brain MRI 11/22/2023. FINDINGS: Brain: No age-advanced or lobar predominant cerebral atrophy. Redemonstrated chronic cortical/subcortical infarcts within the temporal and occipital lobes on the left, as well as right occipital lobe. Background mild patchy and ill-defined hypoattenuation within the cerebral white matter, nonspecific but compatible with chronic small vessel ischemic disease. There is no acute intracranial hemorrhage. No acute demarcated cortical infarct. No extra-axial fluid collection. No evidence  of an intracranial mass. No midline shift. Vascular: No hyperdense vessel. Atherosclerotic calcifications. Skull: No calvarial fracture or aggressive osseous lesion. Sinuses/Orbits: No mass or acute finding within the imaged orbits. No significant paranasal sinus disease. ASPECTS [facility] Stroke Program Early CT Score) - Ganglionic level infarction (caudate, lentiform nuclei, internal capsule, insula, M1-M3 cortex): 7 - Supraganglionic infarction (M4-M6 cortex): 3 Total score (0-10 with 10 being normal): 10 No evidence of an acute intracranial abnormality. These results were communicated to Dr. Merrianne at 1:16 pmon 10/1/2025by text page via the [facility] messaging system. IMPRESSION: 1. No evidence of an acute intracranial abnormality. 2. Chronic small vessel ischemic disease and chronic infarcts, as described. Electronically Signed   By: Rockey Childs D.O.   On: 12/01/2023 13:17    Microbiology: No results found for this or any previous visit (from the past 240 hours).   Labs: Basic Metabolic Panel: Recent Labs  Lab 12/01/23 1250 12/01/23 1255 12/03/23 0148  NA 136 133* 134*  K 4.4 4.4 4.3  CL 102 99 101  CO2  --  22 23  GLUCOSE 221* 214* 234*  BUN 20 20 24*  CREATININE 0.90 0.91 0.91  CALCIUM   --  9.0 8.7*  MG  --   --  1.7  PHOS  --   --  4.4   Liver Function Tests: Recent Labs  Lab 12/01/23 1255 12/03/23 0148  AST 20 27  ALT 16 20  ALKPHOS 60 55  BILITOT 0.8 0.6  PROT 7.6 6.8  ALBUMIN 3.1* 2.6*   No results for input(s): LIPASE, AMYLASE in the last 168 hours. No results for input(s): AMMONIA in the last 168 hours. CBC: Recent Labs  Lab 12/01/23 1250 12/01/23 1255 12/03/23 0148  WBC  --  7.3 7.7  NEUTROABS  --  3.3 3.4  HGB 15.3* 14.1 12.5  HCT 45.0 44.5 38.8  MCV  --  87.8 87.6  PLT  --  217 194   Cardiac Enzymes: No results for input(s): CKTOTAL, CKMB, CKMBINDEX, TROPONINI in the last 168 hours. BNP: BNP (last 3 results) No results for input(s):  BNP in the last 8760  hours.  ProBNP (last 3 results) No results for input(s): PROBNP in the last 8760 hours.  CBG: Recent Labs  Lab 12/02/23 1132 12/02/23 1722 12/02/23 2124 12/03/23 0609 12/03/23 1253  GLUCAP 260* 140* 226* 231* 284*       Signed:  Meliton Monte MD.  [facility] 12/03/2023, 2:16 PM

## 2023-12-03 NOTE — TOC Transition Note (Signed)
 Transition of Care [facility]) - Discharge Note   Patient Details  Name: Andrea Wade MRN: [id number] Date of Birth: 1970-10-05  [facility] CM/SW Contact:  Luann SHAUNNA Cumming, LCSW Phone Number: 12/03/2023, 1:52 PM   Clinical Narrative:      SNF auth approved 12/03/2023-12/07/2023 Ref#  [id number]  Per MD patient ready for DC to [facility]. RN, patient, patient's family, and facility notified of DC. Discharge Summary and FL2 sent to facility. RN to call report prior to discharge [phone]). DC packet on chart. Ambulance transport requested for patient.   CSW will sign off for now as social work intervention is no longer needed. Please consult us  again if new needs arise.    Final next level of care: Skilled Nursing Facility Barriers to Discharge: No Barriers Identified     Discharge Placement              Patient chooses bed at: [facility] Patient to be transferred to facility by: [facility]   Patient and family notified of of transfer: 12/03/23  Discharge Plan and Services Additional resources added to the After Visit Summary for                                       Social Drivers of Health (SDOH) Interventions SDOH Screenings   Food Insecurity: No Food Insecurity (12/02/2023)  Housing: Low Risk  (12/02/2023)  Transportation Needs: No Transportation Needs (12/02/2023)  Utilities: Not At Risk (12/02/2023)  Tobacco Use: Medium Risk (12/01/2023)     Readmission Risk Interventions     No data to display

## 2023-12-03 NOTE — Plan of Care (Signed)
 Problem: Education: Goal: Knowledge of disease or condition will improve Outcome: Adequate for Discharge Goal: Knowledge of secondary prevention will improve (MUST DOCUMENT ALL) Outcome: Adequate for Discharge Goal: Knowledge of patient specific risk factors will improve (DELETE if not current risk factor) Outcome: Adequate for Discharge   Problem: Ischemic Stroke/TIA Tissue Perfusion: Goal: Complications of ischemic stroke/TIA will be minimized Outcome: Adequate for Discharge   Problem: Coping: Goal: Will verbalize positive feelings about self Outcome: Adequate for Discharge Goal: Will identify appropriate support needs Outcome: Adequate for Discharge   Problem: Health Behavior/Discharge Planning: Goal: Ability to manage health-related needs will improve Outcome: Adequate for Discharge Goal: Goals will be collaboratively established with patient/family Outcome: Adequate for Discharge   Problem: Self-Care: Goal: Ability to participate in self-care as condition permits will improve Outcome: Adequate for Discharge Goal: Verbalization of feelings and concerns over difficulty with self-care will improve Outcome: Adequate for Discharge Goal: Ability to communicate needs accurately will improve Outcome: Adequate for Discharge   Problem: Nutrition: Goal: Risk of aspiration will decrease Outcome: Adequate for Discharge Goal: Dietary intake will improve Outcome: Adequate for Discharge   Problem: Education: Goal: Ability to describe self-care measures that may prevent or decrease complications (Diabetes Survival Skills Education) will improve Outcome: Adequate for Discharge Goal: Individualized Educational Video(s) Outcome: Adequate for Discharge   Problem: Coping: Goal: Ability to adjust to condition or change in health will improve Outcome: Adequate for Discharge   Problem: Fluid Volume: Goal: Ability to maintain a balanced intake and output will improve Outcome: Adequate  for Discharge   Problem: Health Behavior/Discharge Planning: Goal: Ability to identify and utilize available resources and services will improve Outcome: Adequate for Discharge Goal: Ability to manage health-related needs will improve Outcome: Adequate for Discharge   Problem: Metabolic: Goal: Ability to maintain appropriate glucose levels will improve Outcome: Adequate for Discharge   Problem: Nutritional: Goal: Maintenance of adequate nutrition will improve Outcome: Adequate for Discharge Goal: Progress toward achieving an optimal weight will improve Outcome: Adequate for Discharge   Problem: Skin Integrity: Goal: Risk for impaired skin integrity will decrease Outcome: Adequate for Discharge   Problem: Tissue Perfusion: Goal: Adequacy of tissue perfusion will improve Outcome: Adequate for Discharge   Problem: Education: Goal: Knowledge of General Education information will improve Description: Including pain rating scale, medication(s)/side effects and non-pharmacologic comfort measures Outcome: Adequate for Discharge   Problem: Health Behavior/Discharge Planning: Goal: Ability to manage health-related needs will improve Outcome: Adequate for Discharge   Problem: Clinical Measurements: Goal: Ability to maintain clinical measurements within normal limits will improve Outcome: Adequate for Discharge Goal: Will remain free from infection Outcome: Adequate for Discharge Goal: Diagnostic test results will improve Outcome: Adequate for Discharge Goal: Respiratory complications will improve Outcome: Adequate for Discharge Goal: Cardiovascular complication will be avoided Outcome: Adequate for Discharge   Problem: Activity: Goal: Risk for activity intolerance will decrease Outcome: Adequate for Discharge   Problem: Nutrition: Goal: Adequate nutrition will be maintained Outcome: Adequate for Discharge   Problem: Coping: Goal: Level of anxiety will decrease Outcome:  Adequate for Discharge   Problem: Elimination: Goal: Will not experience complications related to bowel motility Outcome: Adequate for Discharge Goal: Will not experience complications related to urinary retention Outcome: Adequate for Discharge   Problem: Pain Managment: Goal: General experience of comfort will improve and/or be controlled Outcome: Adequate for Discharge   Problem: Safety: Goal: Ability to remain free from injury will improve Outcome: Adequate for Discharge   Problem: Skin Integrity: Goal: Risk for  impaired skin integrity will decrease Outcome: Adequate for Discharge   Problem: Acute Rehab PT Goals(only PT should resolve) Goal: Pt will Roll Supine to Side Outcome: Adequate for Discharge Goal: Pt Will Go Supine/Side To Sit Outcome: Adequate for Discharge Goal: Patient Will Perform Sitting Balance Outcome: Adequate for Discharge Goal: Patient Will Transfer Sit To/From Stand Outcome: Adequate for Discharge Goal: Pt Will Transfer Bed To Chair/Chair To Bed Outcome: Adequate for Discharge Goal: Pt Will Ambulate Outcome: Adequate for Discharge   Problem: SLP Cognition Goals Goal: Patient will demonstrate attention to functional Description: Patient will demonstrate attention to functional task with Outcome: Adequate for Discharge Goal: Patient will utilize external memory aids Description: Patient will utilize external memory aids to facilitate recall of information for improved safety with Outcome: Adequate for Discharge   Problem: Acute Rehab OT Goals (only OT should resolve) Goal: Pt. Will Perform Grooming Outcome: Adequate for Discharge Goal: Pt. Will Perform Upper Body Dressing Outcome: Adequate for Discharge Goal: Pt. Will Transfer To Toilet Outcome: Adequate for Discharge Goal: OT Additional ADL Goal #1 Outcome: Adequate for Discharge   Problem: SLP Language Goals Goal: Patient will utilize speech intelligibility Description: Patient will  utilize speech intelligibility strategies to  enhance communication with Outcome: Adequate for Discharge

## 2023-12-03 NOTE — Progress Notes (Signed)
 Patient d/c to  [facility] , patient transported by [facility] services. Attempted to call report no answer was on hold for over 5 minutes. Discharge paperwork sent with [facility] staff.  [name], Cena Helling, RN

## 2023-12-03 NOTE — Inpatient Diabetes Management (Signed)
 Inpatient Diabetes Program Recommendations  AACE/ADA: New Consensus Statement on Inpatient Glycemic Control   Target Ranges:  Prepandial:   less than 140 mg/dL      Peak postprandial:   less than 180 mg/dL (1-2 hours)      Critically ill patients:  140 - 180 mg/dL    Latest Reference Range & Units 12/02/23 06:17 12/02/23 11:32 12/02/23 17:22 12/02/23 21:24 12/03/23 06:09  Glucose-Capillary 70 - 99 mg/dL 835 (H) 739 (H) 859 (H) 226 (H) 231 (H)    Review of Glycemic Control  Diabetes history: DM2 Last A1C 8.1% - Saw Jones, PA with [facility] on 09/29/23.   Outpatient Diabetes medications:  Synjardy XR 10/1000mg  daily Novolog  10 units TID Tresiba  10 units daily, FSL3 (called pt to confirm)   Current orders for Inpatient glycemic control:  Lantus  20 units daily Novolog  0-15 units TID  Solu- Cortef  10mg  daily in the morning and 5mg  in the evening  Inpatient Diabetes Program Recommendations:  Please consider increasing Lantus  to 24 units daily.   Thanks,  Lavanda Search, RN, MSN, [facility]  Inpatient Diabetes Coordinator  Pager [phone] (8a-5p)

## 2023-12-03 NOTE — Plan of Care (Signed)

## 2023-12-03 NOTE — Progress Notes (Signed)
 PROGRESS NOTE    Andrea Wade  FMW:[id number] DOB: [birth year] DOA: 12/01/2023 PCP: Venancio Pock, PA-C  Chief Complaint  Patient presents with   Code Stroke    Brief Narrative:   Andrea Wade is Andrea Wade 53 y.o. female with medical history significant of multiple sclerosis, seizure disorder on Depakote  and Topamax , history of CVA, Crohn's disease, hypothyroidism, chronic migraines, chronic back pain, narcotic dependence, diabetes mellitus type 2, anxiety, depression, and rheumatoid arthritis who presented with new onset of right-sided facial droop, decreased sensation in bilateral legs, and delayed speech.   She experienced acute onset of slurred speech, Andrea Wade sensation of tongue swelling, and Andrea Wade feeling of her throat closing. These symptoms were accompanied by arm and leg weakness, sweating, nausea, and Andrea Wade general feeling of being unwell. The symptoms began after 10 AM today.  She was in her normal state of health when she woke up this morning.  Denies having any similar symptoms after Andrea Wade seizure before.   She has Andrea Wade history of seizures, with the last possible seizure occurring on 9/21 when she was found on the floor unconscious by her husband at their home and was subsequently taken to [facility].  Review of records note workup including MRI of the brain, cervical spine T-spine, and L-spine that did not show any acute cause for her weakness.  Routine EEG showed no elliptic form activity at baseline patient appears patient underwent extensive is bedbound.  She was transferred to [facility] on 9/25-9/30 prior to being discharged to to the rehab facility yesterday.   In the emergency department patient was seen as Andrea Wade code stroke.  CT scan of the head did not reveal any acute abnormality.  Patient was not [name] thrombolytics candidate due to clear cause of symptoms.  X-rays of the hip noted no acute abnormality. Vital signs were noted to be stable.  Labs noted sodium 133 and glucose  214.  Neurology recommended completion of stroke workup.  Patient had been given morphine  point milligrams IV and Zofran  4 mg IV.  Assessment & Plan:   Principal Problem:   Stroke-like symptoms Active Problems:   Multiple sclerosis   Chronic pain   DM2 (diabetes mellitus, type 2) (HCC)   Essential hypertension   Seizure (HCC)   Rheumatoid arthritis   Addison's disease (HCC)   Hypothyroidism   Anxiety and depression   GERD (gastroesophageal reflux disease)  Stroke-like symptoms Patient presents with acute onset of right-sided facial droop, changes in sensation in her lower extremities, and change in speech.  Initial CT scan of the head did not reveal any acute abnormality.  Patient was not thought to be Andrea Wade candidate for thrombolytics due to unclear cause of symptom..  Patient had extensive workup including MRIs and EEG just recently at [facility] and subsequently evaluated at [facility].  She was just discharged on the day prior to admission with thought symptoms caused by Andrea Wade functional neurologic disorder.   - MRI brain without acute infarct, encephalomalacia in the posterior L temporal lobe (patient terminated exam before completion, no IV contrast given) -  she had MRI C/T/L spine at [facility] on 9/23 (T spine notable for normal spinal cord, mild degenerative changes in upper thoracic spine in upper thoracic spine with L foraminal narrowing - chronic deformity of T2 endplate.  Multilevel degenerative changes in lumbar spine, most prominent to L L4-5 and R L3-4.  C spine with mild degenerative changes without significant foraminal or canal stenosis.  -  note per recent discharge summary, her bilateral LE weakness in 2023 was worked up extensively with normal NCS/EMG/US .   - Appreciate neurology consultative services, will follow-up for any further recommendations.  They've signed off today.  Noted sedation related to meds may have played Andrea Wade role.  Continue depakote  and  aspirin .  - Therapy recommending SNF Recommending MRI with/without.   - would recommend continued neurology follow up outpatient   Multiple sclerosis Chronic pain - Continue duloxetine , nortriptyline , fentanyl  patch, baclofen  as needed, oxycodone  as needed   Uncontrolled diabetes mellitus type 2, with long-term use of insulin  Last available hemoglobin A1c noted to be 7.9 when checked on 9/26. - Hypoglycemic protocols - increase basal to 24 units - CBGs before every meal with moderate SSI - Adjust insulin  regimen as deemed medically appropriate   Hypertension BP ok today Continue to hold lisinopril  for now   Seizure disorder Patient thinks last possible seizure was possibly on 9/21 when she was found on the floor unconscious by her husband  - Continue Depakote  - she reports she's not taking topamax    Rheumatoid arthritis Hold methotrexate, she's not taking this per med rec   Addison's disease - Continue hydrocortisone    Hypothyroidism - TSH wnl - Continue levothyroxine   Anxiety and depression - Continue duloxetine    GERD - Continue PPI    DVT prophylaxis: lovenox  Code Status: full Family Communication: noen Disposition:   Status is: Observation The patient remains OBS appropriate and will d/c before 2 midnights.   Consultants:  neurology  Procedures:  none  Antimicrobials:  Anti-infectives (From admission, onward)    None       Subjective: Symptoms persistent   Objective: Vitals:   12/02/23 2359 12/03/23 0410 12/03/23 0746 12/03/23 1104  BP: 124/89 117/73 103/70 118/71  Pulse: 74 72 74 79  Resp: 18 18 16 18   Temp: (!) 97 F (36.1 C) 98 F (36.7 C) 98.4 F (36.9 C) 98.4 F (36.9 C)  TempSrc:  Oral Oral   SpO2: 95% 95% 96% 96%  Weight:      Height:        Intake/Output Summary (Last 24 hours) at 12/03/2023 1345 Last data filed at 12/03/2023 0612 Gross per 24 hour  Intake --  Output 2150 ml  Net -2150 ml   Filed Weights   12/01/23  1200  Weight: 92 kg    Examination:  General: No acute distress. Cardiovascular: RRR Lungs:  unlabored Abdomen: Soft, nontender, nondistended  Neurological: bilateral LE weakness, RUE weakness, R facial droop - poor effort (was able to pick up phone with RUE later without noted difficulty) Extremities: No clubbing or cyanosis. No edema.  Data Reviewed: I have personally reviewed following labs and imaging studies  CBC: Recent Labs  Lab 12/01/23 1250 12/01/23 1255 12/03/23 0148  WBC  --  7.3 7.7  NEUTROABS  --  3.3 3.4  HGB 15.3* 14.1 12.5  HCT 45.0 44.5 38.8  MCV  --  87.8 87.6  PLT  --  217 194    Basic Metabolic Panel: Recent Labs  Lab 12/01/23 1250 12/01/23 1255 12/03/23 0148  NA 136 133* 134*  K 4.4 4.4 4.3  CL 102 99 101  CO2  --  22 23  GLUCOSE 221* 214* 234*  BUN 20 20 24*  CREATININE 0.90 0.91 0.91  CALCIUM   --  9.0 8.7*  MG  --   --  1.7  PHOS  --   --  4.4    GFR:  Estimated Creatinine Clearance: 85.4 mL/min (by C-G formula based on SCr of 0.91 mg/dL).  Liver Function Tests: Recent Labs  Lab 12/01/23 1255 12/03/23 0148  AST 20 27  ALT 16 20  ALKPHOS 60 55  BILITOT 0.8 0.6  PROT 7.6 6.8  ALBUMIN 3.1* 2.6*    CBG: Recent Labs  Lab 12/02/23 1132 12/02/23 1722 12/02/23 2124 12/03/23 0609 12/03/23 1253  GLUCAP 260* 140* 226* 231* 284*     No results found for this or any previous visit (from the past 240 hours).       Radiology Studies: MR BRAIN WO CONTRAST Result Date: 12/02/2023 EXAM: MRI BRAIN WITHOUT CONTRAST 12/02/2023 06:14:01 PM TECHNIQUE: Multiplanar multisequence MRI of the head/brain was performed without the administration of intravenous contrast. The patient terminated the examination prior to completion. Diffusion-weighted imaging, sagittal T1-weighted imaging, and axial T2-weighted imaging were obtained. COMPARISON: Brain MRI dated 11/22/2023. CLINICAL HISTORY: Neuro deficit, acute, stroke suspected. FINDINGS: BRAIN  AND VENTRICLES: No acute infarct. Unchanged encephalomalacia in the posterior left temporal lobe. No intracranial hemorrhage. No mass. No midline shift. No hydrocephalus. The sella is unremarkable. Normal flow voids. ORBITS: No acute abnormality. SINUSES AND MASTOIDS: No acute abnormality. BONES AND SOFT TISSUES: Normal marrow signal. No acute soft tissue abnormality. IMPRESSION: 1. No acute infarct. 2. Unchanged encephalomalacia in the posterior left temporal lobe. 3. Truncated examination Electronically signed by: Franky Stanford MD 12/02/2023 09:04 PM EDT RP Workstation: [id number]   DG Hip Unilat W or Wo Pelvis 2-3 Views Right Result Date: 12/01/2023 CLINICAL DATA:  Hip pain. EXAM: DG HIP (WITH OR WITHOUT PELVIS) 2-3V RIGHT COMPARISON:  12/16/2022 FINDINGS: Mild diffuse decreased bone mineralization. 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 No acute fracture or dislocation evidence of previous bilateral tubal ligation. Multiple pelvic phleboliths. IMPRESSION: 1. No acute findings. 2. 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 Electronically Signed   By: Toribio Agreste M.D.   On: 12/01/2023 15:44        Scheduled Meds:  aspirin  EC  81 mg Oral Daily   divalproex   1,000 mg Oral QHS   DULoxetine   60 mg Oral BID   enoxaparin  (LOVENOX ) injection  40 mg Subcutaneous Q24H   famotidine   20 mg Oral QHS   feeding supplement  237 mL Oral BID BM   [START ON 12/04/2023] fentaNYL   1 patch Transdermal Q72H   hydrocortisone   10 mg Oral Daily   And   hydrocortisone   5 mg Oral QPM   insulin  aspart  0-15 Units Subcutaneous TID WC   [START ON 12/04/2023] insulin  glargine  24 Units Subcutaneous Daily   levothyroxine  50 mcg Oral Q0600   nortriptyline   75 mg Oral QHS   pantoprazole   40 mg Oral Daily   rosuvastatin   5 mg Oral QHS   Continuous Infusions:     LOS: 0 days    Time spent: over 30 min     Meliton Monte, MD [facility]   To contact the attending provider between 7A-7P or the covering provider during after hours 7P-7A, please log into the web site [email or web address] and access using universal [place] password for that web site. If you do not have the password, please call the hospital operator.  12/03/2023, 1:45 PM

## 2023-12-03 NOTE — Plan of Care (Signed)
 MRI brain: 1. No acute infarct. 2. Unchanged encephalomalacia in the posterior left temporal lobe. 3. Truncated examination  Neurohospitalist service will sign off. Please call if there are additional questions.   Electronically signed: Dr. Molina Hollenback

## 2023-12-06 DIAGNOSIS — I951 Orthostatic hypotension: Secondary | ICD-10-CM | POA: Diagnosis not present

## 2023-12-06 DIAGNOSIS — M329 Systemic lupus erythematosus, unspecified: Secondary | ICD-10-CM | POA: Diagnosis not present

## 2023-12-06 DIAGNOSIS — E2740 Unspecified adrenocortical insufficiency: Secondary | ICD-10-CM | POA: Diagnosis not present

## 2023-12-06 DIAGNOSIS — E271 Primary adrenocortical insufficiency: Secondary | ICD-10-CM | POA: Diagnosis not present

## 2023-12-06 DIAGNOSIS — G894 Chronic pain syndrome: Secondary | ICD-10-CM | POA: Diagnosis not present

## 2023-12-06 DIAGNOSIS — G43709 Chronic migraine without aura, not intractable, without status migrainosus: Secondary | ICD-10-CM | POA: Diagnosis not present

## 2023-12-06 DIAGNOSIS — E038 Other specified hypothyroidism: Secondary | ICD-10-CM | POA: Diagnosis not present

## 2023-12-06 DIAGNOSIS — E43 Unspecified severe protein-calorie malnutrition: Secondary | ICD-10-CM | POA: Diagnosis not present

## 2023-12-06 DIAGNOSIS — E785 Hyperlipidemia, unspecified: Secondary | ICD-10-CM | POA: Diagnosis not present

## 2023-12-08 DIAGNOSIS — G894 Chronic pain syndrome: Secondary | ICD-10-CM | POA: Diagnosis not present

## 2023-12-15 DIAGNOSIS — E119 Type 2 diabetes mellitus without complications: Secondary | ICD-10-CM | POA: Diagnosis not present

## 2023-12-27 DIAGNOSIS — M329 Systemic lupus erythematosus, unspecified: Secondary | ICD-10-CM | POA: Diagnosis not present

## 2023-12-27 DIAGNOSIS — E785 Hyperlipidemia, unspecified: Secondary | ICD-10-CM | POA: Diagnosis not present

## 2023-12-27 DIAGNOSIS — G43709 Chronic migraine without aura, not intractable, without status migrainosus: Secondary | ICD-10-CM | POA: Diagnosis not present

## 2023-12-27 DIAGNOSIS — E119 Type 2 diabetes mellitus without complications: Secondary | ICD-10-CM | POA: Diagnosis not present

## 2023-12-27 DIAGNOSIS — E2740 Unspecified adrenocortical insufficiency: Secondary | ICD-10-CM | POA: Diagnosis not present

## 2023-12-27 DIAGNOSIS — G35B1 Active primary progressive multiple sclerosis: Secondary | ICD-10-CM | POA: Diagnosis not present

## 2023-12-27 DIAGNOSIS — G894 Chronic pain syndrome: Secondary | ICD-10-CM | POA: Diagnosis not present

## 2023-12-27 DIAGNOSIS — E271 Primary adrenocortical insufficiency: Secondary | ICD-10-CM | POA: Diagnosis not present

## 2023-12-27 DIAGNOSIS — K219 Gastro-esophageal reflux disease without esophagitis: Secondary | ICD-10-CM | POA: Diagnosis not present

## 2023-12-29 DIAGNOSIS — G894 Chronic pain syndrome: Secondary | ICD-10-CM | POA: Diagnosis not present

## 2024-02-08 NOTE — Progress Notes (Signed)
 Andrea Wade                                          MRN: [id number]   02/08/2024   The [facility] Quality Team Specialist reviewed this patient medical record for the purposes of chart review for care gap closure. The following were reviewed: chart review for care gap closure-breast cancer screening, diabetic eye exam, glycemic status assessment, and kidney health evaluation for diabetes:eGFR  and uACR.    [facility] Quality Team

## 2024-02-21 ENCOUNTER — Encounter: Payer: Self-pay | Admitting: *Deleted

## 2024-02-21 NOTE — Progress Notes (Signed)
 Andrea Wade                                          MRN: [id number]   02/21/2024   The [facility] Quality Team Specialist reviewed this patient medical record for the purposes of chart review for care gap closure. The following were reviewed: chart review for care gap closure-breast cancer screening and kidney health evaluation for diabetes:eGFR  and uACR.    [facility]I Quality Team
